# Patient Record
Sex: Male | Born: 1965 | Race: White | Hispanic: No | Smoking: Former smoker
Health system: Southern US, Community
[De-identification: ages and names within clinical notes are randomized; demographics above are authoritative.]

## PROBLEM LIST (undated history)

## (undated) DIAGNOSIS — F419 Anxiety disorder, unspecified: Secondary | ICD-10-CM

## (undated) DIAGNOSIS — K429 Umbilical hernia without obstruction or gangrene: Secondary | ICD-10-CM

## (undated) DIAGNOSIS — F1910 Other psychoactive substance abuse, uncomplicated: Secondary | ICD-10-CM

## (undated) DIAGNOSIS — G4700 Insomnia, unspecified: Secondary | ICD-10-CM

## (undated) DIAGNOSIS — R51 Headache: Secondary | ICD-10-CM

## (undated) DIAGNOSIS — F32A Depression, unspecified: Secondary | ICD-10-CM

## (undated) DIAGNOSIS — E781 Pure hyperglyceridemia: Secondary | ICD-10-CM

## (undated) DIAGNOSIS — K760 Fatty (change of) liver, not elsewhere classified: Secondary | ICD-10-CM

## (undated) DIAGNOSIS — F329 Major depressive disorder, single episode, unspecified: Secondary | ICD-10-CM

## (undated) DIAGNOSIS — R569 Unspecified convulsions: Secondary | ICD-10-CM

## (undated) DIAGNOSIS — K648 Other hemorrhoids: Secondary | ICD-10-CM

## (undated) DIAGNOSIS — F1010 Alcohol abuse, uncomplicated: Secondary | ICD-10-CM

## (undated) DIAGNOSIS — K449 Diaphragmatic hernia without obstruction or gangrene: Secondary | ICD-10-CM

## (undated) DIAGNOSIS — I10 Essential (primary) hypertension: Secondary | ICD-10-CM

## (undated) DIAGNOSIS — K219 Gastro-esophageal reflux disease without esophagitis: Secondary | ICD-10-CM

## (undated) DIAGNOSIS — K589 Irritable bowel syndrome without diarrhea: Secondary | ICD-10-CM

## (undated) DIAGNOSIS — M1990 Unspecified osteoarthritis, unspecified site: Secondary | ICD-10-CM

## (undated) DIAGNOSIS — J45909 Unspecified asthma, uncomplicated: Secondary | ICD-10-CM

## (undated) DIAGNOSIS — R519 Headache, unspecified: Secondary | ICD-10-CM

## (undated) DIAGNOSIS — K852 Alcohol induced acute pancreatitis without necrosis or infection: Secondary | ICD-10-CM

## (undated) DIAGNOSIS — K5790 Diverticulosis of intestine, part unspecified, without perforation or abscess without bleeding: Secondary | ICD-10-CM

## (undated) DIAGNOSIS — R7303 Prediabetes: Principal | ICD-10-CM

## (undated) HISTORY — DX: Umbilical hernia without obstruction or gangrene: K42.9

## (undated) HISTORY — DX: Depression, unspecified: F32.A

## (undated) HISTORY — DX: Anxiety disorder, unspecified: F41.9

## (undated) HISTORY — DX: Other psychoactive substance abuse, uncomplicated: F19.10

## (undated) HISTORY — DX: Alcohol induced acute pancreatitis without necrosis or infection: K85.20

## (undated) HISTORY — DX: Unspecified osteoarthritis, unspecified site: M19.90

## (undated) HISTORY — DX: Unspecified asthma, uncomplicated: J45.909

## (undated) HISTORY — DX: Insomnia, unspecified: G47.00

## (undated) HISTORY — DX: Gastro-esophageal reflux disease without esophagitis: K21.9

## (undated) HISTORY — DX: Irritable bowel syndrome, unspecified: K58.9

## (undated) HISTORY — DX: Headache: R51

## (undated) HISTORY — DX: Headache, unspecified: R51.9

## (undated) HISTORY — DX: Diaphragmatic hernia without obstruction or gangrene: K44.9

## (undated) HISTORY — DX: Fatty (change of) liver, not elsewhere classified: K76.0

## (undated) HISTORY — DX: Other hemorrhoids: K64.8

## (undated) HISTORY — DX: Major depressive disorder, single episode, unspecified: F32.9

## (undated) HISTORY — DX: Prediabetes: R73.03

## (undated) HISTORY — DX: Diverticulosis of intestine, part unspecified, without perforation or abscess without bleeding: K57.90

## (undated) HISTORY — DX: Pure hyperglyceridemia: E78.1

## (undated) HISTORY — DX: Alcohol abuse, uncomplicated: F10.10

---

## 1998-08-09 ENCOUNTER — Inpatient Hospital Stay (HOSPITAL_COMMUNITY): Admission: EM | Admit: 1998-08-09 | Discharge: 1998-08-09 | Payer: Self-pay | Admitting: Emergency Medicine

## 2000-03-11 ENCOUNTER — Emergency Department (HOSPITAL_COMMUNITY): Admission: EM | Admit: 2000-03-11 | Discharge: 2000-03-11 | Payer: Self-pay | Admitting: *Deleted

## 2000-08-27 HISTORY — PX: OTHER SURGICAL HISTORY: SHX169

## 2001-09-05 ENCOUNTER — Encounter (INDEPENDENT_AMBULATORY_CARE_PROVIDER_SITE_OTHER): Payer: Self-pay | Admitting: *Deleted

## 2001-09-05 ENCOUNTER — Encounter: Admission: RE | Admit: 2001-09-05 | Discharge: 2001-09-05 | Payer: Self-pay | Admitting: *Deleted

## 2002-07-20 ENCOUNTER — Encounter: Payer: Self-pay | Admitting: Orthopedic Surgery

## 2002-07-20 ENCOUNTER — Inpatient Hospital Stay (HOSPITAL_COMMUNITY): Admission: EM | Admit: 2002-07-20 | Discharge: 2002-07-23 | Payer: Self-pay | Admitting: *Deleted

## 2002-07-21 ENCOUNTER — Encounter: Payer: Self-pay | Admitting: Specialist

## 2002-08-04 ENCOUNTER — Ambulatory Visit (HOSPITAL_BASED_OUTPATIENT_CLINIC_OR_DEPARTMENT_OTHER): Admission: RE | Admit: 2002-08-04 | Discharge: 2002-08-05 | Payer: Self-pay | Admitting: Orthopedic Surgery

## 2002-08-18 ENCOUNTER — Encounter: Payer: Self-pay | Admitting: Orthopedic Surgery

## 2002-08-18 ENCOUNTER — Ambulatory Visit (HOSPITAL_COMMUNITY): Admission: RE | Admit: 2002-08-18 | Discharge: 2002-08-18 | Payer: Self-pay | Admitting: Orthopedic Surgery

## 2002-08-25 ENCOUNTER — Encounter: Admission: RE | Admit: 2002-08-25 | Discharge: 2002-11-23 | Payer: Self-pay | Admitting: Orthopedic Surgery

## 2003-07-10 ENCOUNTER — Emergency Department (HOSPITAL_COMMUNITY): Admission: EM | Admit: 2003-07-10 | Discharge: 2003-07-11 | Payer: Self-pay | Admitting: Emergency Medicine

## 2003-07-20 ENCOUNTER — Emergency Department (HOSPITAL_COMMUNITY): Admission: AD | Admit: 2003-07-20 | Discharge: 2003-07-20 | Payer: Self-pay | Admitting: Family Medicine

## 2003-07-23 ENCOUNTER — Ambulatory Visit (HOSPITAL_COMMUNITY): Admission: RE | Admit: 2003-07-23 | Discharge: 2003-07-23 | Payer: Self-pay | Admitting: *Deleted

## 2003-07-23 ENCOUNTER — Encounter (INDEPENDENT_AMBULATORY_CARE_PROVIDER_SITE_OTHER): Payer: Self-pay | Admitting: *Deleted

## 2003-09-04 ENCOUNTER — Emergency Department (HOSPITAL_COMMUNITY): Admission: AD | Admit: 2003-09-04 | Discharge: 2003-09-04 | Payer: Self-pay | Admitting: Family Medicine

## 2003-09-27 ENCOUNTER — Encounter: Payer: Self-pay | Admitting: Internal Medicine

## 2003-09-27 ENCOUNTER — Encounter (INDEPENDENT_AMBULATORY_CARE_PROVIDER_SITE_OTHER): Payer: Self-pay | Admitting: *Deleted

## 2003-09-27 ENCOUNTER — Ambulatory Visit (HOSPITAL_COMMUNITY): Admission: RE | Admit: 2003-09-27 | Discharge: 2003-09-27 | Payer: Self-pay | Admitting: Internal Medicine

## 2003-11-01 ENCOUNTER — Emergency Department (HOSPITAL_COMMUNITY): Admission: EM | Admit: 2003-11-01 | Discharge: 2003-11-01 | Payer: Self-pay | Admitting: Family Medicine

## 2003-12-04 ENCOUNTER — Emergency Department (HOSPITAL_COMMUNITY): Admission: AD | Admit: 2003-12-04 | Discharge: 2003-12-04 | Payer: Self-pay | Admitting: Family Medicine

## 2004-01-01 ENCOUNTER — Emergency Department (HOSPITAL_COMMUNITY): Admission: EM | Admit: 2004-01-01 | Discharge: 2004-01-01 | Payer: Self-pay | Admitting: *Deleted

## 2004-01-15 ENCOUNTER — Emergency Department (HOSPITAL_COMMUNITY): Admission: EM | Admit: 2004-01-15 | Discharge: 2004-01-15 | Payer: Self-pay | Admitting: Family Medicine

## 2004-07-19 ENCOUNTER — Emergency Department (HOSPITAL_COMMUNITY): Admission: EM | Admit: 2004-07-19 | Discharge: 2004-07-19 | Payer: Self-pay | Admitting: Family Medicine

## 2004-09-23 ENCOUNTER — Emergency Department (HOSPITAL_COMMUNITY): Admission: EM | Admit: 2004-09-23 | Discharge: 2004-09-23 | Payer: Self-pay | Admitting: Family Medicine

## 2004-09-25 ENCOUNTER — Emergency Department (HOSPITAL_COMMUNITY): Admission: EM | Admit: 2004-09-25 | Discharge: 2004-09-25 | Payer: Self-pay | Admitting: Family Medicine

## 2005-01-20 ENCOUNTER — Emergency Department (HOSPITAL_COMMUNITY): Admission: EM | Admit: 2005-01-20 | Discharge: 2005-01-20 | Payer: Self-pay | Admitting: Emergency Medicine

## 2005-01-20 ENCOUNTER — Encounter (INDEPENDENT_AMBULATORY_CARE_PROVIDER_SITE_OTHER): Payer: Self-pay | Admitting: *Deleted

## 2005-01-20 ENCOUNTER — Ambulatory Visit (HOSPITAL_COMMUNITY): Admission: RE | Admit: 2005-01-20 | Discharge: 2005-01-20 | Payer: Self-pay | Admitting: Emergency Medicine

## 2005-03-11 ENCOUNTER — Emergency Department (HOSPITAL_COMMUNITY): Admission: EM | Admit: 2005-03-11 | Discharge: 2005-03-11 | Payer: Self-pay | Admitting: Family Medicine

## 2005-12-26 ENCOUNTER — Encounter: Payer: Self-pay | Admitting: Orthopedic Surgery

## 2007-04-01 ENCOUNTER — Ambulatory Visit (HOSPITAL_BASED_OUTPATIENT_CLINIC_OR_DEPARTMENT_OTHER): Admission: RE | Admit: 2007-04-01 | Discharge: 2007-04-01 | Payer: Self-pay | Admitting: Orthopedic Surgery

## 2008-03-29 ENCOUNTER — Ambulatory Visit: Payer: Self-pay

## 2008-04-01 ENCOUNTER — Emergency Department (HOSPITAL_COMMUNITY): Admission: EM | Admit: 2008-04-01 | Discharge: 2008-04-01 | Payer: Self-pay | Admitting: *Deleted

## 2008-05-05 ENCOUNTER — Encounter: Payer: Self-pay | Admitting: Internal Medicine

## 2008-05-21 DIAGNOSIS — M1990 Unspecified osteoarthritis, unspecified site: Secondary | ICD-10-CM | POA: Insufficient documentation

## 2008-05-21 DIAGNOSIS — Z8639 Personal history of other endocrine, nutritional and metabolic disease: Secondary | ICD-10-CM | POA: Insufficient documentation

## 2008-05-21 DIAGNOSIS — I129 Hypertensive chronic kidney disease with stage 1 through stage 4 chronic kidney disease, or unspecified chronic kidney disease: Secondary | ICD-10-CM

## 2008-05-21 DIAGNOSIS — Z862 Personal history of diseases of the blood and blood-forming organs and certain disorders involving the immune mechanism: Secondary | ICD-10-CM

## 2008-05-21 DIAGNOSIS — K589 Irritable bowel syndrome without diarrhea: Secondary | ICD-10-CM

## 2008-05-21 DIAGNOSIS — F329 Major depressive disorder, single episode, unspecified: Secondary | ICD-10-CM

## 2008-05-21 DIAGNOSIS — K219 Gastro-esophageal reflux disease without esophagitis: Secondary | ICD-10-CM

## 2008-05-21 DIAGNOSIS — E785 Hyperlipidemia, unspecified: Secondary | ICD-10-CM | POA: Insufficient documentation

## 2008-05-24 ENCOUNTER — Ambulatory Visit: Payer: Self-pay | Admitting: Internal Medicine

## 2008-05-24 DIAGNOSIS — R740 Nonspecific elevation of levels of transaminase and lactic acid dehydrogenase [LDH]: Secondary | ICD-10-CM

## 2008-05-25 ENCOUNTER — Ambulatory Visit: Payer: Self-pay | Admitting: Internal Medicine

## 2008-05-25 DIAGNOSIS — K2900 Acute gastritis without bleeding: Secondary | ICD-10-CM | POA: Insufficient documentation

## 2008-05-26 ENCOUNTER — Encounter: Payer: Self-pay | Admitting: Internal Medicine

## 2008-05-26 LAB — CONVERTED CEMR LAB: UREASE: NEGATIVE

## 2009-01-28 ENCOUNTER — Ambulatory Visit: Payer: Self-pay | Admitting: Internal Medicine

## 2009-01-28 DIAGNOSIS — R1011 Right upper quadrant pain: Secondary | ICD-10-CM

## 2009-01-28 DIAGNOSIS — R112 Nausea with vomiting, unspecified: Secondary | ICD-10-CM | POA: Insufficient documentation

## 2009-01-28 LAB — CONVERTED CEMR LAB
Anti Nuclear Antibody(ANA): NEGATIVE
HCV Ab: NEGATIVE
Hepatitis B Surface Ag: NEGATIVE
INR: 0.9 (ref 0.8–1.0)

## 2009-03-09 ENCOUNTER — Ambulatory Visit: Payer: Self-pay | Admitting: Internal Medicine

## 2010-05-07 ENCOUNTER — Ambulatory Visit: Payer: Self-pay | Admitting: Gastroenterology

## 2010-05-07 ENCOUNTER — Inpatient Hospital Stay (HOSPITAL_COMMUNITY): Admission: EM | Admit: 2010-05-07 | Discharge: 2010-05-09 | Payer: Self-pay | Admitting: Emergency Medicine

## 2010-05-15 ENCOUNTER — Inpatient Hospital Stay (HOSPITAL_COMMUNITY)
Admission: EM | Admit: 2010-05-15 | Discharge: 2010-05-25 | Payer: Self-pay | Source: Home / Self Care | Admitting: Emergency Medicine

## 2010-07-28 ENCOUNTER — Emergency Department (HOSPITAL_COMMUNITY)
Admission: EM | Admit: 2010-07-28 | Discharge: 2010-07-29 | Payer: Self-pay | Source: Home / Self Care | Admitting: Emergency Medicine

## 2010-08-04 ENCOUNTER — Emergency Department (HOSPITAL_COMMUNITY)
Admission: EM | Admit: 2010-08-04 | Discharge: 2010-08-04 | Disposition: A | Discharge status: Other Acute Inpatient Hospital | Payer: Self-pay | Source: Home / Self Care | Admitting: Emergency Medicine

## 2010-08-04 ENCOUNTER — Inpatient Hospital Stay (HOSPITAL_COMMUNITY)
Admission: EM | Admit: 2010-08-04 | Discharge: 2010-08-06 | Payer: Self-pay | Source: Home / Self Care | Attending: Internal Medicine | Admitting: Internal Medicine

## 2010-08-27 DIAGNOSIS — F1010 Alcohol abuse, uncomplicated: Secondary | ICD-10-CM

## 2010-08-27 HISTORY — DX: Alcohol abuse, uncomplicated: F10.10

## 2010-10-19 ENCOUNTER — Emergency Department (HOSPITAL_COMMUNITY)
Admission: EM | Admit: 2010-10-19 | Discharge: 2010-10-20 | Disposition: A | Discharge status: Home / Self Care | Payer: Self-pay | Attending: Emergency Medicine | Admitting: Emergency Medicine

## 2010-10-19 DIAGNOSIS — K029 Dental caries, unspecified: Secondary | ICD-10-CM | POA: Insufficient documentation

## 2010-10-19 DIAGNOSIS — I10 Essential (primary) hypertension: Secondary | ICD-10-CM | POA: Insufficient documentation

## 2010-10-19 DIAGNOSIS — R197 Diarrhea, unspecified: Secondary | ICD-10-CM | POA: Insufficient documentation

## 2010-10-19 DIAGNOSIS — K089 Disorder of teeth and supporting structures, unspecified: Secondary | ICD-10-CM | POA: Insufficient documentation

## 2010-10-19 DIAGNOSIS — R10812 Left upper quadrant abdominal tenderness: Secondary | ICD-10-CM | POA: Insufficient documentation

## 2010-10-19 DIAGNOSIS — R109 Unspecified abdominal pain: Secondary | ICD-10-CM | POA: Insufficient documentation

## 2010-10-19 DIAGNOSIS — R945 Abnormal results of liver function studies: Secondary | ICD-10-CM | POA: Insufficient documentation

## 2010-10-19 DIAGNOSIS — R112 Nausea with vomiting, unspecified: Secondary | ICD-10-CM | POA: Insufficient documentation

## 2010-10-19 DIAGNOSIS — Z79899 Other long term (current) drug therapy: Secondary | ICD-10-CM | POA: Insufficient documentation

## 2010-10-19 DIAGNOSIS — R220 Localized swelling, mass and lump, head: Secondary | ICD-10-CM | POA: Insufficient documentation

## 2010-10-19 DIAGNOSIS — F411 Generalized anxiety disorder: Secondary | ICD-10-CM | POA: Insufficient documentation

## 2010-10-19 DIAGNOSIS — K219 Gastro-esophageal reflux disease without esophagitis: Secondary | ICD-10-CM | POA: Insufficient documentation

## 2010-10-20 LAB — ETHANOL: Alcohol, Ethyl (B): <5 mg/dL (ref 0–10)

## 2010-10-20 LAB — CBC
MCHC: 34.8 g/dL (ref 30.0–36.0)
MCV: 90.4 fL (ref 78.0–100.0)
Platelets: 351 10*3/uL (ref 150–400)
RDW: 12.9 % (ref 11.5–15.5)
WBC: 5.7 10*3/uL (ref 4.0–10.5)

## 2010-10-20 LAB — COMPREHENSIVE METABOLIC PANEL
Albumin: 3.5 g/dL (ref 3.5–5.2)
BUN: 17 mg/dL (ref 6–23)
Calcium: 9 mg/dL (ref 8.4–10.5)
Creatinine, Ser: 1.21 mg/dL (ref 0.4–1.5)
Potassium: 4.1 mEq/L (ref 3.5–5.1)
Total Protein: 6.4 g/dL (ref 6.0–8.3)

## 2010-10-20 LAB — LIPASE, BLOOD: Lipase: 43 U/L (ref 11–59)

## 2010-10-20 LAB — DIFFERENTIAL
Eosinophils Absolute: 0.1 10*3/uL (ref 0.0–0.7)
Eosinophils Relative: 2 % (ref 0–5)
Lymphs Abs: 2.1 10*3/uL (ref 0.7–4.0)
Monocytes Absolute: 0.6 10*3/uL (ref 0.1–1.0)

## 2010-11-06 LAB — DIFFERENTIAL
Basophils Absolute: 0 10*3/uL (ref 0.0–0.1)
Basophils Relative: 1 % (ref 0–1)
Eosinophils Absolute: 0.2 10*3/uL (ref 0.0–0.7)
Eosinophils Relative: 2 % (ref 0–5)
Lymphocytes Relative: 17 % (ref 12–46)
Lymphocytes Relative: 32 % (ref 12–46)
Lymphs Abs: 2.3 10*3/uL (ref 0.7–4.0)
Monocytes Absolute: 0.6 10*3/uL (ref 0.1–1.0)
Monocytes Absolute: 0.8 10*3/uL (ref 0.1–1.0)
Neutro Abs: 3.1 10*3/uL (ref 1.7–7.7)
Neutrophils Relative %: 65 % (ref 43–77)

## 2010-11-06 LAB — COMPREHENSIVE METABOLIC PANEL
ALT: 36 U/L (ref 0–53)
ALT: 704 U/L — ABNORMAL HIGH (ref 0–53)
AST: 1445 U/L — ABNORMAL HIGH (ref 0–37)
AST: 45 U/L — ABNORMAL HIGH (ref 0–37)
Albumin: 3.9 g/dL (ref 3.5–5.2)
Alkaline Phosphatase: 78 U/L (ref 39–117)
BUN: 6 mg/dL (ref 6–23)
BUN: 9 mg/dL (ref 6–23)
CO2: 25 mEq/L (ref 19–32)
CO2: 25 mEq/L (ref 19–32)
CO2: 26 mEq/L (ref 19–32)
CO2: 27 mEq/L (ref 19–32)
Calcium: 8.3 mg/dL — ABNORMAL LOW (ref 8.4–10.5)
Calcium: 9.2 mg/dL (ref 8.4–10.5)
Chloride: 106 mEq/L (ref 96–112)
Chloride: 108 mEq/L (ref 96–112)
Chloride: 108 mEq/L (ref 96–112)
Creatinine, Ser: 1.08 mg/dL (ref 0.4–1.5)
Creatinine, Ser: 1.17 mg/dL (ref 0.4–1.5)
Creatinine, Ser: 1.22 mg/dL (ref 0.4–1.5)
GFR calc Af Amer: >60 mL/min (ref >60)
GFR calc Af Amer: >60 mL/min (ref >60)
GFR calc non Af Amer: >60 mL/min (ref >60)
GFR calc non Af Amer: >60 mL/min (ref >60)
GFR calc non Af Amer: >60 mL/min (ref >60)
Glucose, Bld: 114 mg/dL — ABNORMAL HIGH (ref 70–99)
Potassium: 3.8 mEq/L (ref 3.5–5.1)
Potassium: 4.5 mEq/L (ref 3.5–5.1)
Sodium: 142 mEq/L (ref 135–145)
Sodium: 144 mEq/L (ref 135–145)
Total Bilirubin: 0.6 mg/dL (ref 0.3–1.2)
Total Bilirubin: 1.4 mg/dL — ABNORMAL HIGH (ref 0.3–1.2)
Total Bilirubin: 1.6 mg/dL — ABNORMAL HIGH (ref 0.3–1.2)
Total Protein: 5.6 g/dL — ABNORMAL LOW (ref 6.0–8.3)

## 2010-11-06 LAB — CBC
HCT: 38.6 % — ABNORMAL LOW (ref 39.0–52.0)
HCT: 40.4 % (ref 39.0–52.0)
Hemoglobin: 13.2 g/dL (ref 13.0–17.0)
Hemoglobin: 14.9 g/dL (ref 13.0–17.0)
MCH: 31.4 pg (ref 26.0–34.0)
MCH: 32 pg (ref 26.0–34.0)
MCHC: 34.2 g/dL (ref 30.0–36.0)
MCV: 92 fL (ref 78.0–100.0)
MCV: 93.5 fL (ref 78.0–100.0)
Platelets: 334 10*3/uL (ref 150–400)
Platelets: 339 K/uL (ref 150–400)
RBC: 4.13 MIL/uL — ABNORMAL LOW (ref 4.22–5.81)
RBC: 4.39 MIL/uL (ref 4.22–5.81)
RBC: 4.59 MIL/uL (ref 4.22–5.81)
RDW: 13.7 % (ref 11.5–15.5)
WBC: 4.6 K/uL (ref 4.0–10.5)
WBC: 4.8 10*3/uL (ref 4.0–10.5)

## 2010-11-06 LAB — RAPID URINE DRUG SCREEN, HOSP PERFORMED
Amphetamines: NOT DETECTED
Barbiturates: NOT DETECTED
Tetrahydrocannabinol: NOT DETECTED

## 2010-11-06 LAB — URINALYSIS, ROUTINE W REFLEX MICROSCOPIC
Hgb urine dipstick: NEGATIVE
Nitrite: NEGATIVE
Nitrite: NEGATIVE
Protein, ur: NEGATIVE mg/dL
Specific Gravity, Urine: 1.009 (ref 1.005–1.030)
Urobilinogen, UA: 0.2 mg/dL (ref 0.0–1.0)
Urobilinogen, UA: 1 mg/dL (ref 0.0–1.0)
pH: 7 (ref 5.0–8.0)

## 2010-11-06 LAB — LIPASE, BLOOD
Lipase: 33 U/L (ref 11–59)
Lipase: 43 U/L (ref 11–59)

## 2010-11-06 LAB — MRSA PCR SCREENING: MRSA by PCR: NEGATIVE

## 2010-11-09 LAB — GLUCOSE, CAPILLARY
Glucose-Capillary: 112 mg/dL — ABNORMAL HIGH (ref 70–99)
Glucose-Capillary: 115 mg/dL — ABNORMAL HIGH (ref 70–99)
Glucose-Capillary: 115 mg/dL — ABNORMAL HIGH (ref 70–99)
Glucose-Capillary: 115 mg/dL — ABNORMAL HIGH (ref 70–99)
Glucose-Capillary: 121 mg/dL — ABNORMAL HIGH (ref 70–99)
Glucose-Capillary: 125 mg/dL — ABNORMAL HIGH (ref 70–99)
Glucose-Capillary: 132 mg/dL — ABNORMAL HIGH (ref 70–99)
Glucose-Capillary: 139 mg/dL — ABNORMAL HIGH (ref 70–99)
Glucose-Capillary: 140 mg/dL — ABNORMAL HIGH (ref 70–99)
Glucose-Capillary: 141 mg/dL — ABNORMAL HIGH (ref 70–99)
Glucose-Capillary: 148 mg/dL — ABNORMAL HIGH (ref 70–99)
Glucose-Capillary: 148 mg/dL — ABNORMAL HIGH (ref 70–99)
Glucose-Capillary: 149 mg/dL — ABNORMAL HIGH (ref 70–99)
Glucose-Capillary: 156 mg/dL — ABNORMAL HIGH (ref 70–99)
Glucose-Capillary: 99 mg/dL (ref 70–99)

## 2010-11-09 LAB — CBC
HCT: 29.9 % — ABNORMAL LOW (ref 39.0–52.0)
HCT: 33.7 % — ABNORMAL LOW (ref 39.0–52.0)
HCT: 36.3 % — ABNORMAL LOW (ref 39.0–52.0)
HCT: 36.3 % — ABNORMAL LOW (ref 39.0–52.0)
HCT: 45 % (ref 39.0–52.0)
Hemoglobin: 10 g/dL — ABNORMAL LOW (ref 13.0–17.0)
Hemoglobin: 11.3 g/dL — ABNORMAL LOW (ref 13.0–17.0)
Hemoglobin: 12.3 g/dL — ABNORMAL LOW (ref 13.0–17.0)
Hemoglobin: 14.7 g/dL (ref 13.0–17.0)
Hemoglobin: 12.6 g/dL — ABNORMAL LOW (ref 13.0–17.0)
Hemoglobin: 15.9 g/dL (ref 13.0–17.0)
MCH: 31.7 pg (ref 26.0–34.0)
MCH: 32.1 pg (ref 26.0–34.0)
MCH: 32.3 pg (ref 26.0–34.0)
MCH: 32.6 pg (ref 26.0–34.0)
MCH: 32.7 pg (ref 26.0–34.0)
MCH: 33.1 pg (ref 26.0–34.0)
MCH: 33.2 pg (ref 26.0–34.0)
MCH: 33.2 pg (ref 26.0–34.0)
MCH: 33.5 pg (ref 26.0–34.0)
MCHC: 33 g/dL (ref 30.0–36.0)
MCHC: 33.4 g/dL (ref 30.0–36.0)
MCHC: 33.4 g/dL (ref 30.0–36.0)
MCHC: 33.6 g/dL (ref 30.0–36.0)
MCHC: 33.7 g/dL (ref 30.0–36.0)
MCHC: 33.9 g/dL (ref 30.0–36.0)
MCHC: 35.3 g/dL (ref 30.0–36.0)
MCV: 96.3 fL (ref 78.0–100.0)
MCV: 96.5 fL (ref 78.0–100.0)
MCV: 97.7 fL (ref 78.0–100.0)
MCV: 98.8 fL (ref 78.0–100.0)
MCV: 95 fL (ref 78.0–100.0)
Platelets: 170 10*3/uL (ref 150–400)
Platelets: 201 10*3/uL (ref 150–400)
Platelets: 231 10*3/uL (ref 150–400)
Platelets: 253 10*3/uL (ref 150–400)
Platelets: 255 10*3/uL (ref 150–400)
Platelets: 305 10*3/uL (ref 150–400)
Platelets: 582 10*3/uL — ABNORMAL HIGH (ref 150–400)
Platelets: 714 10*3/uL — ABNORMAL HIGH (ref 150–400)
Platelets: 228 10*3/uL (ref 150–400)
RBC: 3 MIL/uL — ABNORMAL LOW (ref 4.22–5.81)
RBC: 3.22 MIL/uL — ABNORMAL LOW (ref 4.22–5.81)
RBC: 3.41 MIL/uL — ABNORMAL LOW (ref 4.22–5.81)
RBC: 3.43 MIL/uL — ABNORMAL LOW (ref 4.22–5.81)
RBC: 3.6 MIL/uL — ABNORMAL LOW (ref 4.22–5.81)
RBC: 3.76 MIL/uL — ABNORMAL LOW (ref 4.22–5.81)
RBC: 4.42 MIL/uL (ref 4.22–5.81)
RBC: 3.92 MIL/uL — ABNORMAL LOW (ref 4.22–5.81)
RDW: 12.2 % (ref 11.5–15.5)
RDW: 12.3 % (ref 11.5–15.5)
RDW: 12.4 % (ref 11.5–15.5)
RDW: 12.5 % (ref 11.5–15.5)
RDW: 12.5 % (ref 11.5–15.5)
RDW: 12.7 % (ref 11.5–15.5)
RDW: 11.9 % (ref 11.5–15.5)
RDW: 12 % (ref 11.5–15.5)
RDW: 12.1 % (ref 11.5–15.5)
WBC: 12.6 10*3/uL — ABNORMAL HIGH (ref 4.0–10.5)
WBC: 13.4 10*3/uL — ABNORMAL HIGH (ref 4.0–10.5)
WBC: 5.6 10*3/uL (ref 4.0–10.5)
WBC: 7.6 10*3/uL (ref 4.0–10.5)
WBC: 8.7 10*3/uL (ref 4.0–10.5)
WBC: 3.9 10*3/uL — ABNORMAL LOW (ref 4.0–10.5)

## 2010-11-09 LAB — COMPREHENSIVE METABOLIC PANEL
ALT: 101 U/L — ABNORMAL HIGH (ref 0–53)
ALT: 201 U/L — ABNORMAL HIGH (ref 0–53)
AST: 129 U/L — ABNORMAL HIGH (ref 0–37)
AST: 132 U/L — ABNORMAL HIGH (ref 0–37)
AST: 210 U/L — ABNORMAL HIGH (ref 0–37)
AST: 22 U/L (ref 0–37)
AST: 50 U/L — ABNORMAL HIGH (ref 0–37)
AST: 65 U/L — ABNORMAL HIGH (ref 0–37)
AST: 70 U/L — ABNORMAL HIGH (ref 0–37)
AST: 400 U/L — ABNORMAL HIGH (ref 0–37)
Albumin: 2.2 g/dL — ABNORMAL LOW (ref 3.5–5.2)
Albumin: 2.3 g/dL — ABNORMAL LOW (ref 3.5–5.2)
Albumin: 2.4 g/dL — ABNORMAL LOW (ref 3.5–5.2)
Albumin: 2.5 g/dL — ABNORMAL LOW (ref 3.5–5.2)
Albumin: 2.5 g/dL — ABNORMAL LOW (ref 3.5–5.2)
Albumin: 2.8 g/dL — ABNORMAL LOW (ref 3.5–5.2)
Albumin: 3.5 g/dL (ref 3.5–5.2)
Albumin: 3.1 g/dL — ABNORMAL LOW (ref 3.5–5.2)
Alkaline Phosphatase: 140 U/L — ABNORMAL HIGH (ref 39–117)
Alkaline Phosphatase: 195 U/L — ABNORMAL HIGH (ref 39–117)
Alkaline Phosphatase: 76 U/L (ref 39–117)
Alkaline Phosphatase: 90 U/L (ref 39–117)
Alkaline Phosphatase: 94 U/L (ref 39–117)
Alkaline Phosphatase: 94 U/L (ref 39–117)
BUN: 10 mg/dL (ref 6–23)
BUN: 11 mg/dL (ref 6–23)
BUN: 15 mg/dL (ref 6–23)
BUN: 2 mg/dL — ABNORMAL LOW (ref 6–23)
BUN: 3 mg/dL — ABNORMAL LOW (ref 6–23)
BUN: 4 mg/dL — ABNORMAL LOW (ref 6–23)
BUN: 4 mg/dL — ABNORMAL LOW (ref 6–23)
BUN: 9 mg/dL (ref 6–23)
BUN: 10 mg/dL (ref 6–23)
CO2: 26 mEq/L (ref 19–32)
CO2: 28 mEq/L (ref 19–32)
CO2: 31 mEq/L (ref 19–32)
CO2: 32 mEq/L (ref 19–32)
CO2: 32 mEq/L (ref 19–32)
CO2: 28 mEq/L (ref 19–32)
Calcium: 8.3 mg/dL — ABNORMAL LOW (ref 8.4–10.5)
Calcium: 8.5 mg/dL (ref 8.4–10.5)
Calcium: 8.9 mg/dL (ref 8.4–10.5)
Calcium: 8.9 mg/dL (ref 8.4–10.5)
Calcium: 8.9 mg/dL (ref 8.4–10.5)
Calcium: 8.5 mg/dL (ref 8.4–10.5)
Chloride: 101 mEq/L (ref 96–112)
Chloride: 101 mEq/L (ref 96–112)
Chloride: 106 mEq/L (ref 96–112)
Chloride: 107 mEq/L (ref 96–112)
Chloride: 107 mEq/L (ref 96–112)
Chloride: 99 mEq/L (ref 96–112)
Chloride: 99 mEq/L (ref 96–112)
Creatinine, Ser: 0.59 mg/dL (ref 0.4–1.5)
Creatinine, Ser: 0.61 mg/dL (ref 0.4–1.5)
Creatinine, Ser: 0.62 mg/dL (ref 0.4–1.5)
Creatinine, Ser: 0.68 mg/dL (ref 0.4–1.5)
Creatinine, Ser: 0.72 mg/dL (ref 0.4–1.5)
Creatinine, Ser: 0.73 mg/dL (ref 0.4–1.5)
Creatinine, Ser: 0.79 mg/dL (ref 0.4–1.5)
Creatinine, Ser: 0.81 mg/dL (ref 0.4–1.5)
Creatinine, Ser: 1.1 mg/dL (ref 0.4–1.5)
GFR calc Af Amer: >60 mL/min (ref >60)
GFR calc Af Amer: >60 mL/min (ref >60)
GFR calc Af Amer: >60 mL/min (ref >60)
GFR calc Af Amer: >60 mL/min (ref >60)
GFR calc Af Amer: >60 mL/min (ref >60)
GFR calc Af Amer: >60 mL/min (ref >60)
GFR calc Af Amer: >60 mL/min (ref >60)
GFR calc non Af Amer: >60 mL/min (ref >60)
GFR calc non Af Amer: >60 mL/min (ref >60)
GFR calc non Af Amer: >60 mL/min (ref >60)
GFR calc non Af Amer: >60 mL/min (ref >60)
GFR calc non Af Amer: >60 mL/min (ref >60)
GFR calc non Af Amer: >60 mL/min (ref >60)
Glucose, Bld: 107 mg/dL — ABNORMAL HIGH (ref 70–99)
Glucose, Bld: 122 mg/dL — ABNORMAL HIGH (ref 70–99)
Glucose, Bld: 129 mg/dL — ABNORMAL HIGH (ref 70–99)
Glucose, Bld: 144 mg/dL — ABNORMAL HIGH (ref 70–99)
Potassium: 2.9 mEq/L — ABNORMAL LOW (ref 3.5–5.1)
Potassium: 3.6 mEq/L (ref 3.5–5.1)
Potassium: 3.8 mEq/L (ref 3.5–5.1)
Potassium: 3.8 mEq/L (ref 3.5–5.1)
Potassium: 3.9 mEq/L (ref 3.5–5.1)
Potassium: 3.9 mEq/L (ref 3.5–5.1)
Potassium: 3.6 mEq/L (ref 3.5–5.1)
Sodium: 141 mEq/L (ref 135–145)
Sodium: 136 mEq/L (ref 135–145)
Total Bilirubin: 0.3 mg/dL (ref 0.3–1.2)
Total Bilirubin: 0.6 mg/dL (ref 0.3–1.2)
Total Bilirubin: 0.8 mg/dL (ref 0.3–1.2)
Total Bilirubin: 0.9 mg/dL (ref 0.3–1.2)
Total Bilirubin: 1.2 mg/dL (ref 0.3–1.2)
Total Bilirubin: 1.5 mg/dL — ABNORMAL HIGH (ref 0.3–1.2)
Total Bilirubin: 2 mg/dL — ABNORMAL HIGH (ref 0.3–1.2)
Total Protein: 5.3 g/dL — ABNORMAL LOW (ref 6.0–8.3)
Total Protein: 5.3 g/dL — ABNORMAL LOW (ref 6.0–8.3)
Total Protein: 5.5 g/dL — ABNORMAL LOW (ref 6.0–8.3)
Total Protein: 5.6 g/dL — ABNORMAL LOW (ref 6.0–8.3)
Total Protein: 5.7 g/dL — ABNORMAL LOW (ref 6.0–8.3)
Total Protein: 5.8 g/dL — ABNORMAL LOW (ref 6.0–8.3)
Total Protein: 5.6 g/dL — ABNORMAL LOW (ref 6.0–8.3)
Total Protein: 5.8 g/dL — ABNORMAL LOW (ref 6.0–8.3)

## 2010-11-09 LAB — DIFFERENTIAL
Basophils Absolute: 0 10*3/uL (ref 0.0–0.1)
Basophils Relative: 0 % (ref 0–1)
Basophils Relative: 1 % (ref 0–1)
Lymphocytes Relative: 10 % — ABNORMAL LOW (ref 12–46)
Lymphocytes Relative: 17 % (ref 12–46)
Lymphs Abs: 1.5 10*3/uL (ref 0.7–4.0)
Lymphs Abs: 1.7 10*3/uL (ref 0.7–4.0)
Monocytes Relative: 14 % — ABNORMAL HIGH (ref 3–12)
Neutro Abs: 3 10*3/uL (ref 1.7–7.7)
Neutro Abs: 6.8 10*3/uL (ref 1.7–7.7)
Neutrophils Relative %: 54 % (ref 43–77)
Neutrophils Relative %: 63 % (ref 43–77)
Neutrophils Relative %: 74 % (ref 43–77)

## 2010-11-09 LAB — CULTURE, BLOOD (ROUTINE X 2)
Culture  Setup Time: 201109201428
Culture: NO GROWTH
Culture: NO GROWTH

## 2010-11-09 LAB — LIPASE, BLOOD
Lipase: 43 U/L (ref 11–59)
Lipase: 56 U/L (ref 11–59)
Lipase: 53 U/L (ref 11–59)

## 2010-11-09 LAB — POCT I-STAT, CHEM 8
Chloride: 105 mEq/L (ref 96–112)
Creatinine, Ser: 1.5 mg/dL (ref 0.4–1.5)
Glucose, Bld: 137 mg/dL — ABNORMAL HIGH (ref 70–99)
HCT: 45 % (ref 39.0–52.0)
Potassium: 3.9 mEq/L (ref 3.5–5.1)

## 2010-11-09 LAB — URINALYSIS, MICROSCOPIC ONLY
Ketones, ur: 40 mg/dL — AB
Leukocytes, UA: NEGATIVE
Nitrite: NEGATIVE
Protein, ur: NEGATIVE mg/dL
Urobilinogen, UA: 1 mg/dL (ref 0.0–1.0)
pH: 5 (ref 5.0–8.0)

## 2010-11-09 LAB — COXSACKIE B VIRUS ANTIBODIES
Coxsackie B3 Ab: <1:8 {titer}
Coxsackie B4 Ab: <1:8 {titer}
Coxsackie B5 Ab: <1:8 {titer}

## 2010-11-09 LAB — URINE CULTURE
Colony Count: NO GROWTH
Culture  Setup Time: 201109200910
Culture: NO GROWTH

## 2010-11-09 LAB — RAPID URINE DRUG SCREEN, HOSP PERFORMED
Barbiturates: NOT DETECTED
Benzodiazepines: POSITIVE — AB
Cocaine: NOT DETECTED

## 2010-11-09 LAB — CMV ANTIBODY, IGG (EIA): CMV Ab - IgG: >6 — ABNORMAL HIGH (ref <0.90)

## 2010-11-09 LAB — LIPID PANEL
LDL Cholesterol: 34 mg/dL (ref 0–99)
Total CHOL/HDL Ratio: 2.2 RATIO
Triglycerides: 191 mg/dL — ABNORMAL HIGH (ref <150)
VLDL: 38 mg/dL (ref 0–40)

## 2010-11-09 LAB — HEPATITIS PANEL, ACUTE
HCV Ab: NEGATIVE
Hep A IgM: NEGATIVE
Hep B C IgM: NEGATIVE
Hepatitis B Surface Ag: NEGATIVE

## 2010-11-09 LAB — PHOSPHORUS
Phosphorus: 2.9 mg/dL (ref 2.3–4.6)
Phosphorus: 3.8 mg/dL (ref 2.3–4.6)
Phosphorus: 4.3 mg/dL (ref 2.3–4.6)

## 2010-11-09 LAB — TISSUE TRANSGLUTAMINASE, IGG: Tissue Transglut Ab: 10.3 U/mL (ref <20)

## 2010-11-09 LAB — DRUG SCREEN PANEL (SERUM)

## 2010-11-09 LAB — EBV AB TO VIRAL CAPSID AG PNL, IGG+IGM
EBV VCA IgG: 3.32 {ISR} — ABNORMAL HIGH
EBV VCA IgM: 0.58 {ISR}

## 2010-11-09 LAB — PREALBUMIN: Prealbumin: 8.6 mg/dL — ABNORMAL LOW (ref 18.0–45.0)

## 2010-11-09 LAB — URINALYSIS, ROUTINE W REFLEX MICROSCOPIC
Hgb urine dipstick: NEGATIVE
Nitrite: NEGATIVE
Protein, ur: NEGATIVE mg/dL
Specific Gravity, Urine: 1.021 (ref 1.005–1.030)
Urobilinogen, UA: 1 mg/dL (ref 0.0–1.0)

## 2010-11-09 LAB — HEPATIC FUNCTION PANEL
Albumin: 3.9 g/dL (ref 3.5–5.2)
Alkaline Phosphatase: 87 U/L (ref 39–117)
Bilirubin, Direct: 0.3 mg/dL (ref 0.0–0.3)
Indirect Bilirubin: 0.8 mg/dL (ref 0.3–0.9)
Total Bilirubin: 1.1 mg/dL (ref 0.3–1.2)

## 2010-11-09 LAB — BASIC METABOLIC PANEL
BUN: 23 mg/dL (ref 6–23)
CO2: 26 mEq/L (ref 19–32)
GFR calc non Af Amer: >60 mL/min (ref >60)
Glucose, Bld: 112 mg/dL — ABNORMAL HIGH (ref 70–99)
Potassium: 4.1 mEq/L (ref 3.5–5.1)
Sodium: 138 mEq/L (ref 135–145)

## 2010-11-09 LAB — MAGNESIUM
Magnesium: 2.1 mg/dL (ref 1.5–2.5)
Magnesium: 2.5 mg/dL (ref 1.5–2.5)

## 2010-11-09 LAB — CHOLESTEROL, TOTAL
Cholesterol: 133 mg/dL (ref 0–200)
Cholesterol: 141 mg/dL (ref 0–200)

## 2010-11-09 LAB — PROTIME-INR: Prothrombin Time: 13.9 seconds (ref 11.6–15.2)

## 2010-11-09 LAB — ACETAMINOPHEN LEVEL: Acetaminophen (Tylenol), Serum: <10 ug/mL — ABNORMAL LOW (ref 10–30)

## 2010-11-09 LAB — CMV IGM: CMV IgM: 0.67 (ref <0.90)

## 2010-11-09 LAB — TRIGLYCERIDES: Triglycerides: 171 mg/dL — ABNORMAL HIGH (ref <150)

## 2010-11-09 LAB — ETHANOL: Alcohol, Ethyl (B): 139 mg/dL — ABNORMAL HIGH (ref 0–10)

## 2010-11-09 LAB — COXSACKIE A VIRUS ANTIBODIES

## 2010-11-09 LAB — MRSA PCR SCREENING
MRSA by PCR: NEGATIVE
MRSA by PCR: NEGATIVE

## 2010-11-09 LAB — ENDOMYSIAL IGA ANTIBODY: Endomysial IgA Autoabs: NEGATIVE

## 2010-12-25 ENCOUNTER — Emergency Department (HOSPITAL_COMMUNITY)
Admission: EM | Admit: 2010-12-25 | Discharge: 2010-12-25 | Disposition: A | Discharge status: Home / Self Care | Payer: Self-pay | Attending: Emergency Medicine | Admitting: Emergency Medicine

## 2010-12-25 ENCOUNTER — Emergency Department (HOSPITAL_COMMUNITY): Payer: Self-pay

## 2010-12-25 ENCOUNTER — Encounter (HOSPITAL_COMMUNITY): Payer: Self-pay | Admitting: Radiology

## 2010-12-25 DIAGNOSIS — K219 Gastro-esophageal reflux disease without esophagitis: Secondary | ICD-10-CM | POA: Insufficient documentation

## 2010-12-25 DIAGNOSIS — R197 Diarrhea, unspecified: Secondary | ICD-10-CM | POA: Insufficient documentation

## 2010-12-25 DIAGNOSIS — R509 Fever, unspecified: Secondary | ICD-10-CM | POA: Insufficient documentation

## 2010-12-25 DIAGNOSIS — Z79899 Other long term (current) drug therapy: Secondary | ICD-10-CM | POA: Insufficient documentation

## 2010-12-25 DIAGNOSIS — Z8711 Personal history of peptic ulcer disease: Secondary | ICD-10-CM | POA: Insufficient documentation

## 2010-12-25 DIAGNOSIS — R1032 Left lower quadrant pain: Secondary | ICD-10-CM | POA: Insufficient documentation

## 2010-12-25 DIAGNOSIS — I10 Essential (primary) hypertension: Secondary | ICD-10-CM | POA: Insufficient documentation

## 2010-12-25 DIAGNOSIS — R110 Nausea: Secondary | ICD-10-CM | POA: Insufficient documentation

## 2010-12-25 DIAGNOSIS — R000 Tachycardia, unspecified: Secondary | ICD-10-CM | POA: Insufficient documentation

## 2010-12-25 HISTORY — DX: Essential (primary) hypertension: I10

## 2010-12-25 LAB — COMPREHENSIVE METABOLIC PANEL
Albumin: 4.1 g/dL (ref 3.5–5.2)
Alkaline Phosphatase: 67 U/L (ref 39–117)
BUN: 10 mg/dL (ref 6–23)
Calcium: 9.8 mg/dL (ref 8.4–10.5)
Potassium: 4.4 mEq/L (ref 3.5–5.1)
Total Protein: 7.4 g/dL (ref 6.0–8.3)

## 2010-12-25 LAB — URINALYSIS, ROUTINE W REFLEX MICROSCOPIC
Glucose, UA: NEGATIVE mg/dL
Hgb urine dipstick: NEGATIVE
Specific Gravity, Urine: 1.011 (ref 1.005–1.030)
pH: 6 (ref 5.0–8.0)

## 2010-12-25 LAB — DIFFERENTIAL
Basophils Absolute: 0 10*3/uL (ref 0.0–0.1)
Eosinophils Absolute: 0.1 10*3/uL (ref 0.0–0.7)
Eosinophils Relative: 1 % (ref 0–5)
Lymphs Abs: 1.4 10*3/uL (ref 0.7–4.0)

## 2010-12-25 LAB — CBC
MCHC: 34.2 g/dL (ref 30.0–36.0)
MCV: 93.3 fL (ref 78.0–100.0)
Platelets: 333 10*3/uL (ref 150–400)
RDW: 13.6 % (ref 11.5–15.5)
WBC: 6.1 10*3/uL (ref 4.0–10.5)

## 2010-12-25 MED ORDER — IOHEXOL 300 MG/ML  SOLN
100.0000 mL | Freq: Once | INTRAMUSCULAR | Status: AC | PRN
  Administered 2010-12-25: 100 mL via INTRAVENOUS

## 2010-12-26 LAB — LIPASE, BLOOD: Lipase: 51 U/L (ref 11–59)

## 2011-01-09 NOTE — Op Note (Signed)
Martin Singh, Martin Singh                 ACCOUNT NO.:  [id number]   MEDICAL RECORD NO.:  [id number]          PATIENT TYPE:  AMB   LOCATION:  DSC                          FACILITY:  [facility]   PHYSICIAN:  Leonides Grills, M.D.     DATE OF BIRTH:  1966-06-13   DATE OF PROCEDURE:  04/01/2007  DATE OF DISCHARGE:                               OPERATIVE REPORT   PREOPERATIVE DIAGNOSIS:  Complication of hardware right leg,   POSTOPERATIVE DIAGNOSIS:  Complication of hardware right leg,   OPERATION:  1. Hardware removal deep right leg.  2. Stress x-rays right leg.   ANESTHESIA:  General.   SURGEON:  Leonides Grills,  MD.   ASSISTANT:  Evlyn Kanner, PA-C.   ESTIMATED BLOOD LOSS:  Minimal.   TOURNIQUET:  None.   COMPLICATIONS:  None.   DISPOSITION:  Stable to PR.   INDICATIONS:  This is a 45 year old male who sustained a severely  comminuted right tibia fracture with Pilon extension. This was fixed  with open reduction and internal fixation several years ago.  He is now  complaining of painful hardware and presents for hardware removal.  He  was consented for the above procedure.  All risks including infection,  neurovessel injury, tibial nonunion, persistent pain, worsening pain,  prolonged recovery, stiffness and arthritis, wound complications were  all explained. Questions were encouraged and answered.   DESCRIPTION OF PROCEDURE:  The patient was brought to the operating  room, placed in supine position and after adequate general endotracheal  anesthesia was administered as well as Ancef 1 gram IV piggyback, the  right lower extremity was prepped and draped in a sterile manner over a  proximally placed thigh tourniquet which was not used. Using C-arm  guidance, the screws were identified and marked off on the skin.  A  longitudinal incision was then made both proximally and distally about  the plate. Screws were removed proximately and distally with two locking  screws distally having a  hardware failure at the screw head. Both screw  shanks were left within the bone were they were not proud. There was a  cannulated screw distally, it was removed as well.  The plate was then  removed carefully between the two incisions elevating it off with the  bone and releasing the soft tissue. Once this was removed, the area was  copiously irrigated with normal saline. There was no palpable spurs  through the skin. The scar tissue was removed.  Once the area was  copiously irrigated with normal saline, there was no pulsatile bleeding,  hemostasis was obtained. Stress x-rays were obtained and showed no gross  motion across the fracture site,  two screws in the distal aspect of the tibia were left, no other  abnormalities. The subcu was closed with 3-0 Vicryl, skin was closed  with 4-0 nylon.  A sterile dressing was applied. A Cam walker boot was  applied.  The patient stable to the PR.      Leonides Grills, M.D.  Electronically Signed     PB/MEDQ  D:  04/01/2007  T:  04/01/2007  Job:  [id number]

## 2011-01-12 NOTE — Op Note (Signed)
NAME:  MARVELOUS, BOUWENS                           ACCOUNT NO.:  [id number]   MEDICAL RECORD NO.:  [id number]                   PATIENT TYPE:  AMB   LOCATION:  DSC                                  FACILITY:  [facility]   PHYSICIAN:  Leonides Grills, M.D.                  DATE OF BIRTH:  Jan 02, 1966   DATE OF PROCEDURE:  08/04/2002  DATE OF DISCHARGE:                                 OPERATIVE REPORT   PREOPERATIVE DIAGNOSIS:  Right open pilon fracture status post external  fixator application and open reduction and internal fixation of right  fibular fracture.   POSTOPERATIVE DIAGNOSIS:  Right open pilon fracture status post external  fixator application and open reduction and internal fixation of right  fibular fracture.   OPERATION:  1. Open reduction and internal fixation of right pilon fracture.  2. External fixator removal deep, right ankle.  3. Right iliac crest bone graft.   ANESTHESIA:  General endotracheal anesthesia.   SURGEON:  Leonides Grills, M.D.   ASSISTANT:  Lianne Cure, P.A.-C.   ESTIMATED BLOOD LOSS:  Minimal.   TOURNIQUET TIME:  None.   COMPLICATIONS:  None.   DISPOSITION:  Stable to the PACU.   INDICATIONS FOR PROCEDURE:  This is a 45 year old male who sustained a right  open pilon fracture.  He was initially fixed with an external fixator as  well as ORIF of his fibula by Dr. Rennis Chris.  He had a clean wound.  He had a  small piece of bone that was completely devitalized that was removed at the  index procedure.  He has been keeping this elevated and has had no  complications postoperatively.  After elevation was done for two weeks and  skin looked adequate for fixation, he was taken to the OR today.  He was  consented for the above procedure.  All risks which include infection,  neurovascular injury, malunion, nonunion, hardware failure, arthritis,  stiffness, possible future surgery which include ankle fusion, debridements,  wound problems with possible wound  coverage, possible future bone graft,  were all explained, questions were encouraged and answered.   OPERATION:  The patient was brought to the operating room and placed in  supine position.  After adequate general endotracheal anesthesia was  administered as well as Ancef 1 gram IV piggyback, the right lower extremity  as well as the iliac crest bone graft site were prepped and draped in a  sterile manner over a proximally placed thigh tourniquet.  The procedure  commenced with harvesting the bone graft.  A longitudinal incision over the  right iliac crest was made.  Dissection was carried down to the crest.  Soft  tissues were elevated off the superior aspect of the crest.  A cortical  window was then created with the hatch.  The bone was then removed with the  aid of a  curet and placed on the  back table.  We mixed cancellous allograft  chips with this, as well.  Once this was done, the wound was copiously  irrigated with normal saline.  The patch was then repaired with 0 Vicryl  suture through drill holes.  Deep soft tissues were closed with 0 Vicryl,  the subcu was closed with 2-0 Vicryl, the skin was closed with 4-0 Monocryl  subcuticular stitch.  Steri-Strips was applied.  Marcaine was instilled into  the wound without epinephrine.   We then, under C-arm guidance, observed the fracture site.  A small 3 cm  wound was then made over the medial aspect of the tibia and in the vicinity  of the medial malleolus.  A K-wire was then placed and a cannulated drill  was placed and used as a joy stick to reduce the fracture that was found to  be displaced on CT scan.  Once this was redirected with the aid of an AO  elevator proximally through the fracture site and reduced under C-arm  guidance, a 4.0 mm cannulated screw was then placed once reduction clamps  were applied both medially and laterally.  Laterally, it was done through a  stab wound.  After this was reduced anatomically under C-arm  guidance in the  AP and lateral  planes as well as mortise planes, we then created the  channel for a 12 hole precontoured medial tibial Synthes locking plate.  We  then slid the plate under C-arm guidance in the AP and lateral planes.  Once  this was in excellent position, we then placed two locking screws distally  and then one proximally.  This held the plate in an excellent position as  well as a fracture site in excellent position in the AP and lateral planes.  We then removed the external fixator and then placed two percutaneous screws  through stab wounds proximally within the plate.  Once this was done, we  also made an incision over the previous open fracture site where the bone  was most efficient where the graft was going to be placed.  We then, without  disrupting any of the bony pieces or blood supply to these pieces  respectively, applied the bone graft to this area liberally as well as  underneath the plate that spanned and bridged this area.  We then placed the  remaining screw holes distally as locking screws, as well.  Four total.  All  screws were placed in a standard AO locking plate technique.  Final x-rays  in the AP and lateral views showed excellent alignment and placement of the  fixation.  The wounds were copiously irrigated with normal saline.  The  subcu was closed with 3-0 Vicryl, the skin was closed with 4-0 nylon over  all wounds.  Sterile dressing was applied, modified Jones dressing was  applied with the ankle in neutral dorsiflexion.                                               Leonides Grills, M.D.    PB/MEDQ  D:  08/04/2002  T:  08/04/2002  Job:  [id number]

## 2011-01-12 NOTE — Op Note (Signed)
NAMECONARD, ALVIRA                             ACCOUNT NO.:  [id number]   MEDICAL RECORD NO.:  [id number]                   PATIENT TYPE:  INP   LOCATION:  1843                                 FACILITY:  [facility]   PHYSICIAN:  Vania Rea. Supple, M.D.               DATE OF BIRTH:  [birth year]   DATE OF PROCEDURE:  07/20/2002  DATE OF DISCHARGE:                                 OPERATIVE REPORT   PREOPERATIVE DIAGNOSIS:  Comminuted intra-articular left distal tibia  fracture, with associated fibular fracture, Grade II open.   POSTOPERATIVE DIAGNOSIS:  Comminuted intra-articular left distal tibia  fracture, with associated fibular fracture, Grade II open.   PROCEDURE:  1. Exploration with irrigation and debridement of Grade II open right distal     tibia fracture, with intra-articular extension.  2. Open reduction, internal fixation of displaced fibular shaft fracture.  3. External fixation of comminuted intra-articular segmental fracture of the     distal tibia.   SURGEON:  Vania Rea. Supple, M.D.   Threasa HeadsFrench Ana A. Shuford, P.A.-C.   ANESTHESIA:  General endotracheal.   TOURNIQUET TIME:  Approximately 30 minutes.   ESTIMATED BLOOD LOSS:  200 cc.   DRAINS:  None.   HISTORY:  Mr. 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  He had  immediate complaints of pain, deformity and inability to bear weight on the  right lower extremity.  He also sustained an open wound over the medial  aspect of the leg.  On presentation to the Emergency Room he was alert and  oriented, with no complaints of head or neck pain, or any complaints of back  pain.  Neurologic examination was nonfocal.  Orthopedic examination of the  right lower extremity showed an obvious deformity of the leg, foot and  ankle, with an open wound approximately 2 cm. in length and an area of very  tenuous skin over the anterior medial aspect of  the distal leg.  He did have  brisk capillary refill and was grossly intact to light touch and sensation,  but palpable pulses were not appreciated.  His radiographs showed a highly  comminuted and segmental fracture of the distal tibia with intra-articular  extension and mild displacement of intra-articular fragments.  In addition,  there was a transverse fracture of the mid to distal third junction of the  fibula.  Due to the severity of the fracture and the open wounds he was  brought to the Operating Room at this time on an emergent basis for the  below outlined procedure.   Preoperatively, he was counseled on treatment options, as well as questions  explained thorough.  The possible long term complications of bleeding,  infection, neurovascular injury, persistent pain and loss of motion,  malunion, nonunion, loss, fixation, posttraumatic arthritis and possible  need for additional surgery were all reviewed.  He understands and accepts  and agrees with the planned procedure.   PROCEDURE IN DETAIL:  After undergoing routine preoperative evaluation, the  patient did received antibiotics, both Ancef and gentamicin, and tetanus up-  to-date.  He was brought to the Operating Room and placed upon the operating  table and then went through the motion of general endotracheal anesthesia.  Tourniquet was applied to the right thigh.  A Foley catheter was placed.  The right lower extremity was then thoroughly prepped and draped in a  sterile fashion.  The procedure was started with an extension of the  traumatic wound proximally and distally, approximately 2-3 cm. in both  directions.  This allowed visualization of the medially subjacent to tibial  bone and this was noted to be highly comminuted.  I noticed a segment of  bone approximately 1 x 3 cm. in length that was completed devascularized.  This fragment of bone, having been in the contaminated wound, was excised.  The wound itself was  explored and copiously irrigated with saline.  Digital  palpation of the fracture site through the open wound confirmed that this  was a highly comminuted fracture of the distal tibia.  Once the I&D was  completed, we turned our direction to the lateral aspect of the leg.  The  leg was elevated and exsanguinated with the tourniquet inflated to 350 mmHg.  A longitudinal 10 cm. incision was then made over the distal fibular shaft.  Sharp dissection was carried down to the deep fascia.  The peroneal  musculature was then reflected posteriorly.  This allowed exposure of the  distal fibula at the fracture site and there was noted to have a marked  foreshortening due to the fracture.  In addition, palpation of the proximal  segment of the fibula showed there to be significant mobility of the fibula  shaft proximally and on further inspection we did find that there was  evidence for some disruption of the proximal tibular/fibula joint.  Fluoroscopic images were brought in and this was used to confirm that there  was a comminuted and slightly impacted fracture of the tibular neck.  We did  confirm, however, that prior to the case his EHL function was intact.  Once  the proximal tibial fracture was identified, we then returned our attention  to the fibula shaft, where under direct visualization we cleaned the  fracture site and performed a direct reduction, pinning anatomic alignment.  We then contoured a seven-hole 3.5 Dynamic compression plate to fit over the  posterior lateral cortex of the fibula.  This was then fastened with 3.5  cortical screws proximally and distally.  Excellent bony purchase was  achieved.  Fluoroscopic images were then used to confirm appropriate  alignment at the fracture site and good positions of hardware.  At this  point, the tourniquet was then let down.  The lateral wound was packed. Attention was then directed to the right foot, where under fluoroscopic  guidance, we  placed two threaded Hare pins that were threaded over their  midsection, through both the tailor neck and the calcaneous.  This was  performed under fluoroscopic guidance.  These pins were then placed and  fixed to a transfixing bar.  We then placed two-footed Hare pins along the  anterior medial cortex of the tibia proximally and then linked the threaded  Hare pins proximally to the threaded Hare pins passed through the talus and  calcaneous  distally.  We made a triangular construct.  Then, using  fluoroscopic guidance we appropriately reduced the fracture site and  tightened all the fixtures.  We had to make several additional adjustments  based on fluoroscopic images and finally obtained an appropriate overall  alignment.  There was, however, marked comminution of the tibial fracture  and my suspicious is that he will likely need some type of bone grafting  procedure in the future.  However, close inspection of the tibial surface  showed overall good alignment.  At this point, the external fixator was  terminally tightened.  All the pins were then cleaned.  We used a 15 blade  to create a relaxing incision so that there was no excessive tension over  the pin sites.  Adaptive was placed over the pin sites.  The surgical  incision medially was closed with 3-0 nylon and the surgical incision  laterally was closed with 0 Vicryl and staples.  A bulky dry dressing was  wrapped about the foot, ankle and leg.  The patient was then extubated and  taken to the Recovery Room in stable condition.                                               Vania Rea. Supple, M.D.    KMS/MEDQ  D:  07/20/2002  T:  07/20/2002  Job:  [id number]

## 2011-01-12 NOTE — Discharge Summary (Signed)
Martin Singh, Martin Singh                           ACCOUNT NO.:  [id number]   MEDICAL RECORD NO.:  [id number]                   PATIENT TYPE:  INP   LOCATION:  5017                                 FACILITY:  [facility]   PHYSICIAN:  Vania Rea. Supple, M.D.               DATE OF BIRTH:  Sep 23, 1965   DATE OF ADMISSION:  07/20/2002  DATE OF DISCHARGE:  07/23/2002                                 DISCHARGE SUMMARY   ADMISSION DIAGNOSES:  1. Right open grade 2 tibia-fibula fracture.  2. Smoking history.   DISCHARGE DIAGNOSES:  1. Right open grade 2 tibia-fibula fracture.  2. Smoking history.  3. Incision and drainage and open reduction and internal fixation and     application of external fixator to right tibia-fibula.   OPERATIONS:  Open reduction and internal fixation and application of  external fixator of right tibia-fibula with incision and drainage of right  tibia-fibula; surgeon Dr. Rennis Chris and assistant Ralene Bathe, P.A.-C. under  general anesthesia.   BRIEF HISTORY:  The patient is a 45 year old male who fell approximately 20  feet from a ladder sustaining a single injury to the right lower extremity.  He had obvious open fracture and deformity.  He had no loss of consciousness  or other complaints.  He received his tetanus shot, as well as preoperative  IV Ancef.  X-rays show a displaced comminuted grade 2 open tibia-fibula  fracture with a proximal fibula fracture as well.  Operative intervention  was indicated and risks and benefits discussed and he wished to proceed.   HOSPITAL COURSE:  The patient was admitted and underwent the above-named  procedure and tolerated this well.  All appropriate IV antibiotics and  analgesics were utilized.  Postoperatively, he was placed on Ancef and  gentamycin.  The patient did have significant bone destruction, therefore we  asked for consult with Dr. Lestine Box, foot and ankle specialist.  All-in-all,  the patient's skin was viable.  His open  portion did not show any signs of  infection.  His pain was controlled and he was tolerating being up  ambulating with his walker and crutches nonweightbearing.  Due to the  significant injury, it was felt that he was safe to be discharged, follow up  on an outpatient basis, and then would require a readmission for repeat  ORIF, percutaneous reduction and fixation for the fracture by Dr. Lestine Box.  On July 23, 2002, he was afebrile.  His incision was clean and dry, he  was neurovascularly intact, and he was tolerating p.o. analgesics.  He  completed his course of IV antibiotics.  He was stable for discharge to  home.   LABORATORY DATA:  Admission labs with a blood gas showing pH 7.25, PCO2 63,  PO2 normal at 94.  Routine chemistry of sodium and potassium 138 and 4.3,  respectively.  Hemoglobin 14.   X-rays showed postreduction and fixation alignment  of right distal fibular  fracture and distal tibia.  CT scan done in the chart as well with bone  windows.  See CT report for details.   CONDITION ON DISCHARGE:  Stable and improved.   DISCHARGE MEDICATIONS AND PLANS:  1. The patient is being discharged to home.  He is on:     a. An aspirin a day.     b. Robaxin 500 mg 1 every eight p.r.n. spasm.     c. Percocet 1-2 every 4-6 p.r.n. pain.     d. Keflex 500 mg 1 q.i.d.  2. He is nonweightbearing to right lower extremity.  3. Daily dressing have been taught to him.  4. Follow up Monday with Dr. Lestine Box in the office.  5. Resume a home diet and home medications.     Tracy A. Shuford, P.A.-C.                 Vania Rea. Supple, M.D.    TAS/MEDQ  D:  09/04/2002  T:  09/05/2002  Job:  [id number]

## 2011-02-27 ENCOUNTER — Ambulatory Visit (INDEPENDENT_AMBULATORY_CARE_PROVIDER_SITE_OTHER): Payer: Self-pay | Admitting: Internal Medicine

## 2011-02-27 ENCOUNTER — Encounter: Payer: Self-pay | Admitting: Internal Medicine

## 2011-02-27 DIAGNOSIS — K589 Irritable bowel syndrome without diarrhea: Secondary | ICD-10-CM

## 2011-02-27 DIAGNOSIS — F411 Generalized anxiety disorder: Secondary | ICD-10-CM

## 2011-02-27 DIAGNOSIS — R141 Gas pain: Secondary | ICD-10-CM

## 2011-02-27 DIAGNOSIS — R142 Eructation: Secondary | ICD-10-CM

## 2011-02-27 DIAGNOSIS — K625 Hemorrhage of anus and rectum: Secondary | ICD-10-CM

## 2011-02-27 DIAGNOSIS — K219 Gastro-esophageal reflux disease without esophagitis: Secondary | ICD-10-CM

## 2011-02-27 DIAGNOSIS — R112 Nausea with vomiting, unspecified: Secondary | ICD-10-CM

## 2011-02-27 MED ORDER — PEG-KCL-NACL-NASULF-NA ASC-C 100 G PO SOLR: 1.0000 | Freq: Once | ORAL | Status: DC

## 2011-02-27 NOTE — Patient Instructions (Signed)
You have been scheduled for a Abdominal Ultrasound on 03/02/11 at 8:30am at [facility]. Nothing to eat or drink 4 hours prior to test. You have also been scheduled for a Upper Endoscopy/ Colonoscopy with propofol. Separate instructions given. Pick up your prep kit from your pharmacy.

## 2011-02-27 NOTE — Progress Notes (Signed)
HISTORY OF PRESENT ILLNESS:  Martin Singh is a 45 y.o. 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 He has been seen in this office for GERD complicated by erosive esophagitis, chronic lower abdominal complaints consistent with irritable bowel syndrome, and evaluation of abnormal LFTs. He presents today with a myriad of abdominal complaints. He is accompanied by his fianc. His chief complaint is that of bloating abdominal discomfort. Problem has been present intermittently for years, but worse in recent months. He also complains of reflux symptoms despite ranitidine 300 mg twice a day. Cannot afford PPI. He reports some nausea with vomiting. Alternating bowel habits. Some weight gain. He also reports intermittent rectal bleeding, mostly on the tissue but occasionally on the stool and in the toilet bowl. For these exact symptoms, he was evaluated in the emergency room February 23 and 10/20/2010. Laboratories including CBC, lipase, alcohol level, and basic chemistries were normal. However, elevated liver tests with SGOT of 363 and SGPT 213. He was again evaluated 12/25/2010 for abdominal pain. Initially felt possibly diverticulitis for which she was placed on Cipro and Flagyl. This did not help. Laboratories at that time revealed normal CBC and normal comprehensive metabolic panel including liver function tests (SGOT 44). Normal urinalysis CT scan of the abdomen and pelvis with contrast was unremarkable. He continues to use alcohol intermittently but states he has had no significant alcohol in several months. He also reports that he has a knot in the right upper quadrant. Some fullness. His fiance thinks that it might be his gallbladder. Negative ultrasound in September of 2011. Last colonoscopy and upper endoscopy in January 2005. Colonoscopy with  hyperplastic and hamartomatous polyps. Upper endoscopy revealing erosive esophagitis  REVIEW OF SYSTEMS:  All non-GI ROS negative except for sinus and allergy trouble, anxiety, arthritis, visual change, depression, fatigue, hearing problems, night sweats,  Past Medical History  Diagnosis Date  . Hypertension   . Depressive disorder, not elsewhere classified   . Osteoarthrosis, unspecified whether generalized or localized, unspecified site   . Unspecified essential hypertension   . Personal history of colonic polyps   . Irritable bowel syndrome   . Esophageal reflux   . Colon polyps     hyperplastic/  . Allergic rhinitis   . Pancreatitis   . History of pneumonia   . Insomnia   . Anxiety   . Diverticulitis     Past Surgical History  Procedure Date  . Tibula surgery     right  . Fibula fracture surgery     right    Social History WEYLIN PLAGGE  reports that he has quit smoking. His smoking use included Cigarettes. He uses smokeless tobacco. He reports that he does not drink alcohol or use illicit drugs.  family history includes Gallbladder disease in his mother; Heart disease in his mother; Hypertension in his father; and Nephrolithiasis in his mother.  There is no history of Colon cancer.  Allergies  Allergen Reactions  . Ativan   . Codeine   . Vicodin (Hydrocodone-Acetaminophen)        PHYSICAL EXAMINATION: Vital signs: BP 112/78  Pulse 84  Ht 5\' 2"  (1.575 m)  Wt 154 lb (69.854 kg)  BMI 28.17 kg/m2  Constitutional: generally well-appearing, no acute distress Psychiatric: alert and oriented x3, cooperative Eyes: extraocular movements intact, anicteric, conjunctiva pink Mouth: oral pharynx moist, no lesions Neck: supple no  lymphadenopathy Cardiovascular: heart regular rate and rhythm, no murmur Lungs: clear to auscultation bilaterally Abdomen: soft, nontender, nondistended, no obvious ascites, no peritoneal signs, normal bowel sounds, no organomegaly Rectal:  Deferred until colonoscopy Extremities: no lower extremity edema bilaterally Skin: no lesions on visible extremities Neuro: No focal deficits. No asterixis.    ASSESSMENT:  #1. Chronic abdominal pain. Most recent workup in the emergency room negative including imaging and laboratories. #2. History of alcohol-induced pancreatitis #3. Chronic intermittent elevation of liver tests secondary to fatty liver and/or alcohol #4. GERD with erosive esophagitis. Active symptoms on ranitidine #5. Irritable bowel. Ongoing #6. Minor intermittent rectal bleeding. #7. History of non-adenomatous polyps in 2005   PLAN:  #1. Abdominal ultrasound. Reevaluate gallbladder and abdominal bloating complaint. #2. Colonoscopy and upper endoscopy to evaluate worsening problems with pain, as well as refractory GERD and intermittent rectal bleeding.The nature of the procedure, as well as the risks, benefits, and alternatives were carefully and thoroughly reviewed with the patient. Ample time for discussion and questions allowed. The patient understood, was satisfied, and agreed to proceed. Movi prep prescribed. The patient instructed on its use. Given patient's issues with anxiety and high doses of sedation required previously, procedural sedation best with CRNA supervision and propofol #3. Discontinue all alcohol use indefinitely

## 2011-03-02 ENCOUNTER — Ambulatory Visit (HOSPITAL_COMMUNITY)
Admission: RE | Admit: 2011-03-02 | Discharge: 2011-03-02 | Disposition: A | Discharge status: Home / Self Care | Payer: Self-pay | Source: Ambulatory Visit | Attending: Internal Medicine | Admitting: Internal Medicine

## 2011-03-02 DIAGNOSIS — R112 Nausea with vomiting, unspecified: Secondary | ICD-10-CM

## 2011-03-02 DIAGNOSIS — K589 Irritable bowel syndrome without diarrhea: Secondary | ICD-10-CM

## 2011-03-02 DIAGNOSIS — R109 Unspecified abdominal pain: Secondary | ICD-10-CM | POA: Insufficient documentation

## 2011-03-02 DIAGNOSIS — R143 Flatulence: Secondary | ICD-10-CM

## 2011-03-02 DIAGNOSIS — F411 Generalized anxiety disorder: Secondary | ICD-10-CM

## 2011-03-02 DIAGNOSIS — K625 Hemorrhage of anus and rectum: Secondary | ICD-10-CM

## 2011-03-02 DIAGNOSIS — K219 Gastro-esophageal reflux disease without esophagitis: Secondary | ICD-10-CM

## 2011-03-13 ENCOUNTER — Encounter: Payer: Self-pay | Admitting: Internal Medicine

## 2011-03-13 ENCOUNTER — Ambulatory Visit (AMBULATORY_SURGERY_CENTER): Payer: Self-pay | Admitting: Internal Medicine

## 2011-03-13 DIAGNOSIS — K625 Hemorrhage of anus and rectum: Secondary | ICD-10-CM

## 2011-03-13 DIAGNOSIS — R112 Nausea with vomiting, unspecified: Secondary | ICD-10-CM

## 2011-03-13 DIAGNOSIS — R141 Gas pain: Secondary | ICD-10-CM

## 2011-03-13 DIAGNOSIS — K5730 Diverticulosis of large intestine without perforation or abscess without bleeding: Secondary | ICD-10-CM

## 2011-03-13 DIAGNOSIS — K2900 Acute gastritis without bleeding: Secondary | ICD-10-CM

## 2011-03-13 DIAGNOSIS — K449 Diaphragmatic hernia without obstruction or gangrene: Secondary | ICD-10-CM

## 2011-03-13 DIAGNOSIS — R1011 Right upper quadrant pain: Secondary | ICD-10-CM

## 2011-03-13 DIAGNOSIS — R109 Unspecified abdominal pain: Secondary | ICD-10-CM

## 2011-03-13 DIAGNOSIS — K2960 Other gastritis without bleeding: Secondary | ICD-10-CM

## 2011-03-13 DIAGNOSIS — K589 Irritable bowel syndrome without diarrhea: Secondary | ICD-10-CM

## 2011-03-13 DIAGNOSIS — K210 Gastro-esophageal reflux disease with esophagitis: Secondary | ICD-10-CM

## 2011-03-13 DIAGNOSIS — K648 Other hemorrhoids: Secondary | ICD-10-CM

## 2011-03-13 HISTORY — DX: Diaphragmatic hernia without obstruction or gangrene: K44.9

## 2011-03-13 HISTORY — DX: Other hemorrhoids: K64.8

## 2011-03-13 MED ORDER — SODIUM CHLORIDE 0.9 % IV SOLN: 500.0000 mL | INTRAVENOUS | Status: DC

## 2011-03-13 NOTE — Patient Instructions (Signed)
Discharge instructions given with verbal understanding. Handouts on diverticulosis, hemorrhoids, gastritis, and esophagitis given.  Resume previous medications.

## 2011-03-14 ENCOUNTER — Telehealth: Payer: Self-pay | Admitting: *Deleted

## 2011-03-14 NOTE — Telephone Encounter (Signed)

## 2011-06-11 LAB — BASIC METABOLIC PANEL
BUN: 16
CO2: 27
Calcium: 9.9
GFR calc non Af Amer: >60
Glucose, Bld: 111 — ABNORMAL HIGH
Sodium: 140

## 2011-07-01 ENCOUNTER — Encounter (HOSPITAL_COMMUNITY): Payer: Self-pay | Admitting: *Deleted

## 2011-07-01 ENCOUNTER — Emergency Department (HOSPITAL_COMMUNITY)
Admission: EM | Admit: 2011-07-01 | Discharge: 2011-07-01 | Disposition: A | Discharge status: Home / Self Care | Payer: Self-pay | Attending: Emergency Medicine | Admitting: Emergency Medicine

## 2011-07-01 ENCOUNTER — Emergency Department (HOSPITAL_COMMUNITY): Payer: Self-pay

## 2011-07-01 DIAGNOSIS — G8929 Other chronic pain: Secondary | ICD-10-CM | POA: Insufficient documentation

## 2011-07-01 DIAGNOSIS — I10 Essential (primary) hypertension: Secondary | ICD-10-CM | POA: Insufficient documentation

## 2011-07-01 DIAGNOSIS — Z9889 Other specified postprocedural states: Secondary | ICD-10-CM | POA: Insufficient documentation

## 2011-07-01 DIAGNOSIS — S300XXA Contusion of lower back and pelvis, initial encounter: Secondary | ICD-10-CM | POA: Insufficient documentation

## 2011-07-01 DIAGNOSIS — K6289 Other specified diseases of anus and rectum: Secondary | ICD-10-CM | POA: Insufficient documentation

## 2011-07-01 DIAGNOSIS — R52 Pain, unspecified: Secondary | ICD-10-CM | POA: Insufficient documentation

## 2011-07-01 DIAGNOSIS — K219 Gastro-esophageal reflux disease without esophagitis: Secondary | ICD-10-CM | POA: Insufficient documentation

## 2011-07-01 DIAGNOSIS — M549 Dorsalgia, unspecified: Secondary | ICD-10-CM | POA: Insufficient documentation

## 2011-07-01 DIAGNOSIS — W19XXXA Unspecified fall, initial encounter: Secondary | ICD-10-CM | POA: Insufficient documentation

## 2011-07-01 DIAGNOSIS — M533 Sacrococcygeal disorders, not elsewhere classified: Secondary | ICD-10-CM | POA: Insufficient documentation

## 2011-07-01 DIAGNOSIS — F341 Dysthymic disorder: Secondary | ICD-10-CM | POA: Insufficient documentation

## 2011-07-01 DIAGNOSIS — M1990 Unspecified osteoarthritis, unspecified site: Secondary | ICD-10-CM | POA: Insufficient documentation

## 2011-07-01 LAB — URINALYSIS, ROUTINE W REFLEX MICROSCOPIC
Bilirubin Urine: NEGATIVE
Glucose, UA: NEGATIVE mg/dL
Hgb urine dipstick: NEGATIVE
Ketones, ur: NEGATIVE mg/dL
Protein, ur: NEGATIVE mg/dL
pH: 5.5 (ref 5.0–8.0)

## 2011-07-01 MED ORDER — HYDROMORPHONE HCL PF 2 MG/ML IJ SOLN
2.0000 mg | Freq: Once | INTRAMUSCULAR | Status: AC
  Administered 2011-07-01: 2 mg via INTRAMUSCULAR
  Filled 2011-07-01: qty 1

## 2011-07-01 MED ORDER — OXYCODONE-ACETAMINOPHEN 5-325 MG PO TABS: 2.0000 | ORAL_TABLET | ORAL | Status: DC | PRN

## 2011-07-01 NOTE — ED Notes (Signed)
Patient hurt his coccyx about 5 months ago and pain has been getting worse and affecting his life.

## 2011-07-02 NOTE — ED Provider Notes (Signed)
History     CSN: [id number] Arrival date & time: 07/01/2011  8:55 PM   First MD Initiated Contact with Patient 07/01/11 2100      Chief Complaint  Patient presents with  . Tailbone Pain    (Consider location/radiation/quality/duration/timing/severity/associated sxs/prior treatment) HPI Comments: Patient presents with acute on chronic coccygeal pain. States he fell and hurt his coccyx about 5 months ago but did not seek evaluation. States he's had worsening pain since. He is having difficulty with ambulation and this is affecting his everyday life. He has been taking over-the-counter pain medication without resolution of his pain. Has been able to have normal bowel movements although with some pain on passing stool. Has no urinary symptoms. He has no weakness, paresthesias, urinary retention, loss of bowel or bladder function. There is no additional injury. Has also been sitting on a special pillow without significant relief. There has been no blood in his stool. He denies chest pain, shortness of breath, abdominal pain  The history is provided by the patient. No language interpreter was used.    Past Medical History  Diagnosis Date  . Hypertension   . Depressive disorder, not elsewhere classified   . Osteoarthrosis, unspecified whether generalized or localized, unspecified site   . Unspecified essential hypertension   . Personal history of colonic polyps   . Irritable bowel syndrome   . Esophageal reflux   . Colon polyps     hyperplastic/  . Allergic rhinitis   . Pancreatitis   . History of pneumonia   . Insomnia   . Anxiety   . Diverticulitis     Past Surgical History  Procedure Date  . Tibula surgery     right  . Fibula fracture surgery     right    Family History  Problem Relation Age of Onset  . Heart disease Mother   . Gallbladder disease Mother   . Nephrolithiasis Mother   . Colon cancer Neg Hx   . Hypertension Father     moth    History  Substance Use  Topics  . Smoking status: Former Smoker    Types: Cigarettes  . Smokeless tobacco: Current User    Types: Snuff  . Alcohol Use: No      Review of Systems  Constitutional: Negative for fever, activity change, appetite change and fatigue.  HENT: Negative for congestion, sore throat, rhinorrhea, neck pain and neck stiffness.   Respiratory: Negative for cough, chest tightness and shortness of breath.   Cardiovascular: Negative for chest pain and palpitations.  Gastrointestinal: Positive for rectal pain. Negative for nausea, vomiting, abdominal pain, diarrhea, constipation and blood in stool.  Genitourinary: Negative for dysuria, urgency, frequency and flank pain.  Musculoskeletal: Positive for back pain. Negative for myalgias, arthralgias and gait problem.  Skin: Negative for rash and wound.  Neurological: Negative for dizziness, weakness, light-headedness, numbness and headaches.  All other systems reviewed and are negative.    Allergies  Ativan; Codeine; and Vicodin  Home Medications   Current Outpatient Rx  Name Route Sig Dispense Refill  . BENAZEPRIL HCL 20 MG PO TABS Oral Take 20 mg by mouth daily.     Marland Kitchen CITALOPRAM HYDROBROMIDE 20 MG PO TABS Oral Take 20 mg by mouth at bedtime as needed.     Marland Kitchen DIAZEPAM 5 MG PO TABS Oral Take 5 mg by mouth 2 (two) times daily as needed.     Marland Kitchen HYDROCHLOROTHIAZIDE 12.5 MG PO CAPS Oral Take 12.5 mg by mouth daily.     Marland Kitchen  IBUPROFEN 200 MG PO TABS Oral Take 200 mg by mouth every 6 (six) hours as needed.     . OXYCODONE-ACETAMINOPHEN 5-325 MG PO TABS Oral Take 2 tablets by mouth every 4 (four) hours as needed for pain. 15 tablet 0  . PROMETHAZINE HCL 25 MG PO TABS Oral Take 25 mg by mouth every 8 (eight) hours as needed.       BP 127/69  Pulse 100  Temp(Src) 98.7 F (37.1 C) (Oral)  Resp 18  SpO2 97%  Physical Exam  Nursing note and vitals reviewed. Constitutional: He is oriented to person, place, and time. He appears well-developed and  well-nourished. He appears distressed (pain).  HENT:  Head: Normocephalic and atraumatic.  Mouth/Throat: Oropharynx is clear and moist. No oropharyngeal exudate.  Eyes: Conjunctivae and EOM are normal. Pupils are equal, round, and reactive to light.  Neck: Normal range of motion. Neck supple.  Cardiovascular: Normal rate, regular rhythm, normal heart sounds and intact distal pulses.  Exam reveals no gallop and no friction rub.   No murmur heard. Pulmonary/Chest: Effort normal and breath sounds normal. No respiratory distress. He exhibits no tenderness.  Abdominal: Soft. Bowel sounds are normal. There is no tenderness.  Genitourinary: Rectal exam shows no mass and no tenderness.       There is no palpable open coccygeal fracture on rectal examination  Musculoskeletal: He exhibits tenderness (on palpation of the sacral and coccygeal areas both midline and laterally.).  Neurological: He is alert and oriented to person, place, and time.  Skin: Skin is warm and dry. No rash noted.    ED Course  Procedures (including critical care time)   Labs Reviewed  URINALYSIS, ROUTINE W REFLEX MICROSCOPIC   Dg Sacrum/coccyx  07/01/2011  *RADIOLOGY REPORT*  Clinical Data: Fall  SACRUM AND COCCYX - 2+ VIEW  Comparison: None.  Findings: No evidence of acute fracture of the sacrum.  Anatomic alignment.  The articular surface of the right femoral head is irregular. Focal sclerosis in the medial femoral head is present.  These findings can be associated with avascular necrosis.  IMPRESSION: No evidence of acute injury to the sacrum.  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  MRI can be performed to further delineate.  Original Report Authenticated By: Donavan Burnet, M.D.     1. Coccygeal contusion       MDM  Imaging of the sacrum and coccyx negative for fracture. Rectal examination was negative. Urinalysis was negative. I will treat his pain  aggressively. He'll be discharged home with Percocet. He is instructed to continue with the doughnut pillow as well as apply ice and heat as needed. He is instructed to followup with his primary care physician. There is no concern for cauda equina as he lacks any of these symptoms. I'm not concerned about an additional neurologic cause of his pain        Dayton Bailiff, MD 07/02/11 [phone]

## 2011-07-30 ENCOUNTER — Encounter: Payer: Self-pay | Admitting: Internal Medicine

## 2011-07-30 ENCOUNTER — Ambulatory Visit (INDEPENDENT_AMBULATORY_CARE_PROVIDER_SITE_OTHER)
Admission: RE | Admit: 2011-07-30 | Discharge: 2011-07-30 | Disposition: A | Discharge status: Home / Self Care | Payer: Self-pay | Source: Ambulatory Visit | Attending: Internal Medicine | Admitting: Internal Medicine

## 2011-07-30 ENCOUNTER — Ambulatory Visit (INDEPENDENT_AMBULATORY_CARE_PROVIDER_SITE_OTHER): Payer: Self-pay | Admitting: Internal Medicine

## 2011-07-30 VITALS — BP 138/88 | HR 84 | Temp 98.7°F | Resp 16 | Wt 169.0 lb

## 2011-07-30 DIAGNOSIS — M545 Low back pain, unspecified: Secondary | ICD-10-CM

## 2011-07-30 DIAGNOSIS — G8929 Other chronic pain: Secondary | ICD-10-CM | POA: Insufficient documentation

## 2011-07-30 DIAGNOSIS — I10 Essential (primary) hypertension: Secondary | ICD-10-CM

## 2011-07-30 MED ORDER — BUPRENORPHINE 5 MCG/HR TD PTWK: 1.0000 | MEDICATED_PATCH | TRANSDERMAL | Status: DC
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
## 2011-07-30 NOTE — Assessment & Plan Note (Signed)
It is difficult to get an accurate exam of his legs because he has had prior ortho injuries and surgeries but I am concerned about the radicular nature of his pain so I have asked him to get an MRI done to look for nerve impingement, herniated disc, spinal stenosis. Today I will repeat the xray of his L-spine to see if there is a fracture. He will try butrans patch for relief of the pain.

## 2011-07-30 NOTE — Assessment & Plan Note (Signed)
His BP is well controlled 

## 2011-07-30 NOTE — Patient Instructions (Signed)
Back Pain, Adult Low back pain is very common. About 1 in 5 people have back pain.The cause of low back pain is rarely dangerous. The pain often gets better over time.About half of people with a sudden onset of back pain feel better in just 2 weeks. About 8 in 10 people feel better by 6 weeks.  CAUSES Some common causes of back pain include:  Strain of the muscles or ligaments supporting the spine.   Wear and tear (degeneration) of the spinal discs.   Arthritis.   Direct injury to the back.  DIAGNOSIS Most of the time, the direct cause of low back pain is not known.However, back pain can be treated effectively even when the exact cause of the pain is unknown.Answering your caregiver's questions about your overall health and symptoms is one of the most accurate ways to make sure the cause of your pain is not dangerous. If your caregiver needs more information, he or she may order lab work or imaging tests (X-rays or MRIs).However, even if imaging tests show changes in your back, this usually does not require surgery. HOME CARE INSTRUCTIONS For many people, back pain returns.Since low back pain is rarely dangerous, it is often a condition that people can learn to manageon their own.   Remain active. It is stressful on the back to sit or stand in one place. Do not sit, drive, or stand in one place for more than 30 minutes at a time. Take short walks on level surfaces as soon as pain allows.Try to increase the length of time you walk each day.   Do not stay in bed.Resting more than 1 or 2 days can delay your recovery.   Do not avoid exercise or work.Your body is made to move.It is not dangerous to be active, even though your back may hurt.Your back will likely heal faster if you return to being active before your pain is gone.   Pay attention to your body when you bend and lift. Many people have less discomfortwhen lifting if they bend their knees, keep the load close to their  bodies,and avoid twisting. Often, the most comfortable positions are those that put less stress on your recovering back.   Find a comfortable position to sleep. Use a firm mattress and lie on your side with your knees slightly bent. If you lie on your back, put a pillow under your knees.   Only take over-the-counter or prescription medicines as directed by your caregiver. Over-the-counter medicines to reduce pain and inflammation are often the most helpful.Your caregiver may prescribe muscle relaxant drugs.These medicines help dull your pain so you can more quickly return to your normal activities and healthy exercise.   Put ice on the injured area.   Put ice in a plastic bag.   Place a towel between your skin and the bag.   Leave the ice on for 15 to 20 minutes, 3 to 4 times a day for the first 2 to 3 days. After that, ice and heat may be alternated to reduce pain and spasms.   Ask your caregiver about trying back exercises and gentle massage. This may be of some benefit.   Avoid feeling anxious or stressed.Stress increases muscle tension and can worsen back pain.It is important to recognize when you are anxious or stressed and learn ways to manage it.Exercise is a great option.  SEEK MEDICAL CARE IF:  You have pain that is not relieved with rest or medicine.   You have   pain that does not improve in 1 week.   You have new symptoms.   You are generally not feeling well.  SEEK IMMEDIATE MEDICAL CARE IF:   You have pain that radiates from your back into your legs.   You develop new bowel or bladder control problems.   You have unusual weakness or numbness in your arms or legs.   You develop nausea or vomiting.   You develop abdominal pain.   You feel faint.  Document Released: 08/13/2005 Document Revised: 04/25/2011 Document Reviewed: 01/01/2011 [employer] Patient Information 2012 [employer]. 

## 2011-07-31 ENCOUNTER — Telehealth: Payer: Self-pay | Admitting: *Deleted

## 2011-07-31 DIAGNOSIS — M545 Low back pain: Secondary | ICD-10-CM

## 2011-07-31 MED ORDER — TRAMADOL HCL 50 MG PO TABS: 50.0000 mg | ORAL_TABLET | Freq: Four times a day (QID) | ORAL | Status: DC | PRN

## 2011-07-31 NOTE — Telephone Encounter (Signed)
Pt states that the patch for Buprenorphine that was ordered 12.03.12 cannot be found at pharmacy--also states that it is not costwise, even w/the discount coupon given. Request alternative Rx and/or injection in back.

## 2011-07-31 NOTE — Telephone Encounter (Signed)
New Rx was sent to his pharmacy

## 2011-07-31 NOTE — Telephone Encounter (Signed)
Pt said thanks for the new Rx and would like the results of his Xray please.

## 2011-08-01 NOTE — Telephone Encounter (Signed)
Informed patient

## 2011-08-01 NOTE — Telephone Encounter (Signed)
Plain xray was normal

## 2011-08-03 ENCOUNTER — Ambulatory Visit (HOSPITAL_COMMUNITY)
Admission: RE | Admit: 2011-08-03 | Discharge: 2011-08-03 | Disposition: A | Discharge status: Home / Self Care | Payer: Self-pay | Source: Ambulatory Visit | Attending: Internal Medicine | Admitting: Internal Medicine

## 2011-08-03 DIAGNOSIS — M545 Low back pain: Secondary | ICD-10-CM

## 2011-08-06 ENCOUNTER — Ambulatory Visit (HOSPITAL_COMMUNITY)
Admission: RE | Admit: 2011-08-06 | Discharge: 2011-08-06 | Disposition: A | Discharge status: Home / Self Care | Payer: Self-pay | Source: Ambulatory Visit | Attending: Internal Medicine | Admitting: Internal Medicine

## 2011-08-06 ENCOUNTER — Other Ambulatory Visit: Payer: Self-pay | Admitting: Internal Medicine

## 2011-08-06 DIAGNOSIS — M549 Dorsalgia, unspecified: Secondary | ICD-10-CM

## 2011-08-06 DIAGNOSIS — M545 Low back pain, unspecified: Secondary | ICD-10-CM | POA: Insufficient documentation

## 2011-08-06 DIAGNOSIS — M533 Sacrococcygeal disorders, not elsewhere classified: Secondary | ICD-10-CM | POA: Insufficient documentation

## 2011-08-06 DIAGNOSIS — Z1389 Encounter for screening for other disorder: Secondary | ICD-10-CM | POA: Insufficient documentation

## 2011-08-08 ENCOUNTER — Encounter: Payer: Self-pay | Admitting: Internal Medicine

## 2011-08-08 ENCOUNTER — Ambulatory Visit (INDEPENDENT_AMBULATORY_CARE_PROVIDER_SITE_OTHER): Payer: Self-pay | Admitting: Internal Medicine

## 2011-08-08 VITALS — BP 118/86 | HR 86 | Temp 99.4°F | Resp 16

## 2011-08-08 DIAGNOSIS — I10 Essential (primary) hypertension: Secondary | ICD-10-CM

## 2011-08-08 DIAGNOSIS — J209 Acute bronchitis, unspecified: Secondary | ICD-10-CM | POA: Insufficient documentation

## 2011-08-08 DIAGNOSIS — M545 Low back pain: Secondary | ICD-10-CM

## 2011-08-08 DIAGNOSIS — R05 Cough: Secondary | ICD-10-CM

## 2011-08-08 MED ORDER — MOXIFLOXACIN HCL 400 MG PO TABS: 400.0000 mg | ORAL_TABLET | Freq: Every day | ORAL | Status: AC

## 2011-08-08 MED ORDER — PROMETHAZINE-DM 6.25-15 MG/5ML PO SYRP: 5.0000 mL | ORAL_SOLUTION | Freq: Four times a day (QID) | ORAL | Status: AC | PRN

## 2011-08-08 MED ORDER — OLMESARTAN MEDOXOMIL 40 MG PO TABS: 40.0000 mg | ORAL_TABLET | Freq: Every day | ORAL | Status: DC

## 2011-08-08 NOTE — Patient Instructions (Signed)
Acute Bronchitis You have acute bronchitis. This means you have a chest cold. The airways in your lungs are red and sore (inflamed). Acute means it is sudden onset.  CAUSES Bronchitis is most often caused by the same virus that causes a cold. SYMPTOMS   Body aches.   Chest congestion.   Chills.   Cough.   Fever.   Shortness of breath.   Sore throat.  TREATMENT  Acute bronchitis is usually treated with rest, fluids, and medicines for relief of fever or cough. Most symptoms should go away after a few days or a week. Increased fluids may help thin your secretions and will prevent dehydration. Your caregiver may give you an inhaler to improve your symptoms. The inhaler reduces shortness of breath and helps control cough. You can take over-the-counter pain relievers or cough medicine to decrease coughing, pain, or fever. A cool-air vaporizer may help thin bronchial secretions and make it easier to clear your chest. Antibiotics are usually not needed but can be prescribed if you smoke, are seriously ill, have chronic lung problems, are elderly, or you are at higher risk for developing complications.Allergies and asthma can make bronchitis worse. Repeated episodes of bronchitis may cause longstanding lung problems. Avoid smoking and secondhand smoke.Exposure to cigarette smoke or irritating chemicals will make bronchitis worse. If you are a cigarette smoker, consider using nicotine gum or skin patches to help control withdrawal symptoms. Quitting smoking will help your lungs heal faster. Recovery from bronchitis is often slow, but you should start feeling better after 2 to 3 days. Cough from bronchitis frequently lasts for 3 to 4 weeks. To prevent another bout of acute bronchitis:  Quit smoking.   Wash your hands frequently to get rid of viruses or use a hand sanitizer.   Avoid other people with cold or virus symptoms.   Try not to touch your hands to your mouth, nose, or eyes.  SEEK  IMMEDIATE MEDICAL CARE IF:  You develop increased fever, chills, or chest pain.   You have severe shortness of breath or bloody sputum.   You develop dehydration, fainting, repeated vomiting, or a severe headache.   You have no improvement after 1 week of treatment or you get worse.  MAKE SURE YOU:   Understand these instructions.   Will watch your condition.   Will get help right away if you are not doing well or get worse.  Document Released: 09/20/2004 Document Revised: 04/25/2011 Document Reviewed: 12/06/2010 [employer] Patient Information 2012 [employer].Hypertension As your heart beats, it forces blood through your arteries. This force is your blood pressure. If the pressure is too high, it is called hypertension (HTN) or high blood pressure. HTN is dangerous because you may have it and not know it. High blood pressure may mean that your heart has to work harder to pump blood. Your arteries may be narrow or stiff. The extra work puts you at risk for heart disease, stroke, and other problems.  Blood pressure consists of two numbers, a higher number over a lower, 110/72, for example. It is stated as "110 over 72." The ideal is below 120 for the top number (systolic) and under 80 for the bottom (diastolic). Write down your blood pressure today. You should pay close attention to your blood pressure if you have certain conditions such as:  Heart failure.   Prior heart attack.   Diabetes   Chronic kidney disease.   Prior stroke.   Multiple risk factors for heart disease.  To   see if you have HTN, your blood pressure should be measured while you are seated with your arm held at the level of the heart. It should be measured at least twice. A one-time elevated blood pressure reading (especially in the Emergency Department) does not mean that you need treatment. There may be conditions in which the blood pressure is different between your right and left arms. It is important to see  your caregiver soon for a recheck. Most people have essential hypertension which means that there is not a specific cause. This type of high blood pressure may be lowered by changing lifestyle factors such as:  Stress.   Smoking.   Lack of exercise.   Excessive weight.   Drug/tobacco/alcohol use.   Eating less salt.  Most people do not have symptoms from high blood pressure until it has caused damage to the body. Effective treatment can often prevent, delay or reduce that damage. TREATMENT  When a cause has been identified, treatment for high blood pressure is directed at the cause. There are a large number of medications to treat HTN. These fall into several categories, and your caregiver will help you select the medicines that are best for you. Medications may have side effects. You should review side effects with your caregiver. If your blood pressure stays high after you have made lifestyle changes or started on medicines,   Your medication(s) may need to be changed.   Other problems may need to be addressed.   Be certain you understand your prescriptions, and know how and when to take your medicine.   Be sure to follow up with your caregiver within the time frame advised (usually within two weeks) to have your blood pressure rechecked and to review your medications.   If you are taking more than one medicine to lower your blood pressure, make sure you know how and at what times they should be taken. Taking two medicines at the same time can result in blood pressure that is too low.  SEEK IMMEDIATE MEDICAL CARE IF:  You develop a severe headache, blurred or changing vision, or confusion.   You have unusual weakness or numbness, or a faint feeling.   You have severe chest or abdominal pain, vomiting, or breathing problems.  MAKE SURE YOU:   Understand these instructions.   Will watch your condition.   Will get help right away if you are not doing well or get worse.    Document Released: 08/13/2005 Document Revised: 04/25/2011 Document Reviewed: 04/02/2008 [employer] Patient Information 2012 [employer]. 

## 2011-08-08 NOTE — Progress Notes (Signed)
Subjective:    Patient ID: Martin Singh, male    DOB: 05-07-1966, 45 y.o.   MRN: [id number]  Cough This is a recurrent problem. The current episode started more than 1 month ago. The problem has been gradually worsening. The problem occurs every few minutes. The cough is productive of purulent sputum. Associated symptoms include chills. Pertinent negatives include no chest pain, ear congestion, ear pain, fever, headaches, heartburn, hemoptysis, myalgias, nasal congestion, postnasal drip, rash, rhinorrhea, sore throat, shortness of breath, sweats, weight loss or wheezing. The symptoms are aggravated by nothing. He has tried prescription cough suppressant for the symptoms. The treatment provided mild relief. His past medical history is significant for bronchitis.  Hypertension This is a chronic problem. The current episode started more than 1 year ago. The problem has been gradually improving since onset. The problem is controlled. Pertinent negatives include no anxiety, blurred vision, chest pain, headaches, malaise/fatigue, neck pain, orthopnea, palpitations, peripheral edema, PND, shortness of breath or sweats. Past treatments include ACE inhibitors and diuretics. The current treatment provides significant improvement. Compliance problems include medication side effects, exercise and diet (chronic cough).       Review of Systems  Constitutional: Positive for chills. Negative for fever, weight loss, malaise/fatigue, diaphoresis, activity change, appetite change, fatigue and unexpected weight change.  HENT: Negative for ear pain, nosebleeds, congestion, sore throat, rhinorrhea, sneezing, trouble swallowing, neck pain, voice change, postnasal drip, sinus pressure and ear discharge.   Eyes: Negative.  Negative for blurred vision.  Respiratory: Positive for cough. Negative for hemoptysis, chest tightness, shortness of breath, wheezing and stridor.   Cardiovascular: Negative for chest pain,  palpitations, orthopnea, leg swelling and PND.  Gastrointestinal: Negative for heartburn, nausea, vomiting, abdominal pain, diarrhea and constipation.  Genitourinary: Negative.   Musculoskeletal: Positive for back pain (chronic, unchanged). Negative for myalgias, joint swelling, arthralgias and gait problem.  Skin: Negative for color change, pallor, rash and wound.  Neurological: Negative.  Negative for dizziness, tremors, seizures, syncope, facial asymmetry, speech difficulty, weakness, light-headedness, numbness and headaches.  Hematological: Negative for adenopathy. Does not bruise/bleed easily.  Psychiatric/Behavioral: Negative.        Objective:   Physical Exam  Vitals reviewed. Constitutional: He is oriented to person, place, and time. He appears well-developed and well-nourished. No distress.  HENT:  Right Ear: Hearing, tympanic membrane, external ear and ear canal normal.  Left Ear: Hearing, tympanic membrane, external ear and ear canal normal.  Nose: Nose normal. No mucosal edema, rhinorrhea, nose lacerations, sinus tenderness, nasal deformity, septal deviation or nasal septal hematoma. No epistaxis.  No foreign bodies.  Mouth/Throat: Oropharynx is clear and moist and mucous membranes are normal. Mucous membranes are not pale, not dry and not cyanotic. No oropharyngeal exudate, posterior oropharyngeal edema, posterior oropharyngeal erythema or tonsillar abscesses.  Eyes: Conjunctivae are normal. Right eye exhibits no discharge. Left eye exhibits no discharge. No scleral icterus.  Neck: Normal range of motion. Neck supple. No JVD present. No tracheal deviation present. No thyromegaly present.  Cardiovascular: Normal rate, regular rhythm, normal heart sounds and intact distal pulses.  Exam reveals no gallop and no friction rub.   No murmur heard. Pulmonary/Chest: Effort normal and breath sounds normal. No stridor. No respiratory distress. He has no wheezes. He has no rales. He exhibits  no tenderness.  Abdominal: Soft. Bowel sounds are normal. He exhibits no distension and no mass. There is no tenderness. There is no rebound and no guarding.  Musculoskeletal: Normal range of motion. He exhibits  no edema and no tenderness.       Lumbar back: Normal. He exhibits normal range of motion, no tenderness, no bony tenderness, no swelling, no edema, no deformity, no laceration, no pain and no spasm.  Lymphadenopathy:    He has no cervical adenopathy.  Neurological: He is oriented to person, place, and time.  Skin: Skin is warm and dry. No rash noted. He is not diaphoretic. No erythema. No pallor.  Psychiatric: He has a normal mood and affect. His behavior is normal. Judgment and thought content normal.          Assessment & Plan:

## 2011-08-08 NOTE — Assessment & Plan Note (Signed)
He will have to stop the ACEI due to the chronic cough, I gave him samples of benicar instead

## 2011-08-08 NOTE — Assessment & Plan Note (Signed)
MRI shows a bulging disc, he does not want any further treatment at this time

## 2011-08-08 NOTE — Assessment & Plan Note (Signed)
Start avelox and a cough suppressant 

## 2011-08-08 NOTE — Assessment & Plan Note (Signed)
I will check a CXR today to see if he has PNA, mass, edema, etc.

## 2011-08-16 ENCOUNTER — Ambulatory Visit (INDEPENDENT_AMBULATORY_CARE_PROVIDER_SITE_OTHER): Payer: Self-pay | Admitting: Internal Medicine

## 2011-08-16 ENCOUNTER — Ambulatory Visit (INDEPENDENT_AMBULATORY_CARE_PROVIDER_SITE_OTHER)
Admission: RE | Admit: 2011-08-16 | Discharge: 2011-08-16 | Disposition: A | Discharge status: Home / Self Care | Payer: Self-pay | Source: Ambulatory Visit | Attending: Internal Medicine | Admitting: Internal Medicine

## 2011-08-16 ENCOUNTER — Encounter: Payer: Self-pay | Admitting: Internal Medicine

## 2011-08-16 VITALS — BP 146/82 | HR 90 | Temp 98.0°F | Resp 16 | Wt 165.0 lb

## 2011-08-16 DIAGNOSIS — R05 Cough: Secondary | ICD-10-CM

## 2011-08-16 DIAGNOSIS — Z23 Encounter for immunization: Secondary | ICD-10-CM

## 2011-08-16 DIAGNOSIS — J209 Acute bronchitis, unspecified: Secondary | ICD-10-CM

## 2011-08-16 MED ORDER — ACLIDINIUM BROMIDE 400 MCG/ACT IN AEPB: 1.0000 | INHALATION_SPRAY | Freq: Two times a day (BID) | RESPIRATORY_TRACT | Status: DC

## 2011-08-16 NOTE — Progress Notes (Signed)
  Subjective:    Patient ID: Martin Singh, male    DOB: Jan 09, 1966, 45 y.o.   MRN: [id number]  Cough This is a recurrent problem. The current episode started 1 to 4 weeks ago. The problem has been unchanged. The problem occurs every few hours. The cough is productive of purulent sputum. Associated symptoms include chills, a fever, a sore throat, shortness of breath, sweats and wheezing. Pertinent negatives include no chest pain, ear congestion, ear pain, headaches, heartburn, hemoptysis, myalgias, nasal congestion, postnasal drip, rash, rhinorrhea or weight loss. He has tried OTC cough suppressant (avelox) for the symptoms. The treatment provided mild relief.      Review of Systems  Constitutional: Positive for fever and chills. Negative for weight loss, diaphoresis, activity change, appetite change, fatigue and unexpected weight change.  HENT: Positive for sore throat and voice change. Negative for hearing loss, ear pain, nosebleeds, congestion, facial swelling, rhinorrhea, sneezing, drooling, mouth sores, trouble swallowing, neck pain, neck stiffness, dental problem, postnasal drip, sinus pressure, tinnitus and ear discharge.   Eyes: Negative.   Respiratory: Positive for cough, shortness of breath and wheezing. Negative for apnea, hemoptysis, choking, chest tightness and stridor.   Cardiovascular: Negative for chest pain, palpitations and leg swelling.  Gastrointestinal: Negative for heartburn, nausea, vomiting, abdominal pain, diarrhea and constipation.  Genitourinary: Negative for urgency, frequency, hematuria, flank pain, decreased urine volume, enuresis, difficulty urinating and penile pain.  Musculoskeletal: Positive for back pain (unchanged). Negative for myalgias, joint swelling, arthralgias and gait problem.  Skin: Negative for color change, pallor, rash and wound.  Neurological: Negative.  Negative for headaches.  Hematological: Negative for adenopathy. Does not bruise/bleed easily.    Psychiatric/Behavioral: Negative.        Objective:   Physical Exam  Vitals reviewed. Constitutional: He is oriented to person, place, and time. He appears well-developed and well-nourished. No distress.  HENT:  Head: Normocephalic and atraumatic.  Mouth/Throat: Oropharynx is clear and moist. No oropharyngeal exudate.  Eyes: Conjunctivae are normal. Right eye exhibits no discharge. Left eye exhibits no discharge. No scleral icterus.  Neck: Normal range of motion. Neck supple. No JVD present. No tracheal deviation present. No thyromegaly present.  Cardiovascular: Normal rate, regular rhythm, normal heart sounds and intact distal pulses.  Exam reveals no gallop and no friction rub.   No murmur heard. Pulmonary/Chest: Effort normal and breath sounds normal. No stridor. No respiratory distress. He has no wheezes. He has no rales. He exhibits no tenderness.  Abdominal: Soft. Bowel sounds are normal. He exhibits no distension and no mass. There is no tenderness. There is no rebound and no guarding.  Musculoskeletal: Normal range of motion. He exhibits no edema and no tenderness.  Lymphadenopathy:    He has no cervical adenopathy.  Neurological: He is oriented to person, place, and time.  Skin: Skin is warm and dry. No rash noted. He is not diaphoretic. No erythema. No pallor.  Psychiatric: He has a normal mood and affect. His behavior is normal. Judgment and thought content normal.          Assessment & Plan:

## 2011-08-16 NOTE — Assessment & Plan Note (Addendum)
He has a remote history of tobacco abuse so he will start using tudorza to see if that helps with his symptoms, also he will continue avelox

## 2011-08-16 NOTE — Assessment & Plan Note (Signed)
I will check his CXR to look for mass, pna, edema, etc.

## 2011-08-16 NOTE — Patient Instructions (Signed)

## 2011-08-22 ENCOUNTER — Telehealth: Payer: Self-pay

## 2011-08-22 NOTE — Telephone Encounter (Signed)
Pt c/o continued productive cough, body aches and fatigue. Pt is requesting advisement from MD, should he still be feeling ill? Please advise.

## 2011-08-22 NOTE — Telephone Encounter (Signed)
It can take a long time to recover from these infections, ask him to let me know if he has any new or worsening symptoms

## 2011-08-23 NOTE — Telephone Encounter (Signed)
Patient notified per MD/LMOVM

## 2011-08-26 ENCOUNTER — Encounter (HOSPITAL_COMMUNITY): Payer: Self-pay | Admitting: *Deleted

## 2011-08-26 ENCOUNTER — Emergency Department (HOSPITAL_COMMUNITY): Payer: Self-pay

## 2011-08-26 ENCOUNTER — Emergency Department (HOSPITAL_COMMUNITY)
Admission: EM | Admit: 2011-08-26 | Discharge: 2011-08-27 | Disposition: A | Discharge status: Home / Self Care | Payer: Self-pay | Attending: Emergency Medicine | Admitting: Emergency Medicine

## 2011-08-26 DIAGNOSIS — Z79899 Other long term (current) drug therapy: Secondary | ICD-10-CM | POA: Insufficient documentation

## 2011-08-26 DIAGNOSIS — R05 Cough: Secondary | ICD-10-CM

## 2011-08-26 DIAGNOSIS — R059 Cough, unspecified: Secondary | ICD-10-CM | POA: Insufficient documentation

## 2011-08-26 DIAGNOSIS — R079 Chest pain, unspecified: Secondary | ICD-10-CM | POA: Insufficient documentation

## 2011-08-26 DIAGNOSIS — K589 Irritable bowel syndrome without diarrhea: Secondary | ICD-10-CM | POA: Insufficient documentation

## 2011-08-26 DIAGNOSIS — N289 Disorder of kidney and ureter, unspecified: Secondary | ICD-10-CM | POA: Insufficient documentation

## 2011-08-26 DIAGNOSIS — I10 Essential (primary) hypertension: Secondary | ICD-10-CM | POA: Insufficient documentation

## 2011-08-26 DIAGNOSIS — R0602 Shortness of breath: Secondary | ICD-10-CM | POA: Insufficient documentation

## 2011-08-26 DIAGNOSIS — K219 Gastro-esophageal reflux disease without esophagitis: Secondary | ICD-10-CM | POA: Insufficient documentation

## 2011-08-26 LAB — POCT I-STAT, CHEM 8
BUN: 29 mg/dL — ABNORMAL HIGH (ref 6–23)
Calcium, Ion: 1.14 mmol/L (ref 1.12–1.32)
Chloride: 107 mEq/L (ref 96–112)
Creatinine, Ser: 2.2 mg/dL — ABNORMAL HIGH (ref 0.50–1.35)
Glucose, Bld: 104 mg/dL — ABNORMAL HIGH (ref 70–99)
HCT: 44 % (ref 39.0–52.0)
Hemoglobin: 15 g/dL (ref 13.0–17.0)
Potassium: 4.5 mEq/L (ref 3.5–5.1)
Sodium: 137 meq/L (ref 135–145)
TCO2: 24 mmol/L (ref 0–100)

## 2011-08-26 LAB — CBC
HCT: 38.8 % — ABNORMAL LOW (ref 39.0–52.0)
Hemoglobin: 13.7 g/dL (ref 13.0–17.0)
MCHC: 35.3 g/dL (ref 30.0–36.0)
MCV: 91.5 fL (ref 78.0–100.0)
RDW: 13.4 % (ref 11.5–15.5)

## 2011-08-26 LAB — URINALYSIS, ROUTINE W REFLEX MICROSCOPIC
Bilirubin Urine: NEGATIVE
Glucose, UA: NEGATIVE mg/dL
Hgb urine dipstick: NEGATIVE
Specific Gravity, Urine: 1.021 (ref 1.005–1.030)
Urobilinogen, UA: 0.2 mg/dL (ref 0.0–1.0)

## 2011-08-26 LAB — BASIC METABOLIC PANEL
BUN: 30 mg/dL — ABNORMAL HIGH (ref 6–23)
CO2: 23 mEq/L (ref 19–32)
Glucose, Bld: 99 mg/dL (ref 70–99)
Potassium: 4.4 mEq/L (ref 3.5–5.1)
Sodium: 134 mEq/L — ABNORMAL LOW (ref 135–145)

## 2011-08-26 LAB — BASIC METABOLIC PANEL WITH GFR
Calcium: 9 mg/dL (ref 8.4–10.5)
Chloride: 97 meq/L (ref 96–112)
Creatinine, Ser: 2.13 mg/dL — ABNORMAL HIGH (ref 0.50–1.35)
GFR calc Af Amer: 41 mL/min — ABNORMAL LOW (ref >90)
GFR calc non Af Amer: 35 mL/min — ABNORMAL LOW (ref >90)

## 2011-08-26 MED ORDER — TECHNETIUM TO 99M ALBUMIN AGGREGATED
6.0000 | Freq: Once | INTRAVENOUS | Status: AC | PRN
  Administered 2011-08-26: 6 via INTRAVENOUS

## 2011-08-26 MED ORDER — XENON XE 133 GAS
10.0000 | GAS_FOR_INHALATION | Freq: Once | RESPIRATORY_TRACT | Status: AC | PRN
  Administered 2011-08-26: 10 via RESPIRATORY_TRACT

## 2011-08-26 MED ORDER — OXYCODONE-ACETAMINOPHEN 5-325 MG PO TABS: 1.0000 | ORAL_TABLET | Freq: Four times a day (QID) | ORAL | Status: DC | PRN

## 2011-08-26 MED ORDER — SODIUM CHLORIDE 0.9 % IV BOLUS (SEPSIS)
1000.0000 mL | Freq: Once | INTRAVENOUS | Status: AC
  Administered 2011-08-26: 1000 mL via INTRAVENOUS

## 2011-08-26 MED ORDER — OXYCODONE-ACETAMINOPHEN 5-325 MG PO TABS
1.0000 | ORAL_TABLET | Freq: Once | ORAL | Status: AC
  Administered 2011-08-26: 1 via ORAL
  Filled 2011-08-26: qty 1

## 2011-08-26 MED ORDER — DIPHENHYDRAMINE HCL 50 MG/ML IJ SOLN
12.5000 mg | Freq: Once | INTRAMUSCULAR | Status: AC
  Administered 2011-08-26: 12.5 mg via INTRAVENOUS
  Filled 2011-08-26: qty 1

## 2011-08-26 MED ORDER — HYDROMORPHONE HCL PF 1 MG/ML IJ SOLN
1.0000 mg | Freq: Once | INTRAMUSCULAR | Status: AC
  Administered 2011-08-26: 1 mg via INTRAVENOUS
  Filled 2011-08-26: qty 1

## 2011-08-26 NOTE — ED Notes (Signed)
Pt reports cough and chest pain since November 20 associated with coughing up blood and thick yellow sputum. Pain is worse with deep inspiration.

## 2011-08-26 NOTE — ED Notes (Signed)
Patient transported to [facility].  Family in room.  Will await pt return.

## 2011-08-26 NOTE — ED Provider Notes (Signed)
Medical screening examination/treatment/procedure(s) were performed by non-physician practitioner and as supervising physician I was immediately available for consultation/collaboration. Felice Deem Y.   Chitara Clonch Y. Sharleen Szczesny, MD 08/26/11 [id number] 

## 2011-08-26 NOTE — ED Provider Notes (Signed)
History     CSN: [id number]  Arrival date & time 08/26/11  1500   First MD Initiated Contact with Patient 08/26/11 1626      Chief Complaint  Patient presents with  . Cough    (Consider location/radiation/quality/duration/timing/severity/associated sxs/prior treatment) Patient is a 45 y.o. male presenting with cough. The history is provided by the patient and the spouse.  Cough This is a new problem. Episode onset: 1 month ago. The problem occurs every few minutes. The problem has not changed since onset.The cough is productive of blood-tinged sputum. Maximum temperature: intermittent chills but no known fever. Associated symptoms include chills and shortness of breath. Pertinent negatives include no sweats, no weight loss, no ear congestion, no ear pain, no headaches, no sore throat and no wheezing. Associated symptoms comments: Pleuritic chest pain. Treatments tried: abx, inhaler. The treatment provided no relief. He is not a smoker. His past medical history is significant for pneumonia.   patient has seen his primary doctor for this problem and was diagnosed with bronchitis. He has been treated both with a Z-Pak and with an Avelox prescription. There has been no relief after either of these. A chest x-ray was performed on 08/16/2011 the patient reports he does not know the results of this chest x-ray. He reports a recent trip of 3 hours in the car each way. Denies any history of blood clot or any known family history of blood clots or clotting disorders.  Past Medical History  Diagnosis Date  . Hypertension   . Depressive disorder, not elsewhere classified   . Osteoarthrosis, unspecified whether generalized or localized, unspecified site   . Unspecified essential hypertension   . Personal history of colonic polyps   . Irritable bowel syndrome   . Esophageal reflux   . Colon polyps     hyperplastic/  . Allergic rhinitis   . Pancreatitis   . History of pneumonia   . Insomnia   .  Anxiety   . Diverticulitis     Past Surgical History  Procedure Date  . Tibula surgery     right  . Fibula fracture surgery     right    Family History  Problem Relation Age of Onset  . Heart disease Mother   . Gallbladder disease Mother   . Nephrolithiasis Mother   . Colon cancer Neg Hx   . Hypertension Father     moth  . Arthritis Other   . Hyperlipidemia Other   . Stroke Other     History  Substance Use Topics  . Smoking status: Former Smoker    Types: Cigarettes  . Smokeless tobacco: Current User    Types: Snuff  . Alcohol Use: No      Review of Systems  Constitutional: Positive for chills. Negative for fever, weight loss and unexpected weight change.  HENT: Negative for hearing loss, ear pain, nosebleeds, congestion, sore throat, neck pain and neck stiffness.   Eyes: Negative for pain and visual disturbance.  Respiratory: Positive for cough and shortness of breath. Negative for chest tightness and wheezing.   Cardiovascular: Negative for palpitations and leg swelling.       Pleuritic chest pain  Gastrointestinal: Negative for nausea, vomiting and abdominal pain.  Genitourinary: Negative for dysuria, hematuria and flank pain.  Musculoskeletal: Negative for back pain, joint swelling and gait problem.  Skin: Negative for rash and wound.  Neurological: Negative for seizures, syncope, weakness and headaches.  Psychiatric/Behavioral: Negative for confusion.    Allergies  Codeine; Vicodin; Benazepril; and Ativan  Home Medications   Current Outpatient Rx  Name Route Sig Dispense Refill  . ACETAMINOPHEN 500 MG PO TABS Oral Take 1,000 mg by mouth every 6 (six) hours as needed. PAIN     . ACLIDINIUM BROMIDE 400 MCG/ACT IN AEPB Inhalation Inhale 1 Act into the lungs 2 (two) times daily. 1 each 11  . CITALOPRAM HYDROBROMIDE 20 MG PO TABS Oral Take 20 mg by mouth at bedtime as needed.     Marland Kitchen DIAZEPAM 5 MG PO TABS Oral Take 5 mg by mouth 2 (two) times daily as  needed.     Marland Kitchen HYDROCHLOROTHIAZIDE 12.5 MG PO CAPS Oral Take 12.5 mg by mouth daily.     Marland Kitchen OLMESARTAN MEDOXOMIL 40 MG PO TABS Oral Take 1 tablet (40 mg total) by mouth daily. 56 tablet 0  . OMEPRAZOLE 20 MG PO CPDR Oral Take 20 mg by mouth 2 (two) times daily.      Marland Kitchen PROBIOTIC PO Oral Take 1 capsule by mouth daily.     . TRAMADOL HCL 50 MG PO TABS Oral Take 1 tablet (50 mg total) by mouth every 6 (six) hours as needed for pain. Maximum dose= 8 tablets per day 65 tablet 6    BP 105/70  Pulse 109  Temp(Src) 98.1 F (36.7 C) (Oral)  Resp 18  SpO2 95%  Physical Exam  Constitutional: He is oriented to person, place, and time. He appears well-developed and well-nourished. No distress.  HENT:  Head: Normocephalic and atraumatic.  Right Ear: External ear normal.  Left Ear: External ear normal.  Nose: Nose normal.  Mouth/Throat: Oropharynx is clear and moist. No oropharyngeal exudate.  Eyes: Conjunctivae are normal. Pupils are equal, round, and reactive to light.  Neck: Normal range of motion. Neck supple.  Cardiovascular: Regular rhythm and intact distal pulses.   No murmur heard.      tachycardia  Pulmonary/Chest: Effort normal and breath sounds normal. No respiratory distress. He has no wheezes. He exhibits no tenderness.  Abdominal: Soft. Bowel sounds are normal. There is no tenderness.  Musculoskeletal: Normal range of motion. He exhibits no edema and no tenderness.  Lymphadenopathy:    He has no cervical adenopathy.  Neurological: He is alert and oriented to person, place, and time. No cranial nerve deficit. Coordination normal.  Skin: Skin is warm and dry. No rash noted. He is not diaphoretic.    ED Course  Procedures (including critical care time)  Labs Reviewed  POCT I-STAT, CHEM 8 - Abnormal; Notable for the following:    BUN 29 (*)    Creatinine, Ser 2.20 (*)    Glucose, Bld 104 (*)    All other components within normal limits  CBC - Abnormal; Notable for the following:     WBC 3.3 (*)    HCT 38.8 (*)    All other components within normal limits  URINALYSIS, ROUTINE W REFLEX MICROSCOPIC - Abnormal; Notable for the following:    APPearance CLOUDY (*)    All other components within normal limits  BASIC METABOLIC PANEL - Abnormal; Notable for the following:    Sodium 134 (*)    BUN 30 (*)    Creatinine, Ser 2.13 (*)    GFR calc non Af Amer 35 (*)    GFR calc Af Amer 41 (*)    All other components within normal limits  I-STAT, CHEM 8   Dg Chest 2 View  08/26/2011  *RADIOLOGY REPORT*  Clinical Data:  Cough.  Shortness of breath.  Intermittent hemoptysis.  CHEST - 2 VIEW 08/26/2011:  Comparison: Two-view chest x-ray 08/16/2011 [facility], 08/04/2010, 05/16/2010 [facility].  Nuclear medicine VQ scan earlier same date.  Findings: Cardiomediastinal silhouette unremarkable and unchanged. Lungs clear.  No pleural effusions.  Mild degenerative changes involving the lower thoracic spine.  No significant interval change.  IMPRESSION: No acute cardiopulmonary disease.  Stable examination.  Original Report Authenticated By: Arnell Sieving, M.D.   Nm Pulmonary Per & Vent  08/26/2011  *RADIOLOGY REPORT*  Clinical Data:  Chest pain.  Cough.  Wheezing.  Shortness of breath.  Hypoxemia.  Hemoptysis.  Tachycardia.  NUCLEAR MEDICINE VENTILATION - PERFUSION LUNG SCAN 08/26/2011:  Technique:  Wash-in, equilibrium, and wash-out phase ventilation images were obtained using Xe-133 gas.  Perfusion images were obtained in multiple projections after intravenous injection of Tc- 56m MAA.  Radiopharmaceuticals:  11 mCi Xe-133 gas and 6 mCi Tc-47m MAA.  Comparison:  No prior nuclear imaging.  Two-view chest x-ray obtained subsequently same date.  Findings: Ventilation images demonstrate normal wash-in and wash- out without evidence of significant air trapping.  Perfusion images demonstrate homogeneous perfusion throughout both lungs.  No segmental or subsegmental perfusion  defects are identified to suggest pulmonary embolism.  IMPRESSION: Normal examination.  Original Report Authenticated By: Arnell Sieving, M.D.     1. Renal insufficiency   2. Cough       MDM  I reviewed the patient's recent chest x-ray which showed no evidence of pneumonia. Given his cough with hemoptysis, tachycardia, and hypoxia, pulmonary embolism is a possible diagnosis and a CT angiogram of the chest has been ordered for further evaluation.    6:20 PM The patient's BUN and creatinine are elevated from his baseline. A CT angiogram cannot be performed. A V/Q scan will be performed instead to evaluate for pulmonary embolus. A basic metabolic panel will be ordered to confirm the BUN and creatinine on the i-STAT. A CBC has also been ordered as well as a urinalysis for further evaluation. Awaiting results of VQ scan to determine further treatment plan.    10:43 PM Attempted to speak with PMD or on-call physician for PMD regarding pt status for continuity of care and to determine best plan given mild renal insufficiency compared to baseline. Secretary has paged and spoken to answering service for primary doctor with no call back.     11:51 PM Pt to be discharged home with instructions to follow-up closely with PMD for recheck of BUN/SCr.  Elwyn Reach [place], [place] 08/26/11 2351

## 2011-08-26 NOTE — ED Notes (Signed)
Pt c/o itching with meds. Pt medicated.

## 2011-08-26 NOTE — ED Notes (Signed)
Pt reports bronchitis for over a month. Was given Z-pak and avelox, no change. Had chest xray performed 12/20, has not received results yet. Also using Turdorza inhaler without relief.

## 2011-08-26 NOTE — ED Notes (Signed)
Drink given per request.  PA notified and fluids permitted.

## 2011-08-27 ENCOUNTER — Other Ambulatory Visit (INDEPENDENT_AMBULATORY_CARE_PROVIDER_SITE_OTHER): Payer: Self-pay

## 2011-08-27 ENCOUNTER — Other Ambulatory Visit: Payer: Self-pay | Admitting: *Deleted

## 2011-08-27 DIAGNOSIS — N289 Disorder of kidney and ureter, unspecified: Secondary | ICD-10-CM

## 2011-08-27 LAB — BASIC METABOLIC PANEL
CO2: 26 mEq/L (ref 19–32)
Chloride: 102 mEq/L (ref 96–112)
Glucose, Bld: 106 mg/dL — ABNORMAL HIGH (ref 70–99)
Sodium: 137 mEq/L (ref 135–145)

## 2011-08-27 NOTE — Progress Notes (Signed)
Pt seen at [facility] long ER 12.30.12

## 2011-08-29 ENCOUNTER — Encounter: Payer: Self-pay | Admitting: Internal Medicine

## 2011-08-29 ENCOUNTER — Ambulatory Visit (INDEPENDENT_AMBULATORY_CARE_PROVIDER_SITE_OTHER): Payer: Self-pay | Admitting: Internal Medicine

## 2011-08-29 VITALS — BP 148/100 | HR 100 | Temp 98.1°F

## 2011-08-29 DIAGNOSIS — J45909 Unspecified asthma, uncomplicated: Secondary | ICD-10-CM

## 2011-08-29 DIAGNOSIS — R05 Cough: Secondary | ICD-10-CM

## 2011-08-29 MED ORDER — IPRATROPIUM-ALBUTEROL 18-103 MCG/ACT IN AERO: 2.0000 | INHALATION_SPRAY | Freq: Four times a day (QID) | RESPIRATORY_TRACT | Status: DC | PRN

## 2011-08-29 MED ORDER — BENZONATATE 200 MG PO CAPS: 200.0000 mg | ORAL_CAPSULE | Freq: Two times a day (BID) | ORAL | Status: DC | PRN

## 2011-08-29 MED ORDER — PREDNISONE (PAK) 10 MG PO TABS: 10.0000 mg | ORAL_TABLET | ORAL | Status: DC

## 2011-08-29 NOTE — Patient Instructions (Signed)
It was good to see you today. We have reviewed your prior records including recent labs and tests today Use Prednisone taper x 6 days, tessalon for cough suppression and Combivent inhaler 4x/day - Your prescription(s) have been submitted to your pharmacy. Please take as directed and contact our office if you believe you are having problem(s) with the medication(s). Please schedule followup in 2 weeks with Dr. Yetta Barre, call sooner if problems. Will let Dr Yetta Barre know about your requested refills

## 2011-08-29 NOTE — Progress Notes (Signed)
Subjective:    Patient ID: Martin Singh, male    DOB: [birth year], 46 y.o.   MRN: [id number]  HPI  complains of continued cough Seen by PCP for same x 2 in last 3 weeks -  unimproved with OTC meds, empiric abx and albuterol MDI  Past Medical History  Diagnosis Date  . Hypertension   . Depressive disorder, not elsewhere classified   . Osteoarthrosis, unspecified whether generalized or localized, unspecified site   . Irritable bowel syndrome   . Esophageal reflux   . Colon polyps     hyperplastic/  . Allergic rhinitis   . Pancreatitis   . History of pneumonia   . Insomnia   . Anxiety   . Diverticulitis     Review of Systems  Constitutional: Negative for fever and fatigue.  Respiratory: Positive for cough, shortness of breath and wheezing.   Musculoskeletal: Positive for back pain (chronic).       Objective:   Physical Exam BP 148/100  Pulse 100  Temp(Src) 98.1 F (36.7 C) (Oral)  SpO2 98% Gen: NAD HENT: OP red but no exudate or PND Lungs: dry wheeze exp>inspiration - no crackles CV: RRR, no edema  Lab Results  Component Value Date   WBC 3.3* 08/26/2011   HGB 13.7 08/26/2011   HCT 38.8* 08/26/2011   PLT 313 08/26/2011   GLUCOSE 106* 08/27/2011   CHOL  Value: 141        ATP III CLASSIFICATION:  <200     mg/dL   Desirable  409-811  mg/dL   Borderline High  >=914    mg/dL   High        7/82/9562   TRIG 158* 05/22/2010   HDL 59 05/09/2010   LDLCALC  Value: 34        Total Cholesterol/HDL:CHD Risk Coronary Heart Disease Risk Table                     Men   Women  1/2 Average Risk   3.4   3.3  Average Risk       5.0   4.4  2 X Average Risk   9.6   7.1  3 X Average Risk  23.4   11.0        Use the calculated Patient Ratio above and the CHD Risk Table to determine the patient's CHD Risk.        ATP III CLASSIFICATION (LDL):  <100     mg/dL   Optimal  130-865  mg/dL   Near or Above                    Optimal  130-159  mg/dL   Borderline  784-696  mg/dL   High  >295     mg/dL    Very High 2/84/1324   ALT 41 12/25/2010   AST 44* 12/25/2010   NA 137 08/27/2011   K 4.2 08/27/2011   CL 102 08/27/2011   CREATININE 1.8* 08/27/2011   BUN 25* 08/27/2011   CO2 26 08/27/2011   INR 1.05 05/07/2010   Dg Chest 2 View  08/26/2011  *RADIOLOGY REPORT*  Clinical Data: Cough.  Shortness of breath.  Intermittent hemoptysis.  CHEST - 2 VIEW 08/26/2011:  Comparison: Two-view chest x-ray 08/16/2011 [facility], 08/04/2010, 05/16/2010 [facility].  Nuclear medicine VQ scan earlier same date.  Findings: Cardiomediastinal silhouette unremarkable and unchanged. Lungs clear.  No pleural effusions.  Mild degenerative changes  involving the lower thoracic spine.  No significant interval change.  IMPRESSION: No acute cardiopulmonary disease.  Stable examination.  Original Report Authenticated By: Arnell Sieving, M.D.   Nm Pulmonary Per & Vent  08/26/2011  *RADIOLOGY REPORT*  Clinical Data:  Chest pain.  Cough.  Wheezing.  Shortness of breath.  Hypoxemia.  Hemoptysis.  Tachycardia.  NUCLEAR MEDICINE VENTILATION - PERFUSION LUNG SCAN 08/26/2011:  Technique:  Wash-in, equilibrium, and wash-out phase ventilation images were obtained using Xe-133 gas.  Perfusion images were obtained in multiple projections after intravenous injection of Tc- 83m MAA.  Radiopharmaceuticals:  11 mCi Xe-133 gas and 6 mCi Tc-66m MAA.  Comparison:  No prior nuclear imaging.  Two-view chest x-ray obtained subsequently same date.  Findings: Ventilation images demonstrate normal wash-in and wash- out without evidence of significant air trapping.  Perfusion images demonstrate homogeneous perfusion throughout both lungs.  No segmental or subsegmental perfusion defects are identified to suggest pulmonary embolism.  IMPRESSION: Normal examination.  Original Report Authenticated By: Arnell Sieving, M.D.      Assessment & Plan:  Cough - chronic - recent imaging reviewed - no ASD, effusion or PE tx bronchospasm  component with pred taper, tessalon and add combivent MDI Also continue PPI F/u PCP in next  2 weeks

## 2011-08-31 ENCOUNTER — Telehealth: Payer: Self-pay

## 2011-08-31 NOTE — Telephone Encounter (Signed)
Pt's spouse called because she is concerned that pt's BP and heart rate has been going up (see last OV with VAL). This morning pt's BP 144/104 HR 119. Please advise, spouse is concerned that prednisone may be the cause?

## 2011-08-31 NOTE — Telephone Encounter (Signed)
Returned back to wife and advised  Per MD. She then stated that patient is also running a fever and having urinary incont. Patient wife transferred to set up appt

## 2011-08-31 NOTE — Telephone Encounter (Signed)
Yes, prednisone can do that, he needs to have those numbers rechecked here in the office

## 2011-09-03 ENCOUNTER — Ambulatory Visit (INDEPENDENT_AMBULATORY_CARE_PROVIDER_SITE_OTHER): Payer: Self-pay | Admitting: Internal Medicine

## 2011-09-03 ENCOUNTER — Encounter: Payer: Self-pay | Admitting: Internal Medicine

## 2011-09-03 ENCOUNTER — Other Ambulatory Visit (INDEPENDENT_AMBULATORY_CARE_PROVIDER_SITE_OTHER): Payer: Self-pay

## 2011-09-03 ENCOUNTER — Telehealth: Payer: Self-pay | Admitting: Internal Medicine

## 2011-09-03 DIAGNOSIS — Z862 Personal history of diseases of the blood and blood-forming organs and certain disorders involving the immune mechanism: Secondary | ICD-10-CM

## 2011-09-03 DIAGNOSIS — I10 Essential (primary) hypertension: Secondary | ICD-10-CM

## 2011-09-03 DIAGNOSIS — R000 Tachycardia, unspecified: Secondary | ICD-10-CM

## 2011-09-03 DIAGNOSIS — Z8639 Personal history of other endocrine, nutritional and metabolic disease: Secondary | ICD-10-CM

## 2011-09-03 DIAGNOSIS — R079 Chest pain, unspecified: Secondary | ICD-10-CM | POA: Insufficient documentation

## 2011-09-03 DIAGNOSIS — F329 Major depressive disorder, single episode, unspecified: Secondary | ICD-10-CM

## 2011-09-03 DIAGNOSIS — M545 Low back pain: Secondary | ICD-10-CM

## 2011-09-03 DIAGNOSIS — R05 Cough: Secondary | ICD-10-CM

## 2011-09-03 LAB — CBC WITH DIFFERENTIAL/PLATELET
Basophils Absolute: 0 10*3/uL (ref 0.0–0.1)
Basophils Relative: 0.3 % (ref 0.0–3.0)
Eosinophils Absolute: 0 10*3/uL (ref 0.0–0.7)
Hemoglobin: 13.6 g/dL (ref 13.0–17.0)
Lymphs Abs: 1.3 10*3/uL (ref 0.7–4.0)
MCHC: 35.3 g/dL (ref 30.0–36.0)
MCV: 94.1 fl (ref 78.0–100.0)
Monocytes Absolute: 1.1 10*3/uL — ABNORMAL HIGH (ref 0.1–1.0)
Neutro Abs: 6.7 10*3/uL (ref 1.4–7.7)
RBC: 4.08 Mil/uL — ABNORMAL LOW (ref 4.22–5.81)
RDW: 13.7 % (ref 11.5–14.6)

## 2011-09-03 LAB — URINALYSIS, ROUTINE W REFLEX MICROSCOPIC
Ketones, ur: NEGATIVE
Specific Gravity, Urine: <=1.005 (ref 1.000–1.030)
Total Protein, Urine: NEGATIVE
Urine Glucose: NEGATIVE
Urobilinogen, UA: 0.2 (ref 0.0–1.0)

## 2011-09-03 LAB — BRAIN NATRIURETIC PEPTIDE: Pro B Natriuretic peptide (BNP): 41 pg/mL (ref 0.0–100.0)

## 2011-09-03 LAB — TSH: TSH: 1.28 u[IU]/mL (ref 0.35–5.50)

## 2011-09-03 MED ORDER — DIAZEPAM 5 MG PO TABS: 5.0000 mg | ORAL_TABLET | Freq: Three times a day (TID) | ORAL | Status: DC | PRN

## 2011-09-03 MED ORDER — BUDESONIDE 180 MCG/ACT IN AEPB: 2.0000 | INHALATION_SPRAY | Freq: Two times a day (BID) | RESPIRATORY_TRACT | Status: DC

## 2011-09-03 MED ORDER — NEBIVOLOL HCL 5 MG PO TABS: 5.0000 mg | ORAL_TABLET | Freq: Every day | ORAL | Status: DC

## 2011-09-03 NOTE — Telephone Encounter (Signed)
I want him to stop taking percocet

## 2011-09-03 NOTE — Telephone Encounter (Signed)
Per fiancee/[name] need prescription for percocet--Did not get prescription today at appt per fiancee.  Pt ph#--[phone]

## 2011-09-03 NOTE — Patient Instructions (Signed)
Chest Pain (Nonspecific) It is often hard to give a specific diagnosis for the cause of chest pain. There is always a chance that your pain could be related to something serious, such as a heart attack or a blood clot in the lungs. You need to follow up with your caregiver for further evaluation. CAUSES   Heartburn.   Pneumonia or bronchitis.   Anxiety and stress.   Inflammation around your heart (pericarditis) or lung (pleuritis or pleurisy).   A blood clot in the lung.   A collapsed lung (pneumothorax). It can develop suddenly on its own (spontaneous pneumothorax) or from injury (trauma) to the chest.  The chest wall is composed of bones, muscles, and cartilage. Any of these can be the source of the pain.  The bones can be bruised by injury.   The muscles or cartilage can be strained by coughing or overwork.   The cartilage can be affected by inflammation and become sore (costochondritis).  DIAGNOSIS  Lab tests or other studies, such as X-rays, an EKG, stress testing, or cardiac imaging, may be needed to find the cause of your pain.  TREATMENT   Treatment depends on what may be causing your chest pain. Treatment may include:   Acid blockers for heartburn.   Anti-inflammatory medicine.   Pain medicine for inflammatory conditions.   Antibiotics if an infection is present.   You may be advised to change lifestyle habits. This includes stopping smoking and avoiding caffeine and chocolate.   You may be advised to keep your head raised (elevated) when sleeping. This reduces the chance of acid going backward from your stomach into your esophagus.   Most of the time, nonspecific chest pain will improve within 2 to 3 days with rest and mild pain medicine.  HOME CARE INSTRUCTIONS   If antibiotics were prescribed, take the full amount even if you start to feel better.   For the next few days, avoid physical activities that bring on chest pain. Continue physical activities as  directed.   Do not smoke cigarettes or drink alcohol until your symptoms are gone.   Only take over-the-counter or prescription medicine for pain, discomfort, or fever as directed by your caregiver.   Follow your caregiver's suggestions for further testing if your chest pain does not go away.   Keep any follow-up appointments you made. If you do not go to an appointment, you could develop lasting (chronic) problems with pain. If there is any problem keeping an appointment, you must call to reschedule.  SEEK MEDICAL CARE IF:   You think you are having problems from the medicine you are taking. Read your medicine instructions carefully.   Your chest pain does not go away, even after treatment.   You develop a rash with blisters on your chest.  SEEK IMMEDIATE MEDICAL CARE IF:   You have increased chest pain or pain that spreads to your arm, neck, jaw, back, or belly (abdomen).   You develop shortness of breath, an increasing cough, or you are coughing up blood.   You have severe back or abdominal pain, feel sick to your stomach (nauseous) or throw up (vomit).   You develop severe weakness, fainting, or chills.   You have an oral temperature above 102 F (38.9 C), not controlled by medicine.  THIS IS AN EMERGENCY. Do not wait to see if the pain will go away. Get medical help at once. Call your local emergency services (911 in [place] Do not drive yourself to   the hospital. MAKE SURE YOU:   Understand these instructions.   Will watch your condition.   Will get help right away if you are not doing well or get worse.  Document Released: 05/23/2005 Document Revised: 04/25/2011 Document Reviewed: 03/18/2008 Exit[employer] Patient Information 2012 [employer]. 

## 2011-09-03 NOTE — Progress Notes (Signed)
Subjective:    Patient ID: Martin Singh, male    DOB: December 16, 1965, 46 y.o.   MRN: [id number]  HPI He returns for f/up and complains of persistent cough despite multiple treatments and normal CXR's. He is still using the [place]  (he did not start the combivent as directed by another provider) with only minimal relief. Since taking prednisone he has noticed an increased heart rate up to 110-120 and his BP has been high and he tells me that the systemic steroids have not helped much. He has also had some atypical chest pain at rest with dizziness and SOB but no DOE or edema. On a recent visit to the ER it was found that his BUN and Creat had increased. His low back pain has improved and it does not radiate down his legs anymore.   Review of Systems  Constitutional: Negative for fever, chills, diaphoresis, activity change, appetite change, fatigue and unexpected weight change.  HENT: Negative.   Eyes: Negative.   Respiratory: Positive for cough. Negative for apnea, choking, chest tightness, wheezing and stridor.   Cardiovascular: Negative for chest pain, palpitations and leg swelling.  Gastrointestinal: Negative for nausea, vomiting, abdominal pain, diarrhea, constipation, blood in stool and anal bleeding.  Genitourinary: Negative for dysuria, urgency, frequency, hematuria, flank pain, decreased urine volume, enuresis and difficulty urinating.  Musculoskeletal: Positive for back pain (this has improved). Negative for myalgias, joint swelling, arthralgias and gait problem.  Skin: Negative for color change, pallor, rash and wound.  Neurological: Negative for dizziness, tremors, seizures, syncope, facial asymmetry, speech difficulty, weakness, light-headedness, numbness and headaches.  Hematological: Negative for adenopathy. Does not bruise/bleed easily.  Psychiatric/Behavioral: Negative.        Objective:   Physical Exam  Vitals reviewed. Constitutional: He is oriented to person, place, and  time. He appears well-developed and well-nourished. No distress.  HENT:  Head: Normocephalic and atraumatic.  Mouth/Throat: Oropharynx is clear and moist. No oropharyngeal exudate.  Eyes: Conjunctivae are normal. Right eye exhibits no discharge. Left eye exhibits no discharge. No scleral icterus.  Neck: Normal range of motion. Neck supple. No JVD present. No tracheal deviation present. No thyromegaly present.  Cardiovascular: Normal rate, regular rhythm, normal heart sounds and intact distal pulses.  Exam reveals no gallop and no friction rub.   No murmur heard. Pulmonary/Chest: Effort normal and breath sounds normal. No stridor. No respiratory distress. He has no wheezes. He has no rales. He exhibits no tenderness.  Abdominal: Soft. Bowel sounds are normal. He exhibits no distension and no mass. There is no tenderness. There is no rebound and no guarding.  Musculoskeletal: Normal range of motion. He exhibits no edema and no tenderness.  Lymphadenopathy:    He has no cervical adenopathy.  Neurological: He is oriented to person, place, and time.  Skin: Skin is warm and dry. No rash noted. He is not diaphoretic. No erythema. No pallor.  Psychiatric: He has a normal mood and affect. His behavior is normal. Judgment and thought content normal.      Lab Results  Component Value Date   WBC 3.3* 08/26/2011   HGB 13.7 08/26/2011   HCT 38.8* 08/26/2011   PLT 313 08/26/2011   GLUCOSE 106* 08/27/2011   CHOL  Value: 141        ATP III CLASSIFICATION:  <200     mg/dL   Desirable  409-811  mg/dL   Borderline High  >=914    mg/dL   High  05/22/2010   TRIG 158* 05/22/2010   HDL 59 05/09/2010   LDLCALC  Value: 34        Total Cholesterol/HDL:CHD Risk Coronary Heart Disease Risk Table                     Men   Women  1/2 Average Risk   3.4   3.3  Average Risk       5.0   4.4  2 X Average Risk   9.6   7.1  3 X Average Risk  23.4   11.0        Use the calculated Patient Ratio above and the CHD Risk  Table to determine the patient's CHD Risk.        ATP III CLASSIFICATION (LDL):  <100     mg/dL   Optimal  045-409  mg/dL   Near or Above                    Optimal  130-159  mg/dL   Borderline  811-914  mg/dL   High  >782     mg/dL   Very High 9/56/2130   ALT 41 12/25/2010   AST 44* 12/25/2010   NA 137 08/27/2011   K 4.2 08/27/2011   CL 102 08/27/2011   CREATININE 1.8* 08/27/2011   BUN 25* 08/27/2011   CO2 26 08/27/2011   INR 1.05 05/07/2010      Assessment & Plan:

## 2011-09-04 ENCOUNTER — Encounter: Payer: Self-pay | Admitting: Internal Medicine

## 2011-09-04 ENCOUNTER — Telehealth: Payer: Self-pay | Admitting: *Deleted

## 2011-09-04 LAB — COMPREHENSIVE METABOLIC PANEL
ALT: 353 U/L — ABNORMAL HIGH (ref 0–53)
Albumin: 4 g/dL (ref 3.5–5.2)
BUN: 30 mg/dL — ABNORMAL HIGH (ref 6–23)
Creatinine, Ser: 1.3 mg/dL (ref 0.4–1.5)
GFR: 61.96 mL/min (ref >60.00)
Total Protein: 7.3 g/dL (ref 6.0–8.3)

## 2011-09-04 LAB — DRUGS OF ABUSE SCREEN W/O ALC, ROUTINE URINE
Amphetamine Screen, Ur: NEGATIVE
Barbiturate Quant, Ur: NEGATIVE
Creatinine,U: 37 mg/dL
Methadone: NEGATIVE
Opiate Screen, Urine: NEGATIVE

## 2011-09-04 NOTE — Assessment & Plan Note (Signed)
I will repeat his LFT's today, his previous viral profile was negative, I suspect that this is fatty liver disease

## 2011-09-04 NOTE — Assessment & Plan Note (Signed)
EKG is normal today, I think this is related to the prednisone

## 2011-09-04 NOTE — Assessment & Plan Note (Signed)
His EKG is normal today, I will check labs today to look for PE,CHF

## 2011-09-04 NOTE — Assessment & Plan Note (Signed)
Recheck LFTs today 

## 2011-09-04 NOTE — Telephone Encounter (Signed)
FYI: Karen from [facility] called to report that there were incorrect results issued and that they are doing a ReAgent issue on ALT. Patients correct ALT value: 353

## 2011-09-04 NOTE — Assessment & Plan Note (Signed)
This has improved so I have told him to stop taking percocet

## 2011-09-04 NOTE — Assessment & Plan Note (Signed)
This has become a recurrent, almost chronic problem with no resolution despite multiple visits and treatments, I will try him on an inhaled steroid and stop the systemic steroids and have asked him to see pulmonary for further evaluation and treatment

## 2011-09-04 NOTE — Assessment & Plan Note (Signed)
His BP has not been well controlled so I have added bystolic to his regimen for better BP reduction and have asked him to stop the prednisone, also I will recheck his renal function and lytes today

## 2011-09-04 NOTE — Telephone Encounter (Signed)
Patient notified per MD that he will not longer write percocet rx.  Patient then request rx for promethazine codeine 6.25-10mg  to help with cough. He states that he has had this in the past and it helped. Thanks

## 2011-09-05 ENCOUNTER — Emergency Department (HOSPITAL_COMMUNITY)
Admission: EM | Admit: 2011-09-05 | Discharge: 2011-09-06 | Disposition: A | Discharge status: Home / Self Care | Payer: Self-pay | Attending: Emergency Medicine | Admitting: Emergency Medicine

## 2011-09-05 ENCOUNTER — Encounter (HOSPITAL_COMMUNITY): Payer: Self-pay | Admitting: *Deleted

## 2011-09-05 ENCOUNTER — Telehealth: Payer: Self-pay

## 2011-09-05 ENCOUNTER — Other Ambulatory Visit: Payer: Self-pay

## 2011-09-05 DIAGNOSIS — R05 Cough: Secondary | ICD-10-CM | POA: Insufficient documentation

## 2011-09-05 DIAGNOSIS — F1010 Alcohol abuse, uncomplicated: Secondary | ICD-10-CM | POA: Insufficient documentation

## 2011-09-05 DIAGNOSIS — IMO0001 Reserved for inherently not codable concepts without codable children: Secondary | ICD-10-CM | POA: Insufficient documentation

## 2011-09-05 DIAGNOSIS — F10929 Alcohol use, unspecified with intoxication, unspecified: Secondary | ICD-10-CM

## 2011-09-05 DIAGNOSIS — M1990 Unspecified osteoarthritis, unspecified site: Secondary | ICD-10-CM | POA: Insufficient documentation

## 2011-09-05 DIAGNOSIS — R5381 Other malaise: Secondary | ICD-10-CM | POA: Insufficient documentation

## 2011-09-05 DIAGNOSIS — R55 Syncope and collapse: Secondary | ICD-10-CM | POA: Insufficient documentation

## 2011-09-05 DIAGNOSIS — J40 Bronchitis, not specified as acute or chronic: Secondary | ICD-10-CM | POA: Insufficient documentation

## 2011-09-05 DIAGNOSIS — Z79899 Other long term (current) drug therapy: Secondary | ICD-10-CM | POA: Insufficient documentation

## 2011-09-05 DIAGNOSIS — F341 Dysthymic disorder: Secondary | ICD-10-CM | POA: Insufficient documentation

## 2011-09-05 DIAGNOSIS — E860 Dehydration: Secondary | ICD-10-CM | POA: Insufficient documentation

## 2011-09-05 DIAGNOSIS — R32 Unspecified urinary incontinence: Secondary | ICD-10-CM | POA: Insufficient documentation

## 2011-09-05 DIAGNOSIS — K219 Gastro-esophageal reflux disease without esophagitis: Secondary | ICD-10-CM | POA: Insufficient documentation

## 2011-09-05 DIAGNOSIS — R059 Cough, unspecified: Secondary | ICD-10-CM | POA: Insufficient documentation

## 2011-09-05 DIAGNOSIS — I10 Essential (primary) hypertension: Secondary | ICD-10-CM | POA: Insufficient documentation

## 2011-09-05 DIAGNOSIS — K589 Irritable bowel syndrome without diarrhea: Secondary | ICD-10-CM | POA: Insufficient documentation

## 2011-09-05 DIAGNOSIS — R071 Chest pain on breathing: Secondary | ICD-10-CM | POA: Insufficient documentation

## 2011-09-05 LAB — URINE CULTURE: Culture: NO GROWTH

## 2011-09-05 LAB — URINALYSIS, ROUTINE W REFLEX MICROSCOPIC
Hgb urine dipstick: NEGATIVE
Leukocytes, UA: NEGATIVE
Nitrite: NEGATIVE
Protein, ur: NEGATIVE mg/dL
Specific Gravity, Urine: 1.019 (ref 1.005–1.030)
Urobilinogen, UA: 0.2 mg/dL (ref 0.0–1.0)

## 2011-09-05 LAB — BASIC METABOLIC PANEL WITH GFR
BUN: 41 mg/dL — ABNORMAL HIGH (ref 6–23)
CO2: 27 meq/L (ref 19–32)
Calcium: 8.5 mg/dL (ref 8.4–10.5)
Chloride: 108 meq/L (ref 96–112)
Creatinine, Ser: 1.38 mg/dL — ABNORMAL HIGH (ref 0.50–1.35)
GFR calc Af Amer: 69 mL/min — ABNORMAL LOW
GFR calc non Af Amer: 60 mL/min — ABNORMAL LOW
Glucose, Bld: 100 mg/dL — ABNORMAL HIGH (ref 70–99)
Potassium: 4.1 meq/L (ref 3.5–5.1)
Sodium: 144 meq/L (ref 135–145)

## 2011-09-05 LAB — DIFFERENTIAL
Basophils Absolute: 0 10*3/uL (ref 0.0–0.1)
Basophils Relative: 0 % (ref 0–1)
Eosinophils Absolute: 0.2 10*3/uL (ref 0.0–0.7)
Eosinophils Relative: 2 % (ref 0–5)
Lymphs Abs: 3.9 10*3/uL (ref 0.7–4.0)
Neutrophils Relative %: 36 % — ABNORMAL LOW (ref 43–77)

## 2011-09-05 LAB — D-DIMER, QUANTITATIVE: D-Dimer, Quant: 0.35 ug{FEU}/mL (ref 0.00–0.48)

## 2011-09-05 LAB — CBC
MCH: 31.7 pg (ref 26.0–34.0)
MCV: 94.1 fL (ref 78.0–100.0)
Platelets: 261 10*3/uL (ref 150–400)
RBC: 3.75 MIL/uL — ABNORMAL LOW (ref 4.22–5.81)
RDW: 14.2 % (ref 11.5–15.5)

## 2011-09-05 MED ORDER — MOXIFLOXACIN HCL IN NACL 400 MG/250ML IV SOLN
400.0000 mg | Freq: Once | INTRAVENOUS | Status: AC
  Administered 2011-09-05: 400 mg via INTRAVENOUS
  Filled 2011-09-05: qty 250

## 2011-09-05 MED ORDER — ONDANSETRON HCL 4 MG/2ML IJ SOLN
4.0000 mg | Freq: Once | INTRAMUSCULAR | Status: AC
  Administered 2011-09-05: 4 mg via INTRAVENOUS
  Filled 2011-09-05: qty 2

## 2011-09-05 MED ORDER — SODIUM CHLORIDE 0.9 % IV BOLUS (SEPSIS)
500.0000 mL | Freq: Once | INTRAVENOUS | Status: AC
  Administered 2011-09-05: 500 mL via INTRAVENOUS

## 2011-09-05 MED ORDER — SODIUM CHLORIDE 0.9 % IV BOLUS (SEPSIS)
1000.0000 mL | Freq: Once | INTRAVENOUS | Status: AC
  Administered 2011-09-06: 1000 mL via INTRAVENOUS

## 2011-09-05 MED ORDER — MORPHINE SULFATE 4 MG/ML IJ SOLN
4.0000 mg | Freq: Once | INTRAMUSCULAR | Status: AC
  Administered 2011-09-05: 4 mg via INTRAVENOUS
  Filled 2011-09-05: qty 1

## 2011-09-05 MED ORDER — SODIUM CHLORIDE 0.9 % IV SOLN
INTRAVENOUS | Status: DC
  Administered 2011-09-05 (×2): 125 mL/h via INTRAVENOUS

## 2011-09-05 NOTE — ED Notes (Signed)
Attempted to insert in/out cath, met resistance x2.

## 2011-09-05 NOTE — ED Notes (Signed)
Pt voided on his own, urine sample collected

## 2011-09-05 NOTE — ED Notes (Signed)
Pt. Received from home via EMS with c/o cough and fatigue x 1 month, pt. Seen at [facility] and tx with avelox, Z-pack, and prednisone, Pt. States "I black out 2 times yesterday. I feel in the shower" pt. Denies injury

## 2011-09-05 NOTE — Telephone Encounter (Signed)
Please tell him that his blood sugar is mildly elevated and his liver enzymes are high, he needs to get an ultrasound done of his liver ( I have ordered that )

## 2011-09-05 NOTE — Telephone Encounter (Signed)
Pt called stating he has two fainting spells yesterday. Both times pt says he experienced severe dizziness prior to fainting but was only out for a few seconds and was immediately able to get back up. Per pt and spouse, pt did not start new medications from 01/07 OV until today.  Pt is requesting advisement from MD.

## 2011-09-05 NOTE — Telephone Encounter (Signed)
It does not sound like a serious issue to me, he should report any new episodes to me

## 2011-09-05 NOTE — Telephone Encounter (Signed)
Pt's spouse advised of MD's recommendation but had various other concerns. After a lengthy conversation attempting to answer pt's fiance question, she decided to make follow up with [facility]J.

## 2011-09-05 NOTE — ED Provider Notes (Addendum)
History     CSN: [id number]  Arrival date & time 09/05/11  2041   First MD Initiated Contact with Patient 09/05/11 2122      Chief Complaint  Patient presents with  . Cough  . Fatigue  . Loss of Consciousness  . Dizziness  . Nausea    (Consider location/radiation/quality/duration/timing/severity/associated sxs/prior treatment) HPI Martin Singh is a 46 y.o. male presents with c/o cough, myalgia, weakness, syncope leading to desire to be assessed in the ED. The sx(s) have been present for 1 month. Additional concerns are urinary incontinence. Causative factors are none. No known. Palliative factors are none. The distress associated is mild. The disorder has been present for 1 month. He was treated by a physician at [facility] emergency department 2 weeks ago with Zithromax for cough. It did not help. Patient is producing purulent sputum in the emergency department with coughing.   Past Medical History  Diagnosis Date  . Hypertension   . Depressive disorder, not elsewhere classified   . Osteoarthrosis, unspecified whether generalized or localized, unspecified site   . Irritable bowel syndrome   . Esophageal reflux   . Colon polyps     hyperplastic/  . Allergic rhinitis   . Pancreatitis   . History of pneumonia   . Insomnia   . Anxiety   . Diverticulitis     Past Surgical History  Procedure Date  . Tibula surgery     right  . Fibula fracture surgery     right    Family History  Problem Relation Age of Onset  . Heart disease Mother   . Gallbladder disease Mother   . Nephrolithiasis Mother   . Colon cancer Neg Hx   . Hypertension Father     moth  . Arthritis Other   . Hyperlipidemia Other   . Stroke Other     History  Substance Use Topics  . Smoking status: Former Smoker    Types: Cigarettes  . Smokeless tobacco: Current User    Types: Snuff  . Alcohol Use: Yes      Review of Systems  All other systems reviewed and are negative.    Allergies    Codeine; Vicodin; Benazepril; and Ativan  Home Medications   Current Outpatient Rx  Name Route Sig Dispense Refill  . ACETAMINOPHEN 500 MG PO TABS Oral Take 1,000 mg by mouth every 6 (six) hours as needed. For pain    . ACLIDINIUM BROMIDE 400 MCG/ACT IN AEPB Inhalation Inhale 1 puff into the lungs 2 (two) times daily.     Marland Kitchen BENZONATATE 100 MG PO CAPS Oral Take 100 mg by mouth 3 (three) times daily as needed. For cough    . BUDESONIDE 180 MCG/ACT IN AEPB Inhalation Inhale 2 puffs into the lungs 2 (two) times daily. 1 Inhaler 0  . DIAZEPAM 5 MG PO TABS Oral Take 5 mg by mouth every 8 (eight) hours as needed. For anxiety    . HYDROCHLOROTHIAZIDE 12.5 MG PO CAPS Oral Take 12.5 mg by mouth daily.     . NEBIVOLOL HCL 5 MG PO TABS Oral Take 5 mg by mouth daily.    Marland Kitchen OLMESARTAN MEDOXOMIL 40 MG PO TABS Oral Take 1 tablet (40 mg total) by mouth daily. 56 tablet 0  . OMEPRAZOLE 20 MG PO CPDR Oral Take 20 mg by mouth 2 (two) times daily.      Marland Kitchen PROBIOTIC PO Oral Take 1 capsule by mouth daily.     Marland Kitchen  PROMETHAZINE HCL 25 MG PO TABS Oral Take 25 mg by mouth every 8 (eight) hours as needed. For nausea    . PROMETHAZINE-CODEINE 6.25-10 MG/5ML PO SYRP Oral Take 5 mLs by mouth every 4 (four) hours as needed. For nausea    . TRAMADOL HCL 50 MG PO TABS Oral Take 50 mg by mouth every 6 (six) hours as needed. For pain Maximum dose= 8 tablets per day      BP 85/48  Pulse 82  Temp(Src) 98 F (36.7 C) (Oral)  Resp 12  SpO2 96%  Physical Exam  Nursing note and vitals reviewed. Constitutional: He is oriented to person, place, and time. He appears well-developed and well-nourished.  HENT:  Head: Normocephalic and atraumatic.  Right Ear: External ear normal.  Left Ear: External ear normal.  Eyes: Conjunctivae and EOM are normal. Pupils are equal, round, and reactive to light.  Neck: Normal range of motion and phonation normal. Neck supple.  Cardiovascular: Normal rate, regular rhythm, normal heart sounds and  intact distal pulses.   Pulmonary/Chest: Effort normal and breath sounds normal. No respiratory distress. He has no rales. He exhibits no tenderness and no bony tenderness.       Mild left lateral chest wall tenderness. No crepitation or deformity.  Abdominal: Soft. Normal appearance and bowel sounds are normal. There is no tenderness.  Musculoskeletal: Normal range of motion.  Neurological: He is alert and oriented to person, place, and time. He has normal strength. No cranial nerve deficit or sensory deficit. He exhibits normal muscle tone. Coordination normal.  Skin: Skin is warm, dry and intact. No rash noted. No erythema. No pallor.  Psychiatric: He has a normal mood and affect. His behavior is normal. Judgment and thought content normal.    ED Course  Procedures (including critical care time)  Date: 09/05/2011  Rate: 91  Rhythm: normal sinus rhythm  QRS Axis: normal  Intervals: normal  ST/T Wave abnormalities: normal  Conduction Disutrbances:none  Narrative Interpretation:   Old EKG Reviewed: unchanged  Labs Reviewed  CBC - Abnormal; Notable for the following:    RBC 3.75 (*)    Hemoglobin 11.9 (*)    HCT 35.3 (*)    All other components within normal limits  DIFFERENTIAL - Abnormal; Notable for the following:    Neutrophils Relative 36 (*)    Lymphocytes Relative 52 (*)    All other components within normal limits  BASIC METABOLIC PANEL - Abnormal; Notable for the following:    Glucose, Bld 100 (*)    BUN 41 (*)    Creatinine, Ser 1.38 (*)    GFR calc non Af Amer 60 (*)    GFR calc Af Amer 69 (*)    All other components within normal limits  ETHANOL - Abnormal; Notable for the following:    Alcohol, Ethyl (B) 242 (*)    All other components within normal limits  URINE RAPID DRUG SCREEN (HOSP PERFORMED) - Abnormal; Notable for the following:    Opiates POSITIVE (*)    Benzodiazepines POSITIVE (*)    All other components within normal limits  URINALYSIS, ROUTINE W  REFLEX MICROSCOPIC  LACTIC ACID, PLASMA  D-DIMER, QUANTITATIVE  URINE CULTURE   No results found. Emergency department treatment: IV fluids, bolus and drip. IV, Avelox for bronchitis. 00:09- patient is alert, calm, and comfortable. Orthostatic vital signs taken at 2212- show mild hypotension, but no orthostasis- second fluid bolus, has been ordered. The patient states that his girlfriend can come  and get him after he gets feeling better. Care transferred to Dr. Juleen China at this time   1. Alcohol intoxication   2. Dehydration   3. Bronchitis       MDM  Nonspecific weakness, myalgias, and syncope associated with alcohol ingestion. Review of records indicate frequent presentations to the health care system, with alcohol as above 80 mg/dl. . There is mild. Associated dehydration with elevation of BUN and creatinine. These have been present previously.        Flint Melter, MD 09/06/11 0011  Flint Melter, MD 09/06/11 [phone]

## 2011-09-06 ENCOUNTER — Ambulatory Visit: Payer: Self-pay | Admitting: Internal Medicine

## 2011-09-06 LAB — RAPID URINE DRUG SCREEN, HOSP PERFORMED
Barbiturates: NOT DETECTED
Benzodiazepines: POSITIVE — AB
Cocaine: NOT DETECTED
Opiates: POSITIVE — AB
Tetrahydrocannabinol: NOT DETECTED

## 2011-09-06 LAB — BENZODIAZEPINE, QUANTITATIVE, URINE
Alprazolam (GC/LC/MS), ur confirm: NEGATIVE NG/ML
Oxazepam GC/MS Conf: NEGATIVE NG/ML
Temazapam: 512 NG/ML — ABNORMAL HIGH

## 2011-09-06 MED ORDER — BENZONATATE 100 MG PO CAPS: 100.0000 mg | ORAL_CAPSULE | Freq: Three times a day (TID) | ORAL | Status: AC

## 2011-09-06 NOTE — ED Notes (Signed)
Orthostatic BP done

## 2011-09-06 NOTE — ED Provider Notes (Signed)
Pt BP has improved with IVF. No new complaints. Has family coming to pick up and assume care. Pt is HD stable and appropriate for DC at this time.   Raeford Razor, MD 09/06/11 [phone]

## 2011-09-06 NOTE — ED Notes (Signed)
Pt. Discharged to home with family, NAD noted. Pt. ambulated

## 2011-09-06 NOTE — Telephone Encounter (Signed)
Patient was notified 09/04/10

## 2011-09-11 ENCOUNTER — Encounter: Payer: Self-pay | Admitting: Pulmonary Disease

## 2011-09-11 ENCOUNTER — Ambulatory Visit (INDEPENDENT_AMBULATORY_CARE_PROVIDER_SITE_OTHER): Payer: Self-pay | Admitting: Pulmonary Disease

## 2011-09-11 ENCOUNTER — Ambulatory Visit
Admission: RE | Admit: 2011-09-11 | Discharge: 2011-09-11 | Disposition: A | Discharge status: Home / Self Care | Payer: Self-pay | Source: Ambulatory Visit | Attending: Internal Medicine | Admitting: Internal Medicine

## 2011-09-11 VITALS — BP 110/82 | HR 74 | Temp 98.4°F | Ht 62.0 in | Wt 170.2 lb

## 2011-09-11 DIAGNOSIS — R05 Cough: Secondary | ICD-10-CM

## 2011-09-11 NOTE — Assessment & Plan Note (Signed)
The patient has a severe cough with paroxysms that I suspect is upper airway in origin rather than lower.  Most likely he has a cyclical cough after a viral illness.  His lung exam is totally clear to auscultation, and his chest x-ray this month shows no acute process.  I cannot exclude with 100% certainty cough variant asthma, however he is not improved with steroids or inhalers.  I would like to treat him with the cyclical cough protocol, which involves aggressive cough suppression and also behavioral therapies which prevent cough propagation.  Given the severe nature of his cough with regurgitation, it is very likely that acid reflux is playing a role as well.  I will intensify his treatment regimen for this.

## 2011-09-11 NOTE — Progress Notes (Signed)
  Subjective:    Patient ID: Martin Singh, male    DOB: 04/08/1966, 46 y.o.   MRN: [id number]  HPI The patient is a 46 year old male who I have been asked to see for severe chronic cough.  The patient was in his usual state of health until November 20, when he developed what sounds like an upper respiratory infection.  He was treated with a Z-Pak and Avelox, but his cough persisted.  He has also been treated with prednisone and an inhaler, with no improvement in his cough.  The cough can be anywhere from dry to productive of mucus, and the patient states it is more productive than dry.  He can produce mucus that is from clear to yellow, and sometimes it is red-brown in color.  The cough is clearly worsened with conversation, and is often associated with hoarseness.  He describes a tickle in his throat that drives his cough.  He also clears his throat quite a bit.  He denies nasal congestion or pressure, and does not believe that he has sinusitis.  He has severe cough paroxysms that lead to regurgitation/vomiting.  The patient has had a chest x-ray this month which is totally clear.  He has also been tried on anticholinergic therapy with no response.  He has not had her on a tree or pulmonary function studies.   Review of Systems  Constitutional: Positive for appetite change and unexpected weight change. Negative for fever.  HENT: Positive for congestion, sore throat, sneezing, trouble swallowing and dental problem. Negative for ear pain, nosebleeds, rhinorrhea, postnasal drip and sinus pressure.   Eyes: Negative for redness and itching.  Respiratory: Positive for cough and shortness of breath. Negative for chest tightness and wheezing.   Cardiovascular: Positive for chest pain. Negative for palpitations and leg swelling.  Gastrointestinal: Positive for abdominal pain. Negative for nausea and vomiting.  Genitourinary: Negative for dysuria.  Musculoskeletal: Positive for joint swelling.  Skin: Negative  for rash.  Neurological: Positive for headaches.  Hematological: Does not bruise/bleed easily.  Psychiatric/Behavioral: Negative for dysphoric mood. The patient is nervous/anxious.        Objective:   Physical Exam Constitutional:  Well developed, no acute distress  HENT:  Nares patent without discharge, no purulence  Oropharynx without exudate, palate and uvula are normal  Eyes:  Perrla, eomi, no scleral icterus  Neck:  No JVD, no TMG  Cardiovascular:  Normal rate, regular rhythm, no rubs or gallops.  No murmurs        Intact distal pulses  Pulmonary :  Normal breath sounds, no stridor or respiratory distress   No rales, rhonchi, or wheezing  Abdominal:  Soft, nondistended, bowel sounds present.  No tenderness noted.   Musculoskeletal:  No lower extremity edema noted.  Lymph Nodes:  No cervical lymphadenopathy noted  Skin:  No cyanosis noted  Neurologic:  Alert, appropriate, moves all 4 extremities without obvious deficit.         Assessment & Plan:

## 2011-09-11 NOTE — Patient Instructions (Addendum)
Increase omeprazole 20mg  to 2 in am and pm until we can get you thru this See cyclical cough protocol.  Once this is completed, continue voice rest and hard candy as much as possible.  No throat clearing Stop all inhalers. followup with me in 2 weeks.

## 2011-09-12 ENCOUNTER — Telehealth: Payer: Self-pay

## 2011-09-12 ENCOUNTER — Telehealth: Payer: Self-pay | Admitting: Pulmonary Disease

## 2011-09-12 NOTE — Telephone Encounter (Signed)
Martin Singh stated to disregard earlier message.  Rescheduled appt.  Stated nothing further was needed.  [name]

## 2011-09-12 NOTE — Telephone Encounter (Signed)
No documentation in KC's note stating pt is to avoid going outside.  LMOM TCB x1.

## 2011-09-12 NOTE — Telephone Encounter (Signed)
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

## 2011-09-12 NOTE — Telephone Encounter (Signed)
Patient wife called [facility] requesting results of U/S done yesterday

## 2011-09-13 ENCOUNTER — Ambulatory Visit: Payer: Self-pay | Admitting: Internal Medicine

## 2011-09-13 NOTE — Telephone Encounter (Signed)
Pts fiance informed

## 2011-09-18 ENCOUNTER — Institutional Professional Consult (permissible substitution): Payer: Self-pay | Admitting: Pulmonary Disease

## 2011-09-20 ENCOUNTER — Ambulatory Visit (INDEPENDENT_AMBULATORY_CARE_PROVIDER_SITE_OTHER): Payer: Self-pay | Admitting: Internal Medicine

## 2011-09-20 ENCOUNTER — Ambulatory Visit: Payer: Self-pay | Admitting: Internal Medicine

## 2011-09-20 ENCOUNTER — Encounter: Payer: Self-pay | Admitting: Internal Medicine

## 2011-09-20 ENCOUNTER — Other Ambulatory Visit (INDEPENDENT_AMBULATORY_CARE_PROVIDER_SITE_OTHER): Payer: Self-pay

## 2011-09-20 VITALS — BP 122/70 | HR 77 | Temp 98.8°F | Resp 16 | Wt 166.2 lb

## 2011-09-20 DIAGNOSIS — E781 Pure hyperglyceridemia: Secondary | ICD-10-CM

## 2011-09-20 DIAGNOSIS — M609 Myositis, unspecified: Secondary | ICD-10-CM

## 2011-09-20 DIAGNOSIS — IMO0001 Reserved for inherently not codable concepts without codable children: Secondary | ICD-10-CM

## 2011-09-20 DIAGNOSIS — E785 Hyperlipidemia, unspecified: Secondary | ICD-10-CM

## 2011-09-20 DIAGNOSIS — E876 Hypokalemia: Secondary | ICD-10-CM

## 2011-09-20 DIAGNOSIS — I10 Essential (primary) hypertension: Secondary | ICD-10-CM

## 2011-09-20 LAB — LIPID PANEL
HDL: 20 mg/dL — ABNORMAL LOW (ref >39.00)
Total CHOL/HDL Ratio: 30
VLDL: 133.8 mg/dL — ABNORMAL HIGH (ref 0.0–40.0)

## 2011-09-20 LAB — COMPREHENSIVE METABOLIC PANEL
ALT: 250 U/L — ABNORMAL HIGH (ref 0–53)
AST: 215 U/L — ABNORMAL HIGH (ref 0–37)
Alkaline Phosphatase: 150 U/L — ABNORMAL HIGH (ref 39–117)
BUN: 10 mg/dL (ref 6–23)
Creatinine, Ser: 1.2 mg/dL (ref 0.4–1.5)
Potassium: 3.4 mEq/L — ABNORMAL LOW (ref 3.5–5.1)

## 2011-09-20 LAB — CK: Total CK: 109 U/L (ref 7–232)

## 2011-09-20 NOTE — Progress Notes (Signed)
Subjective:    Patient ID: Martin Singh, male    DOB: [birth year], 46 y.o.   MRN: [id number]  Hypertension This is a chronic problem. The current episode started more than 1 year ago. The problem has been gradually improving since onset. The problem is controlled. Pertinent negatives include no anxiety, blurred vision, chest pain, headaches, malaise/fatigue, neck pain, orthopnea, palpitations, peripheral edema, PND, shortness of breath or sweats. There are no associated agents to hypertension. Past treatments include beta blockers and diuretics. The current treatment provides significant improvement. Compliance problems include exercise and diet.       Review of Systems  Constitutional: Negative.  Negative for malaise/fatigue.  HENT: Negative.  Negative for neck pain.   Eyes: Negative.  Negative for blurred vision.  Respiratory: Negative for cough, chest tightness, shortness of breath, wheezing and stridor.   Cardiovascular: Negative for chest pain, palpitations, orthopnea, leg swelling and PND.  Gastrointestinal: Negative for nausea, vomiting, abdominal pain, diarrhea, constipation and abdominal distention.  Musculoskeletal: Positive for myalgias and arthralgias. Negative for back pain, joint swelling and gait problem.  Skin: Negative for color change, pallor, rash and wound.  Neurological: Negative for dizziness, tremors, seizures, syncope, facial asymmetry, speech difficulty, weakness, light-headedness, numbness and headaches.  Hematological: Negative for adenopathy. Does not bruise/bleed easily.  Psychiatric/Behavioral: Negative.        Objective:   Physical Exam  Vitals reviewed. Constitutional: He is oriented to person, place, and time. He appears well-developed and well-nourished. No distress.  HENT:  Head: Normocephalic and atraumatic.  Mouth/Throat: Oropharynx is clear and moist. No oropharyngeal exudate.  Eyes: Conjunctivae are normal. Right eye exhibits no discharge. Left  eye exhibits no discharge. No scleral icterus.  Neck: Normal range of motion. Neck supple. No JVD present. No tracheal deviation present. No thyromegaly present.  Cardiovascular: Normal rate, regular rhythm, normal heart sounds and intact distal pulses.  Exam reveals no gallop and no friction rub.   No murmur heard. Pulmonary/Chest: Effort normal and breath sounds normal. No stridor. No respiratory distress. He has no wheezes. He has no rales. He exhibits no tenderness.  Abdominal: Soft. Bowel sounds are normal. He exhibits no distension and no mass. There is no tenderness. There is no rebound and no guarding.  Musculoskeletal: Normal range of motion. He exhibits no edema and no tenderness.  Lymphadenopathy:    He has no cervical adenopathy.  Neurological: He is oriented to person, place, and time.  Skin: Skin is warm and dry. No rash noted. He is not diaphoretic. No erythema. No pallor.  Psychiatric: He has a normal mood and affect. His behavior is normal. Judgment and thought content normal.      Lab Results  Component Value Date   WBC 7.4 09/05/2011   HGB 11.9* 09/05/2011   HCT 35.3* 09/05/2011   PLT 261 09/05/2011   GLUCOSE 100* 09/05/2011   CHOL  Value: 141        ATP III CLASSIFICATION:  <200     mg/dL   Desirable  829-562  mg/dL   Borderline High  >=130    mg/dL   High        8/65/7846   TRIG 158* 05/22/2010   HDL 59 05/09/2010   LDLCALC  Value: 34        Total Cholesterol/HDL:CHD Risk Coronary Heart Disease Risk Table                     Men   Women  1/2 Average  Risk   3.4   3.3  Average Risk       5.0   4.4  2 X Average Risk   9.6   7.1  3 X Average Risk  23.4   11.0        Use the calculated Patient Ratio above and the CHD Risk Table to determine the patient's CHD Risk.        ATP III CLASSIFICATION (LDL):  <100     mg/dL   Optimal  540-981  mg/dL   Near or Above                    Optimal  130-159  mg/dL   Borderline  191-478  mg/dL   High  >295     mg/dL   Very High 02/14/3085   ALT 353*  09/03/2011   AST 414* 09/03/2011   NA 144 09/05/2011   K 4.1 09/05/2011   CL 108 09/05/2011   CREATININE 1.38* 09/05/2011   BUN 41* 09/05/2011   CO2 27 09/05/2011   TSH 1.28 09/03/2011   INR 1.05 05/07/2010      Assessment & Plan:

## 2011-09-20 NOTE — Assessment & Plan Note (Signed)
I will check his CK and CRP today as well as his lytes to try to find the cause of his muscel and joint pain

## 2011-09-20 NOTE — Patient Instructions (Signed)

## 2011-09-20 NOTE — Assessment & Plan Note (Signed)
His BP is well controlled, I will check his lytes and renal function today 

## 2011-09-20 NOTE — Assessment & Plan Note (Signed)
I will check his FLP today 

## 2011-09-21 ENCOUNTER — Encounter: Payer: Self-pay | Admitting: Internal Medicine

## 2011-09-21 DIAGNOSIS — E781 Pure hyperglyceridemia: Secondary | ICD-10-CM

## 2011-09-21 DIAGNOSIS — E876 Hypokalemia: Secondary | ICD-10-CM | POA: Insufficient documentation

## 2011-09-21 HISTORY — DX: Pure hyperglyceridemia: E78.1

## 2011-09-21 LAB — LDL CHOLESTEROL, DIRECT: Direct LDL: 484 mg/dL

## 2011-09-21 LAB — C-REACTIVE PROTEIN: CRP: 0.39 mg/dL (ref <0.60)

## 2011-09-21 MED ORDER — POTASSIUM CHLORIDE CRYS ER 20 MEQ PO TBCR: 20.0000 meq | EXTENDED_RELEASE_TABLET | Freq: Two times a day (BID) | ORAL | Status: DC

## 2011-09-21 MED ORDER — OMEGA-3-ACID ETHYL ESTERS 1 G PO CAPS: 2.0000 g | ORAL_CAPSULE | Freq: Two times a day (BID) | ORAL | Status: DC

## 2011-09-21 NOTE — Progress Notes (Signed)
Addended by: Etta Grandchild on: 09/21/2011 09:59 AM   Modules accepted: Orders

## 2011-09-26 ENCOUNTER — Encounter: Payer: Self-pay | Admitting: Pulmonary Disease

## 2011-09-26 ENCOUNTER — Ambulatory Visit (INDEPENDENT_AMBULATORY_CARE_PROVIDER_SITE_OTHER): Payer: Self-pay | Admitting: Pulmonary Disease

## 2011-09-26 VITALS — BP 122/76 | HR 90 | Temp 98.9°F | Ht 62.0 in | Wt 171.4 lb

## 2011-09-26 DIAGNOSIS — R05 Cough: Secondary | ICD-10-CM

## 2011-09-26 DIAGNOSIS — R053 Chronic cough: Secondary | ICD-10-CM

## 2011-09-26 MED ORDER — OMEPRAZOLE 40 MG PO CPDR: DELAYED_RELEASE_CAPSULE | ORAL | Status: DC

## 2011-09-26 NOTE — Patient Instructions (Signed)
Stop your current over the counter congestion medication and take chlorpheniramine 8mg  each night at bedtime for next 2 weeks. Will increase your omeprazole to 40mg  in am and pm.  Will give you a new prescription for this. Continue with limiting voice use, hard candy, no throat clearing. If your cough worsens please call us.  Call in 2 weeks to give [place] update on how things are going.

## 2011-09-26 NOTE — Assessment & Plan Note (Signed)
The patient states that his cough is 50% better after doing the cyclical cough protocol and behavioral therapies.  He is starting to have a little bit of an increase in his cough most recently, and I would like to continue with his behavioral therapies and aggressive treatment of postnasal drip and reflux.  If he continues to have call for worsening symptoms, would image his sinuses, and also do pulmonary function studies to evaluate for cough variant asthma.

## 2011-09-26 NOTE — Progress Notes (Signed)
  Subjective:    Patient ID: Martin Singh, male    DOB: [birth year], 46 y.o.   MRN: [id number]  HPI The patient comes in today for followup of his chronic cough, felt to be related to upper airway issues and cyclical coughing.  Since his last visit, he believes his cough is at least 50% better, but is concerned that he may be starting up again.  He currently is not taking chlorpheniramine at bedtime, and never increased his omeprazole to 40 mg b.i.d.  He is bringing up very little weakness, but is starting to have more sinus symptoms.   Review of Systems  Constitutional: Positive for chills and diaphoresis. Negative for fever and unexpected weight change.  HENT: Positive for rhinorrhea. Negative for ear pain, nosebleeds, congestion, sore throat, sneezing, trouble swallowing, dental problem, postnasal drip and sinus pressure.   Eyes: Negative for redness and itching.  Respiratory: Positive for cough, chest tightness, shortness of breath and wheezing.   Cardiovascular: Positive for leg swelling. Negative for palpitations.  Gastrointestinal: Negative for nausea and vomiting.  Genitourinary: Negative for dysuria.  Musculoskeletal: Positive for joint swelling.  Skin: Negative for rash.  Neurological: Positive for headaches.  Hematological: Does not bruise/bleed easily.  Psychiatric/Behavioral: Negative for dysphoric mood. The patient is nervous/anxious.        Objective:   Physical Exam Overweight male in no acute distress Nose without purulence or discharge noted Chest totally clear to auscultation Cardiac exam with regular rate and rhythm Lower extremities without edema, no cyanosis Alert and oriented, moves all 4 extremities.       Assessment & Plan:

## 2011-09-28 ENCOUNTER — Telehealth: Payer: Self-pay | Admitting: Pulmonary Disease

## 2011-09-28 NOTE — Telephone Encounter (Signed)
I spoke with spouse and she states pt c/o dry hoarse cough, sore throat, nasal congestion, nausea (but no vomiting), dizziness, lots of chest congestion, headache, wheezing, and is sleeping a lot x wed night. Denies any fever, chills, sweats. She states when pt was here on wed he was feeling fine. Pt is taking chlorpheniramine 8 mg BID, limiting his voice use, drinking lots of fluids, using hard candy to bathe back of the throat. Spouse is requesting recs from Dr. Shelle Iron. Please advise thanks  Allergies  Allergen Reactions  . Codeine Anaphylaxis  . Vicodin (Hydrocodone-Acetaminophen) Anaphylaxis  . Benazepril Cough  . Ativan Other (See Comments)    Becomes violent and hallucinates  . Corticosteroids   . Prednisone

## 2011-09-28 NOTE — Telephone Encounter (Signed)
Spoke with pt Martin Singh and notified of recs per [facility]. She verbalized understanding and states nothing further needed.

## 2011-09-28 NOTE — Telephone Encounter (Signed)
Sounds like he is having a viral upper respiratory infection.  Would treat this with symptoms relief with over the counter preparations.  Can take tylenol cold and sinus.  If he does this, needs to hold chlorpheniramine , but can restart after he stops tylenol cold and sinus.

## 2011-10-02 ENCOUNTER — Ambulatory Visit (INDEPENDENT_AMBULATORY_CARE_PROVIDER_SITE_OTHER): Payer: Self-pay | Admitting: Adult Health

## 2011-10-02 ENCOUNTER — Encounter: Payer: Self-pay | Admitting: Adult Health

## 2011-10-02 ENCOUNTER — Telehealth: Payer: Self-pay | Admitting: *Deleted

## 2011-10-02 VITALS — BP 122/80 | HR 110 | Temp 97.7°F | Ht 62.0 in | Wt 167.0 lb

## 2011-10-02 DIAGNOSIS — R05 Cough: Secondary | ICD-10-CM

## 2011-10-02 MED ORDER — PROMETHAZINE-DM 6.25-15 MG/5ML PO SYRP: 5.0000 mL | ORAL_SOLUTION | Freq: Four times a day (QID) | ORAL | Status: DC | PRN

## 2011-10-02 MED ORDER — BENZONATATE 100 MG PO CAPS: 100.0000 mg | ORAL_CAPSULE | Freq: Three times a day (TID) | ORAL | Status: AC | PRN

## 2011-10-02 NOTE — Patient Instructions (Signed)
We are setting you up for Ct sinus  Take Benzonatate 100mg  Three times a day  For cough  Take Promethazine DM 1 tsp Four times a day  For cough  Hold Fish oil.  Use sugarless candy , no mints, to help avoid cough/throat clearing.  Use sips of water Continue on Chlorphenarimine 4mg  2 At bedtime   Patanase 2 puffs At bedtime  Until sample is gone.  Continue on Omeprazole 40mg  Twice daily  Before meal  GERD Diet  follow up Dr. Shelle Iron in 2 weeks and As needed   Please contact office for sooner follow up if symptoms do not improve or worsen or seek emergency care

## 2011-10-02 NOTE — Telephone Encounter (Signed)
Pt went to see Dr Shelle Iron today.  He is having back pain, tramadol not working.  He has taken advil, tylenol, and aleve with no relief.  Wanted to know if Dr Yetta Barre would give him some Percocet or something stronger than Tramadol.  Pt is allergic to codeine and hydrocodone.  [facility].  Wants to stick to the $4 list of [facility] cause they don't have insurance

## 2011-10-02 NOTE — Progress Notes (Signed)
  Subjective:    Patient ID: Martin Singh, male    DOB: [birth year], 46 y.o.   MRN: [id number]  HPI 46 yo WM former smoker followed chronic cough, felt to be related to upper airway issues and cyclical coughing.     10/02/2011 Acute OV  Complains of hoarseness, tightness in chest, prod cough with large amounts of white and yellow mucus, increased SOB, low grade temp x4days Over last 2 months has had recurrent cough unresponsive to therapy. He has been seen in the office several times by PCP and Dr. Shelle Iron in pulmonary along with ER visit on 12/30 with multiple prescriptions for for abx, steroids along with GERD and Rhinitis prevention meds. Placed on cough suppression regimen 3 weeks ago . He was taken of his ACE inhibitor on 12/12 . He says antibiotics and steroids help for short period of time but cough never goes away. Cough meds help some but wear off quickly. Over last 4 days cough is back and is worse. Has a lot of sinus drainage. He has had a CXR 07/2011 w/ no acute process. VQ scan w/ low probability for PE.    Review of Systems Constitutional:   No  weight loss, night sweats,  Fevers, chills, fatigue, or  lassitude.  HEENT:   No headaches,  Difficulty swallowing,  Tooth/dental problems, or  Sore throat,                No sneezing, itching, ear ache, + nasal congestion, post nasal drip,   CV:  No chest pain,  Orthopnea, PND, swelling in lower extremities, anasarca, dizziness, palpitations, syncope.   GI  No heartburn, indigestion, abdominal pain, nausea, vomiting, diarrhea, change in bowel habits, loss of appetite, bloody stools.   Resp:   No coughing up of blood.     No chest wall deformity  Skin: no rash or lesions.  GU: no dysuria, change in color of urine, no urgency or frequency.  No flank pain, no hematuria   MS:  No joint pain or swelling.  No decreased range of motion.  No back pain.  Psych:  No change in mood or affect. No depression or anxiety.  No memory  loss.          Objective:   Physical Exam GEN: A/Ox3; pleasant , NAD, well nourished   HEENT:  [place]/AT,  EACs-clear, TMs-wnl, NOSE-clear drainage, THROAT-clear, no lesions, no postnasal drip or exudate noted.   NECK:  Supple w/ fair ROM; no JVD; normal carotid impulses w/o bruits; no thyromegaly or nodules palpated; no lymphadenopathy.  RESP  Coarse BS w/ upper airway exp wheeze on forced exp , barky cough no accessory muscle use, no dullness to percussion  CARD:  RRR, no m/r/g  , no peripheral edema, pulses intact, no cyanosis or clubbing.  GI:   Soft & nt; nml bowel sounds; no organomegaly or masses detected.  Musco: Warm bil, no deformities or joint swelling noted.   Neuro: alert, no focal deficits noted.    Skin: Warm, no lesions or rashes         Assessment & Plan:

## 2011-10-03 NOTE — Telephone Encounter (Signed)
Left message on machine for pt to return my call  

## 2011-10-03 NOTE — Telephone Encounter (Signed)
Pt advised directly of MD advisement

## 2011-10-03 NOTE — Telephone Encounter (Signed)
No, I do not think percocet is a good idea

## 2011-10-03 NOTE — Telephone Encounter (Signed)
The last time I prescribed percocet he passed out and ended up in the ER with a high blood alcohol level, also his low back mri does not show a condition that requires the use of a pain med like percocet

## 2011-10-03 NOTE — Telephone Encounter (Signed)
Pt's fiance called back, upset that she had not been contacted with MD's response. Once she was informed that MD declined request, she wanted to know why, saying they were told at OV that MD would prescribe this medicine, please advise.

## 2011-10-05 ENCOUNTER — Encounter: Payer: Self-pay | Admitting: Adult Health

## 2011-10-05 ENCOUNTER — Ambulatory Visit (INDEPENDENT_AMBULATORY_CARE_PROVIDER_SITE_OTHER)
Admission: RE | Admit: 2011-10-05 | Discharge: 2011-10-05 | Disposition: A | Discharge status: Home / Self Care | Payer: Self-pay | Source: Ambulatory Visit | Attending: Adult Health | Admitting: Adult Health

## 2011-10-05 DIAGNOSIS — R05 Cough: Secondary | ICD-10-CM

## 2011-10-05 NOTE — Assessment & Plan Note (Signed)
Cyclical cough -unresponsive to therapy Check ct sinus  Cont aggressive cough and rhinitis prevention along GERD diet and rx   Plan: We are setting you up for Ct sinus  Take Benzonatate 100mg  Three times a day  For cough  Take Promethazine DM 1 tsp Four times a day  For cough  Hold Fish oil.  Use sugarless candy , no mints, to help avoid cough/throat clearing.  Use sips of water Continue on Chlorphenarimine 4mg  2 At bedtime   Patanase 2 puffs At bedtime  Until sample is gone.  Continue on Omeprazole 40mg  Twice daily  Before meal  GERD Diet  follow up Dr. Shelle Iron in 2 weeks and As needed   Please contact office for sooner follow up if symptoms do not improve or worsen or seek emergency care

## 2011-10-08 ENCOUNTER — Telehealth: Payer: Self-pay | Admitting: Pulmonary Disease

## 2011-10-08 ENCOUNTER — Encounter (HOSPITAL_COMMUNITY): Payer: Self-pay | Admitting: *Deleted

## 2011-10-08 ENCOUNTER — Emergency Department (HOSPITAL_COMMUNITY)
Admission: EM | Admit: 2011-10-08 | Discharge: 2011-10-09 | Disposition: A | Discharge status: Home / Self Care | Payer: Self-pay | Attending: Emergency Medicine | Admitting: Emergency Medicine

## 2011-10-08 DIAGNOSIS — IMO0001 Reserved for inherently not codable concepts without codable children: Secondary | ICD-10-CM | POA: Insufficient documentation

## 2011-10-08 DIAGNOSIS — R109 Unspecified abdominal pain: Secondary | ICD-10-CM | POA: Insufficient documentation

## 2011-10-08 DIAGNOSIS — M2550 Pain in unspecified joint: Secondary | ICD-10-CM | POA: Insufficient documentation

## 2011-10-08 DIAGNOSIS — R141 Gas pain: Secondary | ICD-10-CM | POA: Insufficient documentation

## 2011-10-08 DIAGNOSIS — R05 Cough: Secondary | ICD-10-CM | POA: Insufficient documentation

## 2011-10-08 DIAGNOSIS — R142 Eructation: Secondary | ICD-10-CM | POA: Insufficient documentation

## 2011-10-08 DIAGNOSIS — R071 Chest pain on breathing: Secondary | ICD-10-CM | POA: Insufficient documentation

## 2011-10-08 DIAGNOSIS — K219 Gastro-esophageal reflux disease without esophagitis: Secondary | ICD-10-CM | POA: Insufficient documentation

## 2011-10-08 DIAGNOSIS — I10 Essential (primary) hypertension: Secondary | ICD-10-CM | POA: Insufficient documentation

## 2011-10-08 DIAGNOSIS — K760 Fatty (change of) liver, not elsewhere classified: Secondary | ICD-10-CM

## 2011-10-08 DIAGNOSIS — Z7982 Long term (current) use of aspirin: Secondary | ICD-10-CM | POA: Insufficient documentation

## 2011-10-08 DIAGNOSIS — Z79899 Other long term (current) drug therapy: Secondary | ICD-10-CM | POA: Insufficient documentation

## 2011-10-08 DIAGNOSIS — F411 Generalized anxiety disorder: Secondary | ICD-10-CM | POA: Insufficient documentation

## 2011-10-08 DIAGNOSIS — K859 Acute pancreatitis without necrosis or infection, unspecified: Secondary | ICD-10-CM | POA: Insufficient documentation

## 2011-10-08 DIAGNOSIS — R5381 Other malaise: Secondary | ICD-10-CM | POA: Insufficient documentation

## 2011-10-08 DIAGNOSIS — F3289 Other specified depressive episodes: Secondary | ICD-10-CM | POA: Insufficient documentation

## 2011-10-08 DIAGNOSIS — J3489 Other specified disorders of nose and nasal sinuses: Secondary | ICD-10-CM | POA: Insufficient documentation

## 2011-10-08 DIAGNOSIS — M1990 Unspecified osteoarthritis, unspecified site: Secondary | ICD-10-CM | POA: Insufficient documentation

## 2011-10-08 DIAGNOSIS — F329 Major depressive disorder, single episode, unspecified: Secondary | ICD-10-CM | POA: Insufficient documentation

## 2011-10-08 DIAGNOSIS — R000 Tachycardia, unspecified: Secondary | ICD-10-CM | POA: Insufficient documentation

## 2011-10-08 DIAGNOSIS — R059 Cough, unspecified: Secondary | ICD-10-CM | POA: Insufficient documentation

## 2011-10-08 DIAGNOSIS — R509 Fever, unspecified: Secondary | ICD-10-CM | POA: Insufficient documentation

## 2011-10-08 HISTORY — DX: Fatty (change of) liver, not elsewhere classified: K76.0

## 2011-10-08 NOTE — Progress Notes (Signed)
Acute ov reviewed, and agree with plan as outlined.  

## 2011-10-08 NOTE — ED Notes (Signed)
C/o cough, fever, CP, epigastric pain, abd pain & bloating. Sx ongoing for 2-3 months, has been seen for the same. Worsening/re-occurrence last night. Describes cough as productive (yellowish). Has been on prednisone & abx also menions lots of imaging & diagnostic studies.

## 2011-10-08 NOTE — Telephone Encounter (Signed)
Notes Recorded by Rubye Oaks, NP on 10/05/2011 at 5:48 PM No significant infection noted.  Cont w/ ov recs  follow up as planned and As needed  Please contact office for sooner follow up if symptoms do not improve or worsen or seek emergency care   I spoke with spouse and is aware of the results. She voiced her understanding and had no questions

## 2011-10-09 ENCOUNTER — Emergency Department (HOSPITAL_COMMUNITY): Payer: Self-pay

## 2011-10-09 ENCOUNTER — Other Ambulatory Visit: Payer: Self-pay

## 2011-10-09 LAB — URINALYSIS, ROUTINE W REFLEX MICROSCOPIC
Glucose, UA: NEGATIVE mg/dL
Hgb urine dipstick: NEGATIVE
Protein, ur: NEGATIVE mg/dL
Specific Gravity, Urine: 1.014 (ref 1.005–1.030)

## 2011-10-09 LAB — COMPREHENSIVE METABOLIC PANEL
ALT: 177 U/L — ABNORMAL HIGH (ref 0–53)
AST: 192 U/L — ABNORMAL HIGH (ref 0–37)
CO2: 24 mEq/L (ref 19–32)
Chloride: 94 mEq/L — ABNORMAL LOW (ref 96–112)
GFR calc non Af Amer: 81 mL/min — ABNORMAL LOW (ref >90)
Sodium: 133 mEq/L — ABNORMAL LOW (ref 135–145)
Total Bilirubin: 3.2 mg/dL — ABNORMAL HIGH (ref 0.3–1.2)

## 2011-10-09 LAB — DIFFERENTIAL
Basophils Absolute: 0 10*3/uL (ref 0.0–0.1)
Lymphocytes Relative: 5 % — ABNORMAL LOW (ref 12–46)
Neutro Abs: 13.1 10*3/uL — ABNORMAL HIGH (ref 1.7–7.7)
Neutrophils Relative %: 87 % — ABNORMAL HIGH (ref 43–77)

## 2011-10-09 LAB — CBC
Platelets: 320 10*3/uL (ref 150–400)
RDW: 15.4 % (ref 11.5–15.5)
WBC: 15.1 10*3/uL — ABNORMAL HIGH (ref 4.0–10.5)

## 2011-10-09 LAB — CARDIAC PANEL(CRET KIN+CKTOT+MB+TROPI)
CK, MB: 2 ng/mL (ref 0.3–4.0)
Relative Index: 1.1 (ref 0.0–2.5)
Total CK: 183 U/L (ref 7–232)

## 2011-10-09 MED ORDER — IOHEXOL 300 MG/ML  SOLN
100.0000 mL | Freq: Once | INTRAMUSCULAR | Status: AC | PRN
  Administered 2011-10-09: 100 mL via INTRAVENOUS

## 2011-10-09 MED ORDER — SODIUM CHLORIDE 0.9 % IV BOLUS (SEPSIS)
1000.0000 mL | Freq: Once | INTRAVENOUS | Status: AC
  Administered 2011-10-09: 1000 mL via INTRAVENOUS

## 2011-10-09 MED ORDER — IOHEXOL 300 MG/ML  SOLN: 20.0000 mL | INTRAMUSCULAR | Status: AC

## 2011-10-09 MED ORDER — ONDANSETRON HCL 4 MG PO TABS: 4.0000 mg | ORAL_TABLET | Freq: Four times a day (QID) | ORAL | Status: AC

## 2011-10-09 MED ORDER — IBUPROFEN 800 MG PO TABS
800.0000 mg | ORAL_TABLET | Freq: Once | ORAL | Status: AC
  Administered 2011-10-09: 800 mg via ORAL
  Filled 2011-10-09: qty 1

## 2011-10-09 MED ORDER — IBUPROFEN 800 MG PO TABS: 800.0000 mg | ORAL_TABLET | Freq: Three times a day (TID) | ORAL | Status: DC

## 2011-10-09 NOTE — ED Notes (Signed)
Patient transported to CT 

## 2011-10-09 NOTE — ED Notes (Signed)
Pt with ongoing lung problems-cough, congestion.  Has seen pulmonologist and PCP.  Has had multiple CT x-rays.  Tonight, c/o abdominal pain x 2 days, also states fever tonight as well.  Denies n/v/d.  Denies urinary incontinence/frequency.

## 2011-10-09 NOTE — ED Provider Notes (Signed)
History     CSN: [id number]  Arrival date & time 10/08/11  2210   First MD Initiated Contact with Patient 10/09/11 0002      Chief Complaint  Patient presents with  . Fever  . Abdominal Pain  . Cough    (Consider location/radiation/quality/duration/timing/severity/associated sxs/prior treatment) HPI Comments: Patient with multiple chronic complaints. Complains of cough, fever, chest pain with coughing, epigastric pain going on for the past 3 months. He is a cough productive of yellow sputum. He is been treated several times for bronchitis pneumonia. His last antibiotic several months ago. He sees pulmonology Dr. Shelle Iron and is treated for chronic cough.  His anterior chest wall pain with coughing. Denies any shortness of breath, nausea or vomiting.  He also complains of right upper quadrant "gallbladder spasms" but has had negative imaging of his gallbladder.  The history is provided by the patient and the spouse.    Past Medical History  Diagnosis Date  . Hypertension   . Depressive disorder, not elsewhere classified   . Osteoarthrosis, unspecified whether generalized or localized, unspecified site   . Irritable bowel syndrome   . Esophageal reflux   . Colon polyps     hyperplastic/  . Allergic rhinitis   . Pancreatitis   . History of pneumonia   . Insomnia   . Anxiety   . Diverticulitis     Past Surgical History  Procedure Date  . Tibula surgery     right  . Fibula fracture surgery     right    Family History  Problem Relation Age of Onset  . Heart disease Mother   . Gallbladder disease Mother   . Nephrolithiasis Mother   . Colon cancer Neg Hx   . Hypertension Father     moth  . Arthritis Other   . Hyperlipidemia Other   . Stroke Other     History  Substance Use Topics  . Smoking status: Former Smoker -- 1.5 packs/day for 18 years    Types: Cigarettes    Quit date: 08/27/1994  . Smokeless tobacco: Current User    Types: Snuff  . Alcohol Use: No       Review of Systems  Constitutional: Positive for fever, activity change and appetite change.  HENT: Positive for congestion, sore throat and rhinorrhea. Negative for neck pain and neck stiffness.   Respiratory: Positive for cough and shortness of breath.   Cardiovascular: Positive for chest pain.  Gastrointestinal: Positive for abdominal pain and abdominal distention. Negative for nausea and vomiting.  Genitourinary: Negative for dysuria and hematuria.  Musculoskeletal: Positive for myalgias and arthralgias. Negative for back pain.  Neurological: Positive for weakness. Negative for headaches.    Allergies  Codeine; Vicodin; Benazepril; Ativan; Corticosteroids; and Prednisone  Home Medications   Current Outpatient Rx  Name Route Sig Dispense Refill  . ASPIRIN 81 MG PO CHEW Oral Chew 162 mg by mouth daily.    Marland Kitchen BENZONATATE 100 MG PO CAPS Oral Take 1 capsule (100 mg total) by mouth 3 (three) times daily as needed for cough. 42 capsule 0  . DIAZEPAM 5 MG PO TABS Oral Take 5 mg by mouth every 8 (eight) hours as needed. For anxiety    . HYDROCHLOROTHIAZIDE 12.5 MG PO CAPS Oral Take 12.5 mg by mouth daily.     . NEBIVOLOL HCL 5 MG PO TABS Oral Take 5 mg by mouth daily.    Marland Kitchen OMEPRAZOLE 40 MG PO CPDR  Take 1 capsule in the  morning and 1 capsule in the evening 60 capsule 3  . POTASSIUM GLUCONATE 550 MG PO TABS Oral Take 1 tablet by mouth 2 (two) times daily.    Marland Kitchen PROBIOTIC PO Oral Take 1 capsule by mouth daily.     Marland Kitchen PROMETHAZINE HCL 6.25 MG/5ML PO SYRP Oral Take 6.25 mg by mouth 4 (four) times daily as needed. For nausea    . IBUPROFEN 800 MG PO TABS Oral Take 1 tablet (800 mg total) by mouth 3 (three) times daily. 21 tablet 0  . ONDANSETRON HCL 4 MG PO TABS Oral Take 1 tablet (4 mg total) by mouth every 6 (six) hours. 12 tablet 0    BP 115/63  Pulse 91  Temp(Src) 98.1 F (36.7 C) (Oral)  Resp 14  SpO2 97%  Physical Exam  Constitutional: He is oriented to person, place, and  time. He appears well-developed and well-nourished. No distress.       Febrile, tachycardic, no distress  HENT:  Head: Normocephalic and atraumatic.  Mouth/Throat: Oropharynx is clear and moist. No oropharyngeal exudate.  Eyes: Conjunctivae are normal. Pupils are equal, round, and reactive to light.  Neck: Normal range of motion.  Cardiovascular: Normal rate, regular rhythm and normal heart sounds.   Pulmonary/Chest: Effort normal and breath sounds normal. No respiratory distress. He has no wheezes.  Abdominal: Soft. He exhibits distension. There is no tenderness. There is no rebound and no guarding.  Musculoskeletal: Normal range of motion. He exhibits no edema and no tenderness.       No leg pain or swelling  Neurological: He is alert and oriented to person, place, and time. No cranial nerve deficit.  Skin: Skin is warm.    ED Course  Procedures (including critical care time)  Labs Reviewed  URINALYSIS, ROUTINE W REFLEX MICROSCOPIC - Abnormal; Notable for the following:    Color, Urine AMBER (*) BIOCHEMICALS MAY BE AFFECTED BY COLOR   Bilirubin Urine SMALL (*)    All other components within normal limits  CBC - Abnormal; Notable for the following:    WBC 15.1 (*)    RBC 3.72 (*) CORRECTED FOR COLD AGGLUTININS   Hemoglobin 12.1 (*)    HCT 35.7 (*)    All other components within normal limits  DIFFERENTIAL - Abnormal; Notable for the following:    Neutrophils Relative 87 (*)    Neutro Abs 13.1 (*)    Lymphocytes Relative 5 (*)    Monocytes Absolute 1.1 (*)    All other components within normal limits  COMPREHENSIVE METABOLIC PANEL - Abnormal; Notable for the following:    Sodium 133 (*)    Chloride 94 (*)    Glucose, Bld 157 (*)    Albumin 3.4 (*)    AST 192 (*) HEMOLYSIS AT THIS LEVEL MAY AFFECT RESULT   ALT 177 (*) HEMOLYSIS AT THIS LEVEL MAY AFFECT RESULT   Alkaline Phosphatase 166 (*) HEMOLYSIS AT THIS LEVEL MAY AFFECT RESULT   Total Bilirubin 3.2 (*)    GFR calc non  Af Amer 81 (*)    All other components within normal limits  LIPASE, BLOOD - Abnormal; Notable for the following:    Lipase 255 (*)    All other components within normal limits  CARDIAC PANEL(CRET KIN+CKTOT+MB+TROPI)  LACTIC ACID, PLASMA  D-DIMER, QUANTITATIVE   Dg Chest 2 View  10/09/2011  *RADIOLOGY REPORT*  Clinical Data: Fever.  Cough.  Right-sided abdominal pain.  CHEST - 2 VIEW  Comparison: 08/26/2011  Findings: Normal  heart size and pulmonary vascularity.  Lungs appear clear expanded.  No focal consolidation.  No blunting of costophrenic angles.  No pneumothorax.  Vague nodular opacities over the lower lungs consistent with prominent nipple shadows and stable since the previous study.  IMPRESSION: No evidence of active pulmonary disease.  No significant change since prior study.  Original Report Authenticated By: Marlon Pel, M.D.   Ct Angio Chest W/cm &/or Wo Cm  10/09/2011  *RADIOLOGY REPORT*  Clinical Data:  Fever, cough, abdominal pain.  CT ANGIOGRAPHY CHEST, ABDOMEN AND PELVIS  Technique:  Multidetector CT imaging through the chest, abdomen and pelvis was performed using the standard protocol during bolus administration of intravenous contrast.  Multiplanar reconstructed images including MIPs were obtained and reviewed to evaluate the vascular anatomy.  Contrast: OMNIPAQUE IOHEXOL 300 MG/ML IV SOLN  Comparison:   None.  CTA CHEST  Findings:  Normal caliber thoracic aorta without aneurysm or dissection.  Normal opacification of central and segmental pulmonary arteries.  No evidence of pulmonary embolus.  Normal heart size.  No significant lymphadenopathy in the chest.  Gas filled nondistended esophagus without significant wall thickening. No pleural effusions.  Airways appear patent.  Lungs appear clear expanded.  No focal airspace consolidation.  No significant interstitial change.  No pneumothorax.  Degenerative change in the thoracic spine.   Review of the MIP images confirms  the above findings.  IMPRESSION: No evidence of pulmonary embolus, aortic aneurysm, or dissection.  CTA ABDOMEN AND PELVIS  Findings:  Normal caliber abdominal aorta.  Abdominal aorta and branch vessels are patent.  Minimal calcification.  No evidence of dissection.  Diffuse low attenuation change throughout the liver consistent with fatty infiltration.  The spleen and gallbladder are unremarkable. No adrenal gland nodules.  Small diverticulum arising from the posterior upper stomach.  Stomach is decompressed.  There is inflammatory infiltration around the body and tail of the pancreas with small amount of fluid extending along the left anterior pararenal space and along the left pericolic gutter.  This is consistent with acute pancreatitis.  Normal enhancement of pancreatic parenchyma.  No pancreatic ductal dilatation.  No vascular complications.  Small bowel are not dilated.  No free air in the abdomen.  Stool filled colon without distension or wall thickening.  Prominent visceral adipose tissue. Sub centimeter parenchymal cyst in the right kidney.  No solid mass or hydronephrosis in either kidney.  Pelvis:  Mild enlargement of the prostate gland.  Bladder wall is not thickened.  No free or loculated pelvic fluid collections.  No inflammatory changes in the sigmoid colon despite presence of sigmoid diverticula.  The appendix is not visualized but no inflammatory changes are suggested in the right lower quadrant.   Review of the MIP images confirms the above findings.  IMPRESSION: Infiltration and edema around the body and tail the pancreas and left upper quadrant consistent with acute pancreatitis.  No evidence of pancreatic necrosis, abscess, pseudocyst, or vascular complication.  Diffuse fatty infiltration of the liver.  Prominent visceral adipose tissue.  Original Report Authenticated By: Marlon Pel, M.D.   Ct Abdomen Pelvis W Contrast  10/09/2011  *RADIOLOGY REPORT*  Clinical Data:  Fever, cough,  abdominal pain.  CT ANGIOGRAPHY CHEST, ABDOMEN AND PELVIS  Technique:  Multidetector CT imaging through the chest, abdomen and pelvis was performed using the standard protocol during bolus administration of intravenous contrast.  Multiplanar reconstructed images including MIPs were obtained and reviewed to evaluate the vascular anatomy.  Contrast: OMNIPAQUE  IOHEXOL 300 MG/ML IV SOLN  Comparison:   None.  CTA CHEST  Findings:  Normal caliber thoracic aorta without aneurysm or dissection.  Normal opacification of central and segmental pulmonary arteries.  No evidence of pulmonary embolus.  Normal heart size.  No significant lymphadenopathy in the chest.  Gas filled nondistended esophagus without significant wall thickening. No pleural effusions.  Airways appear patent.  Lungs appear clear expanded.  No focal airspace consolidation.  No significant interstitial change.  No pneumothorax.  Degenerative change in the thoracic spine.   Review of the MIP images confirms the above findings.  IMPRESSION: No evidence of pulmonary embolus, aortic aneurysm, or dissection.  CTA ABDOMEN AND PELVIS  Findings:  Normal caliber abdominal aorta.  Abdominal aorta and branch vessels are patent.  Minimal calcification.  No evidence of dissection.  Diffuse low attenuation change throughout the liver consistent with fatty infiltration.  The spleen and gallbladder are unremarkable. No adrenal gland nodules.  Small diverticulum arising from the posterior upper stomach.  Stomach is decompressed.  There is inflammatory infiltration around the body and tail of the pancreas with small amount of fluid extending along the left anterior pararenal space and along the left pericolic gutter.  This is consistent with acute pancreatitis.  Normal enhancement of pancreatic parenchyma.  No pancreatic ductal dilatation.  No vascular complications.  Small bowel are not dilated.  No free air in the abdomen.  Stool filled colon without distension or wall  thickening.  Prominent visceral adipose tissue. Sub centimeter parenchymal cyst in the right kidney.  No solid mass or hydronephrosis in either kidney.  Pelvis:  Mild enlargement of the prostate gland.  Bladder wall is not thickened.  No free or loculated pelvic fluid collections.  No inflammatory changes in the sigmoid colon despite presence of sigmoid diverticula.  The appendix is not visualized but no inflammatory changes are suggested in the right lower quadrant.   Review of the MIP images confirms the above findings.  IMPRESSION: Infiltration and edema around the body and tail the pancreas and left upper quadrant consistent with acute pancreatitis.  No evidence of pancreatic necrosis, abscess, pseudocyst, or vascular complication.  Diffuse fatty infiltration of the liver.  Prominent visceral adipose tissue.  Original Report Authenticated By: Marlon Pel, M.D.     1. Pancreatitis   2. Chronic cough       MDM  3 months of cough, fever, congestion, chest pain with coughing. Multiple visits for same. No distress but is febrile tachycardic.  White count elevated at 15. Febrile tachycardic on arrival. Both these improved with IV fluids and ibuprofen. Chronic transaminitis and hyperbilirubinemia. Hemoglobin and creatinine stable.  No evidence of pulmonary embolism or pneumonia. Nothing to explain patient's chronic cough and congestion and chest pain. Acute pancreatitis with lipase at 255.  Patient does have history of same. No evidence of pseudocyst or fluid collection  Patient's pain is well-controlled. He is tolerating liquids by mouth. He wants to go home. He will follow up with his PCP Dr. Yetta Barre this week as well as his pulmonologist Dr. Shelle Iron.  Return precautions discussed.   Date: 10/09/2011  Rate: 104  Rhythm: sinus tachycardia  QRS Axis: normal  Intervals: normal  ST/T Wave abnormalities: nonspecific ST/T changes  Conduction Disutrbances:none  Narrative Interpretation:  Anterior T-wave flattening  Old EKG Reviewed: changes noted       Glynn Octave, MD 10/09/11 [phone]

## 2011-10-09 NOTE — ED Notes (Signed)
Pt given fluids to drink for CT Scan with verbal understanding to inform staff when liquid is all consumed. Family at bedside, plan of care is updated with verbal understanding, call light at bedside. Pt is awaiting CT Scan and results for disposition.

## 2011-10-09 NOTE — ED Notes (Signed)
Rx x 2, pt and family voiced understanding to f/u with MD in 2 days.  No other questions at this time.

## 2011-10-09 NOTE — Discharge Instructions (Signed)
Acute Pancreatitis The pancreas is a large gland located behind your stomach. It produces (secretes) enzymes. These enzymes help digest food. It also releases the hormones glucagon and insulin. These hormones help regulate blood sugar. When the pancreas becomes inflamed, the disease is called pancreatitis. Inflammation of the pancreas occurs when enzymes from the pancreas begin attacking and digesting the pancreas. CAUSES  Most cases ofsudden onset (acute) pancreatitis are caused by:  Alcohol abuse.   Gallstones.  Other less common causes are:  Some medications.   Exposure to certain chemicals   Infection.   Damage caused by an accident (trauma).   Surgery of the belly (abdomen).  SYMPTOMS  Acute pancreatitis usually begins with pain in the upper abdomen and may radiate to the back. This pain may last a couple days. The constant pain varies from mild to severe. The acute form of this disease may vary from mild, nonspecific abdominal pain to profound shock with coma. About 1 in 5 cases are severe. These patients become dehydrated and develop low blood pressure. In severe cases, bleeding into the pancreas can lead to shock and death. The lungs, heart, and kidneys may fail. DIAGNOSIS  Your caregiver will form a clinical opinion after giving you an exam. Laboratory work is used to confirm this diagnosis. Often,a digestive enzyme from the pancreas (serum amylase) and other enzymes are elevated. Sugars and fats (lipids) in the blood may be elevated. There may also be changes in the following levels: calcium, magnesium, potassium, chloride and bicarbonate (chemicals in the blood). X-rays, a CT scan, or ultrasound of your abdomen may be necessary to search for other causes of your abdominal pain. TREATMENT  Most pancreatitis requires treatment of symptoms. Most acute attacks last a couple of days. Your caregiver can discuss the treatment options with you.  If complications occur, hospitalization  may be necessary for pain control and intravenous (IV) fluid replacement.   Sometimes, a tube may be put into the stomach to control vomiting.   Food may not be allowed for 3 to 4 days. This gives the pancreas time to rest. Giving the pancreas a rest means there is no stimulation that would produce more enzymes and cause more damage.   Medicines (antibiotics) that kill germs may be given if infection is the cause.   Sometimes, surgery may be required.   Following an acute attack, your caregiver will determine the cause, if possible, and offer suggestions to prevent recurrences.  HOME CARE INSTRUCTIONS   Eat smaller, more frequent meals. This reduces the amount of digestive juices the pancreas produces.   Decrease the amount of fat in your diet. This may help reduce loose, diarrheal stools.   Drink enough water and fluids to keep your urine clear or pale yellow. This is to avoid dehydration which can cause increased pain.   Talk to your caregiver about pain relievers or other medicines that may help.   Avoid anything that may have triggered your pancreatitis (for example, alcohol).   Follow the diet advised by your caregiver. Do not advance the diet too soon.   Take medicines as prescribed.   Get plenty of rest.   Check your blood sugar at home as directed by your caregiver.   If your caregiver has given you a follow-up appointment, it is very important to keep that appointment. Not keeping the appointment could result in a lasting (chronic) or permanent injury, pain, and disability. If there is any problem keeping the appointment, you must call to reschedule.    SEEK MEDICAL CARE IF:   You are not recovering in the time described by your caregiver.   You have persistent pain, weakness, or feel sick to your stomach (nauseous).   You have recovered and then have another bout of pain.  SEEK IMMEDIATE MEDICAL CARE IF:   You are unable to eat or keep fluids down.   Your pain  increases a lot or changes.   You have an oral temperature above 102 F (38.9 C), not controlled by medicine.   Your skin or the white part of your eyes look yellow (jaundice).   You develop vomiting.   You feel dizzy or faint.   Your blood sugar is high (over 300).  MAKE SURE YOU:   Understand these instructions.   Will watch your condition.   Will get help right away if you are not doing well or get worse.  Document Released: 08/13/2005 Document Revised: 04/25/2011 Document Reviewed: 03/26/2008 [employer] Patient Information 2012 [employer]. 

## 2011-10-17 ENCOUNTER — Ambulatory Visit: Payer: Self-pay | Admitting: Pulmonary Disease

## 2011-10-17 ENCOUNTER — Other Ambulatory Visit: Payer: Self-pay | Admitting: *Deleted

## 2011-10-17 NOTE — Telephone Encounter (Signed)
Pt's fiance called requesting refill of Ibuprofen 800mg  1 tid for arthritis pain. Pt was prescribed rx in the ER and wants regular refill-please advise in [facility]J's absence if okay to refill-thanks

## 2011-10-18 MED ORDER — IBUPROFEN 800 MG PO TABS: 800.0000 mg | ORAL_TABLET | Freq: Three times a day (TID) | ORAL | Status: AC

## 2011-10-18 NOTE — Telephone Encounter (Signed)
OK #[id number] -- f/u w/Dr Geralyn Flash

## 2011-10-18 NOTE — Telephone Encounter (Signed)
Pt's fiancee' informed.

## 2011-10-22 ENCOUNTER — Telehealth: Payer: Self-pay | Admitting: Internal Medicine

## 2011-10-22 NOTE — Telephone Encounter (Signed)
The pt called and is hoping a refill of the promethazine 6.25mg /69ml syrup.    Thanks!

## 2011-10-22 NOTE — Telephone Encounter (Signed)
no

## 2011-10-23 ENCOUNTER — Encounter (HOSPITAL_COMMUNITY): Payer: Self-pay

## 2011-10-23 ENCOUNTER — Emergency Department (HOSPITAL_COMMUNITY)
Admission: EM | Admit: 2011-10-23 | Discharge: 2011-10-24 | Disposition: A | Discharge status: Home / Self Care | Payer: Self-pay | Attending: Emergency Medicine | Admitting: Emergency Medicine

## 2011-10-23 DIAGNOSIS — K922 Gastrointestinal hemorrhage, unspecified: Secondary | ICD-10-CM | POA: Insufficient documentation

## 2011-10-23 DIAGNOSIS — F329 Major depressive disorder, single episode, unspecified: Secondary | ICD-10-CM | POA: Insufficient documentation

## 2011-10-23 DIAGNOSIS — F1020 Alcohol dependence, uncomplicated: Secondary | ICD-10-CM | POA: Insufficient documentation

## 2011-10-23 DIAGNOSIS — F3289 Other specified depressive episodes: Secondary | ICD-10-CM | POA: Insufficient documentation

## 2011-10-23 DIAGNOSIS — D649 Anemia, unspecified: Secondary | ICD-10-CM | POA: Insufficient documentation

## 2011-10-23 DIAGNOSIS — F29 Unspecified psychosis not due to a substance or known physiological condition: Secondary | ICD-10-CM | POA: Insufficient documentation

## 2011-10-23 DIAGNOSIS — Z79899 Other long term (current) drug therapy: Secondary | ICD-10-CM | POA: Insufficient documentation

## 2011-10-23 DIAGNOSIS — I10 Essential (primary) hypertension: Secondary | ICD-10-CM | POA: Insufficient documentation

## 2011-10-23 LAB — DIFFERENTIAL
Basophils Absolute: 0.1 K/uL (ref 0.0–0.1)
Basophils Relative: 1 % (ref 0–1)
Eosinophils Absolute: 0.2 K/uL (ref 0.0–0.7)
Eosinophils Relative: 3 % (ref 0–5)
Lymphocytes Relative: 41 % (ref 12–46)
Lymphs Abs: 2 10*3/uL (ref 0.7–4.0)
Monocytes Absolute: 0.3 10*3/uL (ref 0.1–1.0)
Monocytes Relative: 6 % (ref 3–12)
Neutro Abs: 2.5 10*3/uL (ref 1.7–7.7)
Neutrophils Relative %: 49 % (ref 43–77)

## 2011-10-23 LAB — RAPID URINE DRUG SCREEN, HOSP PERFORMED
Amphetamines: NOT DETECTED
Barbiturates: NOT DETECTED
Benzodiazepines: POSITIVE — AB
Cocaine: NOT DETECTED
Opiates: POSITIVE — AB
Tetrahydrocannabinol: NOT DETECTED

## 2011-10-23 LAB — CBC
HCT: 31.8 % — ABNORMAL LOW (ref 39.0–52.0)
Hemoglobin: 10.3 g/dL — ABNORMAL LOW (ref 13.0–17.0)
MCH: 33.3 pg (ref 26.0–34.0)
MCHC: 32.4 g/dL (ref 30.0–36.0)
MCV: 102.9 fL — ABNORMAL HIGH (ref 78.0–100.0)
Platelets: 398 K/uL (ref 150–400)
RBC: 3.09 MIL/uL — ABNORMAL LOW (ref 4.22–5.81)
RDW: 15.4 % (ref 11.5–15.5)
WBC: 5 K/uL (ref 4.0–10.5)

## 2011-10-23 LAB — COMPREHENSIVE METABOLIC PANEL WITH GFR
ALT: 39 U/L (ref 0–53)
AST: 52 U/L — ABNORMAL HIGH (ref 0–37)
Alkaline Phosphatase: 79 U/L (ref 39–117)
Calcium: 9.2 mg/dL (ref 8.4–10.5)
Potassium: 4.4 meq/L (ref 3.5–5.1)
Sodium: 145 meq/L (ref 135–145)
Total Protein: 7 g/dL (ref 6.0–8.3)

## 2011-10-23 LAB — COMPREHENSIVE METABOLIC PANEL
Albumin: 3.6 g/dL (ref 3.5–5.2)
BUN: 24 mg/dL — ABNORMAL HIGH (ref 6–23)
CO2: 29 mEq/L (ref 19–32)
Chloride: 107 mEq/L (ref 96–112)
Creatinine, Ser: 1.37 mg/dL — ABNORMAL HIGH (ref 0.50–1.35)
GFR calc Af Amer: 70 mL/min — ABNORMAL LOW (ref >90)
GFR calc non Af Amer: 60 mL/min — ABNORMAL LOW (ref >90)
Glucose, Bld: 96 mg/dL (ref 70–99)
Total Bilirubin: 0.5 mg/dL (ref 0.3–1.2)

## 2011-10-23 LAB — ETHANOL: Alcohol, Ethyl (B): 273 mg/dL — ABNORMAL HIGH (ref 0–11)

## 2011-10-23 MED ORDER — VITAMIN B-1 100 MG PO TABS
100.0000 mg | ORAL_TABLET | Freq: Every day | ORAL | Status: DC
  Administered 2011-10-23: 100 mg via ORAL
  Filled 2011-10-23: qty 1

## 2011-10-23 MED ORDER — THIAMINE HCL 100 MG/ML IJ SOLN: 100.0000 mg | Freq: Every day | INTRAMUSCULAR | Status: DC

## 2011-10-23 MED ORDER — SODIUM CHLORIDE 0.9 % IV BOLUS (SEPSIS): 1000.0000 mL | Freq: Once | INTRAVENOUS | Status: DC

## 2011-10-23 MED ORDER — DIAZEPAM 5 MG PO TABS
5.0000 mg | ORAL_TABLET | Freq: Four times a day (QID) | ORAL | Status: DC | PRN
  Administered 2011-10-23: 5 mg via ORAL
  Filled 2011-10-23: qty 1

## 2011-10-23 MED ORDER — FOLIC ACID 1 MG PO TABS
1.0000 mg | ORAL_TABLET | Freq: Every day | ORAL | Status: DC
  Administered 2011-10-23: 1 mg via ORAL
  Filled 2011-10-23: qty 1

## 2011-10-23 MED ORDER — ADULT MULTIVITAMIN W/MINERALS CH
1.0000 | ORAL_TABLET | Freq: Every day | ORAL | Status: DC
  Administered 2011-10-23 – 2011-10-24 (×2): 1 via ORAL
  Filled 2011-10-23 (×2): qty 1

## 2011-10-23 NOTE — BH Assessment (Signed)
Assessment Note   Martin Singh is a 46 y.o. male who presents to [facility] for detox.  Pt denies SI/HI/Psych.  Pt reports drinking 1 Pint daily for 2 yrs, says has been going through a divorce and mom has cancer; drinking increased due to stressors.  Pt has had previous detox but unable to verify facility or dates.  Pt does take valium for severe anxiety, denies any other drug use.    Axis I: Alcohol Dep 303.90 Axis II: Deferred Axis III:  Past Medical History  Diagnosis Date  . Hypertension   . Depressive disorder, not elsewhere classified   . Osteoarthrosis, unspecified whether generalized or localized, unspecified site   . Irritable bowel syndrome   . Esophageal reflux   . Colon polyps     hyperplastic/  . Allergic rhinitis   . Pancreatitis   . History of pneumonia   . Insomnia   . Anxiety   . Diverticulitis    Axis IV: other psychosocial or environmental problems, problems related to social environment and problems with primary support group Axis V: 51-60 moderate symptoms  Past Medical History:  Past Medical History  Diagnosis Date  . Hypertension   . Depressive disorder, not elsewhere classified   . Osteoarthrosis, unspecified whether generalized or localized, unspecified site   . Irritable bowel syndrome   . Esophageal reflux   . Colon polyps     hyperplastic/  . Allergic rhinitis   . Pancreatitis   . History of pneumonia   . Insomnia   . Anxiety   . Diverticulitis     Past Surgical History  Procedure Date  . Tibula surgery     right  . Fibula fracture surgery     right    Family History:  Family History  Problem Relation Age of Onset  . Heart disease Mother   . Gallbladder disease Mother   . Nephrolithiasis Mother   . Colon cancer Neg Hx   . Hypertension Father     moth  . Arthritis Other   . Hyperlipidemia Other   . Stroke Other     Social History:  reports that he quit smoking about 17 years ago. His smoking use included Cigarettes. He has a 27  pack-year smoking history. His smokeless tobacco use includes Snuff. He reports that he drinks alcohol. He reports that he does not use illicit drugs.  Additional Social History:  Alcohol / Drug Use Pain Medications: None  Prescriptions: None  Over the Counter: None  History of alcohol / drug use?: Yes Longest period of sobriety (when/how long): None  Negative Consequences of Use: Personal relationships Substance #1 Name of Substance 1: Alcohol  1 - Age of First Use: 11 YOM  1 - Amount (size/oz): 1 Pint  1 - Frequency: Daily  1 - Duration: On-going  1 - Last Use / Amount: 10/23/11 Allergies:  Allergies  Allergen Reactions  . Codeine Anaphylaxis  . Vicodin (Hydrocodone-Acetaminophen) Anaphylaxis  . Benazepril Cough  . Ativan Other (See Comments)    Becomes violent and hallucinates  . Corticosteroids Other (See Comments)    Shaky and high blood pressure  . Prednisone Other (See Comments)    High blood pressure and shaky     Home Medications:  Medications Prior to Admission  Medication Dose Route Frequency Provider Last Rate Last Dose  . diazepam (VALIUM) tablet 5 mg  5 mg Oral Q6H PRN Lisette Paz, PA-C   5 mg at 10/23/11 2118  . folic acid (  FOLVITE) tablet 1 mg  1 mg Oral Daily Lisette Paz, PA-C   1 mg at 10/23/11 2118  . mulitivitamin with minerals tablet 1 tablet  1 tablet Oral Daily Lisette Paz, PA-C   1 tablet at 10/23/11 2117  . thiamine (VITAMIN B-1) tablet 100 mg  100 mg Oral Daily Lisette Paz, PA-C   100 mg at 10/23/11 2116   Or  . thiamine (B-1) injection 100 mg  100 mg Intravenous Daily Lisette Paz, PA-C      . DISCONTD: sodium chloride 0.9 % bolus 1,000 mL  1,000 mL Intravenous Once Lisette Paz, PA-C       Medications Prior to Admission  Medication Sig Dispense Refill  . diazepam (VALIUM) 5 MG tablet Take 5 mg by mouth every 8 (eight) hours as needed. For anxiety      . hydrochlorothiazide (,MICROZIDE/HYDRODIURIL,) 12.5 MG capsule Take 12.5 mg by mouth daily.        Marland Kitchen ibuprofen (ADVIL,MOTRIN) 800 MG tablet Take 1 tablet (800 mg total) by mouth 3 (three) times daily.  60 tablet  0  . nebivolol (BYSTOLIC) 5 MG tablet Take 5 mg by mouth daily.      Marland Kitchen omeprazole (PRILOSEC) 40 MG capsule Take 1 capsule in the morning and 1 capsule in the evening  60 capsule  3  . Potassium Gluconate 550 MG TABS Take 1 tablet by mouth daily.       . Probiotic Product (PROBIOTIC PO) Take 1 capsule by mouth daily.       . promethazine (PHENERGAN) 6.25 MG/5ML syrup Take 6.25 mg by mouth 4 (four) times daily as needed. For nausea/cough.        OB/GYN Status:  No LMP for male patient.  General Assessment Data Location of Assessment: [facility] ED Living Arrangements: Alone Can pt return to current living arrangement?: Yes Admission Status: Voluntary Is patient capable of signing voluntary admission?: Yes Transfer from: Acute Hospital Referral Source: MD  Education Status Is patient currently in school?: No Current Grade: None  Highest grade of school patient has completed: Unk  Name of school: [employer]  Contact person: None   Risk to self Suicidal Ideation: No Suicidal Intent: No Is patient at risk for suicide?: No Suicidal Plan?: No Access to Means: No What has been your use of drugs/alcohol within the last 12 months?: Alcohol  Previous Attempts/Gestures: No How many times?: 0  Other Self Harm Risks: None  Triggers for Past Attempts: None known Intentional Self Injurious Behavior: None Family Suicide History: No Recent stressful life event(s): Divorce;Other (Comment) (Mom's health(cancer) ) Persecutory voices/beliefs?: No Depression: Yes Depression Symptoms: Loss of interest in usual pleasures Substance abuse history and/or treatment for substance abuse?: Yes Suicide prevention information given to non-admitted patients: Not applicable  Risk to Others Homicidal Ideation: No Thoughts of Harm to Others: No Current Homicidal Intent: No Current Homicidal Plan:  No Access to Homicidal Means: No Identified Victim: None  History of harm to others?: No Assessment of Violence: None Noted Violent Behavior Description: None  Does patient have access to weapons?: No Criminal Charges Pending?: No Does patient have a court date: No  Psychosis Hallucinations: None noted Delusions: None noted  Mental Status Report Appear/Hygiene: Disheveled Eye Contact: Good Motor Activity: Unremarkable Speech: Logical/coherent;Slow Level of Consciousness: Alert Mood: Sad Affect: Sad Anxiety Level: None Thought Processes: Coherent;Relevant Judgement: Unimpaired Orientation: Person;Place;Time;Situation Obsessive Compulsive Thoughts/Behaviors: None  Cognitive Functioning Concentration: Normal Memory: Recent Intact;Remote Intact IQ: Average Insight: Fair Impulse Control: Fair Appetite:  Good Weight Loss: 0  Weight Gain: 0  Sleep: No Change Total Hours of Sleep: 8  Vegetative Symptoms: None  Prior Inpatient Therapy Prior Inpatient Therapy: Yes Prior Therapy Dates: Unk  Prior Therapy Facilty/Provider(s): Unk  Reason for Treatment: Detox   Prior Outpatient Therapy Prior Outpatient Therapy: No Prior Therapy Dates: None  Prior Therapy Facilty/Provider(s): None  Reason for Treatment: None   ADL Screening (condition at time of admission) Patient's cognitive ability adequate to safely complete daily activities?: Yes Patient able to express need for assistance with ADLs?: Yes Independently performs ADLs?: Yes Weakness of Legs: None Weakness of Arms/Hands: None       Abuse/Neglect Assessment (Assessment to be complete while patient is alone) Physical Abuse: Denies Verbal Abuse: Denies Sexual Abuse: Denies Exploitation of patient/patient's resources: Denies Self-Neglect: Denies Values / Beliefs Cultural Requests During Hospitalization: None Spiritual Requests During Hospitalization: None Consults Spiritual Care Consult Needed: No Social Work  Consult Needed: No Merchant navy officer (For Healthcare) Advance Directive: Patient does not have advance directive;Patient would not like information Pre-existing out of facility DNR order (yellow form or pink MOST form): No    Additional Information 1:1 In Past 12 Months?: No CIRT Risk: No Elopement Risk: No Does patient have medical clearance?: Yes     Disposition:  Disposition Disposition of Patient: Inpatient treatment program;Referred to Type of inpatient treatment program: Adult Patient referred to: [facility]  On Site Evaluation by:   Reviewed with Physician:     Murrell Redden 10/23/2011 10:31 PM

## 2011-10-23 NOTE — ED Notes (Addendum)
Pt arrived on psych ED unit at 1905 via wheelchair because pt was too unsteady on his feet to walk per report of NT that was accompanying him. Pt required assistance to transfer into bed in room, side rail up x1 for safety. VSS, pt somewhat difficult to arouse.

## 2011-10-23 NOTE — ED Notes (Signed)
Pt states wants detox from ETOH, states drinks 3pints a wk, denies SI/HI

## 2011-10-23 NOTE — ED Provider Notes (Signed)
History     CSN: [id number]  Arrival date & time 10/23/11  1707   First MD Initiated Contact with Patient 10/23/11 1724      Chief Complaint  Patient presents with  . Medical Clearance    (Consider location/radiation/quality/duration/timing/severity/associated sxs/prior treatment) HPI Comments: Patient with a history of chronic pancreatitis, pneumonia, depression, and IBS presents emergency department with chief complaint of alcohol detox.  Patient states he drinks at least a pint of brandy a day.  He denies suicidal or homicidal ideations, hallucinations, tremors, seizures, headaches.  Patient states that recently he was evaluated for hematochezia and that he is found to have elevated liver enzymes and poor kidney function.  He wants to detox because his alcohol as it is affecting his health.  Patient has no current new medical complaints but states that he is still having blood in his stools and that his abdomen feels distended.  Patient denies abdominal pain, nausea, vomiting, flank pain, dysuria.  History is also provided by patient's fiance who states that he fell multiple times today while drunk and that he is currently intoxicated.  She states the patient is not a reliable historian.  The history is provided by the patient.    Past Medical History  Diagnosis Date  . Hypertension   . Depressive disorder, not elsewhere classified   . Osteoarthrosis, unspecified whether generalized or localized, unspecified site   . Irritable bowel syndrome   . Esophageal reflux   . Colon polyps     hyperplastic/  . Allergic rhinitis   . Pancreatitis   . History of pneumonia   . Insomnia   . Anxiety   . Diverticulitis     Past Surgical History  Procedure Date  . Tibula surgery     right  . Fibula fracture surgery     right    Family History  Problem Relation Age of Onset  . Heart disease Mother   . Gallbladder disease Mother   . Nephrolithiasis Mother   . Colon cancer Neg Hx     . Hypertension Father     moth  . Arthritis Other   . Hyperlipidemia Other   . Stroke Other     History  Substance Use Topics  . Smoking status: Former Smoker -- 1.5 packs/day for 18 years    Types: Cigarettes    Quit date: 08/27/1994  . Smokeless tobacco: Current User    Types: Snuff  . Alcohol Use: Yes     everyday      Review of Systems  Constitutional: Negative for fever, chills and appetite change.  HENT: Negative for congestion.   Eyes: Negative for visual disturbance.  Respiratory: Negative for shortness of breath.   Cardiovascular: Negative for chest pain and leg swelling.  Gastrointestinal: Negative for abdominal pain.  Genitourinary: Negative for dysuria, urgency and frequency.  Skin: Negative for rash.  Neurological: Negative for dizziness, syncope, weakness, light-headedness, numbness and headaches.  Psychiatric/Behavioral: Positive for confusion and sleep disturbance.       Depression/ Stress  All other systems reviewed and are negative.    Allergies  Codeine; Vicodin; Benazepril; Ativan; Corticosteroids; and Prednisone  Home Medications   Current Outpatient Rx  Name Route Sig Dispense Refill  . ACLIDINIUM BROMIDE 400 MCG/ACT IN AEPB Inhalation Inhale 2 puffs into the lungs 2 (two) times daily as needed. For bronchiospasms.    Marland Kitchen BENZONATATE 100 MG PO CAPS Oral Take 100 mg by mouth 3 (three) times daily as needed. For cough.    Marland Kitchen  DIAZEPAM 5 MG PO TABS Oral Take 5 mg by mouth every 8 (eight) hours as needed. For anxiety    . HYDROCHLOROTHIAZIDE 12.5 MG PO CAPS Oral Take 12.5 mg by mouth daily.     . IBUPROFEN 800 MG PO TABS Oral Take 1 tablet (800 mg total) by mouth 3 (three) times daily. 60 tablet 0  . NEBIVOLOL HCL 5 MG PO TABS Oral Take 5 mg by mouth daily.    . OLOPATADINE HCL 0.6 % NA SOLN Nasal Place 1 puff into the nose at bedtime as needed. For dryness.    . OMEPRAZOLE 40 MG PO CPDR  Take 1 capsule in the morning and 1 capsule in the evening 60  capsule 3  . POTASSIUM GLUCONATE 550 MG PO TABS Oral Take 1 tablet by mouth daily.     Marland Kitchen PROBIOTIC PO Oral Take 1 capsule by mouth daily.     Marland Kitchen PROMETHAZINE HCL 6.25 MG/5ML PO SYRP Oral Take 6.25 mg by mouth 4 (four) times daily as needed. For nausea/cough.      BP 106/65  Pulse 96  Temp 98.7 F (37.1 C)  Resp 20  SpO2 98%  Physical Exam  Nursing note and vitals reviewed. Constitutional: He appears well-developed and well-nourished. No distress.  HENT:  Head: Normocephalic and atraumatic.  Eyes: Conjunctivae and EOM are normal. Pupils are equal, round, and reactive to light.  Neck: Normal range of motion. Neck supple. Normal carotid pulses and no JVD present. Carotid bruit is not present. No rigidity. Normal range of motion present.  Cardiovascular: Normal rate, regular rhythm, S1 normal, S2 normal, normal heart sounds, intact distal pulses and normal pulses.  Exam reveals no gallop and no friction rub.   No murmur heard.      No pitting edema bilaterally, RRR, no aberrant sounds on auscultations, distal pulses intact, no carotid bruit or JVD.   Pulmonary/Chest: Effort normal and breath sounds normal. No accessory muscle usage or stridor. No respiratory distress. He exhibits no tenderness and no bony tenderness.  Abdominal: Bowel sounds are normal.       Distended, no fluid wave, non tender. Non pulsatile aorta.   Skin: Skin is warm, dry and intact. No rash noted. He is not diaphoretic. No cyanosis. Nails show no clubbing.  Psychiatric: His speech is slurred (appears intoxicated). Thought content is not paranoid. Cognition and memory are impaired. He exhibits a depressed mood. He expresses no homicidal ideation. He expresses no suicidal plans and no homicidal plans.    ED Course  Procedures (including critical care time)   Labs Reviewed  ETHANOL  CBC  DIFFERENTIAL  COMPREHENSIVE METABOLIC PANEL  URINE RAPID DRUG SCREEN (HOSP PERFORMED)   No results found.   No diagnosis  found.  Pt unable to produce urine willingly, In and out cath ordred.   MDM   Pts positive occult blood mentioned to Dr. Oletta Lamas who will resume pt care. Labs still pending for medically clearance.           Jaci Carrel, [place] 10/25/11 [phone]

## 2011-10-23 NOTE — ED Notes (Signed)
Charge nurse notified of order for IV fluids.

## 2011-10-24 LAB — CBC
HCT: 26.3 % — ABNORMAL LOW (ref 39.0–52.0)
MCHC: 33.8 g/dL (ref 30.0–36.0)
MCV: 101.2 fL — ABNORMAL HIGH (ref 78.0–100.0)
Platelets: 307 10*3/uL (ref 150–400)
RDW: 15.1 % (ref 11.5–15.5)
WBC: 3.8 10*3/uL — ABNORMAL LOW (ref 4.0–10.5)

## 2011-10-24 LAB — OCCULT BLOOD, POC DEVICE: Fecal Occult Bld: POSITIVE

## 2011-10-24 NOTE — ED Notes (Signed)
Dr Effie Shy made aware of pt labs and orthostatic vs.  Pt Ok to DC.

## 2011-10-24 NOTE — ED Notes (Signed)
Pt states that Martin Singh is allowed to receive information about him.  [phone].  Also states that he does not want inpatient tx. Only outpatient.  Does not want it to interfere with his unemployment benefits.

## 2011-10-24 NOTE — BH Assessment (Signed)
Assessment Note   Martin Singh is an 46 y.o. male. Initially presenting to [facility] for detox on 10/23/11. Pt denies SI/HI/Psych. Pt reports drinking 1 Pint daily for 2 yrs, says has been going through a divorce and mom has cancer; drinking increased due to stressors. Pt has had previous detox but unable to verify facility or dates. Pt does take valium for severe anxiety, denies any other drug use. While pt awaiting placement, pt reported that he wished to have OPT referrals given rather than IPT. Upon further discussion of options with pt, he disclosed that he was fearful that he would not get unemployment while in the hospital in an IPT setting. Discussed with EDP who was agreeable with giving pt OPT referrals.   Axis I: Alcohol Abuse Axis II: Deferred Axis III:  Past Medical History  Diagnosis Date  . Hypertension   . Depressive disorder, not elsewhere classified   . Osteoarthrosis, unspecified whether generalized or localized, unspecified site   . Irritable bowel syndrome   . Esophageal reflux   . Colon polyps     hyperplastic/  . Allergic rhinitis   . Pancreatitis   . History of pneumonia   . Insomnia   . Anxiety   . Diverticulitis    Axis IV: economic problems Axis V: 51-60 moderate symptoms  Past Medical History:  Past Medical History  Diagnosis Date  . Hypertension   . Depressive disorder, not elsewhere classified   . Osteoarthrosis, unspecified whether generalized or localized, unspecified site   . Irritable bowel syndrome   . Esophageal reflux   . Colon polyps     hyperplastic/  . Allergic rhinitis   . Pancreatitis   . History of pneumonia   . Insomnia   . Anxiety   . Diverticulitis     Past Surgical History  Procedure Date  . Tibula surgery     right  . Fibula fracture surgery     right    Family History:  Family History  Problem Relation Age of Onset  . Heart disease Mother   . Gallbladder disease Mother   . Nephrolithiasis Mother   . Colon cancer  Neg Hx   . Hypertension Father     moth  . Arthritis Other   . Hyperlipidemia Other   . Stroke Other     Social History:  reports that he quit smoking about 17 years ago. His smoking use included Cigarettes. He has a 27 pack-year smoking history. His smokeless tobacco use includes Snuff. He reports that he drinks alcohol. He reports that he does not use illicit drugs.  Additional Social History:  Alcohol / Drug Use Pain Medications: None  Prescriptions: None  Over the Counter: None  History of alcohol / drug use?: Yes Longest period of sobriety (when/how long): None  Negative Consequences of Use: Personal relationships Substance #1 Name of Substance 1: Alcohol  1 - Age of First Use: 11 YOM  1 - Amount (size/oz): 1 Pint  1 - Frequency: Daily  1 - Duration: On-going  1 - Last Use / Amount: 10/23/11 Allergies:  Allergies  Allergen Reactions  . Codeine Anaphylaxis  . Vicodin (Hydrocodone-Acetaminophen) Anaphylaxis  . Benazepril Cough  . Ativan Other (See Comments)    Becomes violent and hallucinates  . Corticosteroids Other (See Comments)    Shaky and high blood pressure  . Prednisone Other (See Comments)    High blood pressure and shaky     Home Medications:  Medications Prior to Admission  Medication Dose Route Frequency Provider Last Rate Last Dose  . diazepam (VALIUM) tablet 5 mg  5 mg Oral Q6H PRN Lisette Paz, PA-C   5 mg at 10/23/11 2118  . folic acid (FOLVITE) tablet 1 mg  1 mg Oral Daily Lisette Paz, PA-C   1 mg at 10/23/11 2118  . mulitivitamin with minerals tablet 1 tablet  1 tablet Oral Daily Lisette Paz, PA-C   1 tablet at 10/24/11 0956  . thiamine (VITAMIN B-1) tablet 100 mg  100 mg Oral Daily Lisette Paz, PA-C   100 mg at 10/23/11 2116   Or  . thiamine (B-1) injection 100 mg  100 mg Intravenous Daily Lisette Paz, PA-C      . DISCONTD: sodium chloride 0.9 % bolus 1,000 mL  1,000 mL Intravenous Once Lisette Paz, PA-C       Medications Prior to Admission    Medication Sig Dispense Refill  . diazepam (VALIUM) 5 MG tablet Take 5 mg by mouth every 8 (eight) hours as needed. For anxiety      . hydrochlorothiazide (,MICROZIDE/HYDRODIURIL,) 12.5 MG capsule Take 12.5 mg by mouth daily.       Marland Kitchen ibuprofen (ADVIL,MOTRIN) 800 MG tablet Take 1 tablet (800 mg total) by mouth 3 (three) times daily.  60 tablet  0  . nebivolol (BYSTOLIC) 5 MG tablet Take 5 mg by mouth daily.      Marland Kitchen omeprazole (PRILOSEC) 40 MG capsule Take 1 capsule in the morning and 1 capsule in the evening  60 capsule  3  . Potassium Gluconate 550 MG TABS Take 1 tablet by mouth daily.       . Probiotic Product (PROBIOTIC PO) Take 1 capsule by mouth daily.       . promethazine (PHENERGAN) 6.25 MG/5ML syrup Take 6.25 mg by mouth 4 (four) times daily as needed. For nausea/cough.        OB/GYN Status:  No LMP for male patient.  General Assessment Data Location of Assessment: [facility] ED Living Arrangements: Alone Can pt return to current living arrangement?: Yes Admission Status: Voluntary Is patient capable of signing voluntary admission?: Yes Transfer from: Acute Hospital Referral Source: MD  Education Status Is patient currently in school?: No Current Grade: None  Highest grade of school patient has completed: Unk  Name of school: [employer]  Contact person: None   Risk to self Suicidal Ideation: No Suicidal Intent: No Is patient at risk for suicide?: No Suicidal Plan?: No Access to Means: No What has been your use of drugs/alcohol within the last 12 months?: 1 pint ETOH daily Previous Attempts/Gestures: No How many times?: 0  Other Self Harm Risks: N/A Triggers for Past Attempts: None known Intentional Self Injurious Behavior: None Family Suicide History: No Recent stressful life event(s): Divorce;Other (Comment) (Mother has cancer) Persecutory voices/beliefs?: No Depression: Yes Depression Symptoms: Loss of interest in usual pleasures Substance abuse history and/or treatment for  substance abuse?: Yes Suicide prevention information given to non-admitted patients: Yes  Risk to Others Homicidal Ideation: No Thoughts of Harm to Others: No Current Homicidal Intent: No Current Homicidal Plan: No Access to Homicidal Means: No Identified Victim: N/A History of harm to others?: No Assessment of Violence: None Noted Violent Behavior Description: Calm, Cooperative Does patient have access to weapons?: No Criminal Charges Pending?: No Does patient have a court date: No  Psychosis Hallucinations: None noted Delusions: None noted  Mental Status Report Appear/Hygiene: Disheveled Eye Contact: Good Motor Activity: Tremors Speech: Logical/coherent Level of Consciousness:  Alert Mood: Depressed Affect: Depressed Anxiety Level: Minimal Thought Processes: Coherent;Relevant Judgement: Unimpaired Orientation: Person;Place;Time;Situation Obsessive Compulsive Thoughts/Behaviors: None  Cognitive Functioning Concentration: Normal Memory: Recent Intact;Remote Intact IQ: Average Insight: Fair Impulse Control: Fair Appetite: Good Weight Loss: 0  Weight Gain: 0  Sleep: No Change Total Hours of Sleep: 8  Vegetative Symptoms: None  Prior Inpatient Therapy Prior Inpatient Therapy: Yes Prior Therapy Dates: Unk  Prior Therapy Facilty/Provider(s): Unk  Reason for Treatment: Detox   Prior Outpatient Therapy Prior Outpatient Therapy: No Prior Therapy Dates: None  Prior Therapy Facilty/Provider(s): None  Reason for Treatment: None   ADL Screening (condition at time of admission) Patient's cognitive ability adequate to safely complete daily activities?: Yes Patient able to express need for assistance with ADLs?: Yes Independently performs ADLs?: Yes Weakness of Legs: None Weakness of Arms/Hands: None       Abuse/Neglect Assessment (Assessment to be complete while patient is alone) Physical Abuse: Denies Verbal Abuse: Denies Sexual Abuse: Denies Exploitation  of patient/patient's resources: Denies Self-Neglect: Denies Values / Beliefs Cultural Requests During Hospitalization: None Spiritual Requests During Hospitalization: None Consults Spiritual Care Consult Needed: No Social Work Consult Needed: No Merchant navy officer (For Healthcare) Advance Directive: Patient does not have advance directive;Patient would not like information Pre-existing out of facility DNR order (yellow form or pink MOST form): No    Additional Information 1:1 In Past 12 Months?: No CIRT Risk: No Elopement Risk: No Does patient have medical clearance?: Yes     Disposition:  Disposition Disposition of Patient: Outpatient treatment;Referred to (OPT referals provided) Type of inpatient treatment program: Adult Patient referred to:  [facility]; [facility]; [facility])  On Site Evaluation by:   Reviewed with Physician:     Romeo Apple 10/24/2011 2:27 PM

## 2011-10-24 NOTE — Discharge Instructions (Signed)
See her primary care doctor for a checkup in 5-7 days. Avoid alcohol.. Eat a bland diet. Return here if needed for problems.  Anemia, Nonspecific Your exam and blood tests show you are anemic. This means your blood (hemoglobin) level is low. Normal hemoglobin values are 12 to 15 g/dL for females and 14 to 17 g/dL for males. Make a note of your hemoglobin level today. The hematocrit percent is also used to measure anemia. A normal hematocrit is 38% to 46% in females and 42% to 49% in males. Make a note of your hematocrit level today. CAUSES  Anemia can be due to many different causes.  Excessive bleeding from periods (in women).   Intestinal bleeding.   Poor nutrition.   Kidney, thyroid, liver, and bone marrow diseases.  SYMPTOMS  Anemia can come on suddenly (acute). It can also come on slowly. Symptoms can include:  Minor weakness.   Dizziness.   Palpitations.   Shortness of breath.  Symptoms may be absent until half your hemoglobin is missing if it comes on slowly. Anemia due to acute blood loss from an injury or internal bleeding may require blood transfusion if the loss is severe. Hospital care is needed if you are anemic and there is significant continual blood loss. TREATMENT   Stool tests for blood (Hemoccult) and additional lab tests are often needed. This determines the best treatment.   Further checking on your condition and your response to treatment is very important. It often takes many weeks to correct anemia.  Depending on the cause, treatment can include:  Supplements of iron.   Vitamins B12 and folic acid.   Hormone medicines.If your anemia is due to bleeding, finding the cause of the blood loss is very important. This will help avoid further problems.  SEEK IMMEDIATE MEDICAL CARE IF:   You develop fainting, extreme weakness, shortness of breath, or chest pain.   You develop heavy vaginal bleeding.   You develop bloody or black, tarry stools or vomit up  blood.   You develop a high fever, rash, repeated vomiting, or dehydration.  Document Released: 09/20/2004 Document Revised: 04/25/2011 Document Reviewed: 06/28/2009 [facility] Patient Information 2012 [place], [place].Chronic Alcoholism Alcoholism is an addiction to alcohol. Addiction is a medical illness. It is not an one-time incident of heavy drinking that defines the disease of alcoholism.  The characteristics of addictive disease, such as alcoholism, include behaviors that the person finds pleasurable, at least initially. In alcohol addiction, drinking causes chemical changes in brain activity. This can lead to frequent cravings for alcohol. Unfortunately over time, an increased amount of alcohol is needed to produce the pleasure (tolerance). As a result, the person will start to feel uncomfortable symptoms when he or she is not drinking (withdrawal). Over time, a bad cycle develops. When painful withdrawal symptoms start to appear, alcohol is needed to make the symptoms go away. During this process, the person addicted to alcohol may become so used to the effects of alcohol that the usual signs of intoxication such as slurred speech, a staggering walk, or sleepiness may no longer be shown. Many people addicted to alcohol are able to function and even complete tasks. CAUSES   Drinking heavily and frequently.   Other factors like genetics.  SYMPTOMS   Headaches.   Frequent trouble falling or staying asleep (insomnia).   Irritability.   Uncontrolled shaking or movement (tremors).   Forgetting events (brownouts) or passing out (blackouts).   Seizures or hallucinations (delirium tremens).   Problems  at work or at home that are related to drinking.   Medical problems related to drinking such as heart disease, stroke, high blood pressure, diabetes, stomach ulcers, bleeding from the GI tract, and liver failure.   Trauma (falls, broken bones, automobile crashes).  TREATMENT  Alcoholism  usually gets worse over time and almost never gets better without treatment. Your caregiver can help recommend a course of treatment for you depending on how severe your symptoms are and the level of your alcohol abuse. In some patients, stopping alcohol use or even decreasing use can bring about withdrawal symptoms that are dangerous or even deadly. For this reason, hospitalization is sometimes required to medically stabilize a patient.  If hospitalization is not required, but the risk of withdrawal is high, a detoxification (detox) facility may be recommended as an initial treatment step. In a detox center, medications can be given to protect against seizures and other withdrawal symptoms. Rehabilitation treatment may also be necessary. This is the process of treating the psychological and lifestyle element of addiction. There is both inpatient and outpatient rehabilitation treatment. Inpatient programs help patients through a systematic plan of psychological questioning, both individually and in groups, and at times using a "twelve-step" format. Outpatient rehabilitation programs have a similar structure and are aimed at promoting continued sobriety and preventing relapse into addiction. Make treatment decisions together with your caregiver. HOME CARE INSTRUCTIONS   If you are concerned about your alcohol use, talk to someone who can help. Trying to quit on your own is not easy. It can even be medically dangerous. This is especially true if you have been drinking heavily for a long time.   Call your caregiver, Alcoholics Anonymous, or other alcoholic treatment programs for help.   AL-ANON and ALA-TEEN are support groups for friends and family members of an alcohol or drug dependent person. These people also often need help too. For information about these organizations, check your phone directory or the internet. You can also call a local alcohol or chemical dependency treatment center.  Document  Released: 09/20/2004 Document Revised: 04/25/2011 Document Reviewed: 01/27/2010 [facility] Patient Information 2012 [place], [place].Gastrointestinal Bleeding Gastrointestinal (GI) bleeding is bleeding from the gut or any place between your mouth and anus. If bleeding is slow, you may be allowed to go home. If there is a lot of bleeding, hospitalization and observation are often required. SYMPTOMS   You vomit bright red blood or material that looks like coffee grounds.   You have blood in your stools or the stools look black and tarry.  DIAGNOSIS  Your caregiver may diagnose your condition by taking a history and a physical exam. More tests may be needed, including:  X-rays.   EGD (esophagogastroduodenoscopy), which looks at your esophagus, stomach, and small bowel through a flexible telescope-like instrument.   Colonoscopy, which looks at your colon/large bowel through a flexible telescope-like instrument.   Biopsies, which remove a small sample of tissue to examine under a microscope.  Finding out the results of your test Not all test results are available during your visit. If your test results are not back during the visit, make an appointment with your caregiver to find out the results. Do not assume everything is normal if you have not heard from your caregiver or the medical facility. It is important for you to follow up on all of your test results. HOME CARE INSTRUCTIONS   Follow instructions as suggested by your caregiver regarding medicines. Do not take aspirin, drink alcohol, or  take medicines for pain and arthritis unless your caregiver says it is okay.   Get the suggested follow-up care when the tests are done.  SEEK IMMEDIATE MEDICAL CARE IF:   Your bleeding increases or you become lightheaded, weak, or pass out (faint).   You experience severe cramps in your stomach, back, or belly (abdomen).   You pass large clots.   The problems which brought you in for medical care get  worse.  MAKE SURE YOU:   Understand these instructions.   Will watch your condition.   Will get help right away if you are not doing well or get worse.  Document Released: 08/10/2000 Document Revised: 04/25/2011 Document Reviewed: 04/01/2008 [facility] Patient Information 2012 [place], [place].

## 2011-10-24 NOTE — ED Provider Notes (Addendum)
Daily rounding note: He is here voluntarily and wants a one-week detox program to help him get off alcohol. He states that he had a bad night because he was nervous and agitated. He has Valium ordered every 6 hours when necessary anxiety, but has only received 1 dose. At this time. He is calm and cooperative. His vital signs, have been normal. He has no tremor. He is alert and oriented x3. He is being evaluated by ACT  Flint Melter, MD 10/24/11 1048   Dr. Dan Humphreys called wondering about his anemia and guaiac-positive stool. CBC was not repeated today. I will order it. The remainder of his labs were reviewed and he has dehydration, and transaminitis likely related to his alcoholism.  His MCV is elevated, likely due to vitamin deficiency.  Component     Latest Ref Rng 10/09/2011 10/23/2011  WBC     4.0 - 10.5 K/uL 15.1 (H) 5.0  RBC     4.22 - 5.81 MIL/uL 3.72 (L) 3.09 (L)  HGB     13.0 - 17.0 g/dL 16.1 (L) 09.6 (L)  HCT     39.0 - 52.0 % 35.7 (L) 31.8 (L)  MCV     78.0 - 100.0 fL 96.0 102.9 (H)  MCH     26.0 - 34.0 pg 32.5 33.3  MCHC     30.0 - 36.0 g/dL 04.5 40.9  RDW     81.1 - 15.5 % 15.4 15.4  Platelets     150 - 400 K/uL 320 398   Component     Latest Ref Rng 10/20/2010 12/25/2010  WBC     4.0 - 10.5 K/uL 5.7 6.1  RBC     4.22 - 5.81 MIL/uL 4.70 4.33  HGB     13.0 - 17.0 g/dL 91.4 78.2  HCT     95.6 - 52.0 % 42.5 40.4  MCV     78.0 - 100.0 fL 90.4 93.3  MCH     26.0 - 34.0 pg 31.5 31.9  MCHC     30.0 - 36.0 g/dL 21.3 08.6  RDW     57.8 - 15.5 % 12.9 13.6  Platelets     150 - 400 K/uL 351 333   Component     Latest Ref Rng 05/23/2010 05/24/2010  WBC     4.0 - 10.5 K/uL 7.6 8.0  RBC     4.22 - 5.81 MIL/uL 3.08 (L) 3.22 (L)  HGB     13.0 - 17.0 g/dL 46.9 (L) 62.9 (L)  HCT     39.0 - 52.0 % 30.1 (L) 30.9 (L)  MCV     78.0 - 100.0 fL 97.7 96.0  MCH     26.0 - 34.0 pg 32.5 31.7  MCHC     30.0 - 36.0 g/dL 52.8 41.3  RDW     24.4 - 15.5 % 12.5 12.4   Platelets     150 - 400 K/uL 582 (H) 714 (H)   A similar problem occurred in September 2011 when he was admitted for alcoholism and alcoholic hepatitis. In the intervening time. His hemoglobin improved. See the above documentation.  Repeat Hb, 12:50 today, 8.9 mg/dl; possibly reflects dilution from fluid intake. No obvious BRBPR in the ED.   Patient has now been reassessed by ACT, and the patient has expressed a desire to go home and followup in the outpatient setting for his alcoholism. He tolerated lunch without problem  Diagnoses #1 alcoholism. #2 anemia. #3 GI  bleeding. #4 transaminitis   Plan: Outpatient followup for alcoholism. Recommended he see his PCP as soon as possible for a hemoglobin test. He is to stay on a bland diet. He is to avoid alcohol.  Flint Melter, MD 10/24/11 [phone]

## 2011-10-26 NOTE — ED Provider Notes (Signed)
Medical screening examination/treatment/procedure(s) were performed by non-physician practitioner and as supervising physician I was immediately available for consultation/collaboration. Pt is hemodynamically stable, medically cleared.  Heme positive trace stool likely due to gastritis which would be something he could follow up as outpt.  ACT consulted.   Martin Singh. Oletta Lamas, MD 10/26/11 1610

## 2011-10-29 ENCOUNTER — Ambulatory Visit (INDEPENDENT_AMBULATORY_CARE_PROVIDER_SITE_OTHER): Payer: Self-pay | Admitting: Internal Medicine

## 2011-10-29 ENCOUNTER — Encounter: Payer: Self-pay | Admitting: Internal Medicine

## 2011-10-29 DIAGNOSIS — I10 Essential (primary) hypertension: Secondary | ICD-10-CM

## 2011-10-29 DIAGNOSIS — E785 Hyperlipidemia, unspecified: Secondary | ICD-10-CM

## 2011-10-29 DIAGNOSIS — F1920 Other psychoactive substance dependence, uncomplicated: Secondary | ICD-10-CM

## 2011-10-29 DIAGNOSIS — R05 Cough: Secondary | ICD-10-CM

## 2011-10-29 DIAGNOSIS — K859 Acute pancreatitis without necrosis or infection, unspecified: Secondary | ICD-10-CM | POA: Insufficient documentation

## 2011-10-29 DIAGNOSIS — E781 Pure hyperglyceridemia: Secondary | ICD-10-CM

## 2011-10-29 MED ORDER — PROMETHAZINE HCL 6.25 MG/5ML PO SYRP: 6.2500 mg | ORAL_SOLUTION | Freq: Four times a day (QID) | ORAL | Status: DC | PRN

## 2011-10-29 NOTE — Assessment & Plan Note (Signed)
Recheck his FLP today 

## 2011-10-29 NOTE — Assessment & Plan Note (Signed)
His BP is well controlled 

## 2011-10-29 NOTE — Telephone Encounter (Signed)
Pt's wife informed. Transferred to scheduler.  

## 2011-10-29 NOTE — Patient Instructions (Signed)
Chronic Obstructive Pulmonary Disease Exacerbation Chronic obstructive pulmonary disease (COPD) is a condition that limits airflow. COPD may include chronic bronchitis, pulmonary emphysema, or both. A COPD exacerbation means that your COPD has gotten worse. Without treatment, this can be a life-threatening problem. COPD exacerbation requires immediate medical care. CAUSES  COPD exacerbation can be caused by:  Exposure to smoke.   Exposure to air pollution, chemical fumes, or dust.   Respiratory infections.   Genetics, particularly alpha 1-antitrypsin deficiency.   A condition in which the body's immune system attacks itself (autoimmunity).  SYMPTOMS   Increased coughing.   Increased wheezing.   Increased shortness of breath.   Swelling due to a buildup of fluid (peripheral edema) related to heart strain.   Rapid breathing.   Chest enlargement (barrel chest).   Chest tightness.  DIAGNOSIS  There is no single test that can diagnosis COPD exacerbation. Your history, physical exam, and other tests will help your caregiver make a diagnosis. Tests may include a chest X-ray, pulmonary function tests, spirometry, basic lab tests, and an arterial blood gas test. TREATMENT  Severe problems may require a stay in the hospital. Depending on the cause of your problems, the following may be prescribed:  Antibiotic medicines.   Bronchodilators (inhaled or tablets).   Cortisone medicines (inhaled or tablets).   Supplemental oxygen therapy.   Pulmonary rehabilitation. This is a broad program that may involve exercise, nutrition counseling, breathing techniques, and further education about your condition.  It is important to use good technique with inhaled medicines. Spacer devices may be needed to help improve drug delivery. HOME CARE INSTRUCTIONS   Do not smoke. Quitting smoking is very important to prevent worsening of COPD.   Avoid exposure to all substances that irritate the airway,  especially tobacco smoke.   If prescribed, take your antibiotics as directed. Finish them even if you start to feel better.   Only take over-the-counter or prescription medicines as directed by your caregiver.   Drink enough fluids to keep your urine clear or pale yellow. This can help thin bronchial secretions.   Use a cool mist vaporizer. This makes it easier to clear your chest when you cough.   If you have a home nebulizer and oxygen, continue to use them as directed.   Maintain all necessary vaccinations to prevent infections.   Exercise regularly.   Eat a healthy diet.   Keep all follow-up appointments as directed by your caregiver.  SEEK IMMEDIATE MEDICAL CARE IF:  You have extreme shortness of breath.   You have severe chest pain or blood in your sputum.   You have a high fever, weakness, repeated vomiting, or fainting.   You feel confused.  MAKE SURE YOU:   Understand these instructions.   Will watch your condition.   Will get help right away if you are not doing well or get worse.  Document Released: 06/10/2007 Document Revised: 08/02/2011 Document Reviewed: 04/10/2011 [employer] Patient Information 2012 [employer]. 

## 2011-10-29 NOTE — Assessment & Plan Note (Signed)
He will f/up with pulmonary soon and will continue phenergan-dm and tudorza in the meantime

## 2011-10-29 NOTE — Assessment & Plan Note (Signed)
He was admitted to the hospital one week ago with alcohol abuse and had a + UDS for benzo's and opiates, I have asked him to resubmit a UDS today to see if he is seeking sobriety, he has not yet been seen for his outpt treatment

## 2011-10-29 NOTE — Assessment & Plan Note (Signed)
His last lipid panel was bad so I have ordered a recheck today

## 2011-10-29 NOTE — Progress Notes (Signed)
Subjective:    Patient ID: Martin Singh, male    DOB: Apr 25, 1966, 46 y.o.   MRN: [id number]  Cough This is a chronic problem. The current episode started more than 1 year ago. The problem has been unchanged. The problem occurs every few hours. The cough is non-productive. Associated symptoms include postnasal drip, rhinorrhea and wheezing. Pertinent negatives include no chest pain, chills, ear congestion, ear pain, fever, headaches, heartburn, hemoptysis, myalgias, nasal congestion, rash, sore throat, shortness of breath, sweats or weight loss. Risk factors for lung disease include smoking/tobacco exposure. He has tried prescription cough suppressant and ipratropium inhaler for the symptoms. The treatment provided moderate relief. His past medical history is significant for bronchitis and COPD.      Review of Systems  Constitutional: Positive for unexpected weight change (weight gain). Negative for fever, chills, weight loss, diaphoresis, activity change, appetite change and fatigue.  HENT: Positive for rhinorrhea and postnasal drip. Negative for hearing loss, ear pain, nosebleeds, congestion, sore throat, facial swelling, sneezing, drooling, mouth sores, trouble swallowing, neck pain, dental problem, voice change and sinus pressure.   Eyes: Negative.   Respiratory: Positive for cough and wheezing. Negative for hemoptysis, choking, shortness of breath and stridor.   Cardiovascular: Negative for chest pain, palpitations and leg swelling.  Gastrointestinal: Negative for heartburn, nausea, vomiting, abdominal pain, diarrhea and constipation.  Genitourinary: Negative for dysuria, urgency, frequency, hematuria, decreased urine volume, enuresis and difficulty urinating.  Musculoskeletal: Negative for myalgias, back pain, joint swelling, arthralgias and gait problem.  Skin: Negative for color change, pallor, rash and wound.  Neurological: Negative for dizziness, tremors, seizures, syncope, facial  asymmetry, speech difficulty, weakness, light-headedness, numbness and headaches.  Hematological: Negative for adenopathy. Does not bruise/bleed easily.  Psychiatric/Behavioral: Positive for sleep disturbance. Negative for suicidal ideas, hallucinations, behavioral problems, confusion, self-injury, dysphoric mood, decreased concentration and agitation. The patient is nervous/anxious. The patient is not hyperactive.        Objective:   Physical Exam  Vitals reviewed. Constitutional: He is oriented to person, place, and time. He appears well-developed and well-nourished. No distress.  HENT:  Head: Normocephalic and atraumatic.  Mouth/Throat: Oropharynx is clear and moist. No oropharyngeal exudate.  Eyes: Conjunctivae are normal. Right eye exhibits no discharge. Left eye exhibits no discharge. No scleral icterus.  Neck: Normal range of motion. Neck supple. No JVD present. No tracheal deviation present. No thyromegaly present.  Cardiovascular: Normal rate, regular rhythm, normal heart sounds and intact distal pulses.  Exam reveals no gallop and no friction rub.   No murmur heard. Pulmonary/Chest: Effort normal and breath sounds normal. No stridor. No respiratory distress. He has no wheezes. He has no rales. He exhibits no tenderness.  Abdominal: Soft. Bowel sounds are normal. He exhibits no distension and no mass. There is no tenderness. There is no rebound and no guarding.  Musculoskeletal: Normal range of motion. He exhibits no edema and no tenderness.  Lymphadenopathy:    He has no cervical adenopathy.  Neurological: He is oriented to person, place, and time.  Skin: Skin is warm and dry. No rash noted. He is not diaphoretic. No erythema. No pallor.  Psychiatric: He has a normal mood and affect. His behavior is normal. Judgment and thought content normal.      Lab Results  Component Value Date   WBC 3.8* 10/24/2011   HGB 8.9* 10/24/2011   HCT 26.3* 10/24/2011   PLT 307 10/24/2011    GLUCOSE 96 10/23/2011   CHOL 609* 09/20/2011  TRIG 669.0* 09/20/2011   HDL 20.00* 09/20/2011   LDLDIRECT 484.0 09/20/2011   LDLCALC  Value: 34        Total Cholesterol/HDL:CHD Risk Coronary Heart Disease Risk Table                     Men   Women  1/2 Average Risk   3.4   3.3  Average Risk       5.0   4.4  2 X Average Risk   9.6   7.1  3 X Average Risk  23.4   11.0        Use the calculated Patient Ratio above and the CHD Risk Table to determine the patient's CHD Risk.        ATP III CLASSIFICATION (LDL):  <100     mg/dL   Optimal  161-096  mg/dL   Near or Above                    Optimal  130-159  mg/dL   Borderline  045-409  mg/dL   High  >811     mg/dL   Very High 05/10/7828   ALT 39 10/23/2011   AST 52* 10/23/2011   NA 145 10/23/2011   K 4.4 10/23/2011   CL 107 10/23/2011   CREATININE 1.37* 10/23/2011   BUN 24* 10/23/2011   CO2 29 10/23/2011   TSH 1.80 09/20/2011   INR 1.05 05/07/2010      Assessment & Plan:

## 2011-10-29 NOTE — Assessment & Plan Note (Signed)
He has no pain today, I will check his amylase and lipase today

## 2011-11-13 ENCOUNTER — Telehealth: Payer: Self-pay | Admitting: *Deleted

## 2011-11-14 NOTE — Telephone Encounter (Signed)
Wants to change PCP.  Advised that he would not be able to change at this time.

## 2011-11-16 ENCOUNTER — Other Ambulatory Visit: Payer: Self-pay | Admitting: Internal Medicine

## 2011-11-19 ENCOUNTER — Encounter (HOSPITAL_COMMUNITY): Payer: Self-pay | Admitting: Emergency Medicine

## 2011-11-19 ENCOUNTER — Emergency Department (HOSPITAL_COMMUNITY)
Admission: EM | Admit: 2011-11-19 | Discharge: 2011-11-19 | Disposition: A | Discharge status: Home / Self Care | Payer: Self-pay | Attending: Emergency Medicine | Admitting: Emergency Medicine

## 2011-11-19 ENCOUNTER — Emergency Department (HOSPITAL_COMMUNITY): Payer: Self-pay

## 2011-11-19 DIAGNOSIS — Z79899 Other long term (current) drug therapy: Secondary | ICD-10-CM | POA: Insufficient documentation

## 2011-11-19 DIAGNOSIS — F1010 Alcohol abuse, uncomplicated: Secondary | ICD-10-CM | POA: Insufficient documentation

## 2011-11-19 DIAGNOSIS — R0789 Other chest pain: Secondary | ICD-10-CM

## 2011-11-19 DIAGNOSIS — R071 Chest pain on breathing: Secondary | ICD-10-CM | POA: Insufficient documentation

## 2011-11-19 DIAGNOSIS — K589 Irritable bowel syndrome without diarrhea: Secondary | ICD-10-CM | POA: Insufficient documentation

## 2011-11-19 DIAGNOSIS — R079 Chest pain, unspecified: Secondary | ICD-10-CM | POA: Insufficient documentation

## 2011-11-19 DIAGNOSIS — W108XXA Fall (on) (from) other stairs and steps, initial encounter: Secondary | ICD-10-CM | POA: Insufficient documentation

## 2011-11-19 DIAGNOSIS — I10 Essential (primary) hypertension: Secondary | ICD-10-CM | POA: Insufficient documentation

## 2011-11-19 DIAGNOSIS — K219 Gastro-esophageal reflux disease without esophagitis: Secondary | ICD-10-CM | POA: Insufficient documentation

## 2011-11-19 MED ORDER — METHOCARBAMOL 500 MG PO TABS: 500.0000 mg | ORAL_TABLET | Freq: Two times a day (BID) | ORAL | Status: AC

## 2011-11-19 MED ORDER — OXYCODONE-ACETAMINOPHEN 5-325 MG PO TABS
2.0000 | ORAL_TABLET | Freq: Once | ORAL | Status: AC
  Administered 2011-11-19: 2 via ORAL
  Filled 2011-11-19: qty 2

## 2011-11-19 MED ORDER — IBUPROFEN 400 MG PO TABS: 400.0000 mg | ORAL_TABLET | Freq: Four times a day (QID) | ORAL | Status: AC | PRN

## 2011-11-19 NOTE — ED Notes (Signed)
Pt states he has an appt tomorrow with outpt for detox from etoh  States he has not drank in about a week

## 2011-11-19 NOTE — Discharge Instructions (Signed)
Chest Wall Pain Chest wall pain is pain in or around the bones and muscles of your chest. It may take up to 6 weeks to get better. It may take longer if you must stay physically active in your work and activities.  CAUSES  Chest wall pain may happen on its own. However, it may be caused by:  A viral illness like the flu.   Injury.   Coughing.   Exercise.   Arthritis.   Fibromyalgia.   Shingles.  HOME CARE INSTRUCTIONS   Avoid overtiring physical activity. Try not to strain or perform activities that cause pain. This includes any activities using your chest or your abdominal and side muscles, especially if heavy weights are used.   Put ice on the sore area.   Put ice in a plastic bag.   Place a towel between your skin and the bag.   Leave the ice on for 15 to 20 minutes per hour while awake for the first 2 days.   Only take over-the-counter or prescription medicines for pain, discomfort, or fever as directed by your caregiver.  SEEK IMMEDIATE MEDICAL CARE IF:   Your pain increases, or you are very uncomfortable.   You have a fever.   Your chest pain becomes worse.   You have new, unexplained symptoms.   You have nausea or vomiting.   You feel sweaty or lightheaded.   You have a cough with phlegm (sputum), or you cough up blood.  MAKE SURE YOU:   Understand these instructions.   Will watch your condition.   Will get help right away if you are not doing well or get worse.  Document Released: 08/13/2005 Document Revised: 08/02/2011 Document Reviewed: 04/09/2011 [facility] Patient Information 2012 [place], [place].  RESOURCE GUIDE  Dental Problems  Patients with Medicaid: [facility] [phone]                                           [phone] [employer] Phone:  [phone]                                                  Phone:  [phone]  If unable to pay or uninsured, contact:  [facility] or  [facility] to become qualified for the adult dental clinic.  Chronic Pain Problems Contact [facility]  [phone] Patients need to be referred by their primary care doctor.  Insufficient Money for Medicine Contact [employer]:  call "211" or [facility] [phone].  No Primary Care Doctor Call [facility]  [phone] Other agencies that provide inexpensive medical care    [facility]  [phone]    [facility]  [phone]    [facility]  [phone]    [facility]  [phone]    [facility]  [phone]    [facility]  [phone]  Psychological Services [facility]  [phone] [facility]  [phone] [facility]   [phone] (emergency services [phone])  Substance Abuse Resources [facility]  [phone] [facility] [phone] [facility] [phone] Floydene Flock [phone] Residential & Outpatient Substance Abuse Program  [phone]  Abuse/Neglect [facility] Child Abuse Hotline 262-756-1871 [facility] Child Abuse Hotline [phone] (After Hours)  Emergency Shelter [facility] [phone]  Maternity Homes [facility] at the [place] [facility] [place] [phone] [place] Alert Services [phone]  MRSA Hotline #:   416-481-8535    [facility] Resources  [facility]     [employer]                          [facility] Dept. [address] [place]                       [address]      [address]  Blondell Reveal Phone:  [phone]                                   Phone:  [phone]                 Phone:  [phone]  [facility] Phone:  [phone]  [facility] Child Abuse Hotline (343)542-6117 [phone] (After Hours)  Alcohol Intoxication You have alcohol intoxication when the amount of alcohol that you have consumed has impaired your ability to mentally and physically function. There are a variety of factors that contribute to the level at which alcohol intoxication can occur, such as age, gender, weight, frequency of alcohol consumption, medication use, and the presence of other medical conditions, such as diabetes, seizures, or heart conditions. The blood alcohol level test measures the concentration of alcohol in your blood. In most states, your blood alcohol level must be lower than 80 mg/dL (7.03%) to legally drive. However, many dangerous effects of alcohol can occur at much lower levels. Alcohol directly impairs the normal chemical activity of the brain and is said to be a chemical depressant. Alcohol can cause drowsiness, stupor, respiratory failure, and coma. Other physical effects can include headache, vomiting, vomiting of blood, abdominal pain, a fast heartbeat, difficulty breathing, anxiety, and amnesia. Alcohol intoxication can also lead to dangerous and life-threatening activities, such as fighting, dangerous operation of vehicles or heavy machinery, and risky sexual behavior. Alcohol can be especially dangerous when taken with other drugs. Some of these drugs are:  Sedatives.   Painkillers.   Marijuana.   Tranquilizers.   Antihistamines.   Muscle relaxants.   Seizure medicine.  Many of the effects of acute alcohol intoxication are temporary. However, repeated alcohol intoxication can lead to severe medical illnesses. If you have alcohol intoxication, you should:  Stay hydrated. Drink enough water and fluids to keep your urine clear or pale yellow. Avoid excessive caffeine because this can further lead to dehydration.   Eat a healthy diet. You may have residual nausea, headache, and loss of  appetite, but it is still important that you maintain good nutrition. You  can start with clear liquids.   Take nonsteroidal anti-inflammatory medications as needed for headaches, but make sure to do so with small meals. You should avoid acetaminophen for several days after having alcohol intoxication because the combination of alcohol and acetaminophen can be toxic to your liver.  If you have frequent alcohol intoxication, ask your friends and family if they think you have a drinking problem. For further help, contact:  Your caregiver.   Alcoholics Anonymous (AA).   A drug or alcohol rehabilitation program.  SEEK MEDICAL CARE IF:   You have persistent vomiting.   You have persistent pain in any part of your body.   You do not feel better after a few days.  SEEK IMMEDIATE MEDICAL CARE IF:   You become shaky or tremble when you try to stop drinking.   You shake uncontrollably (seizure).   You throw up (vomit) blood. This may be bright red or it may look like black coffee grounds.   You have blood in the stool. This may be bright red or appear as a black, tarry, bad smelling stool.   You become lightheaded or faint.  ANY OF THESE SYMPTOMS MAY REPRESENT A SERIOUS PROBLEM THAT IS AN EMERGENCY. Do not wait to see if the symptoms will go away. Get medical help right away. Call your local emergency services (911 in [place] DO NOT drive yourself to the hospital. MAKE SURE YOU:   Understand these instructions.   Will watch your condition.   Will get help right away if you are not doing well or get worse.  Document Released: 05/23/2005 Document Revised: 08/02/2011 Document Reviewed: 01/30/2010 [facility] Patient Information 2012 [place], [place].

## 2011-11-19 NOTE — ED Provider Notes (Signed)
History     CSN: [id number]  Arrival date & time 11/19/11  7829   First MD Initiated Contact with Patient 11/19/11 1958      Chief Complaint  Patient presents with  . Fall  . Chest Pain    (Consider location/radiation/quality/duration/timing/severity/associated sxs/prior treatment) HPI  Patient presents to the emergency department with complaints of chest wall injury. The patient states that he tripped down some stairs 3 weeks ago and fell against the side rail hitting his ribs. He has a red mark to his chest which was covered up by heating pack. He states that it has been hurting and not one specific location consistently for 3 weeks. Patient initially denies drinking alcohol but when I questioned him about swelling on his breath he then admits to an alcohol problem. He also admits to having an appointment for outpatient detox from alcohol tomorrow and says that he's owning up to it. He said he had some problems with Dr. Yetta Barre accusing him of being a drug abuser and alcoholic and he no longer wants to be seen by him saying that he would "hurt him if he saw him". Patient denies suicide ideation and denies actually having a plan or wanting to hurt Dr. Yetta Barre. He just states that he does not want to see him again not to he would actually hurt him. The patient states that he has chronic knee problems. She states that he has taken Robaxin from a friend and it helped all of his pains and is requesting some Robaxin and evaluation for his rib pain he denies the pain being intermittent he states the pain is worse or when he presses it. Pt denies SOB, LOC, chest pains, wheezing, weakness, headaches.  Past Medical History  Diagnosis Date  . Hypertension   . Depressive disorder, not elsewhere classified   . Osteoarthrosis, unspecified whether generalized or localized, unspecified site   . Irritable bowel syndrome   . Esophageal reflux   . Colon polyps     hyperplastic/  . Allergic rhinitis   .  Pancreatitis   . History of pneumonia   . Insomnia   . Anxiety   . Diverticulitis   . Alcohol abuse 2012    Past Surgical History  Procedure Date  . Tibula surgery     right  . Fibula fracture surgery     right    Family History  Problem Relation Age of Onset  . Heart disease Mother   . Gallbladder disease Mother   . Nephrolithiasis Mother   . Colon cancer Neg Hx   . Hypertension Father     moth  . Arthritis Other   . Hyperlipidemia Other   . Stroke Other     History  Substance Use Topics  . Smoking status: Former Smoker -- 1.5 packs/day for 18 years    Types: Cigarettes    Quit date: 08/27/1994  . Smokeless tobacco: Current User    Types: Snuff  . Alcohol Use: Yes     everyday      Review of Systems  All other systems reviewed and are negative.    Allergies  Codeine; Vicodin; Benazepril; Ativan; Corticosteroids; and Prednisone  Home Medications   Current Outpatient Rx  Name Route Sig Dispense Refill  . ACLIDINIUM BROMIDE 400 MCG/ACT IN AEPB Inhalation Inhale 2 puffs into the lungs 2 (two) times daily as needed. For bronchiospasms.    Marland Kitchen DIAZEPAM 5 MG PO TABS Oral Take 5 mg by mouth every 6 (six)  hours as needed. For anxiety    . HYDROCHLOROTHIAZIDE 12.5 MG PO CAPS Oral Take 12.5 mg by mouth daily.     . NEBIVOLOL HCL 5 MG PO TABS Oral Take 5 mg by mouth daily.    . OLOPATADINE HCL 0.6 % NA SOLN Nasal Place 1 puff into the nose at bedtime as needed. For dryness.    . OMEPRAZOLE 40 MG PO CPDR  Take 1 capsule in the morning and 1 capsule in the evening 60 capsule 3  . OXYMETAZOLINE HCL 0.05 % NA SOLN Nasal Place 2 sprays into the nose 2 (two) times daily.    Marland Kitchen POTASSIUM GLUCONATE 550 MG PO TABS Oral Take 1 tablet by mouth daily.     Marland Kitchen PROBIOTIC PO Oral Take 1 capsule by mouth daily.     Marland Kitchen PROMETHAZINE HCL 25 MG PO TABS Oral Take 25 mg by mouth every 6 (six) hours as needed. nausea    . TRAMADOL HCL 50 MG PO TABS Oral Take 50 mg by mouth every 6 (six) hours  as needed. For pain    . IBUPROFEN 400 MG PO TABS Oral Take 1 tablet (400 mg total) by mouth every 6 (six) hours as needed for pain. 30 tablet 0  . METHOCARBAMOL 500 MG PO TABS Oral Take 1 tablet (500 mg total) by mouth 2 (two) times daily. 20 tablet 0    BP 128/76  Pulse 73  Temp(Src) 98.8 F (37.1 C) (Oral)  Resp 20  SpO2 96%  Physical Exam  Nursing note and vitals reviewed. Constitutional: He appears well-developed and well-nourished. No distress.  HENT:  Head: Normocephalic and atraumatic.  Eyes: Pupils are equal, round, and reactive to light.  Neck: Normal range of motion. Neck supple.  Cardiovascular: Normal rate and regular rhythm.   Pulmonary/Chest: Effort normal. He exhibits tenderness (left lower ribs show 3inch by 4 inch red patch to it that appears to be a healing contusion. Its is pain to palpation and pressure.).  Abdominal: Soft.  Musculoskeletal: Normal range of motion.  Neurological: He is alert.  Skin: Skin is warm and dry.    ED Course  Procedures (including critical care time)  Labs Reviewed - No data to display Dg Chest 2 View  11/19/2011  *RADIOLOGY REPORT*  Clinical Data: Fall.  Left lower chest pain and shortness of breath.  Hypertension.  CHEST - 2 VIEW  Comparison: 10/09/2011  Findings: Heart size is normal.  The lungs are free of focal consolidations and pleural effusions.  No pulmonary edema. Visualized osseous structures have a normal appearance.  No evidence for pneumothorax.  IMPRESSION: Negative exam.  Original Report Authenticated By: Patterson Hammersmith, M.D.     1. Chest wall pain   2. Alcohol abuse       MDM  Pt appears to be intoxicated on exam but is ambulatory, denies offer for detox. He answers questions appropriately. He says that he is getting help tomorrow for his alcoholism.  I wrote pt Rx for Robaxin and for Ibuprofen and gave him a referral for a new PCP.Marland Kitchen Pt also given resource guide.  Pt has been advised of the symptoms  that warrant their return to the ED. Patient has voiced understanding and has agreed to follow-up with the PCP or specialist.         Dorthula Matas, PA 11/19/11 2206

## 2011-11-19 NOTE — ED Notes (Signed)
PA at bedside.

## 2011-11-19 NOTE — ED Notes (Signed)
[id number]> Expected date:<BR> Expected time: 7:12 PM<BR> Means of arrival:Ambulance<BR> Comments:<BR> M261 -- Fall/Rib Pain

## 2011-11-19 NOTE — ED Notes (Signed)
Pt brought in by EMS for c/o rib pain s/p fall a couple days ago  Pt states the pain is on the left side  Pt states he has a hot patch on it right now that is not helping much  Pt states it hurts to talk or breathe

## 2011-11-19 NOTE — ED Notes (Signed)
Pt awaiting xray

## 2011-11-20 NOTE — ED Provider Notes (Signed)
Medical screening examination/treatment/procedure(s) were performed by non-physician practitioner and as supervising physician I was immediately available for consultation/collaboration.    Deniya Craigo R Freya Zobrist, MD 11/20/11 0044 

## 2011-12-19 ENCOUNTER — Ambulatory Visit: Payer: Self-pay | Admitting: Internal Medicine

## 2011-12-20 ENCOUNTER — Encounter (HOSPITAL_COMMUNITY): Payer: Self-pay | Admitting: Emergency Medicine

## 2011-12-20 ENCOUNTER — Emergency Department (HOSPITAL_COMMUNITY)
Admission: EM | Admit: 2011-12-20 | Discharge: 2011-12-21 | Disposition: A | Discharge status: Other Acute Inpatient Hospital | Payer: Self-pay | Attending: Emergency Medicine | Admitting: Emergency Medicine

## 2011-12-20 DIAGNOSIS — F341 Dysthymic disorder: Secondary | ICD-10-CM | POA: Insufficient documentation

## 2011-12-20 DIAGNOSIS — F1010 Alcohol abuse, uncomplicated: Secondary | ICD-10-CM | POA: Insufficient documentation

## 2011-12-20 DIAGNOSIS — K589 Irritable bowel syndrome without diarrhea: Secondary | ICD-10-CM | POA: Insufficient documentation

## 2011-12-20 DIAGNOSIS — I10 Essential (primary) hypertension: Secondary | ICD-10-CM | POA: Insufficient documentation

## 2011-12-20 DIAGNOSIS — F1020 Alcohol dependence, uncomplicated: Secondary | ICD-10-CM | POA: Insufficient documentation

## 2011-12-20 DIAGNOSIS — K219 Gastro-esophageal reflux disease without esophagitis: Secondary | ICD-10-CM | POA: Insufficient documentation

## 2011-12-20 DIAGNOSIS — M1990 Unspecified osteoarthritis, unspecified site: Secondary | ICD-10-CM | POA: Insufficient documentation

## 2011-12-20 LAB — CBC
Hemoglobin: 11.6 g/dL — ABNORMAL LOW (ref 13.0–17.0)
MCH: 32.7 pg (ref 26.0–34.0)
MCHC: 33.8 g/dL (ref 30.0–36.0)
Platelets: 308 10*3/uL (ref 150–400)
RBC: 3.55 MIL/uL — ABNORMAL LOW (ref 4.22–5.81)

## 2011-12-20 LAB — COMPREHENSIVE METABOLIC PANEL
ALT: 111 U/L — ABNORMAL HIGH (ref 0–53)
AST: 143 U/L — ABNORMAL HIGH (ref 0–37)
Albumin: 3.5 g/dL (ref 3.5–5.2)
Alkaline Phosphatase: 202 U/L — ABNORMAL HIGH (ref 39–117)
Calcium: 9.2 mg/dL (ref 8.4–10.5)
Potassium: 3.5 mEq/L (ref 3.5–5.1)
Sodium: 139 mEq/L (ref 135–145)
Total Protein: 6.8 g/dL (ref 6.0–8.3)

## 2011-12-20 LAB — ETHANOL
Alcohol, Ethyl (B): 259 mg/dL — ABNORMAL HIGH (ref 0–11)
Alcohol, Ethyl (B): 101 mg/dL — ABNORMAL HIGH (ref 0–11)

## 2011-12-20 LAB — RAPID URINE DRUG SCREEN, HOSP PERFORMED: Barbiturates: NOT DETECTED

## 2011-12-20 MED ORDER — ACETAMINOPHEN 325 MG PO TABS
ORAL_TABLET | ORAL | Status: AC
  Filled 2011-12-20: qty 2

## 2011-12-20 MED ORDER — NICOTINE 21 MG/24HR TD PT24
21.0000 mg | MEDICATED_PATCH | Freq: Every day | TRANSDERMAL | Status: DC
  Administered 2011-12-20 – 2011-12-21 (×2): 21 mg via TRANSDERMAL
  Filled 2011-12-20 (×2): qty 1

## 2011-12-20 MED ORDER — ALUM & MAG HYDROXIDE-SIMETH 200-200-20 MG/5ML PO SUSP: 30.0000 mL | ORAL | Status: DC | PRN

## 2011-12-20 MED ORDER — ACETAMINOPHEN 325 MG PO TABS: 650.0000 mg | ORAL_TABLET | ORAL | Status: DC | PRN

## 2011-12-20 MED ORDER — ZOLPIDEM TARTRATE 5 MG PO TABS: 5.0000 mg | ORAL_TABLET | Freq: Every evening | ORAL | Status: DC | PRN

## 2011-12-20 MED ORDER — ACETAMINOPHEN 325 MG PO TABS
650.0000 mg | ORAL_TABLET | Freq: Once | ORAL | Status: AC
  Administered 2011-12-20: 650 mg via ORAL

## 2011-12-20 MED ORDER — DIAZEPAM 5 MG PO TABS
10.0000 mg | ORAL_TABLET | Freq: Four times a day (QID) | ORAL | Status: DC | PRN
  Administered 2011-12-20 – 2011-12-21 (×3): 10 mg via ORAL
  Filled 2011-12-20 (×3): qty 2

## 2011-12-20 MED ORDER — ONDANSETRON HCL 8 MG PO TABS: 4.0000 mg | ORAL_TABLET | Freq: Three times a day (TID) | ORAL | Status: DC | PRN

## 2011-12-20 NOTE — ED Notes (Addendum)
Pt reports that he wants detox from ETOH, pt reports he has a rehab already but needs to be medically cleared before they will be able to take them; ETOH abuse for about 1 year; pt reports drinking 1/2 pint a day of brandy, pt reports last time drank was a few hours ago; pt A&OX4; resp e/u; pt slow to respond and forgetful; pts pupils 4mm equal reactive; pt voluntary; denies SI and HI

## 2011-12-20 NOTE — ED Provider Notes (Signed)
History     CSN: [id number]  Arrival date & time 12/20/11  1547   First MD Initiated Contact with Patient 12/20/11 1740      Chief Complaint  Patient presents with  . Medical Clearance     HPI Pt reports that he wants detox from ETOH, pt reports he has a rehab already but needs to be medically cleared before they will be able to take them; ETOH abuse for about 1 year; pt reports drinking 1/2 pint a day of brandy, pt reports last time drank was a few hours ago; pt A&OX4; resp e/u; pt slow to respond and forgetful; pts pupils 4mm equal reactive; pt voluntary; denies SI and HI  Past Medical History  Diagnosis Date  . Hypertension   . Depressive disorder, not elsewhere classified   . Osteoarthrosis, unspecified whether generalized or localized, unspecified site   . Irritable bowel syndrome   . Esophageal reflux   . Colon polyps     hyperplastic/  . Allergic rhinitis   . Pancreatitis   . History of pneumonia   . Insomnia   . Anxiety   . Diverticulitis   . Alcohol abuse 2012    Past Surgical History  Procedure Date  . Tibula surgery     right  . Fibula fracture surgery     right    Family History  Problem Relation Age of Onset  . Heart disease Mother   . Gallbladder disease Mother   . Nephrolithiasis Mother   . Colon cancer Neg Hx   . Hypertension Father     moth  . Arthritis Other   . Hyperlipidemia Other   . Stroke Other     History  Substance Use Topics  . Smoking status: Former Smoker -- 1.5 packs/day for 18 years    Types: Cigarettes    Quit date: 08/28/1991  . Smokeless tobacco: Current User    Types: Chew  . Alcohol Use: Yes     everyday 1/2 pint daily      Review of Systems All other systems are negative Allergies  Codeine; Vicodin; Benazepril; Ativan; Corticosteroids; and Prednisone  Home Medications   No current outpatient prescriptions on file.  BP 134/82  Pulse 70  Temp(Src) 98.7 F (37.1 C) (Oral)  Resp 14  Ht 5\' 2"  (1.575 m)   Wt 160 lb (72.576 kg)  BMI 29.26 kg/m2  SpO2 97%  Physical Exam  Nursing note and vitals reviewed. Constitutional: He is oriented to person, place, and time. He appears well-developed and well-nourished. No distress.  HENT:  Head: Normocephalic and atraumatic.  Eyes: Pupils are equal, round, and reactive to light.  Neck: Normal range of motion.  Cardiovascular: Normal rate and intact distal pulses.   Pulmonary/Chest: No respiratory distress.  Abdominal: Normal appearance. He exhibits no distension.  Musculoskeletal: Normal range of motion.  Neurological: He is alert and oriented to person, place, and time. No cranial nerve deficit.  Skin: Skin is warm and dry. No rash noted.  Psychiatric: He has a normal mood and affect. His behavior is normal.    ED Course  Procedures (including critical care time)  Labs Reviewed  CBC - Abnormal; Notable for the following:    RBC 3.55 (*)    Hemoglobin 11.6 (*)    HCT 34.3 (*)    RDW 16.6 (*)    All other components within normal limits  COMPREHENSIVE METABOLIC PANEL - Abnormal; Notable for the following:    Glucose, Bld 114 (*)  AST 143 (*)    ALT 111 (*)    Alkaline Phosphatase 202 (*)    GFR calc non Af Amer 77 (*)    GFR calc Af Amer 89 (*)    All other components within normal limits  ETHANOL - Abnormal; Notable for the following:    Alcohol, Ethyl (B) 259 (*)    All other components within normal limits  URINE RAPID DRUG SCREEN (HOSP PERFORMED) - Abnormal; Notable for the following:    Benzodiazepines POSITIVE (*)    All other components within normal limits  ETHANOL - Abnormal; Notable for the following:    Alcohol, Ethyl (B) 101 (*)    All other components within normal limits  LAB REPORT - SCANNED   No results found.   1. Chronic alcoholism       MDM         Nelia Shi, MD 12/22/11 [phone]

## 2011-12-20 NOTE — ED Notes (Addendum)
Pt reports ETOH abuse x 30 yrs. Avg 1-2 pints daily. Denies SI, HI. Last drink PTA.  Was instructed by [employer] that he needs inpatient detox & he needs to be medically cleared first.  Has never been to rehab in past 15 yrs.

## 2011-12-20 NOTE — BH Assessment (Addendum)
Assessment Note   Martin Singh is an 46 y.o. male.  Mr. Martin Singh came to [facility] to request detox from ETOH.  He is currently drinking one pint of brandy daily for the last two years.  His last drink was this morning (04/25) and was a little less than a pint.  Mr. Martin Singh admits to some depression concerning his drinking and his job loss.  He does see Martin Singh at [facility].  He currently denies any HI, SI or A/V hallucinations.  Mr. Martin Singh reports being in [employer] and graduating from that program in 2001.  He does report chronic pain in his right leg which came from a fall.  He also reports that he thinks that he may have had a seizure last week when he tried to go off ETOH for a few days.  Mr. Martin Singh also reports that he has experienced hallucinations and aggression when on Ativan.  He uses tramidol but said that he can go without it.  Mr. Martin Singh was declined at [facility] and [facility] because they do not do valium detox protocol.  Both places also sited that his elevated liver functions were of concern to them for doing a librium protocol.  Mr. Martin Singh will be reviewed tomorrow for a detox bed at [facility] as one becomes available.  Dr. Radford Singh informed.   Axis I: 303.90 ETOH dependence Axis II: Deferred Axis III:  Past Medical History  Diagnosis Date  . Hypertension   . Depressive disorder, not elsewhere classified   . Osteoarthrosis, unspecified whether generalized or localized, unspecified site   . Irritable bowel syndrome   . Esophageal reflux   . Colon polyps     hyperplastic/  . Allergic rhinitis   . Pancreatitis   . History of pneumonia   . Insomnia   . Anxiety   . Diverticulitis   . Alcohol abuse 2012   Axis IV: economic problems and occupational problems Axis V: 31-40 impairment in reality testing  Past Medical History:  Past Medical History  Diagnosis Date  . Hypertension   . Depressive disorder, not elsewhere classified   . Osteoarthrosis, unspecified whether generalized or  localized, unspecified site   . Irritable bowel syndrome   . Esophageal reflux   . Colon polyps     hyperplastic/  . Allergic rhinitis   . Pancreatitis   . History of pneumonia   . Insomnia   . Anxiety   . Diverticulitis   . Alcohol abuse 2012    Past Surgical History  Procedure Date  . Tibula surgery     right  . Fibula fracture surgery     right    Family History:  Family History  Problem Relation Age of Onset  . Heart disease Mother   . Gallbladder disease Mother   . Nephrolithiasis Mother   . Colon cancer Neg Hx   . Hypertension Father     moth  . Arthritis Other   . Hyperlipidemia Other   . Stroke Other     Social History:  reports that he quit smoking about 20 years ago. His smoking use included Cigarettes. He has a 27 pack-year smoking history. His smokeless tobacco use includes Chew. He reports that he drinks alcohol. He reports that he does not use illicit drugs.  Additional Social History:  Alcohol / Drug Use Pain Medications: Tramidol Prescriptions: Tramidol is taken for pain in right let Over the Counter: N/A History of alcohol / drug use?: Yes Withdrawal Symptoms:  Nausea / Vomiting;Cramps;Weakness;Sweats;Fever / Chills;Diarrhea;Tremors Substance #1 Name of Substance 1: ETOH.  Usually drinks brandy 1 - Age of First Use: 46 years of age 85 - Amount (size/oz): 1 pint 1 - Frequency: Daily 1 - Duration: On-going for the last two years 1 - Last Use / Amount: 04/25 drank a little less than one pint Allergies:  Allergies  Allergen Reactions  . Codeine Anaphylaxis  . Vicodin (Hydrocodone-Acetaminophen) Anaphylaxis  . Benazepril Cough  . Ativan Other (See Comments)    Becomes violent and hallucinates  . Corticosteroids Other (See Comments)    Shaky and high blood pressure  . Prednisone Other (See Comments)    High blood pressure and shaky     Home Medications:  (Not in a hospital admission)  OB/GYN Status:  No LMP for male patient.  General  Assessment Data Location of Assessment: [facility] Living Arrangements: Spouse/significant other Can pt return to current living arrangement?: Yes Admission Status: Voluntary Is patient capable of signing voluntary admission?: Yes Transfer from: Acute Hospital Referral Source: Self/Family/Friend     Risk to self Suicidal Ideation: No Suicidal Intent: No Is patient at risk for suicide?: No Suicidal Plan?: No Access to Means: No What has been your use of drugs/alcohol within the last 12 months?: Daily use of ETOH Previous Attempts/Gestures: No How many times?: 0  Other Self Harm Risks: N/A Triggers for Past Attempts: None known Intentional Self Injurious Behavior: None Family Suicide History: No Recent stressful life event(s): Job Loss Persecutory voices/beliefs?: No Depression: Yes Depression Symptoms: Loss of interest in usual pleasures;Fatigue;Isolating Substance abuse history and/or treatment for substance abuse?: Yes Suicide prevention information given to non-admitted patients: Not applicable  Risk to Others Homicidal Ideation: No Thoughts of Harm to Others: No Current Homicidal Intent: No Current Homicidal Plan: No Access to Homicidal Means: No Identified Victim: No one History of harm to others?: No Assessment of Violence: None Noted Violent Behavior Description: Calm and cooperative Does patient have access to weapons?: No Criminal Charges Pending?: No Does patient have a court date: No  Psychosis Hallucinations: None noted Delusions: None noted  Mental Status Report Appear/Hygiene:  (Casual) Eye Contact: Good Motor Activity: Unremarkable Speech: Logical/coherent Level of Consciousness: Alert Mood: Depressed;Sad Affect: Depressed Anxiety Level: Minimal Thought Processes: Coherent;Relevant Judgement: Unimpaired Orientation: Person;Place;Time;Situation Obsessive Compulsive Thoughts/Behaviors: None  Cognitive Functioning Concentration: Normal Memory:  Recent Intact;Remote Intact IQ: Average Insight: Fair Impulse Control: Poor Appetite: Good Weight Loss: 0  Weight Gain: 0  Sleep: No Change Total Hours of Sleep: 12  Vegetative Symptoms: None  Prior Inpatient Therapy Prior Inpatient Therapy: Yes Prior Therapy Dates: 2001 Prior Therapy Facilty/Provider(s): Graduated from [employer] Reason for Treatment: Detox and Rehab  Prior Outpatient Therapy Prior Outpatient Therapy: No Prior Therapy Dates: Last 6 weeks Prior Therapy Facilty/Provider([facility]  counselor Martin Singh Reason for Treatment: Depression/Anxiety  ADL Screening (condition at time of admission) Patient's cognitive ability adequate to safely complete daily activities?: Yes Patient able to express need for assistance with ADLs?: Yes Independently performs ADLs?: Yes Weakness of Legs: Right (Uses handrails going up/down steps.  Injured leg in a falll.) Weakness of Arms/Hands: None  Home Assistive Devices/Equipment Home Assistive Devices/Equipment: None    Abuse/Neglect Assessment (Assessment to be complete while patient is alone) Physical Abuse: Denies Verbal Abuse: Denies Sexual Abuse: Denies Exploitation of patient/patient's resources: Denies Self-Neglect: Denies     Merchant navy officer (For Healthcare) Advance Directive: Patient does not have advance directive;Patient would not like information  Additional Information 1:1 In Past 12 Months?: No CIRT Risk: No Elopement Risk: No Does patient have medical clearance?: Yes     Disposition:  Disposition Disposition of Patient: Referred to;Inpatient treatment program Type of inpatient treatment program: Adult Type of outpatient treatment: Adult Patient referred to: [facility]  On Site Evaluation by:   Reviewed with Physician:  Dr. Clearance Coots, Berna Spare Ray 12/20/2011 8:55 PM

## 2011-12-20 NOTE — ED Notes (Signed)
Urine Specimen collected

## 2011-12-20 NOTE — ED Notes (Signed)
Behavioral Health at bedside for pt evaluation. 

## 2011-12-21 ENCOUNTER — Inpatient Hospital Stay (HOSPITAL_COMMUNITY)
Admission: AD | Admit: 2011-12-21 | Discharge: 2011-12-25 | DRG: 897 | Disposition: A | Discharge status: Home / Self Care | Payer: PRIVATE HEALTH INSURANCE | Source: Ambulatory Visit | Attending: Psychiatry | Admitting: Psychiatry

## 2011-12-21 DIAGNOSIS — F329 Major depressive disorder, single episode, unspecified: Secondary | ICD-10-CM

## 2011-12-21 DIAGNOSIS — M1990 Unspecified osteoarthritis, unspecified site: Secondary | ICD-10-CM | POA: Diagnosis present

## 2011-12-21 DIAGNOSIS — I10 Essential (primary) hypertension: Secondary | ICD-10-CM | POA: Diagnosis present

## 2011-12-21 DIAGNOSIS — F1010 Alcohol abuse, uncomplicated: Secondary | ICD-10-CM | POA: Diagnosis present

## 2011-12-21 DIAGNOSIS — K5730 Diverticulosis of large intestine without perforation or abscess without bleeding: Secondary | ICD-10-CM | POA: Diagnosis present

## 2011-12-21 DIAGNOSIS — F10939 Alcohol use, unspecified with withdrawal, unspecified: Principal | ICD-10-CM | POA: Diagnosis present

## 2011-12-21 DIAGNOSIS — F1920 Other psychoactive substance dependence, uncomplicated: Secondary | ICD-10-CM

## 2011-12-21 DIAGNOSIS — K219 Gastro-esophageal reflux disease without esophagitis: Secondary | ICD-10-CM | POA: Diagnosis present

## 2011-12-21 DIAGNOSIS — Z860100 Personal history of colon polyps, unspecified: Secondary | ICD-10-CM

## 2011-12-21 DIAGNOSIS — K589 Irritable bowel syndrome without diarrhea: Secondary | ICD-10-CM | POA: Diagnosis present

## 2011-12-21 DIAGNOSIS — F1020 Alcohol dependence, uncomplicated: Secondary | ICD-10-CM | POA: Diagnosis present

## 2011-12-21 DIAGNOSIS — F10239 Alcohol dependence with withdrawal, unspecified: Principal | ICD-10-CM | POA: Diagnosis present

## 2011-12-21 MED ORDER — CHLORDIAZEPOXIDE HCL 25 MG PO CAPS
25.0000 mg | ORAL_CAPSULE | ORAL | Status: AC
  Administered 2011-12-24 (×2): 25 mg via ORAL
  Filled 2011-12-21 (×2): qty 1

## 2011-12-21 MED ORDER — CHLORDIAZEPOXIDE HCL 25 MG PO CAPS
25.0000 mg | ORAL_CAPSULE | Freq: Three times a day (TID) | ORAL | Status: AC
  Administered 2011-12-23 (×3): 25 mg via ORAL
  Filled 2011-12-21 (×3): qty 1

## 2011-12-21 MED ORDER — CHLORDIAZEPOXIDE HCL 25 MG PO CAPS
25.0000 mg | ORAL_CAPSULE | Freq: Every day | ORAL | Status: AC
  Administered 2011-12-25: 25 mg via ORAL
  Filled 2011-12-21: qty 1

## 2011-12-21 MED ORDER — CHLORDIAZEPOXIDE HCL 25 MG PO CAPS
50.0000 mg | ORAL_CAPSULE | Freq: Once | ORAL | Status: AC
  Administered 2011-12-21: 50 mg via ORAL
  Filled 2011-12-21: qty 2

## 2011-12-21 MED ORDER — ONDANSETRON 4 MG PO TBDP: 4.0000 mg | ORAL_TABLET | Freq: Four times a day (QID) | ORAL | Status: AC | PRN

## 2011-12-21 MED ORDER — THIAMINE HCL 100 MG/ML IJ SOLN
100.0000 mg | Freq: Once | INTRAMUSCULAR | Status: AC
  Administered 2011-12-21: 100 mg via INTRAMUSCULAR

## 2011-12-21 MED ORDER — CHLORDIAZEPOXIDE HCL 25 MG PO CAPS
25.0000 mg | ORAL_CAPSULE | Freq: Four times a day (QID) | ORAL | Status: AC
  Administered 2011-12-21 – 2011-12-22 (×4): 25 mg via ORAL
  Filled 2011-12-21 (×4): qty 1

## 2011-12-21 MED ORDER — HYDROXYZINE HCL 25 MG PO TABS
25.0000 mg | ORAL_TABLET | Freq: Four times a day (QID) | ORAL | Status: AC | PRN
  Administered 2011-12-22 – 2011-12-24 (×3): 25 mg via ORAL
  Filled 2011-12-21: qty 1

## 2011-12-21 MED ORDER — LOPERAMIDE HCL 2 MG PO CAPS: 2.0000 mg | ORAL_CAPSULE | ORAL | Status: AC | PRN

## 2011-12-21 MED ORDER — MAGNESIUM HYDROXIDE 400 MG/5ML PO SUSP
30.0000 mL | Freq: Every day | ORAL | Status: DC | PRN
  Administered 2011-12-23: 30 mL via ORAL

## 2011-12-21 MED ORDER — VITAMIN B-1 100 MG PO TABS
100.0000 mg | ORAL_TABLET | Freq: Every day | ORAL | Status: DC
  Administered 2011-12-22 – 2011-12-25 (×4): 100 mg via ORAL
  Filled 2011-12-21 (×5): qty 1

## 2011-12-21 MED ORDER — ADULT MULTIVITAMIN W/MINERALS CH
1.0000 | ORAL_TABLET | Freq: Every day | ORAL | Status: DC
  Administered 2011-12-22 – 2011-12-25 (×4): 1 via ORAL
  Filled 2011-12-21 (×5): qty 1

## 2011-12-21 MED ORDER — CHLORDIAZEPOXIDE HCL 25 MG PO CAPS
25.0000 mg | ORAL_CAPSULE | Freq: Four times a day (QID) | ORAL | Status: AC | PRN
  Administered 2011-12-22 – 2011-12-23 (×2): 25 mg via ORAL
  Filled 2011-12-21 (×2): qty 1

## 2011-12-21 NOTE — ED Notes (Signed)
Spoke with Consulting civil engineer of [facility], requested pt be sent to [facility] in 45 mins

## 2011-12-21 NOTE — ED Notes (Signed)
Loni Dolly fiance [phone]

## 2011-12-21 NOTE — BH Assessment (Signed)
Assessment Note   Martin Singh is an 46 y.o. male.   Axis I: Alcohol Dependence Axis II: Deferred Axis III:  Past Medical History  Diagnosis Date  . Hypertension   . Depressive disorder, not elsewhere classified   . Osteoarthrosis, unspecified whether generalized or localized, unspecified site   . Irritable bowel syndrome   . Esophageal reflux   . Colon polyps     hyperplastic/  . Allergic rhinitis   . Pancreatitis   . History of pneumonia   . Insomnia   . Anxiety   . Diverticulitis   . Alcohol abuse 2012   Axis IV: other psychosocial or environmental problems, problems related to social environment and problems with primary support group Axis V: 31-40 impairment in reality testing  Past Medical History:  Past Medical History  Diagnosis Date  . Hypertension   . Depressive disorder, not elsewhere classified   . Osteoarthrosis, unspecified whether generalized or localized, unspecified site   . Irritable bowel syndrome   . Esophageal reflux   . Colon polyps     hyperplastic/  . Allergic rhinitis   . Pancreatitis   . History of pneumonia   . Insomnia   . Anxiety   . Diverticulitis   . Alcohol abuse 2012    Past Surgical History  Procedure Date  . Tibula surgery     right  . Fibula fracture surgery     right    Family History:  Family History  Problem Relation Age of Onset  . Heart disease Mother   . Gallbladder disease Mother   . Nephrolithiasis Mother   . Colon cancer Neg Hx   . Hypertension Father     moth  . Arthritis Other   . Hyperlipidemia Other   . Stroke Other     Social History:  reports that he quit smoking about 20 years ago. His smoking use included Cigarettes. He has a 27 pack-year smoking history. His smokeless tobacco use includes Chew. He reports that he drinks alcohol. He reports that he does not use illicit drugs.  Additional Social History:  Alcohol / Drug Use Pain Medications: Tramidol Prescriptions: Tramidol is taken for pain  in right let Over the Counter: N/A History of alcohol / drug use?: Yes Withdrawal Symptoms: Nausea / Vomiting;Cramps;Weakness;Sweats;Fever / Chills;Diarrhea;Tremors Substance #1 Name of Substance 1: ETOH.  Usually drinks brandy 1 - Age of First Use: 46 years of age 54 - Amount (size/oz): 1 pint 1 - Frequency: Daily 1 - Duration: On-going for the last two years 1 - Last Use / Amount: 04/25 drank a little less than one pint Allergies:  Allergies  Allergen Reactions  . Codeine Anaphylaxis  . Vicodin (Hydrocodone-Acetaminophen) Anaphylaxis  . Benazepril Cough  . Ativan Other (See Comments)    Becomes violent and hallucinates  . Corticosteroids Other (See Comments)    Shaky and high blood pressure  . Prednisone Other (See Comments)    High blood pressure and shaky     Home Medications:  (Not in a hospital admission)  OB/GYN Status:  No LMP for male patient.  General Assessment Data Location of Assessment: [facility] ED Living Arrangements: Spouse/significant other Can pt return to current living arrangement?: Yes Admission Status: Voluntary Is patient capable of signing voluntary admission?: Yes Transfer from: Acute Hospital Referral Source: Self/Family/Friend  Education Status Is patient currently in school?: No  Risk to self Suicidal Ideation: No Suicidal Intent: No Is patient at risk for suicide?: No Suicidal Plan?: No Access  to Means: No What has been your use of drugs/alcohol within the last 12 months?: daily use of ETOH Previous Attempts/Gestures: No How many times?: 0  Other Self Harm Risks: n/a Triggers for Past Attempts: None known Intentional Self Injurious Behavior: None Family Suicide History: No Recent stressful life event(s): Divorce;Job Loss Persecutory voices/beliefs?: No Depression: Yes Depression Symptoms: Loss of interest in usual pleasures Substance abuse history and/or treatment for substance abuse?: Yes Suicide prevention information given to  non-admitted patients: Not applicable  Risk to Others Homicidal Ideation: No Thoughts of Harm to Others: No Current Homicidal Intent: No Current Homicidal Plan: No Access to Homicidal Means: No Identified Victim: no one History of harm to others?: No Assessment of Violence: None Noted Violent Behavior Description: calm and cooperative Does patient have access to weapons?: No Criminal Charges Pending?: No Does patient have a court date: No  Psychosis Hallucinations: None noted Delusions: None noted  Mental Status Report Appear/Hygiene: Disheveled Eye Contact: Good Motor Activity: Tremors Speech: Logical/coherent Level of Consciousness: Alert Mood: Depressed Affect: Depressed Anxiety Level: Minimal Thought Processes: Coherent Judgement: Unimpaired Orientation: Person;Place;Time;Situation Obsessive Compulsive Thoughts/Behaviors: None  Cognitive Functioning Concentration: Normal Memory: Recent Impaired;Remote Impaired IQ: Average Insight: Fair Impulse Control: Poor Appetite: Good Weight Loss: 0  Weight Gain: 0  Sleep: No Change Total Hours of Sleep: 9  Vegetative Symptoms: None  Prior Inpatient Therapy Prior Inpatient Therapy: Yes Prior Therapy Dates: 2001 Prior Therapy Facilty/Provider(s): [employer] [facility] Reason for Treatment: detox and rehabilitation  Prior Outpatient Therapy Prior Outpatient Therapy: No Prior Therapy Dates: six weeks worth Prior Therapy Facilty/Provider(s): [facility] Reason for Treatment: depression/SA  ADL Screening (condition at time of admission) Patient's cognitive ability adequate to safely complete daily activities?: Yes Patient able to express need for assistance with ADLs?: Yes Independently performs ADLs?: Yes Weakness of Legs: Right (Uses handrails going up/down steps.  Injured leg in a falll.) Weakness of Arms/Hands: None  Home Assistive Devices/Equipment Home Assistive Devices/Equipment: None      Abuse/Neglect Assessment (Assessment to be complete while patient is alone) Physical Abuse: Denies Verbal Abuse: Denies Sexual Abuse: Denies Exploitation of patient/patient's resources: Denies Self-Neglect: Denies Values / Beliefs Cultural Requests During Hospitalization: None Spiritual Requests During Hospitalization: None   Advance Directives (For Healthcare) Advance Directive: Patient does not have advance directive;Patient would not like information Nutrition Screen Diet: Regular  Additional Information 1:1 In Past 12 Months?: No CIRT Risk: No Elopement Risk: No Does patient have medical clearance?: Yes     Disposition:  Pt has been accepted to Dr. Glenetta Borg by Shelda Jakes for inpatient psychiatric and substance abuse treatment.  All necessary paperwork completed and faxed to [facility].  On Site Evaluation by:  Ethelda Chick Reviewed with Physician:     Angelica Ran 12/21/2011 2:35 PM

## 2011-12-21 NOTE — ED Provider Notes (Addendum)
8 AM patient resting comfortably offers no complaint. Alert Glasgow Coma Score 15.  Doug Sou, MD 12/21/11 1610  4:50 PM patient is alert and untoward Glasgow Coma Score 15 appears comfortable Stable for transfer to [facility].  Doug Sou, MD 12/21/11 [phone]

## 2011-12-21 NOTE — ED Notes (Signed)
Pt leaving [facility] in route to [facility] with security and ED-EMT. Pt has all belongings with him.

## 2011-12-21 NOTE — ED Notes (Signed)
Pt remains cooperative. Pt c/o mild-moderate anxiety. Prn valium administered for tremors. No new events to report.

## 2011-12-21 NOTE — Progress Notes (Signed)
Patient ID: Martin Singh, male   DOB: Mar 15, 1966, 46 y.o.   MRN: [id number] 46 year old male admitted voluntarily for ETOH detox. Reports drinking off an on since the age of 45. Has been drinking steadily over the last three years. Reports drinking 1/2 pint of brandy per day. Last drink was yesterday at noon. Is having withdrawal symptoms of anxiety, tremors and headache. An episode of diarrhea occurred this morning. Patient will be started on the librium detox protocol this evening. Patient very pleasant and cooperative during the admission. Asked appropriate and interested questions about what to expect from his time here at [facility]. Oriented to the 300 hall unit and routine.

## 2011-12-21 NOTE — ED Notes (Signed)
Pt remains cooperative with staff, verbalizes understanding of plan of care. No new events.

## 2011-12-22 DIAGNOSIS — F1010 Alcohol abuse, uncomplicated: Secondary | ICD-10-CM | POA: Diagnosis present

## 2011-12-22 MED ORDER — HYDROCHLOROTHIAZIDE 12.5 MG PO CAPS
12.5000 mg | ORAL_CAPSULE | Freq: Every day | ORAL | Status: DC
  Administered 2011-12-22 – 2011-12-25 (×4): 12.5 mg via ORAL
  Filled 2011-12-22 (×5): qty 1

## 2011-12-22 MED ORDER — PANTOPRAZOLE SODIUM 40 MG PO TBEC
40.0000 mg | DELAYED_RELEASE_TABLET | Freq: Every day | ORAL | Status: DC
  Administered 2011-12-22 – 2011-12-25 (×4): 40 mg via ORAL
  Filled 2011-12-22 (×4): qty 1

## 2011-12-22 MED ORDER — FLUOXETINE HCL 20 MG PO CAPS
20.0000 mg | ORAL_CAPSULE | Freq: Every day | ORAL | Status: DC
  Administered 2011-12-22 – 2011-12-25 (×4): 20 mg via ORAL
  Filled 2011-12-22 (×6): qty 1

## 2011-12-22 MED ORDER — OXYMETAZOLINE HCL 0.05 % NA SOLN: 2.0000 | Freq: Two times a day (BID) | NASAL | Status: DC | PRN

## 2011-12-22 MED ORDER — NEBIVOLOL HCL 5 MG PO TABS
5.0000 mg | ORAL_TABLET | Freq: Every day | ORAL | Status: DC
  Administered 2011-12-22 – 2011-12-25 (×4): 5 mg via ORAL
  Filled 2011-12-22 (×5): qty 1

## 2011-12-22 MED ORDER — TRAMADOL HCL 50 MG PO TABS
50.0000 mg | ORAL_TABLET | Freq: Four times a day (QID) | ORAL | Status: DC | PRN
  Administered 2011-12-23 – 2011-12-24 (×3): 50 mg via ORAL
  Filled 2011-12-22 (×3): qty 1

## 2011-12-22 MED ORDER — POTASSIUM GLUCONATE 550 MG PO TABS: 1.0000 | ORAL_TABLET | Freq: Every day | ORAL | Status: DC

## 2011-12-22 MED ORDER — NICOTINE 21 MG/24HR TD PT24
MEDICATED_PATCH | TRANSDERMAL | Status: AC
  Administered 2011-12-22: 21 mg via TRANSDERMAL
  Filled 2011-12-22: qty 1

## 2011-12-22 MED ORDER — POTASSIUM GLUCONATE 595 (99 K) MG PO TABS
595.0000 mg | ORAL_TABLET | Freq: Every day | ORAL | Status: DC
  Administered 2011-12-22 – 2011-12-25 (×4): 595 mg via ORAL
  Filled 2011-12-22 (×5): qty 1

## 2011-12-22 NOTE — [facility] Suicide Risk Assessment (Signed)
Suicide Risk Assessment  Admission Assessment     Demographic factors:  Assessment Details Time of Assessment: Admission Information Obtained From: Patient Current Mental Status:    Loss Factors:  Loss Factors: Decline in physical health Historical Factors:    Risk Reduction Factors:  Risk Reduction Factors: Responsible for children under 47 years of age;Sense of responsibility to family;Positive social support;Positive therapeutic relationship  CLINICAL FACTORS:   Depression:   Anhedonia Comorbid alcohol abuse/dependence Hopelessness Impulsivity Insomnia Dysthymia Alcohol/Substance Abuse/Dependencies  COGNITIVE FEATURES THAT CONTRIBUTE TO RISK:  Loss of executive function Polarized thinking    SUICIDE RISK:   Minimal: No identifiable suicidal ideation.  Patients presenting with no risk factors but with morbid ruminations; may be classified as minimal risk based on the severity of the depressive symptoms  PLAN OF CARE: Admitted for alcohol detox.   Lacorey Brusca,JANARDHAHA R. 12/22/2011, 11:58 AM

## 2011-12-22 NOTE — Progress Notes (Signed)
[facility] Adult Inpatient Family/Significant Other Suicide Prevention Education  Suicide Prevention Education:  Education Completed; Sir  [phone] or [phone]-Pt.'s Fiance- has been identified by the patient as the family member/significant other with whom the patient will be residing, and identified as the person(s) who will aid the patient in the event of a mental health crisis (suicidal ideations/suicide attempt).  With written consent from the patient, the family member/significant other has been provided the following suicide prevention education, prior to the and/or following the discharge of the patient.  The suicide prevention education provided includes the following:  Suicide risk factors  Suicide prevention and interventions  National Suicide Hotline telephone number  [facility] assessment telephone number  [facility] Emergency Assistance 911  [facility] and/or Residential Mobile Crisis Unit telephone number  Request made of family/significant other to:  Remove weapons (e.g., guns, rifles, knives), all items previously/currently identified as safety concern.Pt.'s  Fiance states that the pt. Has no weapons or guns.    Remove drugs/medications (over-the-counter, prescriptions, illicit drugs), all items previously/currently identified as a safety concern.Pt.'s fiance will secure the pt.'s home before the pt. Comes home. Pt.'s fiancee states that the pt. has struggles with alcohol and tobacco use since age 46.  Pt. Has relapsed several times and is at [facility] to detox from alcohol use.   The family member/significant other verbalizes understanding of the suicide prevention education information provided.  The family member/significant other agrees to remove the items of safety concern listed above.  Lamar Blinks Goodrich 12/22/2011, 4:49 PM

## 2011-12-22 NOTE — Progress Notes (Signed)
Patient ID: Martin Singh, male   DOB: 1965/12/08, 46 y.o.   MRN: [id number]  [facility] Group Notes:  (Counselor/Nursing/MHT/Case Management/Adjunct)  12/22/2011 1:15 PM  Type of Therapy:  Group Therapy, Dance/Movement Therapy   Participation Level:  Active  Participation Quality:  Appropriate, Attentive, Sharing and Supportive  Affect:  Appropriate  Cognitive:  Appropriate  Insight:  Limited  Engagement in Group:  Good  Engagement in Therapy:  Good  Modes of Intervention:  Clarification, Problem-solving, Role-play, Socialization and Support  Summary of Progress/Problems: Group explored concepts of seeing where each person is in their recovery and identifying where each person needs to go to have lasting sobriety. Individals were invited to draw a mountain and place them selves on the mountain. Inividuals then described personal challenges and spoke about personal fears. Pt stated that to him "mountians mean sobriety" and "wellness". Pt spoke about wanting to be sober and what he would receive as benefits when achieving this sobriety.

## 2011-12-22 NOTE — Progress Notes (Signed)
Slept poorly last nite, appetite is improving, energy level is high and ability to pay attention is improving, depressed 7/10 and hopeless 8/10, denies Si or Hi, attending group and interacting w/peers in the dayroom, eating meals in the DR and taking meds as prescribed by MD, pleasant and cooperative q48min safety checks continue and support offered Safety maintained

## 2011-12-22 NOTE — Progress Notes (Signed)
Pt requests librium for tremors and vistaril for sleep; meds given as ordered. Pt pleasant and cooperative. No s/s of distress noted at this time.
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
## 2011-12-22 NOTE — Progress Notes (Signed)
Patient ID: Martin Singh, male   DOB: 1965-11-17, 46 y.o.   MRN: [id number] Pt. attended and participated in aftercare planning group. Pt. Verbally accepted information on suicide prevention, warning signs to look for with suicide and crisis line numbers to use. The pt. agreed to call crisis line numbers if having warning signs or having thoughts of suicide. Pt. listed their current anxiety level as 8 and depression as a 7. Pt was active made eye contact with peers and therapist.
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
## 2011-12-22 NOTE — Progress Notes (Signed)
[facility] Group Notes:  (Counselor/Nursing/MHT/Case Management/Adjunct)  12/22/2011 7:36 PM  Type of Therapy:  Psychoeducational Skills  Participation Level:  Minimal  Participation Quality:  Appropriate  Affect:  Appropriate  Cognitive:  Appropriate  Insight:  Good  Engagement in Group:  Good  Engagement in Therapy:  Good  Modes of Intervention:  Education  Summary of Progress/Problems:Pt did well at identifying needs and healthy and unhealthy behaviors for meeting them. Pt participation was appropriate.   Martin Singh 12/22/2011, 7:36 PM

## 2011-12-23 DIAGNOSIS — F1010 Alcohol abuse, uncomplicated: Secondary | ICD-10-CM

## 2011-12-23 MED ORDER — IBUPROFEN 600 MG PO TABS: 600.0000 mg | ORAL_TABLET | Freq: Four times a day (QID) | ORAL | Status: DC | PRN

## 2011-12-23 MED ORDER — NICOTINE 21 MG/24HR TD PT24
21.0000 mg | MEDICATED_PATCH | Freq: Every day | TRANSDERMAL | Status: DC
  Administered 2011-12-23 – 2011-12-25 (×3): 21 mg via TRANSDERMAL
  Filled 2011-12-23 (×4): qty 1

## 2011-12-23 NOTE — Progress Notes (Signed)
Patient ID: Martin Singh, male   DOB: 04/10/1966, 46 y.o.   MRN: [id number] Pt. attended and participated in aftercare planning group. Pt. accepted information on suicide prevention, warning signs to look for with suicide and crisis line numbers to use. The pt. agreed to call crisis line numbers if having warning signs or having thoughts of suicide. Pt. listed their current anxiety level as  1 and depression as a 1 on a scale of 1 to 10 with 10 as the high.

## 2011-12-23 NOTE — H&P (Signed)
Patient was seen personally and evaluated for suicidal ideations and case discussed with Mrs. Adams and agree with the findings and treatment plan.  

## 2011-12-23 NOTE — Progress Notes (Signed)
Pt visible in milieu, attended AA group tonight. He reports having a fairly good day but is still experiencing tremors from his etoh withdrawal, some anxiety. Experiencing arthritic pain of an 8 in his feet, ankles which he reports is worsened by weather changes. Tramadol given for pain, librium for withdrawal and vistaril to promote sleep. On reassess, pt has decreased to a 5 and pt reports feeling sleepy, less anxious. He denies any SI/HI/AVH and remains safe. Martin Singh

## 2011-12-23 NOTE — Progress Notes (Signed)
Patient ID: ELDRA WORD, male   DOB: 03-12-1966, 46 y.o.   MRN: [id number] 12-23-11 nursing shift note: pt has been visible in the milieu. He has no overt w/d symptoms except some anxiety. He got some tramadol for pain and it was effective. He also got some mom for mild constipation, although her had a b/m 12-22-11. He also got a nicotine patch.  He has gone to meals and participated in groups. He denies any si/hi/av. He is here for etoh detox. He has been cooperative/ polite. rn will monitor and q 15 min checks continue.
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
## 2011-12-23 NOTE — Progress Notes (Signed)
Patient ID: ESTIBEN MIZUNO, male   DOB: 11/17/1965, 46 y.o.   MRN: [id number]  [facility] Group Notes:  (Counselor/Nursing/MHT/Case Management/Adjunct)  12/23/2011 1:15 PM  Type of Therapy:  Group Therapy, Dance/Movement Therapy   Participation Level:  Minimal  Participation Quality:  Appropriate  Affect:  Appropriate  Cognitive:  Appropriate  Insight:  Limited  Engagement in Group:  Limited  Engagement in Therapy:  Limited  Modes of Intervention:  Clarification, Problem-solving, Role-play, Socialization and Support  Summary of Progress/Problems:  Therapist discussed the definition of healthy supports. Therpist asked group " how can you distinguish between good and unhealthy support.  Additionally, therapist passed out a "Sheduling" worksheet that can allow each individual to list  positive activities that will aid in their personal  journey to recovery.  Therapist asked group to name one positive support to seek out in times of need . Pt. stated" supports help hold you up.  Attending AA meetings are a good source of support." Pt. acknowledges the ability to obtain various supports.    Rhunette Croft

## 2011-12-24 NOTE — Progress Notes (Signed)
Patient ID: Martin Singh, male   DOB: May 04, 1966, 46 y.o.   MRN: [id number] He has been up and about and to groups,interacting with peers and staff. He denies thoughts of SI and reports no withdrawal symptoms.  He has requested and received prn tramadol this AM for leg and  Back pain. Medication was effective.

## 2011-12-24 NOTE — Progress Notes (Signed)
Pt reports he is still having mild tremors, but denies any other withdrawal symptoms.  He denies SI/HI to this Clinical research associate.  He reports that he will probably be discharged home tomorrow.  He plans to follow up with [facility] [place] for meetings.  He feels this will work best for him and he is already familiar with them.  He voices no needs/concerns at this time.  Safety maintained with q15 minute checks.

## 2011-12-24 NOTE — BHH Suicide Risk Assessment (Signed)
Suicide Risk Assessment  Discharge Assessment     Demographic factors: Male;Caucasian;Low socioeconomic status  Current Mental Status Per Nursing Assessment::   At Discharge:  Pt denied any SI/HI/thoughts of self harm or acute psychiatric issues in treatment team with clinical, nursing and medical team present.  Current Mental Status Per Physician: Patient seen and evaluated on 4/29/ and 12/25/11 on team. Chart reviewed. Patient stated that his mood was "real good". His affect was mood congruent and euthymic. He denied any current thoughts of self injurious behavior, suicidal ideation or homicidal ideation. He denied any significant depressive signs or symptoms at this time. There were no auditory or visual hallucinations, paranoia, delusional thought processes, or mania noted.  Thought process was linear and goal directed.  No psychomotor agitation or retardation was noted. His speech was normal rate, tone and volume. Eye contact was good. Judgment and insight are fair.  Patient has been up and engaged on the unit.  No acute safety concerns reported from team.  Slept "real good" last night.  Loss Factors: Decline in physical health  Historical Factors:  Longstanding alcoholism; sees benefit of AA; f/u at [facility]   Risk Reduction Factors: Responsible for children under 97 years of age;Sense of responsibility to family;Positive social support;Positive therapeutic relationship; willing to continue medications and therapy  Continued Clinical Symptoms: mild UE bilat tremor  Discharge Diagnoses:   AXIS I: Alcohol Dependence & W/D, resolved; Depressive Disorder NOS AXIS II:  Deferred AXIS III:   Past Medical History  Diagnosis Date  . Hypertension   . Depressive disorder, not elsewhere classified   . Osteoarthrosis, unspecified whether generalized or localized, unspecified site   . Irritable bowel syndrome   . Esophageal reflux   . Colon polyps     hyperplastic/  . Allergic rhinitis   .  Pancreatitis   . History of pneumonia   . Insomnia   . Anxiety   . Diverticulitis   . Alcohol abuse 2012   AXIS IV: moderate AXIS V:  55  Cognitive Features That Contribute To Risk: limited insight.  Suicide Risk: Pt viewed as a chronic moderate increased risk of harm to self in light of his past hx and risk factors.  No acute safety concerns since on the unit.  Pt contracting for safety and detox complete.  Plan Of Care/Follow-up recommendations: Pt seen and evaluated in treatment team. Chart reviewed.  Pt stable for and requesting discharge. Pt contracting for safety and does not currently meet [place] involuntary commitment criteria for continued hospitalization against his will.  Mental health treatment, medication management and continued sobriety will mitigate against the potential increased risk of harm to self and/or others.  Discussed the importance of recovery further with pt, as well as, tools to move forward in a healthy & safe manner.  Pt agreeable with the plan.  Discussed with the team.  Please see orders, follow up appointments per AVS and full discharge summary to be completed by physician extender.  Recommend follow up with AA, rec 90/90  Diet: Heart Healthy.  Activity: As tolerated.     Lupe Carney 12/24/2011, 4:17 PM

## 2011-12-24 NOTE — Progress Notes (Signed)
[facility] Group Notes:  (Counselor/Nursing/MHT/Case Management/Adjunct)  12/24/2011 3:09 PM   Type of Therapy:  Processing Group at 11:00 am  Participation Level:  Minimal  Participation Quality:  Attentive  Affect:  Appropriate  Cognitive:  Oriented  Insight:  None shared  Engagement in Group:  Limited  Engagement in Therapy:  Limited  Modes of Intervention:  Exploration, support, and problem solving  Summary of Progress/Problems:  Group topic of focus was overcoming Obstacles.  Patients shared obstacles related to family and relationships, home environment or lack thereof, boredom and motivation.  Hamp was very attentive to group discussion but did not share.    [facility] Group Notes:  (Counselor/Nursing/MHT/Case Management/Adjunct)  12/24/2011    Type of Therapy:  Counseling Group at 1:15 pm  Participation Level:  Minimum  Participation Quality:  Attentive  Affect:  Appropriate  Cognitive:  Oriented  Insight:  None shared  Engagement in Group:  Minimal  Engagement in Therapy:  Minimal  Modes of Intervention:  Education, socialization  Summary of Progress/Problems:  [place] was present for presentation my [facility] [place] and appeared attentive.  It was not until volunteer left that he requested directions, hours etc.   Ronda Fairly, LCSWA 12/24/2011 3:54 PM

## 2011-12-25 DIAGNOSIS — F329 Major depressive disorder, single episode, unspecified: Secondary | ICD-10-CM

## 2011-12-25 DIAGNOSIS — F1020 Alcohol dependence, uncomplicated: Secondary | ICD-10-CM

## 2011-12-25 MED ORDER — PROBIOTIC PO CAPS: 1.0000 | ORAL_CAPSULE | Freq: Every day | ORAL | Status: DC

## 2011-12-25 MED ORDER — POTASSIUM GLUCONATE 550 MG PO TABS: 1.0000 | ORAL_TABLET | Freq: Every day | ORAL | Status: DC

## 2011-12-25 MED ORDER — NEBIVOLOL HCL 5 MG PO TABS: 5.0000 mg | ORAL_TABLET | Freq: Every day | ORAL | Status: DC

## 2011-12-25 MED ORDER — OMEPRAZOLE 40 MG PO CPDR: DELAYED_RELEASE_CAPSULE | ORAL | Status: DC

## 2011-12-25 MED ORDER — FLUOXETINE HCL 20 MG PO CAPS: 20.0000 mg | ORAL_CAPSULE | Freq: Every day | ORAL | Status: DC

## 2011-12-25 MED ORDER — HYDROCHLOROTHIAZIDE 12.5 MG PO CAPS: 12.5000 mg | ORAL_CAPSULE | Freq: Every day | ORAL | Status: DC

## 2011-12-25 MED ORDER — OXYMETAZOLINE HCL 0.05 % NA SOLN: 2.0000 | Freq: Two times a day (BID) | NASAL | Status: DC

## 2011-12-25 NOTE — Progress Notes (Signed)
Patient ID: Martin Singh, male   DOB: 1965/09/27, 46 y.o.   MRN: [id number] He has been up and about and to group, interacting with peers and staff. Has been smiling and joking with peers.  Self inventory Depression 1, hopelessness 1,  Reports w/d symptoms of craving.

## 2011-12-25 NOTE — Progress Notes (Signed)
Patient ID: Martin Singh, male   DOB: [birth year], 46 y.o.   MRN: [id number] Pt has been discharged home, picked up be family. He voiced understanding of discharge instruction and of follow up plan. He denies SI thoughts and all belonging taken home with him.

## 2011-12-25 NOTE — Progress Notes (Signed)
[facility] Case Management Discharge Plan:  Will you be returning to the same living situation after discharge: Yes,  return home At discharge, do you have transportation home?:Yes,  fiance to transport pt home Do you have the ability to pay for your medications:Yes,  access to meds  Release of information consent forms completed and in the chart;  Patient's signature needed at discharge.  Patient to Follow up at:  Follow-up Information    Follow up with [facility] [place] on 12/26/2011. (SA-IOP and therapy with [name])    Contact information:   [address], [place] [address] [phone]         Patient denies SI/HI:   Yes,  denies SI/Hi    Safety Planning and Suicide Prevention discussed:  Yes,  discussed with pt  Barrier to discharge identified:No.  Summary and Recommendations: Pt attended discharge planning group and actively participated.  Pt presents with calm mood and affect.  Pt denies having depression, anxiety and SI.  Pt reports feeling stable to d/c today.  No recommendations from SW.  No further needs voiced by pt.  Pt stable to discharge.     Carmina Miller 12/25/2011, 10:30 AM

## 2011-12-25 NOTE — Tx Team (Signed)
Interdisciplinary Treatment Plan Update (Adult)  Date:  12/25/2011  Time Reviewed:  9:53 AM   Progress in Treatment: Attending groups: Yes Participating in groups:  Yes Taking medication as prescribed: Yes Tolerating medication:  Yes Family/Significant othe contact made:  Yes, contact made with pt's fiance Patient understands diagnosis:  Yes Discussing patient identified problems/goals with staff:  Yes Medical problems stabilized or resolved:  Yes Denies suicidal/homicidal ideation: Yes Issues/concerns per patient self-inventory:  None identified Other: N/A  New problem(s) identified: None Identified  Reason for Continuation of Hospitalization: Stable to d/c  Interventions implemented related to continuation of hospitalization: Stable to d/c  Additional comments: N/A  Estimated length of stay: D/C today  Discharge Plan: Pt will follow up at [facility] the [place] for medication management and therapy and AA meetings - 90 meetings in 90 days  New goal(s): N/A  Review of initial/current patient goals per problem list:    1.  Goal(s): Address substance use  Met:  Yes  Target date: by discharge  As evidenced by: completed detox protocol and referred to appropriate treatment  2.  Goal (s): Reduce depressive and anxiety symptoms  Met:  Yes  Target date: by discharge  As evidenced by: Reducing depression from a 10 to a 3 as reported by pt.  Pt denies having depression or anxiety.    3.  Goal(s): Eliminate SI  Met:  Yes  Target date: by discharge  As evidenced by: pt denies SI   Attendees: Patient:  Martin Singh 12/25/2011 9:57 AM   Family:     Physician:  Lupe Carney, DO 12/25/2011 9:53 AM   Nursing: Roswell Miners, RN 12/25/2011 9:53 AM   Case Manager:  Reyes Ivan, LCSWA 12/25/2011  9:53 AM   Counselor:  Ronda Fairly, LCSWA 12/25/2011  9:53 AM   Other:  Richelle Ito, LCSW 12/25/2011 9:53 AM   Other:  Robbie Louis, RN   Other:     Other:        Scribe for Treatment Team:   Reyes Ivan 12/25/2011 9:53 AM

## 2011-12-25 NOTE — Progress Notes (Signed)
[facility] Group Notes:  (Counselor/Nursing/MHT/Case Management/Adjunct)  12/25/2011 12:42 PM  Type of Therapy:  Group Therapy  Participation Level:  Active  Participation Quality:  Attentive and Sharing  Affect:  Appropriate  Cognitive:  Appropriate  Insight:  Good  Engagement in Group:  Good  Engagement in Therapy:  Good  Modes of Intervention:  Support and Exploration  Summary of Progress/Problems: Focus of this processing group was feelings about diagnosis; discussion included denial, bargaining, anger, depression, and acceptance to which patients related to in varying degrees.   Martin Singh shared in general way his experience with each stage at different intervals of his journey.  Patient was helpful to other group members by sharing his honest experience in authentic manner.    Martin Singh 12/25/2011, 12:47 PM

## 2011-12-27 NOTE — Progress Notes (Signed)
Patient Discharge Instructions:  No consent for [facility] [place].  Wandra Scot, 12/27/2011, 1:53 PM

## 2011-12-31 ENCOUNTER — Other Ambulatory Visit: Payer: Self-pay | Admitting: Adult Health

## 2012-01-01 NOTE — Discharge Summary (Signed)
Physician Discharge Summary Note  Patient:  Martin Singh is an 46 y.o., male MRN:  [id number] DOB:  11-27-1965 Patient phone:  [phone] (home)  Patient address:   [address] [address] [place] [address],   Date of Admission:  12/21/2011 Date of Discharge: 12/25/2011  Discharge Diagnoses:  AXIS I: Alcohol Dependence & W/D, resolved; Depressive Disorder NOS  AXIS II: Deferred  AXIS III:  Past Medical History   Diagnosis  Date   .  Hypertension    .  Depressive disorder, not elsewhere classified    .  Osteoarthrosis, unspecified whether generalized or localized, unspecified site    .  Irritable bowel syndrome    .  Esophageal reflux    .  Colon polyps      hyperplastic/   .  Allergic rhinitis    .  Pancreatitis    .  History of pneumonia    .  Insomnia    .  Anxiety    .  Diverticulitis    .  Alcohol abuse  2012   AXIS IV: moderate  AXIS V: 55  Level of Care:  OP  Hospital Course:   First admission for Marino who presented in our emergency room requesting detox from alcohol. He was referred for inpatient detox due to his descriptions of an event last week which he feared was a seizure, when he attempted to stop drinking on his own. He has not had a witnessed seizure and never treated for seizures. Liver enzymes were also elevated with SGOT 143, SGPT 111, alkaline phosphatase 202, total bilirubin 0.9. His initial alcohol screen was found to be 259 mg percent. He and he reported that he was taking diazepam 5 mg as needed for anxiety; his total daily dose was unclear.  He was admitted to our dual diagnosis unit where he admitted to having some depression over the last year due to complications from alcohol abuse, including job loss. He was started on a Librium protocol which he tolerated well. Valium was discontinued. His detox was uneventful. He remained fully alert, with no signs of delirium, and no acute withdrawal symptoms. He denied suicidal plans or intent. His current therapy  attendance and participation was satisfactory. He worked with our counselors to identify relapse triggers, and improve his coping mechanisms. He was ready for discharge by 12/25/2011.  Consults:  None  Significant Diagnostic Studies:  See above  Discharge Vitals:   Blood pressure 113/77, pulse 69, temperature 98.1 F (36.7 C), temperature source Oral, resp. rate 12, height 5' 0.5" (1.537 m), weight 75.751 kg (167 lb), SpO2 98.00%.  Mental Status Exam: See Mental Status Examination and Suicide Risk Assessment completed by Attending Physician prior to discharge.  Discharge destination:  Home  Is patient on multiple antipsychotic therapies at discharge:  No   Has Patient had three or more failed trials of antipsychotic monotherapy by history:  No  Recommended Plan for Multiple Antipsychotic Therapies: N/A   Medication List  As of 01/01/2012  9:43 AM   STOP taking these medications         diazepam 5 MG tablet         TAKE these medications      Indication    FLUoxetine 20 MG capsule   Commonly known as: PROZAC   Take 1 capsule (20 mg total) by mouth daily. For depression and anxiety       hydrochlorothiazide 12.5 MG capsule   Commonly known as: MICROZIDE   Take 1  capsule (12.5 mg total) by mouth daily. For blood pressure/fluid       nebivolol 5 MG tablet   Commonly known as: BYSTOLIC   Take 1 tablet (5 mg total) by mouth daily. For blood pressure       omeprazole 40 MG capsule   Commonly known as: PRILOSEC   Take 1 capsule in the morning and 1 capsule in the evening for Acid Reflux.       oxymetazoline 0.05 % nasal spray   Commonly known as: AFRIN   Place 2 sprays into the nose 2 (two) times daily. For nasal congestion.       Potassium Gluconate 550 MG Tabs   Take 1 tablet (550 mg total) by mouth daily. Potassium supplement       PROBIOTIC Caps   Take 1 capsule by mouth daily. For intestinal health.       promethazine 25 MG tablet   Commonly known as: PHENERGAN    Take 25 mg by mouth every 6 (six) hours as needed. nausea       traMADol 50 MG tablet   Commonly known as: ULTRAM   Take 50 mg by mouth every 6 (six) hours as needed. For pain            Follow-up Information    Follow up with [facility] the [place] on 12/31/2011. (Appointment scheduled at 11:00 AM with [name], resume SAIOP)    Contact information:   [address] [place], [place] [address] [phone]         Follow-up recommendations:  Activity:  unrestricted Diet:  regular  Signed: Kendelle Schweers A 01/01/2012, 9:43 AM

## 2012-01-03 NOTE — Telephone Encounter (Signed)
Pt last seen 2.5.13 by TP for sick visit, no showed for 2.20.13 follow up with [facility].  Refill request on promethazine DM cough syrup given at ov.  Requested Medications     promethazine-dextromethorphan (PROMETHAZINE-DM) 6.25-15 MG/5ML syrup [Pharmacy Med Name: PROMETHAZINE DM SYP]   The source prescription has been discontinued.   TAKE ONE TEASPOONFUL BY MOUTH 4 TIMES DAILY AS NEEDED   Disp: 120 mL Start: 12/31/2011  Class: Normal   Requested on: 10/02/2011   Originally ordered on: 10/02/2011  Last refill: 10/02/2011     Tammy please advise if pt may have refill, thanks.

## 2012-01-18 ENCOUNTER — Emergency Department (HOSPITAL_COMMUNITY)
Admission: EM | Admit: 2012-01-18 | Discharge: 2012-01-19 | Disposition: A | Discharge status: Other Acute Inpatient Hospital | Payer: Self-pay | Attending: Emergency Medicine | Admitting: Emergency Medicine

## 2012-01-18 ENCOUNTER — Encounter (HOSPITAL_COMMUNITY): Payer: Self-pay

## 2012-01-18 DIAGNOSIS — F3289 Other specified depressive episodes: Secondary | ICD-10-CM | POA: Insufficient documentation

## 2012-01-18 DIAGNOSIS — F411 Generalized anxiety disorder: Secondary | ICD-10-CM | POA: Insufficient documentation

## 2012-01-18 DIAGNOSIS — M1990 Unspecified osteoarthritis, unspecified site: Secondary | ICD-10-CM | POA: Insufficient documentation

## 2012-01-18 DIAGNOSIS — F329 Major depressive disorder, single episode, unspecified: Secondary | ICD-10-CM | POA: Insufficient documentation

## 2012-01-18 DIAGNOSIS — Z79899 Other long term (current) drug therapy: Secondary | ICD-10-CM | POA: Insufficient documentation

## 2012-01-18 DIAGNOSIS — I10 Essential (primary) hypertension: Secondary | ICD-10-CM | POA: Insufficient documentation

## 2012-01-18 DIAGNOSIS — F1010 Alcohol abuse, uncomplicated: Secondary | ICD-10-CM | POA: Insufficient documentation

## 2012-01-18 LAB — URINALYSIS, ROUTINE W REFLEX MICROSCOPIC
Leukocytes, UA: NEGATIVE
Nitrite: NEGATIVE
Protein, ur: NEGATIVE mg/dL
Specific Gravity, Urine: 1.032 — ABNORMAL HIGH (ref 1.005–1.030)
Urobilinogen, UA: 1 mg/dL (ref 0.0–1.0)

## 2012-01-18 LAB — COMPREHENSIVE METABOLIC PANEL
AST: 113 U/L — ABNORMAL HIGH (ref 0–37)
Albumin: 4.6 g/dL (ref 3.5–5.2)
Alkaline Phosphatase: 109 U/L (ref 39–117)
BUN: 26 mg/dL — ABNORMAL HIGH (ref 6–23)
CO2: 25 mEq/L (ref 19–32)
Chloride: 97 mEq/L (ref 96–112)
Creatinine, Ser: 1.35 mg/dL (ref 0.50–1.35)
GFR calc non Af Amer: 62 mL/min — ABNORMAL LOW (ref >90)
Potassium: 3.3 mEq/L — ABNORMAL LOW (ref 3.5–5.1)
Total Bilirubin: 0.4 mg/dL (ref 0.3–1.2)

## 2012-01-18 LAB — CBC
HCT: 41.5 % (ref 39.0–52.0)
MCV: 100.7 fL — ABNORMAL HIGH (ref 78.0–100.0)
RBC: 4.12 MIL/uL — ABNORMAL LOW (ref 4.22–5.81)
RDW: 14.4 % (ref 11.5–15.5)
WBC: 3.3 10*3/uL — ABNORMAL LOW (ref 4.0–10.5)

## 2012-01-18 LAB — RAPID URINE DRUG SCREEN, HOSP PERFORMED
Amphetamines: NOT DETECTED
Opiates: NOT DETECTED
Tetrahydrocannabinol: POSITIVE — AB

## 2012-01-18 MED ORDER — THIAMINE HCL 100 MG/ML IJ SOLN: 100.0000 mg | Freq: Once | INTRAMUSCULAR | Status: DC

## 2012-01-18 MED ORDER — ONDANSETRON 4 MG PO TBDP
4.0000 mg | ORAL_TABLET | Freq: Four times a day (QID) | ORAL | Status: DC | PRN
  Administered 2012-01-18 – 2012-01-19 (×2): 4 mg via ORAL
  Filled 2012-01-18 (×2): qty 1

## 2012-01-18 MED ORDER — LOPERAMIDE HCL 2 MG PO CAPS: 2.0000 mg | ORAL_CAPSULE | ORAL | Status: DC | PRN

## 2012-01-18 MED ORDER — HYDROXYZINE HCL 25 MG PO TABS
25.0000 mg | ORAL_TABLET | Freq: Four times a day (QID) | ORAL | Status: DC | PRN
  Administered 2012-01-18: 25 mg via ORAL
  Filled 2012-01-18: qty 1

## 2012-01-18 MED ORDER — CHLORDIAZEPOXIDE HCL 25 MG PO CAPS
25.0000 mg | ORAL_CAPSULE | Freq: Four times a day (QID) | ORAL | Status: DC | PRN
  Administered 2012-01-18 – 2012-01-19 (×5): 25 mg via ORAL
  Filled 2012-01-18 (×5): qty 1

## 2012-01-18 MED ORDER — ADULT MULTIVITAMIN W/MINERALS CH
1.0000 | ORAL_TABLET | Freq: Every day | ORAL | Status: DC
  Administered 2012-01-18 – 2012-01-19 (×2): 1 via ORAL

## 2012-01-18 MED ORDER — SODIUM CHLORIDE 0.9 % IV BOLUS (SEPSIS)
1000.0000 mL | Freq: Once | INTRAVENOUS | Status: AC
  Administered 2012-01-18: 1000 mL via INTRAVENOUS

## 2012-01-18 MED ORDER — IBUPROFEN 600 MG PO TABS
600.0000 mg | ORAL_TABLET | Freq: Three times a day (TID) | ORAL | Status: DC | PRN
  Administered 2012-01-18 – 2012-01-19 (×2): 600 mg via ORAL
  Filled 2012-01-18 (×2): qty 1

## 2012-01-18 MED ORDER — VITAMIN B-1 100 MG PO TABS
100.0000 mg | ORAL_TABLET | Freq: Every day | ORAL | Status: DC
  Administered 2012-01-19: 100 mg via ORAL
  Filled 2012-01-18 (×2): qty 1

## 2012-01-18 MED ORDER — ONDANSETRON HCL 4 MG PO TABS: 4.0000 mg | ORAL_TABLET | Freq: Three times a day (TID) | ORAL | Status: DC | PRN

## 2012-01-18 NOTE — ED Notes (Signed)
Patient and belongings have been wanded by security. 2 bags located behind triage nursing station. Patient changed into blue scrubs and red socks.

## 2012-01-18 NOTE — BH Assessment (Signed)
Assessment Note   Martin Singh is an 46 y.o. male. PT PRESENTED INTOXICATED AFTER DRINKING 1 PINT OF LIQUOR LAST NIGHT. PT SAYS HE CALLED HIS THERAPIST WHO RECOMMENDED HE COME TO [facility] FOR MEDICAL CLEAR THEN POSSIBLE BE SENT TO [facility]. PT SAYS HE HAS BEEN DRINKING SINCE AGE 15 & HAD TRIED ON HIS OWN TO STOP BUT UNABLE TO DO SO. PT DENIES ANY IDEATION & IS ABLE TO CONTRACT FOR SAFETY. PT ADMITS TO SOME WITHDRAWAL SYMPTOMS INCLUDING TREMORS, NAUSEA, VOMITING, ANXIETY & SMALL SEIZURE ACTIVITY WHICH HE SAID HAPPENED THIS AM. PT SAYS HIS SEIZURES HAPPEN WHEN HE TRIES TO STOP DRINKING. PT WAS REFERRED TO [facility] WHOM DECLINED 7 HAS NOW BEEN REFERRED TO [facility] FOR DISPOSITION.  Axis I: Anxiety Disorder NOS and ALCOHOL DEPENDENCE Axis II: Deferred Axis III:  Past Medical History  Diagnosis Date  . Hypertension   . Depressive disorder, not elsewhere classified   . Osteoarthrosis, unspecified whether generalized or localized, unspecified site   . Irritable bowel syndrome   . Esophageal reflux   . Colon polyps     hyperplastic/  . Allergic rhinitis   . Pancreatitis   . History of pneumonia   . Insomnia   . Anxiety   . Diverticulitis   . Alcohol abuse 2012   Axis IV: occupational problems Axis V: 41-50 serious symptoms  Past Medical History:  Past Medical History  Diagnosis Date  . Hypertension   . Depressive disorder, not elsewhere classified   . Osteoarthrosis, unspecified whether generalized or localized, unspecified site   . Irritable bowel syndrome   . Esophageal reflux   . Colon polyps     hyperplastic/  . Allergic rhinitis   . Pancreatitis   . History of pneumonia   . Insomnia   . Anxiety   . Diverticulitis   . Alcohol abuse 2012    Past Surgical History  Procedure Date  . Tibula surgery     right  . Fibula fracture surgery     right    Family History:  Family History  Problem Relation Age of Onset  . Heart disease Mother   . Gallbladder disease Mother   .  Nephrolithiasis Mother   . Colon cancer Neg Hx   . Hypertension Father     moth  . Arthritis Other   . Hyperlipidemia Other   . Stroke Other     Social History:  reports that he quit smoking about 20 years ago. His smoking use included Cigarettes. He has a 27 pack-year smoking history. His smokeless tobacco use includes Chew. He reports that he drinks alcohol. He reports that he does not use illicit drugs.  Additional Social History:  Alcohol / Drug Use History of alcohol / drug use?: Yes Substance #1 Name of Substance 1: ETOH 1 - Age of First Use: 11 1 - Amount (size/oz): 1 PINT  1 - Frequency: DAILY 1 - Duration: ON GOING 1 - Last Use / Amount: 01/17/12  CIWA: CIWA-Ar BP: 127/81 mmHg Pulse Rate: 82  Nausea and Vomiting: intermittent nausea with dry heaves Tactile Disturbances: none Tremor: six Auditory Disturbances: not present Paroxysmal Sweats: beads of sweat obvious on forehead Visual Disturbances: not present Anxiety: six Headache, Fullness in Head: severe Agitation: normal activity Orientation and Clouding of Sensorium: oriented and can do serial additions CIWA-Ar Total: 25  COWS:    Allergies:  Allergies  Allergen Reactions  . Codeine Anaphylaxis  . Vicodin (Hydrocodone-Acetaminophen) Anaphylaxis  . Benazepril Cough  .  Corticosteroids Other (See Comments)    Shaky and high blood pressure  . Lorazepam Other (See Comments)    Becomes violent and hallucinates  . Prednisone Other (See Comments)    High blood pressure and shaky     Home Medications:  (Not in a hospital admission)  OB/GYN Status:  No LMP for male patient.  General Assessment Data Location of Assessment: [facility] ACT Assessment: Yes Living Arrangements: Spouse/significant other Can pt return to current living arrangement?: Yes Admission Status: Voluntary Is patient capable of signing voluntary admission?: Yes Transfer from: Acute Hospital Referral Source: Other (THERAPIST)     Risk to  self Suicidal Ideation: No Suicidal Intent: No Is patient at risk for suicide?: No Suicidal Plan?: No Access to Means: No What has been your use of drugs/alcohol within the last 12 months?: PT ADMITS TO ETOH USE SINCE AGE 21 & DINKS 1 PINT DAILY & LAST NIGHT 01/17/12 Previous Attempts/Gestures: No How many times?: 0  Other Self Harm Risks: NA Triggers for Past Attempts: Unknown Intentional Self Injurious Behavior: None Family Suicide History: No Recent stressful life event(s): Job Loss;Financial Problems;Turmoil (Comment) Persecutory voices/beliefs?: No Depression: Yes Depression Symptoms: Loss of interest in usual pleasures;Feeling worthless/self pity;Fatigue;Isolating Substance abuse history and/or treatment for substance abuse?: Yes Suicide prevention information given to non-admitted patients: Not applicable  Risk to Others Homicidal Ideation: No Thoughts of Harm to Others: No Current Homicidal Intent: No Current Homicidal Plan: No Access to Homicidal Means: No Identified Victim: NA History of harm to others?: No Assessment of Violence: None Noted Violent Behavior Description: CALM, COOPERATIVE Does patient have access to weapons?: No Criminal Charges Pending?: No Does patient have a court date: No  Psychosis Hallucinations: None noted Delusions: None noted  Mental Status Report Appear/Hygiene: Body odor Eye Contact: Good Motor Activity: Freedom of movement Speech: Logical/coherent Level of Consciousness: Alert Mood: Depressed;Anhedonia Affect: Appropriate to circumstance;Sad;Depressed Anxiety Level: Minimal Thought Processes: Coherent;Relevant Judgement: Unimpaired Orientation: Person;Place;Time;Situation Obsessive Compulsive Thoughts/Behaviors: None  Cognitive Functioning Concentration: Decreased Memory: Recent Intact;Remote Intact IQ: Average Insight: Fair Impulse Control: Poor Appetite: Good Weight Loss: 0  Weight Gain: 0  Sleep: Decreased Total  Hours of Sleep: 4  Vegetative Symptoms: None  ADLScreening [facility]) Patient's cognitive ability adequate to safely complete daily activities?: Yes Patient able to express need for assistance with ADLs?: Yes Independently performs ADLs?: Yes  Abuse/Neglect [facility]) Physical Abuse: Denies Verbal Abuse: Denies Sexual Abuse: Denies  Prior Inpatient Therapy Prior Inpatient Therapy: Yes Prior Therapy Dates: 2001, 2013 Prior Therapy Facilty/Provider(s): [facility] Reason for Treatment: DETOX, REHAB  Prior Outpatient Therapy Prior Outpatient Therapy: Yes Prior Therapy Dates: CURRENT Prior Therapy Facilty/Provider(s): [facility](COUNSELOR); CANT REMEMBER PSYCHIATRIST NAME Reason for Treatment: THERAPY & MED MANAGEMENT  ADL Screening (condition at time of admission) Patient's cognitive ability adequate to safely complete daily activities?: Yes Patient able to express need for assistance with ADLs?: Yes Independently performs ADLs?: Yes       Abuse/Neglect Assessment (Assessment to be complete while patient is alone) Physical Abuse: Denies Verbal Abuse: Denies Sexual Abuse: Denies Values / Beliefs Cultural Requests During Hospitalization: None Spiritual Requests During Hospitalization: None        Additional Information 1:1 In Past 12 Months?: No CIRT Risk: No Elopement Risk: No Does patient have medical clearance?: Yes     Disposition:  Disposition Disposition of Patient: Inpatient treatment program;Referred to ([facility]END[facility]) Type of inpatient treatment program: Adult  On Site  Evaluation by:   Reviewed with Physician:     Waldron Session 01/18/2012 8:04 PM

## 2012-01-18 NOTE — ED Provider Notes (Signed)
History     CSN: [id number]  Arrival date & time 01/18/12  1428   First MD Initiated Contact with Patient 01/18/12 1539      Chief Complaint  Patient presents with  . Medical Clearance    ETOH    (Consider location/radiation/quality/duration/timing/severity/associated sxs/prior treatment) The history is provided by the patient.   patient is requesting detox from alcohol. History of same. He states that he drinks a pint of brandy a day. He states he starts to shake he doesn't drink in the morning. No suicidal or homicidal  Thoughts. He denies other drug use. He was seen at behavioral health a month ago for similar symptoms, except at that time he was depressed. He states that he is contacted [facility] about placement. Abdominal pain. No vomiting. He last drank last night.  Past Medical History  Diagnosis Date  . Hypertension   . Depressive disorder, not elsewhere classified   . Osteoarthrosis, unspecified whether generalized or localized, unspecified site   . Irritable bowel syndrome   . Esophageal reflux   . Colon polyps     hyperplastic/  . Allergic rhinitis   . Pancreatitis   . History of pneumonia   . Insomnia   . Anxiety   . Diverticulitis   . Alcohol abuse 2012    Past Surgical History  Procedure Date  . Tibula surgery     right  . Fibula fracture surgery     right    Family History  Problem Relation Age of Onset  . Heart disease Mother   . Gallbladder disease Mother   . Nephrolithiasis Mother   . Colon cancer Neg Hx   . Hypertension Father     moth  . Arthritis Other   . Hyperlipidemia Other   . Stroke Other     History  Substance Use Topics  . Smoking status: Former Smoker -- 1.5 packs/day for 18 years    Types: Cigarettes    Quit date: 08/28/1991  . Smokeless tobacco: Current User    Types: Chew  . Alcohol Use: Yes     everyday 1/2 pint daily      Review of Systems  Constitutional: Negative for activity change and appetite change.  HENT:  Negative for neck stiffness.   Eyes: Negative for pain.  Respiratory: Negative for chest tightness and shortness of breath.   Cardiovascular: Negative for chest pain and leg swelling.  Gastrointestinal: Negative for nausea, vomiting, abdominal pain and diarrhea.  Genitourinary: Negative for flank pain.  Musculoskeletal: Negative for back pain.  Skin: Negative for rash.  Neurological: Negative for weakness, numbness and headaches.  Psychiatric/Behavioral: Negative for behavioral problems, sleep disturbance and dysphoric mood.    Allergies  Codeine; Vicodin; Benazepril; Corticosteroids; Lorazepam; and Prednisone  Home Medications   Current Outpatient Rx  Name Route Sig Dispense Refill  . ATENOLOL 50 MG PO TABS Oral Take 50 mg by mouth daily.    Marland Kitchen FLUOXETINE HCL 20 MG PO CAPS Oral Take 40 mg by mouth daily.    Marland Kitchen HYDROCHLOROTHIAZIDE 12.5 MG PO CAPS Oral Take 1 capsule (12.5 mg total) by mouth daily. For blood pressure/fluid 30 capsule 0  . HYDROXYZINE PAMOATE 50 MG PO CAPS Oral Take 50 mg by mouth 3 (three) times daily as needed. For anxiety.    . ADULT MULTIVITAMIN W/MINERALS CH Oral Take 1 tablet by mouth daily.    Marland Kitchen OMEPRAZOLE 40 MG PO CPDR  Take 1 capsule in the morning and 1 capsule in the  evening for Acid Reflux. 60 capsule 3  . PROBIOTIC PO CAPS Oral Take 1 capsule by mouth daily. For intestinal health.    . TRAMADOL HCL 50 MG PO TABS Oral Take 50 mg by mouth every 6 (six) hours as needed. For pain      BP 127/68  Pulse 81  Temp(Src) 98.6 F (37 C) (Oral)  Resp 18  SpO2 95%  Physical Exam  Nursing note and vitals reviewed. Constitutional: He is oriented to person, place, and time. He appears well-developed and well-nourished.  HENT:  Head: Normocephalic and atraumatic.  Eyes: EOM are normal. Pupils are equal, round, and reactive to light.  Neck: Normal range of motion. Neck supple.  Cardiovascular: Normal rate, regular rhythm and normal heart sounds.   No murmur  heard. Pulmonary/Chest: Effort normal and breath sounds normal.  Abdominal: Soft. Bowel sounds are normal. He exhibits no distension and no mass. There is no tenderness. There is no rebound and no guarding.  Musculoskeletal: Normal range of motion. He exhibits no edema.  Neurological: He is alert and oriented to person, place, and time. No cranial nerve deficit.  Skin: Skin is warm and dry.  Psychiatric: He has a normal mood and affect.    ED Course  Procedures (including critical care time)  Labs Reviewed  CBC - Abnormal; Notable for the following:    WBC 3.3 (*)    RBC 4.12 (*)    MCV 100.7 (*)    All other components within normal limits  COMPREHENSIVE METABOLIC PANEL - Abnormal; Notable for the following:    Potassium 3.3 (*)    Glucose, Bld 247 (*)    BUN 26 (*)    AST 113 (*)    ALT 117 (*)    GFR calc non Af Amer 62 (*)    GFR calc Af Amer 71 (*)    All other components within normal limits  ETHANOL - Abnormal; Notable for the following:    Alcohol, Ethyl (B) 272 (*)    All other components within normal limits  URINE RAPID DRUG SCREEN (HOSP PERFORMED) - Abnormal; Notable for the following:    Benzodiazepines POSITIVE (*)    Tetrahydrocannabinol POSITIVE (*)    All other components within normal limits  URINALYSIS, ROUTINE W REFLEX MICROSCOPIC - Abnormal; Notable for the following:    Color, Urine AMBER (*) BIOCHEMICALS MAY BE AFFECTED BY COLOR   Specific Gravity, Urine 1.032 (*)    Ketones, ur TRACE (*)    All other components within normal limits  GLUCOSE, CAPILLARY - Abnormal; Notable for the following:    Glucose-Capillary 122 (*)    All other components within normal limits   No results found.   1. Alcohol abuse       MDM  Patient is requesting detox off alcohol. He last used last night. He states he has a history of shaking with his detox. He was last seen a month ago. He appears to medically cleared at this time. His been started on a Librium protocol  and will be seen by ACT team    Juliet Rude. Rubin Payor, MD 01/18/12 2237

## 2012-01-18 NOTE — ED Notes (Signed)
Pt requesting detox from etoh, states drinks a pint a day, denies SI/HI

## 2012-01-18 NOTE — ED Notes (Signed)
Pt. Took Thiamine po at 1738.

## 2012-01-19 ENCOUNTER — Encounter (HOSPITAL_COMMUNITY): Payer: Self-pay | Admitting: *Deleted

## 2012-01-19 ENCOUNTER — Inpatient Hospital Stay (HOSPITAL_COMMUNITY)
Admission: RE | Admit: 2012-01-19 | Discharge: 2012-01-23 | DRG: 897 | Disposition: A | Discharge status: Home / Self Care | Payer: Federal, State, Local not specified - Other | Source: Ambulatory Visit | Attending: Psychiatry | Admitting: Psychiatry

## 2012-01-19 DIAGNOSIS — K589 Irritable bowel syndrome without diarrhea: Secondary | ICD-10-CM | POA: Diagnosis present

## 2012-01-19 DIAGNOSIS — F8189 Other developmental disorders of scholastic skills: Secondary | ICD-10-CM | POA: Diagnosis present

## 2012-01-19 DIAGNOSIS — F10239 Alcohol dependence with withdrawal, unspecified: Secondary | ICD-10-CM | POA: Diagnosis present

## 2012-01-19 DIAGNOSIS — F17200 Nicotine dependence, unspecified, uncomplicated: Secondary | ICD-10-CM | POA: Diagnosis present

## 2012-01-19 DIAGNOSIS — K5730 Diverticulosis of large intestine without perforation or abscess without bleeding: Secondary | ICD-10-CM | POA: Diagnosis present

## 2012-01-19 DIAGNOSIS — F10988 Alcohol use, unspecified with other alcohol-induced disorder: Secondary | ICD-10-CM | POA: Diagnosis present

## 2012-01-19 DIAGNOSIS — F10939 Alcohol use, unspecified with withdrawal, unspecified: Secondary | ICD-10-CM | POA: Diagnosis present

## 2012-01-19 DIAGNOSIS — K219 Gastro-esophageal reflux disease without esophagitis: Secondary | ICD-10-CM | POA: Diagnosis present

## 2012-01-19 DIAGNOSIS — R05 Cough: Secondary | ICD-10-CM

## 2012-01-19 DIAGNOSIS — M1990 Unspecified osteoarthritis, unspecified site: Secondary | ICD-10-CM | POA: Diagnosis present

## 2012-01-19 DIAGNOSIS — R51 Headache: Secondary | ICD-10-CM | POA: Diagnosis not present

## 2012-01-19 DIAGNOSIS — F1020 Alcohol dependence, uncomplicated: Principal | ICD-10-CM | POA: Diagnosis present

## 2012-01-19 DIAGNOSIS — Z860100 Personal history of colon polyps, unspecified: Secondary | ICD-10-CM

## 2012-01-19 DIAGNOSIS — I10 Essential (primary) hypertension: Secondary | ICD-10-CM | POA: Diagnosis present

## 2012-01-19 DIAGNOSIS — F1210 Cannabis abuse, uncomplicated: Secondary | ICD-10-CM | POA: Diagnosis present

## 2012-01-19 DIAGNOSIS — Z79899 Other long term (current) drug therapy: Secondary | ICD-10-CM

## 2012-01-19 MED ORDER — RISAQUAD PO CAPS
1.0000 | ORAL_CAPSULE | Freq: Every day | ORAL | Status: DC
  Administered 2012-01-20 – 2012-01-23 (×4): 1 via ORAL
  Filled 2012-01-19 (×5): qty 1

## 2012-01-19 MED ORDER — HYDROXYZINE HCL 50 MG PO TABS
50.0000 mg | ORAL_TABLET | Freq: Three times a day (TID) | ORAL | Status: DC | PRN
  Administered 2012-01-19 – 2012-01-21 (×3): 50 mg via ORAL

## 2012-01-19 MED ORDER — CHLORDIAZEPOXIDE HCL 25 MG PO CAPS
25.0000 mg | ORAL_CAPSULE | Freq: Four times a day (QID) | ORAL | Status: AC
  Administered 2012-01-19 – 2012-01-21 (×6): 25 mg via ORAL
  Filled 2012-01-19 (×6): qty 1

## 2012-01-19 MED ORDER — PANTOPRAZOLE SODIUM 40 MG PO TBEC
80.0000 mg | DELAYED_RELEASE_TABLET | Freq: Every day | ORAL | Status: DC
  Administered 2012-01-19 – 2012-01-23 (×5): 80 mg via ORAL
  Filled 2012-01-19 (×6): qty 2

## 2012-01-19 MED ORDER — ONDANSETRON 4 MG PO TBDP: 4.0000 mg | ORAL_TABLET | Freq: Four times a day (QID) | ORAL | Status: AC | PRN

## 2012-01-19 MED ORDER — CHLORDIAZEPOXIDE HCL 25 MG PO CAPS
25.0000 mg | ORAL_CAPSULE | Freq: Four times a day (QID) | ORAL | Status: AC | PRN
  Administered 2012-01-20 – 2012-01-21 (×4): 25 mg via ORAL
  Filled 2012-01-19 (×4): qty 1

## 2012-01-19 MED ORDER — CHLORDIAZEPOXIDE HCL 25 MG PO CAPS
50.0000 mg | ORAL_CAPSULE | Freq: Once | ORAL | Status: AC
  Administered 2012-01-19: 50 mg via ORAL
  Filled 2012-01-19: qty 2

## 2012-01-19 MED ORDER — CHLORDIAZEPOXIDE HCL 25 MG PO CAPS
25.0000 mg | ORAL_CAPSULE | ORAL | Status: AC
  Administered 2012-01-22 – 2012-01-23 (×2): 25 mg via ORAL
  Filled 2012-01-19 (×2): qty 1

## 2012-01-19 MED ORDER — MAGNESIUM HYDROXIDE 400 MG/5ML PO SUSP: 30.0000 mL | Freq: Every day | ORAL | Status: DC | PRN

## 2012-01-19 MED ORDER — CHLORDIAZEPOXIDE HCL 25 MG PO CAPS: 25.0000 mg | ORAL_CAPSULE | Freq: Every day | ORAL | Status: DC

## 2012-01-19 MED ORDER — TRAMADOL HCL 50 MG PO TABS
50.0000 mg | ORAL_TABLET | Freq: Four times a day (QID) | ORAL | Status: DC | PRN
  Administered 2012-01-20 – 2012-01-23 (×7): 50 mg via ORAL
  Filled 2012-01-19 (×7): qty 1

## 2012-01-19 MED ORDER — VITAMIN B-1 100 MG PO TABS
100.0000 mg | ORAL_TABLET | Freq: Every day | ORAL | Status: DC
  Administered 2012-01-20 – 2012-01-23 (×4): 100 mg via ORAL
  Filled 2012-01-19 (×5): qty 1

## 2012-01-19 MED ORDER — HYDROCHLOROTHIAZIDE 12.5 MG PO CAPS
12.5000 mg | ORAL_CAPSULE | Freq: Once | ORAL | Status: AC
  Administered 2012-01-19: 12.5 mg via ORAL
  Filled 2012-01-19: qty 1

## 2012-01-19 MED ORDER — HYDROCHLOROTHIAZIDE 12.5 MG PO CAPS
12.5000 mg | ORAL_CAPSULE | Freq: Every day | ORAL | Status: DC
  Administered 2012-01-20 – 2012-01-22 (×3): 12.5 mg via ORAL
  Filled 2012-01-19 (×5): qty 1

## 2012-01-19 MED ORDER — HYDROXYZINE HCL 25 MG PO TABS
25.0000 mg | ORAL_TABLET | Freq: Four times a day (QID) | ORAL | Status: DC | PRN
  Administered 2012-01-22: 25 mg via ORAL

## 2012-01-19 MED ORDER — FLUOXETINE HCL 20 MG PO CAPS
40.0000 mg | ORAL_CAPSULE | Freq: Every day | ORAL | Status: DC
  Administered 2012-01-20 – 2012-01-22 (×3): 40 mg via ORAL
  Filled 2012-01-19 (×5): qty 2

## 2012-01-19 MED ORDER — ALUM & MAG HYDROXIDE-SIMETH 200-200-20 MG/5ML PO SUSP: 30.0000 mL | ORAL | Status: DC | PRN

## 2012-01-19 MED ORDER — ADULT MULTIVITAMIN W/MINERALS CH
1.0000 | ORAL_TABLET | Freq: Every day | ORAL | Status: DC
  Administered 2012-01-19 – 2012-01-23 (×5): 1 via ORAL
  Filled 2012-01-19 (×6): qty 1

## 2012-01-19 MED ORDER — LOPERAMIDE HCL 2 MG PO CAPS: 2.0000 mg | ORAL_CAPSULE | ORAL | Status: AC | PRN

## 2012-01-19 MED ORDER — ATENOLOL 50 MG PO TABS
50.0000 mg | ORAL_TABLET | Freq: Every day | ORAL | Status: DC
  Administered 2012-01-20 – 2012-01-22 (×3): 50 mg via ORAL
  Filled 2012-01-19 (×5): qty 1

## 2012-01-19 MED ORDER — TRAMADOL HCL 50 MG PO TABS: 50.0000 mg | ORAL_TABLET | Freq: Four times a day (QID) | ORAL | Status: DC

## 2012-01-19 MED ORDER — CHLORDIAZEPOXIDE HCL 25 MG PO CAPS
25.0000 mg | ORAL_CAPSULE | Freq: Three times a day (TID) | ORAL | Status: AC
  Administered 2012-01-21 – 2012-01-22 (×3): 25 mg via ORAL
  Filled 2012-01-19 (×3): qty 1

## 2012-01-19 MED ORDER — THIAMINE HCL 100 MG/ML IJ SOLN
100.0000 mg | Freq: Once | INTRAMUSCULAR | Status: DC
Start: 2012-01-19 — End: 2012-01-23

## 2012-01-19 MED ORDER — CHLORDIAZEPOXIDE HCL 25 MG PO CAPS
25.0000 mg | ORAL_CAPSULE | Freq: Once | ORAL | Status: AC
  Administered 2012-01-19: 25 mg via ORAL
  Filled 2012-01-19: qty 1

## 2012-01-19 MED ORDER — ACETAMINOPHEN 325 MG PO TABS
650.0000 mg | ORAL_TABLET | Freq: Four times a day (QID) | ORAL | Status: DC | PRN
  Administered 2012-01-20: 650 mg via ORAL

## 2012-01-19 MED ORDER — NEBIVOLOL HCL 5 MG PO TABS
5.0000 mg | ORAL_TABLET | Freq: Once | ORAL | Status: AC
  Administered 2012-01-19: 5 mg via ORAL
  Filled 2012-01-19: qty 1

## 2012-01-19 MED ORDER — POTASSIUM CHLORIDE CRYS ER 20 MEQ PO TBCR
40.0000 meq | EXTENDED_RELEASE_TABLET | Freq: Once | ORAL | Status: AC
  Administered 2012-01-19: 40 meq via ORAL
  Filled 2012-01-19: qty 2

## 2012-01-19 NOTE — ED Notes (Signed)
ACT into see 

## 2012-01-19 NOTE — ED Provider Notes (Addendum)
Pt with etoh abuse. Is resting quietly. No tremor or shakes. Discussed w nursing, no seizures since in ed. Vitals normal. Discussed w act team.  Suzi Roots, MD 01/19/12 [id number]   Act team indicates pt accepted to [facility] by Dr Shela Commons for Dr Koren Shiver, bed ready.   Suzi Roots, MD 01/19/12 1046

## 2012-01-19 NOTE — ED Notes (Signed)
Up to the bathroom 

## 2012-01-19 NOTE — ED Notes (Signed)
Up to the bathroom, [facility] will call back for report

## 2012-01-19 NOTE — Tx Team (Signed)
Initial Interdisciplinary Treatment Plan  PATIENT STRENGTHS: (choose at least two) Ability for insight Motivation for treatment/growth Supportive family/friends  PATIENT STRESSORS: Substance abuse   PROBLEM LIST: Problem List/Patient Goals Date to be addressed Date deferred Reason deferred Estimated date of resolution  etoh detox  01-19-12                                                      DISCHARGE CRITERIA:  Improved stabilization in mood, thinking, and/or behavior Withdrawal symptoms are absent or subacute and managed without 24-hour nursing intervention  PRELIMINARY DISCHARGE PLAN: Attend aftercare/continuing care group Attend 12-step recovery group Return to previous living arrangement  PATIENT/FAMIILY INVOLVEMENT: This treatment plan has been presented to and reviewed with the patient, Martin Singh, and/or family member, .  The patient and family have been given the opportunity to ask questions and make suggestions.  Valente David 01/19/2012, 7:14 PM

## 2012-01-19 NOTE — ED Notes (Signed)
Dr Denton Lank updated-give 1030 dose of librium

## 2012-01-19 NOTE — ED Notes (Signed)
Transport approx 1515 per [employer]

## 2012-01-19 NOTE — ED Notes (Signed)
1 pt belongings bag and duffle bag sent w/ pt

## 2012-01-19 NOTE — ED Notes (Signed)
Pt transferred to [facility] via security w/ RN

## 2012-01-19 NOTE — ED Notes (Signed)
Feeling better, tremors still present, but have improved was able to eat most of his lunch.  Pt is aware that he will be transferred to [name] shortly.

## 2012-01-19 NOTE — B[facility] Counselor (Signed)
Martin Singh, assessment counselor at [facility], submitted Pt for admission to [facility]. Consulted with Rosey Bath, [facility] who confirmed bed availability. Gave clinical report to Dr. Mervyn Gay who declined to admit to [facility] at this time and recommended Pt be observed in the emergency department due to seizure activity and withdrawal symptoms. Communicated this information to Georgina Quint, assessment counselor at [employer].  Shela Commons, [facility]

## 2012-01-19 NOTE — Progress Notes (Signed)
Pt was just admitted today with detox from alcohol, still experiencing heavy withdrawal with visible tremors.  PT did attend group this evening, interacting appropriately on unit.  Pt spoke to his fiance and would like her to be able to get his medical information should she call and ask.  Pt denies SI/HI/hallucinations at this time.  Support and encouragement offered, will continue to monitor.

## 2012-01-19 NOTE — ED Notes (Signed)
De steinl into see

## 2012-01-19 NOTE — ED Notes (Signed)
[facility] will call back for report 

## 2012-01-19 NOTE — Progress Notes (Signed)
Patient ID: Martin Singh, male   DOB: 01-18-1966, 46 y.o.   MRN: [id number] 01-19-12 @ 1913 nursing adm note: pt came to [facility] voluntarily wanting detox from etoh. He was at [facility] about 1 month ago. He states he has been drinking 1/2 to 1 pint of liquor daily and his last drink was 01-18-12. He also stated he uses mj and had a uds positive for benzos only. He denied any si/hi/av. He has several allergies that have been confirmed in the system. He has a general body ache he rated at 9/10. He chews tobacco and has a medical hx of htn, ibs, pancreatitis, insomnia, arthritis, gerd and has had a seizure. His last sz was 01-18-12 and is a fall risk. He stated his seizure occur during withdrawal from etoh.  Fall precautions have been taken and he was hypertensive on admission. He stated he wears glasses for reading but didn't bring them. He also has a hx of a right leg tibia and fibia fracture. His labs are; elevated liver enzymes, k was 3.3 and wbc at 3.3. He was very polite on adm and escorted to the 300 hall. Report was given to liz,rn.  Contact person: anne chase at ph # [phone]

## 2012-01-20 DIAGNOSIS — F1020 Alcohol dependence, uncomplicated: Secondary | ICD-10-CM | POA: Diagnosis present

## 2012-01-20 DIAGNOSIS — F10988 Alcohol use, unspecified with other alcohol-induced disorder: Secondary | ICD-10-CM

## 2012-01-20 MED ORDER — PANTOPRAZOLE SODIUM 40 MG PO TBEC: 40.0000 mg | DELAYED_RELEASE_TABLET | Freq: Every day | ORAL | Status: DC

## 2012-01-20 MED ORDER — HYDROXYZINE PAMOATE 25 MG PO CAPS: 50.0000 mg | ORAL_CAPSULE | Freq: Three times a day (TID) | ORAL | Status: DC | PRN

## 2012-01-20 MED ORDER — NICOTINE 21 MG/24HR TD PT24
MEDICATED_PATCH | TRANSDERMAL | Status: AC
  Administered 2012-01-20: 09:00:00
  Filled 2012-01-20: qty 1

## 2012-01-20 MED ORDER — ATENOLOL 50 MG PO TABS: 50.0000 mg | ORAL_TABLET | Freq: Every day | ORAL | Status: DC

## 2012-01-20 MED ORDER — ADULT MULTIVITAMIN W/MINERALS CH: 1.0000 | ORAL_TABLET | Freq: Every day | ORAL | Status: DC

## 2012-01-20 MED ORDER — HYDROCHLOROTHIAZIDE 12.5 MG PO CAPS: 12.5000 mg | ORAL_CAPSULE | Freq: Every day | ORAL | Status: DC

## 2012-01-20 MED ORDER — FLUOXETINE HCL 20 MG PO CAPS: 40.0000 mg | ORAL_CAPSULE | Freq: Every day | ORAL | Status: DC

## 2012-01-20 NOTE — [facility] Counselor (Signed)
Adult Comprehensive Assessment  Patient ID: Martin Singh, male   DOB: 08/29/1965, 46 y.o.   MRN: [id number]  Information Source: Information source: Patient  Current Stressors:  Educational / Learning stressors: N/A Employment / Job issues: Pt. is unemployed  Family Relationships: Finalizing divorce  Surveyor, quantity / Lack of resources (include bankruptcy): Unemployed, no insurance  Housing / Lack of housing: N/A Physical health (include injuries & life threatening diseases): N/A  Social relationships: N/A Substance abuse: Alcohol abuse  Bereavement / Loss: N/A   Living/Environment/Situation:  Living Arrangements: Spouse/significant other Living conditions (as described by patient or guardian): Pt. lives with fiance  How long has patient lived in current situation?: 2 years  What is atmosphere in current home: Comfortable;Supportive  Family History:  Marital status: Separated Separated, when?: 2008 What types of issues is patient dealing with in the relationship?: Pt. is still tyring to finalize divorce from wife but reported no issues with current fiance  Additional relationship information: N/A  Does patient have children?: Yes How many children?: 1  How is patient's relationship with their children?: Good, pt. splits custody of son equally with wife   Childhood History:  By whom was/is the patient raised?: Both parents Additional childhood history information: N/A  Description of patient's relationship with caregiver when they were a child: Good  Patient's description of current relationship with people who raised him/her: Good but parents live in [place]  Does patient have siblings?: Yes Number of Siblings: 3  Description of patient's current relationship with siblings: Good but siblings live in [place]  Did patient suffer any verbal/emotional/physical/sexual abuse as a child?: No Did patient suffer from severe childhood neglect?: No Has patient ever been sexually  abused/assaulted/raped as an adolescent or adult?: No Was the patient ever a victim of a crime or a disaster?: No Witnessed domestic violence?: No Has patient been effected by domestic violence as an adult?: No  Education:  Highest grade of school patient has completed: 12th grade  Currently a student?: No Learning disability?: Yes What learning problems does patient have?: Pt. stated that he is a "slow learner"   Employment/Work Situation:   Employment situation: Unemployed Patient's job has been impacted by current illness: No What is the longest time patient has a held a job?: 12 years  Where was the patient employed at that time?: [employer]  Has patient ever been in the [employer]?: No Has patient ever served in Buyer, retail?: No  Financial Resources:   Surveyor, quantity resources: Actor unemployment Does patient have a Lawyer or guardian?: No  Alcohol/Substance Abuse:   What has been your use of drugs/alcohol within the last 12 months?: Pt. reported drinking more than 1 pint of alcohol per day  If attempted suicide, did drugs/alcohol play a role in this?: No Alcohol/Substance Abuse Treatment Hx: Past Tx, Inpatient If yes, describe treatment: [facility] 2001 for 2 years and [facility] nearly 20 years ago  Has alcohol/substance abuse ever caused legal problems?: No  Social Support System:   Forensic psychologist System: Good Describe Community Support System: Pt. reported that he recieves support from his fiance and his church  Type of faith/religion: Ephriam Knuckles  How does patient's faith help to cope with current illness?: Pt. attends church and Designer, fashion/clothing:   Leisure and Hobbies: Museum/gallery exhibitions officer, camping, fishing, golf, and spending time with son   Strengths/Needs:   What things does the patient do well?: Woodwork and art  In what areas does patient struggle / problems  for patient: Staying sober and finding appropriate resources    Discharge Plan:   Does patient have access to transportation?: Yes (Fiance ) Will patient be returning to same living situation after discharge?: Yes Currently receiving community mental health services: Yes (From Whom) ([facility] [place] ) If no, would patient like referral for services when discharged?: Yes (What county?) [facility] ) Does patient have financial barriers related to discharge medications?: Yes Patient description of barriers related to discharge medications: [facility] [place] pays for some medications but not all; pt. does not have insurance   Summary/Recommendations:   Summary and Recommendations (to be completed by the evaluator): Recommendations for treatment include crisis stabilization, case management, medication management, psychoeducation to teach coping skills and group therapy.   Gevena Mart. 01/20/2012

## 2012-01-20 NOTE — Progress Notes (Signed)
Pt is out in milieu interacting with peers and attending groups. Reports poor sleep,low energy and poor ability to pay attention.  Diarrhea,chilling and agitation r/t etoh withdrawal and is compliant with detox protocol.  Rates depression and hopelessness at 9. Denies SI. PRN medication requested and received for leg discomfort. Support offered and 15' checks cont for safety.

## 2012-01-20 NOTE — [facility] Suicide Risk Assessment (Signed)
Suicide Risk Assessment  Admission Assessment     Demographic factors:  Assessment Details Time of Assessment: Admission Information Obtained From: Patient Current Mental Status:  Current Mental Status:  (denies si/hi/av) Loss Factors:  Loss Factors: Decline in physical health Historical Factors:  Historical Factors: Impulsivity Risk Reduction Factors:  Risk Reduction Factors: Responsible for children under 47 years of age;Sense of responsibility to family;Living with another person, especially a relative;Positive social support  CLINICAL FACTORS:   Depression:   Anhedonia Comorbid alcohol abuse/dependence Impulsivity Insomnia Alcohol/Substance Abuse/Dependencies  COGNITIVE FEATURES THAT CONTRIBUTE TO RISK:  Polarized thinking    SUICIDE RISK:   Minimal: No identifiable suicidal ideation.  Patients presenting with no risk factors but with morbid ruminations; may be classified as minimal risk based on the severity of the depressive symptoms  PLAN OF CARE: Patient was admitted to [facility] for alcohol detox and substance-induced mood disorder. Patient has history of alcohol-induced pancreatitis about 2 years ago and want to get detox and rehabilitation services this time. Patient has previously detox treatment from [facility] in the month of for April and then following up for coping skills and group therapies at [facility] [place] was not enough for him.   Martin Singh,JANARDHAHA R. 01/20/2012, 12:04 PM

## 2012-01-20 NOTE — Progress Notes (Signed)
Pt pleasant on approach, positive for group.  No complaints voiced, continues to detox from alcohol and to have some moderate tremors.  Interacting appropriately within milieu.  Support and encouragement offered, will continue to monitor.

## 2012-01-20 NOTE — Progress Notes (Signed)
[facility] Adult Inpatient Family/Significant Other Suicide Prevention Education  Suicide Prevention Education:  Education Completed; Loni Dolly [phone]) Fiance,  (name of family member/significant other) has been identified by the patient as the family member/significant other with whom the patient will be residing, and identified as the person(s) who will aid the patient in the event of a mental health crisis (suicidal ideations/suicide attempt).  With written consent from the patient, the family member/significant other has been provided the following suicide prevention education, prior to the and/or following the discharge of the patient.  The suicide prevention education provided includes the following:  Suicide risk factors  Suicide prevention and interventions  National Suicide Hotline telephone number  [facility] assessment telephone number  [facility] Emergency Assistance 911  [facility] and/or Residential Mobile Crisis Unit telephone number  Request made of family/significant other to:  Remove weapons (e.g., guns, rifles, knives), all items previously/currently identified as safety concern.    Remove drugs/medications (over-the-counter, prescriptions, illicit drugs), all items previously/currently identified as a safety concern.  The family member/significant other verbalizes understanding of the suicide prevention education information provided.  The family member/significant other agrees to remove the items of safety concern listed above.  Pt. accepted information on suicide prevention, warning signs to look for with suicide and crisis line numbers to use. The pt. agreed to call crisis line numbers if having warning signs or having thoughts of suicide.  Fiance stated that "any little stressor" sets him off and he drinks. She is worried about the pt.'s health and how the drinking is effecting his liver and body. She stated that the pt will "lie" to her  about his drinking. She would like to see the pt go to treatment following D/C at [facility] or go to a CD-IOP. Fiance stated that she will "not marry a drunk". She stated that "this is the last time before it is over". Pt can come home to stay with her if he can stay clean. She is taking pt off her car insurance and is going to confront the pt about her feelings during his stay here.    [facility] 01/20/2012, 10:41 AM

## 2012-01-20 NOTE — Progress Notes (Signed)
Patient ID: BALDO HUFNAGLE, male   DOB: 08-02-1966, 46 y.o.   MRN: [id number]   [facility] Group Notes:  (Counselor/Nursing/MHT/Case Management/Adjunct)  01/20/2012 1:15 PM  Type of Therapy:  Group Therapy, Dance/Movement Therapy   Participation Level:  Active  Participation Quality:  Appropriate and Attentive  Affect:  Appropriate  Cognitive:  Appropriate  Insight:  Good  Engagement in Group:  Good  Engagement in Therapy:  Good  Modes of Intervention:  Clarification, Problem-solving, Role-play, Socialization and Support  Summary of Progress/Problems: Therapist discussed letting go of addiction and adapting new and effective coping skills. Therapist invited group members to share negative things they would be leaving behind once they were discharged and modeled activity and drawing a hand on a sheet of paper and writ ng a healthy support or coping skill on each finger. This is the first group Kailon has participated in since his arrival. He appeared to be feeling much better, well rested, and was able to participate and process with the group. Hans shared that he would like to leave behind his anxiety and depression once he is discharged.     Gevena Mart

## 2012-01-20 NOTE — H&P (Signed)
Psychiatric Admission Assessment Adult  Patient Identification:  Martin Singh Date of Evaluation:  01/20/2012 46yo sep yet engaged white male  CC; need detox again  ETOH 272 UDS +THC & benzoes  History of Present Illness: Here May 24th for same. Reports relapse is  about a week . He says he is drinking a pint of brandy a day but has to drink or he will shake. Relapse was triggered by issues with his wife and in-laws-he is trying to get the divorce finalized.   Past Psychiatric History: Followed by [facility] the [place]  completed a 2 year stay [facility]  Treated for alcohol on and off 20 year sober as long as 5 years   Substance Abuse History:  Social History:    reports that he quit smoking about 20 years ago. His smoking use included Cigarettes. He has a 27 pack-year smoking history. His smokeless tobacco use includes Chew. He reports that he drinks alcohol. He reports that he uses illicit drugs (Marijuana). Seperated but engaged has a 43 yo son No-employed but does get unemployment .   Family Psych History: N/A Past Medical History:     Past Medical History  Diagnosis Date  . Hypertension   . Depressive disorder, not elsewhere classified   . Osteoarthrosis, unspecified whether generalized or localized, unspecified site   . Irritable bowel syndrome   . Esophageal reflux   . Colon polyps     hyperplastic/  . Allergic rhinitis   . Pancreatitis   . History of pneumonia   . Insomnia   . Anxiety   . Diverticulitis   . Alcohol abuse 2012       Past Surgical History  Procedure Date  . Tibula surgery     right  . Fibula fracture surgery     right    Allergies:  Allergies  Allergen Reactions  . Codeine Anaphylaxis  . Vicodin (Hydrocodone-Acetaminophen) Anaphylaxis  . Benazepril Cough  . Corticosteroids Other (See Comments)    Shaky and high blood pressure  . Lorazepam Other (See Comments)    Becomes violent and hallucinates  .  Prednisone Other (See Comments)    High blood pressure and shaky     Current Medications:  Prior to Admission medications   Medication Sig Start Date End Date Taking? Authorizing Provider  atenolol (TENORMIN) 50 MG tablet Take 50 mg by mouth daily.   Yes Historical Provider, MD  FLUoxetine (PROZAC) 20 MG capsule Take 40 mg by mouth daily.   Yes Historical Provider, MD  hydrochlorothiazide (MICROZIDE) 12.5 MG capsule Take 1 capsule (12.5 mg total) by mouth daily. For blood pressure/fluid 12/25/11  Yes Alyson Kuroski-Mazzei, DO  hydrOXYzine (VISTARIL) 50 MG capsule Take 50 mg by mouth 3 (three) times daily as needed. For anxiety.   Yes Historical Provider, MD  Multiple Vitamin (MULITIVITAMIN WITH MINERALS) TABS Take 1 tablet by mouth daily.   Yes Historical Provider, MD  omeprazole (PRILOSEC) 40 MG capsule Take 1 capsule in the morning and 1 capsule in the evening for Acid Reflux. 12/25/11 12/24/12 Yes Viviann Spare, FNP  PROBIOTIC CAPS Take 1 capsule by mouth daily. For intestinal health. 12/25/11  Yes Viviann Spare, FNP  traMADol (ULTRAM) 50 MG tablet Take 50 mg by mouth every 6 (six) hours as needed. For pain   Yes Historical Provider, MD    Mental Status Examination/Evaluation: Objective:  Appearance: Fairly Groomed  Psychomotor Activity:  Normal  Eye Contact::  Good  Speech:  Normal Rate  Volume:  Normal  Mood:  allright   Affect:  Appropriate  Thought Process:  Clear rational goal oriented -long term SA treatment   Orientation:  Full  Thought Content:  no AVH/psychosis   Suicidal Thoughts:  No  Homicidal Thoughts:  No  Judgement:  Fair  Insight:  Fair    DIAGNOSIS:    AXIS I Alcohol Abuse  AXIS II Deferred  AXIS III See medical history.  AXIS IV economic problems, occupational problems, problems related to social environment and problems with primary support group  AXIS V 51-60 moderate symptoms     Treatment Plan Summary:  Admit for medically supported alcohol  detox with Librium protocol. Continue Prozac & Vistaril. Asking for long term SA has been to [facility] in past. Agree with H&P from ED

## 2012-01-21 DIAGNOSIS — F10239 Alcohol dependence with withdrawal, unspecified: Secondary | ICD-10-CM

## 2012-01-21 DIAGNOSIS — F1994 Other psychoactive substance use, unspecified with psychoactive substance-induced mood disorder: Secondary | ICD-10-CM

## 2012-01-21 MED ORDER — NICOTINE 21 MG/24HR TD PT24
21.0000 mg | MEDICATED_PATCH | Freq: Every day | TRANSDERMAL | Status: DC
  Administered 2012-01-21 – 2012-01-23 (×3): 21 mg via TRANSDERMAL
  Filled 2012-01-21 (×6): qty 1

## 2012-01-21 NOTE — Treatment Plan (Signed)
Interdisciplinary Treatment Plan Update (Adult)  Date: 01/21/2012  Time Reviewed: 10:36 AM   Progress in Treatment: Attending groups: Yes Participating in groups: Yes Taking medication as prescribed: Yes Tolerating medication: Yes   Family/Significant other contact made:  Yes Patient understands diagnosis:  Yes  As evidenced by asking for help with alcohol withdrawal Discussing patient identified problems/goals with staff:  Yes   Medical problems stabilized or resolved:  Yes Denies suicidal/homicidal ideation: Yes  In tx team Issues/concerns per patient self-inventory:  Yes  Depression 7, hopelessness 8  C/O withdrawal symptoms Other:  New problem(s) identified: N/A  Reason for Continuation of Hospitalization: Depression Medication stabilization Withdrawal symptoms  Interventions implemented related to continuation of hospitalization: Librium taper  Encourage group attendance and participation  Additional comments:  Estimated length of stay: 2-3 days  Discharge Plan: See below  New goal(s): N/A  Review of initial/current patient goals per problem list:   1.  Goal(s): Safely detox from alcohol  Met:  No  Target date:5/29  As evidenced by:Stable vitals, CIWA score of 2 or less  2.  Goal (s): Identify comprehensive sobriety plan  Met:  No  Target date:5/29  As evidenced YN:WGNFA wants to get into an IOP program or rehab from here  3.  Goal(s):  Met:  No  Target date:  As evidenced by:  4.  Goal(s):  Met:  No  Target date:  As evidenced by:  Attendees: Patient:  Martin Singh 01/21/2012 10:36 AM  Family:     Physician:  Lupe Carney 01/21/2012 10:36 AM   Nursing: Robbie Louis   01/21/2012 10:36 AM   Case Manager:  Richelle Ito, LCSW 01/21/2012 10:36 AM   Counselor:   01/21/2012 10:36 AM   Other:     Other:     Other:     Other:      Scribe for Treatment Team:   Ida Rogue, 01/21/2012 10:36 AM

## 2012-01-21 NOTE — Progress Notes (Signed)
Pt has been up and active in the milieu today.  He rated his depression a 7 hopelessness an 8 and his anxiety 5.  He denied any S/H ideations or A/V hallucinations.  He has one prn med for his pain at 1206 which he felt he got relief.  Pt wants to try and get into [facility] or some type of IOP.  His CIWA has been between 2-3 today mainly for tremors and mild anxiety and sweaty palms.  Pt was wanting to discuss his lab work this afternoon. Discussed with him that the doctor has to tell nursing staff if they can give out that information.  He did voice understanding.

## 2012-01-21 NOTE — Progress Notes (Signed)
Pt reports he is still having withdrawal symptoms and showed this writer his hands.  He tells this Clinical research associate that he is supposed to get 50mg  of Vistaril and 50mg  of Librium at night.  Explained the Librium taper protocol to pt and that the Vistaril had been discontinued today.  Pt responds, "who do I talk to about this?"  Encourage pt to speak to his MD in the AM.  Pt is given Tramadol for his chronic leg pain and Librium for his withdrawal symptoms.  Pt denies SI/HI.  Safety maintained with q15 minute checks.

## 2012-01-21 NOTE — Progress Notes (Signed)
[facility] MD Progress Note  01/21/2012 2:59 PM   S/O: Patient seen and evaluated. Chart reviewed. Patient stated that his mood was "good". His affect was mood congruent and euthymic. He denied any current thoughts of self injurious behavior, suicidal ideation or homicidal ideation. He denied any significant depressive signs or symptoms at this time. There were no auditory or visual hallucinations, paranoia, delusional thought processes, or mania noted.  Thought process was linear and goal directed.  No psychomotor agitation or retardation was noted. His speech was normal rate, tone and volume. Eye contact was good. Judgment and insight are fair.  Patient has been up and engaged on the unit.  No acute safety concerns reported from team.  Sleep:  Number of Hours: 6    Vital Signs:Blood pressure 138/89, pulse 54, temperature 96.9 F (36.1 C), temperature source Oral, resp. rate 16, height 4\' 10"  (1.473 m), weight 72.576 kg (160 lb), SpO2 99.00%.  Current Medications:    . acidophilus  1 capsule Oral Daily  . atenolol  50 mg Oral Daily  . chlordiazePOXIDE  25 mg Oral QID   Followed by  . chlordiazePOXIDE  25 mg Oral TID   Followed by  . chlordiazePOXIDE  25 mg Oral BH-qamhs   Followed by  . chlordiazePOXIDE  25 mg Oral Daily  . FLUoxetine  40 mg Oral Daily  . hydrochlorothiazide  12.5 mg Oral Daily  . mulitivitamin with minerals  1 tablet Oral Daily  . nicotine  21 mg Transdermal Q0600  . pantoprazole  80 mg Oral Q1200  . thiamine  100 mg Intramuscular Once  . thiamine  100 mg Oral Daily    Lab Results: No results found for this or any previous visit (from the past 48 hour(s)).  A/P: Alcohol Dependence & W/D; SIMD; Reading & Writing LD  Pt seen and evaluated in treatment team.  Reviewed short term and long term goals, medications, current treatment in the hospital and acute/chronic safety.  Pt denied any current thoughts of self harm, suicidal ideation or homicidal ideation.  Contracted for  safety on the unit.  No acute issues noted. VS reviewed with team.  Pt agreeable with treatment plan, see orders. Continue current medications and treatment plan. Interested in [place], discharge possibly Weds or Thurs. Depending on bed availability.  Lupe Carney 01/21/2012, 2:59 PM

## 2012-01-21 NOTE — H&P (Signed)
Cosigned for Dr. Jonnalagadda. 

## 2012-01-22 LAB — COMPREHENSIVE METABOLIC PANEL
ALT: 164 U/L — ABNORMAL HIGH (ref 0–53)
AST: 145 U/L — ABNORMAL HIGH (ref 0–37)
Albumin: 4 g/dL (ref 3.5–5.2)
Alkaline Phosphatase: 118 U/L — ABNORMAL HIGH (ref 39–117)
Potassium: 4 mEq/L (ref 3.5–5.1)
Sodium: 137 mEq/L (ref 135–145)
Total Protein: 7 g/dL (ref 6.0–8.3)

## 2012-01-22 MED ORDER — ATENOLOL 50 MG PO TABS
50.0000 mg | ORAL_TABLET | Freq: Two times a day (BID) | ORAL | Status: DC
  Administered 2012-01-22 – 2012-01-23 (×2): 50 mg via ORAL
  Filled 2012-01-22 (×5): qty 1

## 2012-01-22 MED ORDER — ATENOLOL 50 MG PO TABS: 50.0000 mg | ORAL_TABLET | Freq: Every day | ORAL | Status: DC

## 2012-01-22 MED ORDER — HYDROXYZINE HCL 25 MG PO TABS
25.0000 mg | ORAL_TABLET | Freq: Three times a day (TID) | ORAL | Status: DC
  Filled 2012-01-22 (×2): qty 1

## 2012-01-22 MED ORDER — HYDROCHLOROTHIAZIDE 25 MG PO TABS
25.0000 mg | ORAL_TABLET | Freq: Every day | ORAL | Status: DC
  Administered 2012-01-23: 25 mg via ORAL
  Filled 2012-01-22 (×3): qty 1

## 2012-01-22 MED ORDER — HYDROCHLOROTHIAZIDE 12.5 MG PO CAPS
12.5000 mg | ORAL_CAPSULE | Freq: Once | ORAL | Status: AC
  Administered 2012-01-22: 12.5 mg via ORAL
  Filled 2012-01-22: qty 1

## 2012-01-22 MED ORDER — HYDROXYZINE HCL 50 MG PO TABS
50.0000 mg | ORAL_TABLET | Freq: Three times a day (TID) | ORAL | Status: DC
  Administered 2012-01-22 – 2012-01-23 (×3): 50 mg via ORAL
  Filled 2012-01-22 (×6): qty 1

## 2012-01-22 NOTE — Progress Notes (Signed)
[facility] Group Notes:  (Counselor/Nursing/MHT/Case Management/Adjunct)  01/22/2012 10:05 PM  Type of Therapy:  Group Therapy at 11AM  Participation Level:  Active  Participation Quality:  Attentive and Sharing  Affect:  Appropriate  Cognitive:  Alert and Oriented  Insight:  Limited  Engagement in Group:  Good  Engagement in Therapy:  Limited  Modes of Intervention:  Clarification, Socialization and Support  Summary of Progress/Problems: Martin Singh was attentive to discussion regarding obstacles to recovery and feelings about diagnosis. "I just wish other people understood that diagnosis is not the end all; it can happen and it happened to me.  I feel some guilt but it is like others want me to feel shams." Others in group offered support.  Martin Singh shared his biggest obstacle may be other people but did not share specifics.    Clide Dales 01/22/2012, 10:12 PM

## 2012-01-22 NOTE — Discharge Planning (Signed)
Spoke with patient's fiance with patient.  Plan that they both agreed on is continue with therapist at [facility] the [place], attend T and Th groups there, and attend 90 AA mtgs in 90 days and get a sponsor.

## 2012-01-22 NOTE — Progress Notes (Addendum)
Patient ID: Martin Singh, male   DOB: [birth year], 46 y.o.   MRN: [id number] Fully alert, cooperative, with good eye contact.  He is pleasant on approach.  Rates his depression a 3/10 today on a 1-10 scale if 10 is the worst symptoms. Anxiety is 8/10 and he displays a fine motor tremor.  He denies suicidal thoughts. He has brought his med list with him and we have reviewed it.  He takes Hydroxyzine 50mg  TID at home and I have adjusted his doses here to that schedule.   He was taking Bystolic 5mg  at home for HTN from Dr. Yetta Barre at [facility] - but he can't afford it so he complained to [facility] the [place] who gave him a script for Atenolol which he had never filled but which we have him on here today.  He wants to stick with the Atenolol but is complaining of a mild headache on and off since arrival.  He received fluids in the ED for BUN of 26 and creatinine of upper normal.  Pupils are 4mm bilateral today, he has some mildly blurred vision. Pulse is 70. Blood pressure taken by me: 142/104  Mental STatus Exam: Fully alert male with average intelligence, pleasant on approach and in full contact with reality.  Mood neutral, affect pleasant. No dangerous ideas.  Thoughts and speech are normally organized and thought content is future-oriented on how to stay sober after he leaves.   A:  Alcohol Abuse with Depression, on prozac.   P: Will hold prozac in AM til I can review my findings with Dr. Audry Pili, given his enlarged pupils and elevated BP.  Increase Atenolol to 50mg  bid and increase HCTZ to 25mg  daily.  Recheck CMET.

## 2012-01-22 NOTE — Progress Notes (Signed)
Pt reports his day has been better than yesterday, but he is still depressed.  He denies SI/HI.  His Prozac was discontinued today and labs were also drawn b/c his BP continues to be elevated.  He was more cheerful in conversation and jovial with this Clinical research associate at [facility].  He is a client of [employer] [facility] the [place] and plans to f/u with them and do outpt and go to [employer].  He will probably be discharged Wed or Thurs.  He voices no concerns at this time.  Safety maintained with q15 minute checks.

## 2012-01-22 NOTE — Progress Notes (Signed)
01/22/2012         Time: 1415      Group Topic/Focus: The focus of this group is on discussing various styles of communication and communicating assertively using 'I' (feeling) statements.  Participation Level: Active  Participation Quality: Appropriate and Attentive  Affect: Blunted  Cognitive: Oriented   Additional Comments: Patient late to group after meeting with his nurse.   Martin Singh 01/22/2012 3:48 PM

## 2012-01-22 NOTE — Progress Notes (Signed)
Pt has been up and active in the milieu today.  He rated both his depression and hopelessness a 5 and his anxiety a 8 on his self-inventory.  He denied any symptoms of withdrawal except for his anxiety.  He denied any S/H ideation or A/V hallucinations.  He has had a couple of prn meds for pain and anxiety today with positive outcomes.  His new orders today include lab, d/c prn 25 mg vistaril and changed his vistaril to 50 mg TID scheduled.  His prozac was discontinued until NP can speak with Dr. Kirtland Bouchard tomorrow.

## 2012-01-23 MED ORDER — HYDROCHLOROTHIAZIDE 25 MG PO TABS: 25.0000 mg | ORAL_TABLET | Freq: Every day | ORAL | Status: DC

## 2012-01-23 MED ORDER — ADULT MULTIVITAMIN W/MINERALS CH: 1.0000 | ORAL_TABLET | Freq: Every day | ORAL | Status: DC

## 2012-01-23 MED ORDER — ATENOLOL 50 MG PO TABS: ORAL_TABLET | ORAL | Status: DC

## 2012-01-23 NOTE — Progress Notes (Signed)
Patient ID: Martin Singh, male   DOB: 12/11/1965, 46 y.o.   MRN: [id number] Patient seen briefly this morning. He states "I feel much better. Blood pressure is 120/87, pulse 65. His tremor is gone. She was 23 mm bilaterally. He denies anxiety. He denies suicidal thoughts. He denies depression. He is requesting to go home.  We discussed his medications instructions given. He will [facility]up with [facility] [place] for BP checks and will continue on the Atenolol.  Rx given.

## 2012-01-23 NOTE — [facility] Suicide Risk Assessment (Signed)
Suicide Risk Assessment  Discharge Assessment      Demographic factors: Male;Divorced or widowed;Caucasian;Unemployed  Current Mental Status Per Nursing Assessment: On Admission:   (denies si/hi/av) At Discharge:  Pt denied any SI/HI/thoughts of self harm or acute psychiatric issues in treatment team with clinical, nursing and medical team present.  Current Mental Status Per Physician: Patient seen and evaluated in team. Chart reviewed. Patient stated that his mood was "better". His affect was mood congruent and euthymic. He denied any current thoughts of self injurious behavior, suicidal ideation or homicidal ideation. He denied any significant depressive signs or symptoms at this time. There were no auditory or visual hallucinations, paranoia, delusional thought processes, or mania noted.  Thought process was linear and goal directed.  No psychomotor agitation or retardation was noted. His speech was normal rate, tone and volume. Eye contact was good. Judgment and insight are fair.  Patient has been up and engaged on the unit.  No acute safety concerns reported from team.  Slept "real good" last night and feeling better since adjustment in meds yesterday.  Loss Factors: Decline in physical health  Historical Factors:  Longstanding alcoholism; sees benefit of AA; f/u at [facility]   Risk Reduction Factors: Responsible for children under 35 years of age;Sense of responsibility to family;Positive social support;Positive therapeutic relationship; willing to continue medications and therapy  Discharge Diagnoses:  AXIS I: Alcohol Dependence; SIMD; Reading & Writing LD AXIS II:  Deferred AXIS III:   Past Medical History  Diagnosis Date  . Hypertension   . Depressive disorder, not elsewhere classified   . Osteoarthrosis, unspecified whether generalized or localized, unspecified site   . Irritable bowel syndrome   . Esophageal reflux   . Colon polyps     hyperplastic/  . Allergic rhinitis   .  Pancreatitis   . History of pneumonia   . Insomnia   . Anxiety   . Diverticulitis   . Alcohol abuse 2012   AXIS IV: moderate AXIS V:  55  Cognitive Features That Contribute To Risk: limited insight.  Suicide Risk: Pt viewed as a chronic moderate increased risk of harm to self in light of his past hx and risk factors.  No acute safety concerns since on the unit.  Pt contracting for safety and detox complete.  Vitals:  Filed Vitals:   01/23/12 0730  BP: 120/87  Pulse: 65  Temp: 97.2 F (36.2 C)  Resp: 16    Plan Of Care/Follow-up recommendations: Pt seen and evaluated in treatment team. Chart reviewed.  Pt stable for and requesting discharge. Pt contracting for safety and does not currently meet [place] involuntary commitment criteria for continued hospitalization against his will.  Mental health treatment, medication management and continued sobriety will mitigate against the potential increased risk of harm to self and/or others.  Discussed the importance of recovery further with pt, as well as, tools to move forward in a healthy & safe manner.  Pt agreeable with the plan.  Discussed with the team.  Please see orders, follow up appointments per AVS ([facility]) and full discharge summary to be completed by physician extender.  Recommend follow up with AA, rec 90/90  Diet: Heart Healthy.  Activity: As tolerated.     Lupe Carney 01/23/2012, 10:37 AM

## 2012-01-23 NOTE — Progress Notes (Signed)
Pt was discharged home today.  He denied any S/I H/I or A/V hallucinations.    He was given f/u appointment, rx, sample medications, hotline info booklet, labs for his md.  He voiced understanding to all instructions provided.  He declined the need for smoking cessation materials.  He removed his nicotine patch before he left.

## 2012-01-23 NOTE — Treatment Plan (Signed)
Interdisciplinary Treatment Plan Update (Adult)  Date: 01/23/2012  Time Reviewed: 10:22 AM   Progress in Treatment: Attending groups: Yes Participating in groups: Yes Taking medication as prescribed: Yes Tolerating medication: Yes   Family/Significant othe contact made:   Patient understands diagnosis:  Yes Discussing patient identified problems/goals with staff:  Yes Medical problems stabilized or resolved:  Yes Denies suicidal/homicidal ideation: Yes  In tx team Issues/concerns per patient self-inventory:  None noted Other:  New problem(s) identified: N/A  Reason for Continuation of Hospitalization: Other; describe D/C today  Interventions implemented related to continuation of hospitalization:   Additional comments:  Estimated length of stay:  Discharge Plan: Return home, see below  New goal(s): N/A  Review of initial/current patient goals per problem list:   1.  Goal(s):Safely detox from alcohol  Met:  Yes  Target date:5/29  As evidenced RU:EAVWUJ vitals,  Now withdrawal symptoms  2.  Goal (s): Identify comprehensive sobriety plan  Met:  Yes  Target date:5/29  As evidenced by:Attend bi-weekly groups at [facility] and weekly individual therapy,  90 AA mtgs in 90 days and get a sponsor  3.  Goal(s):  Met:  Yes  Target date:  As evidenced by:  4.  Goal(s):  Met:  Yes  Target date:  As evidenced by:  Attendees: Patient: Martin Singh  01/23/2012 10:22 AM  Family:     Physician:  Lupe Carney 01/23/2012 10:22 AM   Nursing: Robbie Louis   01/23/2012 10:22 AM   Case Manager:  Richelle Ito, LCSW 01/23/2012 10:22 AM   Counselor:  Ronda Fairly, LCSWA 01/23/2012 10:22 AM   Other:     Other:     Other:     Other:      Scribe for Treatment Team:   Ida Rogue, 01/23/2012 10:22 AM

## 2012-01-23 NOTE — Progress Notes (Signed)
[facility] Case Management Discharge Plan:  Will you be returning to the same living situation after discharge: Yes,  home At discharge, do you have transportation home?:Yes,  fiance Do you have the ability to pay for your medications:Yes,  [facility]  Interagency Information:     Release of information consent forms completed and in the chart;  Patient's signature needed at discharge.  Patient to Follow up at:  Follow-up Information    Follow up with [facility] the [place] on 01/28/2012. (Monday at 2:00 with [name]  Also, continue groups on Tues and Thurs at 5:00)    Contact information:   [address]  [phone]      Follow up with IOP provider in [facility]. (Call if you want to find out about services there)    Contact information:   [facility]  [address]  [place] [336] [place] [address]         Patient denies SI/HI:   Yes,  yes    Safety Planning and Suicide Prevention discussed:  Yes,  yes  Barrier to discharge identified:No.  Summary and Recommendations:   Martin Singh 01/23/2012, 11:15 AM

## 2012-01-23 NOTE — Discharge Instructions (Signed)
Follow up with Dr. Sanda Linger at [facility] within the month for blood pressure recheck, and review of liver enzymes.    Take a copy of your labs with you.

## 2012-01-24 NOTE — Progress Notes (Signed)
Patient Discharge Instructions:  After Visit Summary (AVS):   Faxed to:  01/24/2012 Access to EMR:  01/24/2012 Psychiatric Admission Assessment Note:   Faxed to:  01/24/2012 Access to EMR:  01/24/2012 Suicide Risk Assessment - Discharge Assessment:   Faxed to:  01/24/2012 Access to EMR:  01/24/2012 Faxed/Sent to the Next Level Care provider:  01/24/2012 Next Level Care Provider Has Access to the EMR, 01/24/2012  Faxed to [facility] of the [place] @ [phone] And records provided to [place] - Dr. Sanda Linger via [facility]/Epic access.  Wandra Scot, 01/24/2012, 6:27 PM

## 2012-01-28 NOTE — Discharge Summary (Signed)
Physician Discharge Summary Note  Patient:  Martin Singh is an 46 y.o., male MRN:  [id number] DOB:  1965/10/14 Patient phone:  [phone] (home)  Patient address:   [address] [address] [place] [address],   Date of Admission:  01/19/2012 Date of Discharge: 01/22/2014  Discharge Diagnoses:  AXIS I: Alcohol Dependence; SIMD; Reading & Writing LD  AXIS II: Deferred  AXIS III:  Past Medical History   Diagnosis  Date   .  Hypertension    .  Depressive disorder, not elsewhere classified    .  Osteoarthrosis, unspecified whether generalized or localized, unspecified site    .  Irritable bowel syndrome    .  Esophageal reflux    .  Colon polyps      hyperplastic/   .  Allergic rhinitis    .  Pancreatitis    .  History of pneumonia    .  Insomnia    .  Anxiety    .  Diverticulitis    .  Alcohol abuse  2012   AXIS IV: moderate  AXIS V: 55  Level of Care:  OP  Hospital Course:   Martin Singh presented intoxicated, with an alcohol level of 272 mg percent, after relapsing on alcohol the previous week. He was drinking a pint of brandy daily, and when he tried to stop had problems with getting shaky. He had a history of 20 years of alcohol abuse with likely periods of sobriety, up to 5 years. He had been sober leading up to his one-week relapse. He said his family stressors as the reason for the relapse.  Noted to our dual diagnosis unit and detoxed uneventfully, with a Librium detox protocol. He tolerated medications well.Our counselors helped him identify relapse triggers, and develop a relapse prevention program.   Patient in group therapy activities was satisfactory. He clearly and consistently denied any suicidal thoughts or intent. Liver enzymes were mildly elevated.   He was already taking Prozac depression at the time he was admitted. This was eventually stopped when he showed some symptoms of sympathetic activation including 3-62mm pupils and elevated pulse and blood pressure, after he had  completed his detox.  Consults:  None  Discharge Vitals:   Blood pressure 120/87, pulse 65, temperature 97.2 F (36.2 C), temperature source Oral, resp. rate 16, height 4\' 10"  (1.473 m), weight 72.576 kg (160 lb), SpO2 99.00%.  Mental Status Exam: See Mental Status Examination and Suicide Risk Assessment completed by Attending Physician prior to discharge.  Discharge destination:  Home  Is patient on multiple antipsychotic therapies at discharge:  No   Has Patient had three or more failed trials of antipsychotic monotherapy by history:  No  Recommended Plan for Multiple Antipsychotic Therapies: N/A   Medication List  As of 01/28/2012 12:14 PM   STOP taking these medications         FLUoxetine 20 MG capsule      hydrochlorothiazide 12.5 MG capsule         TAKE these medications      Indication    atenolol 50 MG tablet   Commonly known as: TENORMIN   Take one tablet (50mg ) by mouth daily in am and with evening meal, for high blood pressure.       hydrochlorothiazide 25 MG tablet   Commonly known as: HYDRODIURIL   Take 1 tablet (25 mg total) by mouth daily. For high blood pressure       hydrOXYzine 50 MG capsule  Commonly known as: VISTARIL   Take 50 mg by mouth 3 (three) times daily as needed. For anxiety.       mulitivitamin with minerals Tabs   Take 1 tablet by mouth daily. Vitamin supplement       omeprazole 40 MG capsule   Commonly known as: PRILOSEC   Take 1 capsule in the morning and 1 capsule in the evening for Acid Reflux.       PROBIOTIC Caps   Take 1 capsule by mouth daily. For intestinal health.       traMADol 50 MG tablet   Commonly known as: ULTRAM   Take 50 mg by mouth every 6 (six) hours as needed. For pain            Follow-up Information    Follow up with [facility] of the [place] on 01/28/2012. (Monday at 2:00 with [name]  Also, continue groups on Tues and Thurs at 5:00)    Contact information:   [address]  [phone]       Follow up with IOP provider in [facility]. (Call if you want to find out about services there)    Contact information:   [facility]  [address]  [place] [336] [place] [address]      Follow up with Sanda Linger, MD. (Follow up with Dr. Yetta Barre within one month for blood pressure check and review of liver enzymes. )    Contact information:   [address] [facility] [address] [place], [address] [place] [address] [phone]          Follow-up recommendations:  Activity:  unrestricted Diet:  regular  Signed: Marquiz Sotelo A 01/28/2012, 12:14 PM

## 2012-04-03 ENCOUNTER — Telehealth: Payer: Self-pay

## 2012-04-03 NOTE — Telephone Encounter (Signed)
[name] called on behalf of pt requesting a letter stating that he no longer needs Diazepam. Pt will be entering alcohol rehab facility next week and they require letter prior to admittance. Pt is also requesting a refill of Tramadol, please advise.

## 2012-04-03 NOTE — Telephone Encounter (Signed)
I have not seen him recently so I can't make a comment about this, phone notes in March 2013 indicated that he had changed doctors

## 2012-04-12 ENCOUNTER — Encounter (HOSPITAL_BASED_OUTPATIENT_CLINIC_OR_DEPARTMENT_OTHER): Payer: Self-pay | Admitting: Emergency Medicine

## 2012-04-12 ENCOUNTER — Emergency Department (HOSPITAL_BASED_OUTPATIENT_CLINIC_OR_DEPARTMENT_OTHER)
Admission: EM | Admit: 2012-04-12 | Discharge: 2012-04-12 | Disposition: A | Discharge status: Home / Self Care | Payer: Self-pay | Attending: Emergency Medicine | Admitting: Emergency Medicine

## 2012-04-12 DIAGNOSIS — Z87891 Personal history of nicotine dependence: Secondary | ICD-10-CM | POA: Insufficient documentation

## 2012-04-12 DIAGNOSIS — R209 Unspecified disturbances of skin sensation: Secondary | ICD-10-CM | POA: Insufficient documentation

## 2012-04-12 DIAGNOSIS — K589 Irritable bowel syndrome without diarrhea: Secondary | ICD-10-CM | POA: Insufficient documentation

## 2012-04-12 DIAGNOSIS — R5381 Other malaise: Secondary | ICD-10-CM | POA: Insufficient documentation

## 2012-04-12 DIAGNOSIS — R531 Weakness: Secondary | ICD-10-CM

## 2012-04-12 DIAGNOSIS — I10 Essential (primary) hypertension: Secondary | ICD-10-CM | POA: Insufficient documentation

## 2012-04-12 DIAGNOSIS — K219 Gastro-esophageal reflux disease without esophagitis: Secondary | ICD-10-CM | POA: Insufficient documentation

## 2012-04-12 DIAGNOSIS — Z7982 Long term (current) use of aspirin: Secondary | ICD-10-CM | POA: Insufficient documentation

## 2012-04-12 DIAGNOSIS — F341 Dysthymic disorder: Secondary | ICD-10-CM | POA: Insufficient documentation

## 2012-04-12 LAB — BASIC METABOLIC PANEL
CO2: 28 mEq/L (ref 19–32)
Calcium: 10.1 mg/dL (ref 8.4–10.5)
Chloride: 99 mEq/L (ref 96–112)
GFR calc Af Amer: >90 mL/min (ref >90)
Sodium: 137 mEq/L (ref 135–145)

## 2012-04-12 LAB — CBC WITH DIFFERENTIAL/PLATELET
Basophils Absolute: 0 10*3/uL (ref 0.0–0.1)
Lymphocytes Relative: 30 % (ref 12–46)
Neutro Abs: 3.1 10*3/uL (ref 1.7–7.7)
Platelets: 198 10*3/uL (ref 150–400)
RDW: 14.8 % (ref 11.5–15.5)
WBC: 5.6 10*3/uL (ref 4.0–10.5)

## 2012-04-12 LAB — GLUCOSE, CAPILLARY: Glucose-Capillary: 103 mg/dL — ABNORMAL HIGH (ref 70–99)

## 2012-04-12 MED ORDER — SODIUM CHLORIDE 0.9 % IV SOLN: INTRAVENOUS | Status: DC

## 2012-04-12 MED ORDER — DIPHENHYDRAMINE HCL 50 MG/ML IJ SOLN
25.0000 mg | Freq: Once | INTRAMUSCULAR | Status: AC
  Administered 2012-04-12: 25 mg via INTRAVENOUS
  Filled 2012-04-12: qty 1

## 2012-04-12 MED ORDER — METOCLOPRAMIDE HCL 5 MG/ML IJ SOLN
10.0000 mg | Freq: Once | INTRAMUSCULAR | Status: AC
  Administered 2012-04-12: 10 mg via INTRAVENOUS
  Filled 2012-04-12: qty 2

## 2012-04-12 MED ORDER — SODIUM CHLORIDE 0.9 % IV BOLUS (SEPSIS)
1000.0000 mL | Freq: Once | INTRAVENOUS | Status: AC
  Administered 2012-04-12: 1000 mL via INTRAVENOUS

## 2012-04-12 NOTE — ED Notes (Signed)
[facility] called to transport patient back to facility

## 2012-04-12 NOTE — ED Notes (Signed)
Took patient's blood sugar, result was: 103. Nurse was notified.

## 2012-04-12 NOTE — ED Notes (Signed)
Pt has signed d/c papers- waiting for [facility] transport-

## 2012-04-12 NOTE — ED Provider Notes (Signed)
History    Scribed for Martin Horn, MD, the patient was seen in room MH05/MH05. This chart was scribed by Martin Singh.   CSN: [id number]  Arrival date & time 04/12/12  1611   First MD Initiated Contact with Patient 04/12/12 1617      Chief Complaint  Patient presents with  . Shaking    (Consider location/radiation/quality/duration/timing/severity/associated sxs/prior treatment) HPI Martin Horn, MD entered patient's room at 4:21 PM   Martin Singh is a 46 y.o. male who presents to the Emergency Department complaining of sudden onset of general shaking, lightheadedness, vertigo and bilateral facial numbness worse on the left than the right after a light workout.  Reports being sweaty and nauseous while having generalized weakness and palpitations around 4 PM today.  Symptoms improved in about a half hour.  Patient with continued shaking and bilateral facial numbness.  Denies chest pain and headache.  Patient with history of EtOH and benzo abuse.  Patient had Valium a week and half ago which was prescribed by Dr. Sanda Singh.  Patient last had EtOH about a month ago.  Patient went to [facility] for help so he could continue with his sobriety.  Patient felt shaky and weak for past couple days but symptoms worsened today.  There has been no change in speech.  No history of DVTs or DM.       Past Medical History  Diagnosis Date  . Hypertension   . Depressive disorder, not elsewhere classified   . Osteoarthrosis, unspecified whether generalized or localized, unspecified site   . Irritable bowel syndrome   . Esophageal reflux   . Colon polyps     hyperplastic/  . Allergic rhinitis   . Pancreatitis   . History of pneumonia   . Insomnia   . Anxiety   . Diverticulitis   . Alcohol abuse 2012  . Benzodiazepine abuse     Past Surgical History  Procedure Date  . Tibula surgery     right  . Fibula fracture surgery     right    Family History  Problem Relation Age of Onset    . Heart disease Mother   . Gallbladder disease Mother   . Nephrolithiasis Mother   . Colon cancer Neg Hx   . Hypertension Father     moth  . Arthritis Other   . Hyperlipidemia Other   . Stroke Other     History  Substance Use Topics  . Smoking status: Former Smoker -- 1.5 packs/day for 18 years    Types: Cigarettes    Quit date: 08/28/1991  . Smokeless tobacco: Current User    Types: Chew  . Alcohol Use: Yes     everyday 1/2 to 1 pint  daily      Review of Systems 10 Systems reviewed and all are negative for acute change except as noted in the HPI.   Allergies  Codeine; Vicodin; Benazepril; Ativan; Corticosteroids; Lorazepam; and Prednisone  Home Medications   Current Outpatient Rx  Name Route Sig Dispense Refill  . ASPIRIN 81 MG PO TABS Oral Take 81 mg by mouth daily.    . ATENOLOL 50 MG PO TABS  Take one tablet (50mg ) by mouth daily in am and with evening meal, for high blood pressure. 60 tablet 0  . CARBAMAZEPINE 200 MG PO TABS Oral Take 200 mg by mouth 2 (two) times daily.    Marland Kitchen FLUOXETINE HCL 40 MG PO CAPS Oral Take 40 mg by mouth  daily.    Marland Kitchen HYDROCHLOROTHIAZIDE 25 MG PO TABS Oral Take 1 tablet (25 mg total) by mouth daily. For high blood pressure 30 tablet 0  . IBUPROFEN 200 MG PO TABS Oral Take 200 mg by mouth every 6 (six) hours as needed. For pain    . ADULT MULTIVITAMIN W/MINERALS CH Oral Take 1 tablet by mouth daily. Vitamin supplement    . OMEPRAZOLE 40 MG PO CPDR  Take 1 capsule in the morning and 1 capsule in the evening for Acid Reflux. 60 capsule 3  . TRAMADOL HCL 50 MG PO TABS Oral Take 50 mg by mouth every 6 (six) hours as needed. For pain      BP 138/90  Pulse 65  Temp 98.5 F (36.9 C) (Oral)  Resp 16  Ht 5' (1.524 m)  Wt 160 lb (72.576 kg)  BMI 31.25 kg/m2  SpO2 100%  Physical Exam  Nursing note and vitals reviewed. Constitutional:       Awake, alert, nontoxic appearance with baseline speech for patient.  HENT:  Head: Atraumatic.   Right Ear: Tympanic membrane normal.  Left Ear: Tympanic membrane normal.  Mouth/Throat: Oropharynx is clear and moist. No oropharyngeal exudate.       Subjective decreased hearing on the left as compared to the right  Eyes: EOM are normal. Pupils are equal, round, and reactive to light. Right eye exhibits no discharge. Left eye exhibits no discharge. Right eye exhibits no nystagmus. Left eye exhibits no nystagmus.  Neck: Neck supple.  Cardiovascular: Normal rate and regular rhythm.   No murmur heard. Pulmonary/Chest: Effort normal and breath sounds normal. No stridor. No respiratory distress. He has no wheezes. He has no rales. He exhibits no tenderness.  Abdominal: Soft. Bowel sounds are normal. He exhibits no mass. There is no tenderness. There is no rebound.  Musculoskeletal: He exhibits no tenderness.       Baseline ROM, moves extremities with no obvious new focal weakness.  Lymphadenopathy:    He has no cervical adenopathy.  Neurological:       Awake, alert, cooperative and aware of situation; motor strength bilaterally; sensation normal to light touch bilaterally; peripheral visual fields full to confrontation; no facial asymmetry; tongue midline; major cranial nerves appear intact; no pronator drift, normal finger to nose bilaterally, baseline gait without new ataxia.  Skin: No rash noted.  Psychiatric: His mood appears anxious. He expresses no suicidal ideation.    ED Course  Procedures (including critical care time) ECG: Normal sinus rhythm, ventricular rate 67, normal axis, normal intervals, no acute ischemic changes noted, no significant change compared with February 2013  DIAGNOSTIC STUDIES: Oxygen Saturation is 100% on room air, normal by my interpretation.     COORDINATION OF CARE: 4:33 PM  Physical exam complete.   4:45 PM  IV fluids ordered.   6:00 PM  Recheck.  Patient with persistent left ear pain with bilateral tingling and bilateral facial pain.  Will treat with  Reglan and Benadryl.    6:06 PM Patient informed of clinical course, understand medical decision-making process, and agree with plan.  9:32 PM  Pt feels improved after observation and/or treatment in ED.  Minimal left sided facial pain.         LABS / RADIOLOGY:    Labs Reviewed  CBC WITH DIFFERENTIAL - Abnormal; Notable for the following:    RBC 3.69 (*)     Hemoglobin 12.2 (*)     HCT 35.5 (*)  Monocytes Relative 14 (*)     All other components within normal limits  GLUCOSE, CAPILLARY - Abnormal; Notable for the following:    Glucose-Capillary 103 (*)     All other components within normal limits  BASIC METABOLIC PANEL   No results found.       MDM  Doubt SAH, CVA, SBI, withdrawal.       MEDICATIONS GIVEN IN THE E.D. Scheduled Meds:    . diphenhydrAMINE  25 mg Intravenous Once  . metoCLOPramide (REGLAN) injection  10 mg Intravenous Once  . sodium chloride  1,000 mL Intravenous Once   Continuous Infusions:    . DISCONTD: sodium chloride         IMPRESSION: 1. Weakness generalized      NEW MEDICATIONS: New Prescriptions   No medications on file    I personally performed the services described in this documentation, which was scribed in my presence. The recorded information has been reviewed and considered.         Martin Horn, MD 04/13/12 1534

## 2012-04-12 NOTE — ED Notes (Signed)
Per EMS:  Pt from [facility], being treated for detox.  Pt has some fine muscle tremors, Pt states they are worse today.  Last use of Valium approximately one week ago.  Last ETOH one month ago.

## 2012-04-12 NOTE — ED Notes (Signed)
MD at bedside. 

## 2012-04-18 ENCOUNTER — Emergency Department (HOSPITAL_BASED_OUTPATIENT_CLINIC_OR_DEPARTMENT_OTHER)
Admission: EM | Admit: 2012-04-18 | Discharge: 2012-04-18 | Disposition: A | Discharge status: Home / Self Care | Payer: Self-pay | Attending: Emergency Medicine | Admitting: Emergency Medicine

## 2012-04-18 ENCOUNTER — Encounter (HOSPITAL_BASED_OUTPATIENT_CLINIC_OR_DEPARTMENT_OTHER): Payer: Self-pay

## 2012-04-18 DIAGNOSIS — Z823 Family history of stroke: Secondary | ICD-10-CM | POA: Insufficient documentation

## 2012-04-18 DIAGNOSIS — F411 Generalized anxiety disorder: Secondary | ICD-10-CM | POA: Insufficient documentation

## 2012-04-18 DIAGNOSIS — Z8249 Family history of ischemic heart disease and other diseases of the circulatory system: Secondary | ICD-10-CM | POA: Insufficient documentation

## 2012-04-18 DIAGNOSIS — Z8261 Family history of arthritis: Secondary | ICD-10-CM | POA: Insufficient documentation

## 2012-04-18 DIAGNOSIS — Z888 Allergy status to other drugs, medicaments and biological substances status: Secondary | ICD-10-CM | POA: Insufficient documentation

## 2012-04-18 DIAGNOSIS — Z841 Family history of disorders of kidney and ureter: Secondary | ICD-10-CM | POA: Insufficient documentation

## 2012-04-18 DIAGNOSIS — Z7982 Long term (current) use of aspirin: Secondary | ICD-10-CM | POA: Insufficient documentation

## 2012-04-18 DIAGNOSIS — K5730 Diverticulosis of large intestine without perforation or abscess without bleeding: Secondary | ICD-10-CM | POA: Insufficient documentation

## 2012-04-18 DIAGNOSIS — I10 Essential (primary) hypertension: Secondary | ICD-10-CM | POA: Insufficient documentation

## 2012-04-18 DIAGNOSIS — Z881 Allergy status to other antibiotic agents status: Secondary | ICD-10-CM | POA: Insufficient documentation

## 2012-04-18 DIAGNOSIS — Z885 Allergy status to narcotic agent status: Secondary | ICD-10-CM | POA: Insufficient documentation

## 2012-04-18 DIAGNOSIS — Z87891 Personal history of nicotine dependence: Secondary | ICD-10-CM | POA: Insufficient documentation

## 2012-04-18 DIAGNOSIS — F3289 Other specified depressive episodes: Secondary | ICD-10-CM | POA: Insufficient documentation

## 2012-04-18 DIAGNOSIS — F329 Major depressive disorder, single episode, unspecified: Secondary | ICD-10-CM | POA: Insufficient documentation

## 2012-04-18 DIAGNOSIS — F1910 Other psychoactive substance abuse, uncomplicated: Secondary | ICD-10-CM | POA: Insufficient documentation

## 2012-04-18 DIAGNOSIS — M549 Dorsalgia, unspecified: Secondary | ICD-10-CM | POA: Insufficient documentation

## 2012-04-18 DIAGNOSIS — Z8489 Family history of other specified conditions: Secondary | ICD-10-CM | POA: Insufficient documentation

## 2012-04-18 MED ORDER — TRAMADOL HCL 50 MG PO TABS: 50.0000 mg | ORAL_TABLET | Freq: Four times a day (QID) | ORAL | Status: DC | PRN

## 2012-04-18 NOTE — ED Notes (Signed)
Pt reports chronic back pain that has worse x 1 week.

## 2012-04-18 NOTE — ED Notes (Signed)
Pt reports "I ran out of my medicine for chronic back pain." Pt reports pain 8/10 to lower back radiating down both legs and into feet.  Pt reports falling over a year ago with a "fracture to my tailbone."  Pt reports current medication works for pain, but needs a refill.

## 2012-04-18 NOTE — ED Provider Notes (Signed)
History     CSN: [id number]  Arrival date & time 04/18/12  1308   First MD Initiated Contact with Patient 04/18/12 1321      Chief Complaint  Patient presents with  . Back Pain    (Consider location/radiation/quality/duration/timing/severity/associated sxs/prior treatment) HPI Comments: Patient presents with history of chronic back pain that began after a fall one year ago. Patient states that he has recurrence of his back pain as he recently ran out of his tramadol. _Pain described as dull and sharp. No new injuries. Pain is the same as in the past. Patient endorses no red flag signs and symptoms of lower back pain. Patient is currently at [facility] and plans to be there for the next 1-3 months. He has been taking low dose of ibuprofen with mild relief. Onset gradual. Course is constant. Movement makes pain worse.  Patient is a 46 y.o. male presenting with back pain. The history is provided by the patient.  Back Pain  This is a chronic problem. The current episode started more than 1 week ago. The problem occurs constantly. The problem has not changed since onset.The pain is present in the lumbar spine. The quality of the pain is described as aching. The pain does not radiate. The pain is mild. The symptoms are aggravated by bending, twisting and certain positions. Pertinent negatives include no fever, no numbness, no weight loss, no abdominal pain, no perianal numbness, no bladder incontinence, no dysuria, no tingling and no weakness. He has tried NSAIDs for the symptoms. The treatment provided mild relief.    Past Medical History  Diagnosis Date  . Hypertension   . Depressive disorder, not elsewhere classified   . Osteoarthrosis, unspecified whether generalized or localized, unspecified site   . Irritable bowel syndrome   . Esophageal reflux   . Colon polyps     hyperplastic/  . Allergic rhinitis   . Pancreatitis   . History of pneumonia   . Insomnia   . Anxiety   .  Diverticulitis   . Alcohol abuse 2012  . Benzodiazepine abuse     Past Surgical History  Procedure Date  . Tibula surgery     right  . Fibula fracture surgery     right    Family History  Problem Relation Age of Onset  . Heart disease Mother   . Gallbladder disease Mother   . Nephrolithiasis Mother   . Colon cancer Neg Hx   . Hypertension Father     moth  . Arthritis Other   . Hyperlipidemia Other   . Stroke Other     History  Substance Use Topics  . Smoking status: Former Smoker -- 1.5 packs/day for 18 years    Types: Cigarettes    Quit date: 08/28/1991  . Smokeless tobacco: Current User    Types: Chew  . Alcohol Use: Yes     everyday 1/2 to 1 pint  daily- last drink 1 month ago      Review of Systems  Constitutional: Negative for fever, weight loss and unexpected weight change.  Gastrointestinal: Negative for abdominal pain and constipation.       Neg for fecal incontinence  Genitourinary: Negative for bladder incontinence, dysuria, hematuria, flank pain and difficulty urinating.       Negative for urinary incontinence or retention  Musculoskeletal: Positive for back pain.  Neurological: Negative for tingling, weakness and numbness.       Negative for saddle paresthesias     Allergies  Codeine;  Vicodin; Benazepril; Ativan; Corticosteroids; Lorazepam; and Prednisone  Home Medications   Current Outpatient Rx  Name Route Sig Dispense Refill  . CLONIDINE HCL 0.1 MG PO TABS Oral Take 0.1 mg by mouth at bedtime.    Marland Kitchen HYDROXYZINE HCL 25 MG PO TABS Oral Take 25 mg by mouth 3 (three) times daily as needed.    . ASPIRIN 81 MG PO TABS Oral Take 81 mg by mouth daily.    . ATENOLOL 50 MG PO TABS  Take one tablet (50mg ) by mouth daily in am and with evening meal, for high blood pressure. 60 tablet 0  . CARBAMAZEPINE 200 MG PO TABS Oral Take 200 mg by mouth 2 (two) times daily.    Marland Kitchen FLUOXETINE HCL 40 MG PO CAPS Oral Take 40 mg by mouth daily.    Marland Kitchen HYDROCHLOROTHIAZIDE  25 MG PO TABS Oral Take 1 tablet (25 mg total) by mouth daily. For high blood pressure 30 tablet 0  . IBUPROFEN 200 MG PO TABS Oral Take 200 mg by mouth every 6 (six) hours as needed. For pain    . ADULT MULTIVITAMIN W/MINERALS CH Oral Take 1 tablet by mouth daily. Vitamin supplement    . OMEPRAZOLE 40 MG PO CPDR  Take 1 capsule in the morning and 1 capsule in the evening for Acid Reflux. 60 capsule 3  . TRAMADOL HCL 50 MG PO TABS Oral Take 50 mg by mouth every 6 (six) hours as needed. For pain    . TRAMADOL HCL 50 MG PO TABS Oral Take 1 tablet (50 mg total) by mouth every 6 (six) hours as needed for pain. 15 tablet 0    BP 149/93  Pulse 71  Temp 98.2 F (36.8 C) (Oral)  Resp 16  Ht 5\' 2"  (1.575 m)  Wt 160 lb (72.576 kg)  BMI 29.26 kg/m2  SpO2 100%  Physical Exam  Nursing note and vitals reviewed. Constitutional: He is oriented to person, place, and time. He appears well-developed and well-nourished.  HENT:  Head: Normocephalic and atraumatic.  Eyes: Conjunctivae are normal.  Neck: Normal range of motion.  Abdominal: Soft. There is no tenderness. There is no CVA tenderness.  Musculoskeletal: He exhibits no tenderness.       Right hip: Normal.       Left hip: Normal.       Cervical back: He exhibits normal range of motion and no tenderness.       Thoracic back: He exhibits normal range of motion and no tenderness.       Lumbar back: He exhibits tenderness. He exhibits normal range of motion and no bony tenderness.       Back:       There is no tenderness to palpation over cervical/thoracic/lumbar/sacral spine. Tenderness to palpation over cervical/thoracic/lumbar paraspinal muscles. No step-off noted with palpation of spine.   Neurological: He is alert and oriented to person, place, and time. He has normal reflexes. No sensory deficit. He exhibits normal muscle tone.       5/5 strength in entire lower extremities bilaterally. No sensation deficit.   Skin: Skin is warm and dry.    Psychiatric: He has a normal mood and affect.    ED Course  Procedures (including critical care time)  Labs Reviewed - No data to display No results found.   1. Back pain    2:02 PM Patient seen and examined.    Vital signs reviewed and are as follows: Filed Vitals:   04/18/12  1317  BP: 149/93  Pulse: 71  Temp: 98.2 F (36.8 C)  Resp: 16   Patient was seen and examined. No red flag s/s of low back pain. Patient was counseled on back pain precautions and told to do activity as tolerated but do not lift, push, or pull heavy objects more than 10 pounds for the next week.  Patient counseled to use ice or heat on back for no longer than 15 minutes every hour.   Patient prescribed narcotic pain medicine and counseled on proper use of narcotic pain medications. Counseled not to combine this medication with others containing tylenol.   Urged patient not to drink alcohol, drive, or perform any other activities that requires focus while taking either of these medications.  Patient urged to follow-up with PCP if pain does not improve with treatment and rest or if pain becomes recurrent. Urged to return with worsening severe pain, loss of bowel or bladder control, trouble walking.   The patient verbalizes understanding and agrees with the plan.  MDM  Patient with back pain. No neurological deficits. Patient is ambulatory. No warning symptoms of back pain including: loss of bowel or bladder control, night sweats, waking from sleep with back pain, unexplained fevers or weight loss, h/o cancer, IVDU, recent trauma. No concern for cauda equina, epidural abscess, or other serious cause of back pain. Conservative measures such as rest, ice/heat and pain medicine indicated with PCP follow-up if no improvement with conservative management.          Renne Crigler, [place] 04/18/12 1407

## 2012-04-18 NOTE — ED Provider Notes (Signed)
Medical screening examination/treatment/procedure(s) were performed by non-physician practitioner and as supervising physician I was immediately available for consultation/collaboration.   Charles B. Bernette Mayers, MD 04/18/12 [phone]

## 2012-04-24 ENCOUNTER — Encounter (HOSPITAL_BASED_OUTPATIENT_CLINIC_OR_DEPARTMENT_OTHER): Payer: Self-pay | Admitting: *Deleted

## 2012-04-24 ENCOUNTER — Emergency Department (HOSPITAL_BASED_OUTPATIENT_CLINIC_OR_DEPARTMENT_OTHER)
Admission: EM | Admit: 2012-04-24 | Discharge: 2012-04-24 | Disposition: A | Discharge status: Home / Self Care | Payer: Self-pay | Attending: Emergency Medicine | Admitting: Emergency Medicine

## 2012-04-24 DIAGNOSIS — F419 Anxiety disorder, unspecified: Secondary | ICD-10-CM

## 2012-04-24 DIAGNOSIS — K859 Acute pancreatitis without necrosis or infection, unspecified: Secondary | ICD-10-CM | POA: Insufficient documentation

## 2012-04-24 DIAGNOSIS — I10 Essential (primary) hypertension: Secondary | ICD-10-CM | POA: Insufficient documentation

## 2012-04-24 DIAGNOSIS — F411 Generalized anxiety disorder: Secondary | ICD-10-CM | POA: Insufficient documentation

## 2012-04-24 DIAGNOSIS — G4700 Insomnia, unspecified: Secondary | ICD-10-CM | POA: Insufficient documentation

## 2012-04-24 DIAGNOSIS — M1990 Unspecified osteoarthritis, unspecified site: Secondary | ICD-10-CM | POA: Insufficient documentation

## 2012-04-24 DIAGNOSIS — Z87891 Personal history of nicotine dependence: Secondary | ICD-10-CM | POA: Insufficient documentation

## 2012-04-24 DIAGNOSIS — K219 Gastro-esophageal reflux disease without esophagitis: Secondary | ICD-10-CM | POA: Insufficient documentation

## 2012-04-24 MED ORDER — FLUOXETINE HCL 20 MG PO TABS: 60.0000 mg | ORAL_TABLET | Freq: Every morning | ORAL | Status: DC

## 2012-04-24 MED ORDER — TRAMADOL HCL 50 MG PO TABS: 50.0000 mg | ORAL_TABLET | Freq: Four times a day (QID) | ORAL | Status: AC | PRN

## 2012-04-24 NOTE — ED Notes (Signed)
Shaking x 3 weeks. States he has been at [facility] where he is being treated for Valium and alcohol addiction.for the past 2 weeks. States he has been diagnosed with anxiety and is being given medication for at drug rehab facility for same.

## 2012-04-24 NOTE — ED Provider Notes (Addendum)
History     CSN: [id number]  Arrival date & time 04/24/12  1451   First MD Initiated Contact with Patient 04/24/12 1514      Chief Complaint  Patient presents with  . Shaking    (Consider location/radiation/quality/duration/timing/severity/associated sxs/prior treatment) Patient is a 46 y.o. male presenting with anxiety. The history is provided by the patient.  Anxiety This is a chronic problem. Episode onset: worse of the last month since he has been off valium and alcohol. The problem occurs constantly. The problem has not changed since onset.Pertinent negatives include no chest pain and no shortness of breath. Associated symptoms comments: Sweaty hands, racing thoughts, nervous every day. Nothing aggravates the symptoms. Relieved by: gets better at night when he takes prozac and clonidine. He has tried rest for the symptoms. The treatment provided no relief.    Past Medical History  Diagnosis Date  . Hypertension   . Depressive disorder, not elsewhere classified   . Osteoarthrosis, unspecified whether generalized or localized, unspecified site   . Irritable bowel syndrome   . Esophageal reflux   . Colon polyps     hyperplastic/  . Allergic rhinitis   . Pancreatitis   . History of pneumonia   . Insomnia   . Anxiety   . Diverticulitis   . Alcohol abuse 2012  . Benzodiazepine abuse     Past Surgical History  Procedure Date  . Tibula surgery     right  . Fibula fracture surgery     right    Family History  Problem Relation Age of Onset  . Heart disease Mother   . Gallbladder disease Mother   . Nephrolithiasis Mother   . Colon cancer Neg Hx   . Hypertension Father     moth  . Arthritis Other   . Hyperlipidemia Other   . Stroke Other     History  Substance Use Topics  . Smoking status: Former Smoker -- 1.5 packs/day for 18 years    Types: Cigarettes    Quit date: 08/28/1991  . Smokeless tobacco: Current User    Types: Chew  . Alcohol Use: Yes   everyday 1/2 to 1 pint  daily- last drink 1 month ago      Review of Systems  Constitutional: Negative for fever.  Respiratory: Negative for cough and shortness of breath.   Cardiovascular: Negative for chest pain and leg swelling.  All other systems reviewed and are negative.    Allergies  Codeine; Vicodin; Benazepril; Ativan; Corticosteroids; Lorazepam; and Prednisone  Home Medications   Current Outpatient Rx  Name Route Sig Dispense Refill  . ASPIRIN 81 MG PO TABS Oral Take 81 mg by mouth daily.    . ATENOLOL 50 MG PO TABS  Take one tablet (50mg ) by mouth daily in am and with evening meal, for high blood pressure. 60 tablet 0  . CARBAMAZEPINE 200 MG PO TABS Oral Take 200 mg by mouth 2 (two) times daily.    Marland Kitchen CLONIDINE HCL 0.1 MG PO TABS Oral Take 0.1 mg by mouth at bedtime.    . FLUOXETINE HCL 40 MG PO CAPS Oral Take 40 mg by mouth daily.    Marland Kitchen HYDROCHLOROTHIAZIDE 25 MG PO TABS Oral Take 1 tablet (25 mg total) by mouth daily. For high blood pressure 30 tablet 0  . HYDROXYZINE HCL 25 MG PO TABS Oral Take 25 mg by mouth 3 (three) times daily as needed.    . IBUPROFEN 200 MG PO TABS Oral Take  200 mg by mouth every 6 (six) hours as needed. For pain    . ADULT MULTIVITAMIN W/MINERALS CH Oral Take 1 tablet by mouth daily. Vitamin supplement    . OMEPRAZOLE 40 MG PO CPDR  Take 1 capsule in the morning and 1 capsule in the evening for Acid Reflux. 60 capsule 3  . TRAMADOL HCL 50 MG PO TABS Oral Take 50 mg by mouth every 6 (six) hours as needed. For pain    . TRAMADOL HCL 50 MG PO TABS Oral Take 1 tablet (50 mg total) by mouth every 6 (six) hours as needed for pain. 15 tablet 0    BP 129/75  Pulse 57  Temp 99.5 F (37.5 C) (Oral)  Resp 20  SpO2 99%  Physical Exam  Nursing note and vitals reviewed. Constitutional: He is oriented to person, place, and time. He appears well-developed and well-nourished. No distress.  HENT:  Head: Normocephalic and atraumatic.  Mouth/Throat:  Oropharynx is clear and moist.  Eyes: Conjunctivae and EOM are normal. Pupils are equal, round, and reactive to light.  Neck: Normal range of motion. Neck supple.  Cardiovascular: Normal rate, regular rhythm and intact distal pulses.   No murmur heard. Pulmonary/Chest: Effort normal and breath sounds normal. No respiratory distress. He has no wheezes. He has no rales.  Abdominal: Soft. He exhibits no distension. There is no tenderness. There is no rebound and no guarding.  Musculoskeletal: Normal range of motion. He exhibits no edema and no tenderness.  Neurological: He is alert and oriented to person, place, and time.  Skin: Skin is warm and dry. No rash noted. No erythema.  Psychiatric: His behavior is normal. His mood appears anxious. He does not exhibit a depressed mood. He expresses no homicidal and no suicidal ideation.    ED Course  Procedures (including critical care time)  Labs Reviewed - No data to display No results found.   1. Anxiety       MDM   Patient here complaining of generalized anxiety throughout the day. He is currently at a detox facility where he is being treated for benzoin alcohol addiction. He is asking for something to help with pain in his anxiety that is non-benzodiazepine. He does take 40 mg of Prozac at night which he states is helping some but he still feels anxious during the day. Recommend that we increase the patient's Prozac to 60 mg daily and to be taken in the morning. He's been on this dose of Prozac for over a month so feel it is reasonable to increase to 60. He will followup with his doctor in 2 weeks if this is not helping and possibly be weaned off the Prozac and start another antidepressant versus additional therapy.        Gwyneth Sprout, MD 04/24/12 1542  Gwyneth Sprout, MD 04/24/12 [phone]

## 2012-05-13 ENCOUNTER — Ambulatory Visit (INDEPENDENT_AMBULATORY_CARE_PROVIDER_SITE_OTHER): Payer: Self-pay | Admitting: Endocrinology

## 2012-05-13 ENCOUNTER — Encounter: Payer: Self-pay | Admitting: Endocrinology

## 2012-05-13 VITALS — BP 128/88 | HR 77 | Temp 98.8°F | Wt 161.0 lb

## 2012-05-13 DIAGNOSIS — J069 Acute upper respiratory infection, unspecified: Secondary | ICD-10-CM

## 2012-05-13 MED ORDER — TRAMADOL HCL 50 MG PO TABS: 50.0000 mg | ORAL_TABLET | Freq: Four times a day (QID) | ORAL | Status: DC | PRN

## 2012-05-13 MED ORDER — CEFUROXIME AXETIL 250 MG PO TABS: 250.0000 mg | ORAL_TABLET | Freq: Two times a day (BID) | ORAL | Status: AC

## 2012-05-13 NOTE — Patient Instructions (Addendum)
i have sent a prescription to your pharmacy, for an antibiotic i have also refilled the tramadol.  This will help your cough, also. Loratadine (non-prescription) will help your runny nose. here is a sample of "advair-100."  take 1 puff 2x a day.  rinse mouth after using. I hope you feel better soon.  If you don't feel better by next week, please call back.  Please call sooner if you get worse.

## 2012-05-13 NOTE — Progress Notes (Signed)
Subjective:    Patient ID: Presley Raddle, male    DOB: 02-Mar-1966, 46 y.o.   MRN: [id number]  HPI Pt states few days moderate of prod-quality cough in the chest, and assoc sore throat Past Medical History  Diagnosis Date  . Hypertension   . Depressive disorder, not elsewhere classified   . Osteoarthrosis, unspecified whether generalized or localized, unspecified site   . Irritable bowel syndrome   . Esophageal reflux   . Colon polyps     hyperplastic/  . Allergic rhinitis   . Pancreatitis   . History of pneumonia   . Insomnia   . Anxiety   . Diverticulitis   . Alcohol abuse 2012  . Benzodiazepine abuse     Past Surgical History  Procedure Date  . Tibula surgery     right  . Fibula fracture surgery     right    History   Social History  . Marital Status: Divorced    Spouse Name: N/A    Number of Children: 1  . Years of Education: N/A   Occupational History  . unemployed    Social History Main Topics  . Smoking status: Former Smoker -- 1.5 packs/day for 18 years    Types: Cigarettes    Quit date: 08/28/1991  . Smokeless tobacco: Current User    Types: Chew  . Alcohol Use: Yes     everyday 1/2 to 1 pint  daily- last drink 1 month ago  . Drug Use: Yes    Special: Marijuana     benzos- last used 1 month ago  . Sexually Active: Yes   Other Topics Concern  . Not on file   Social History Narrative  . No narrative on file    Current Outpatient Prescriptions on File Prior to Visit  Medication Sig Dispense Refill  . aspirin 81 MG tablet Take 81 mg by mouth daily.      Marland Kitchen atenolol (TENORMIN) 50 MG tablet Take one tablet (50mg ) by mouth daily in am and with evening meal, for high blood pressure.  60 tablet  0  . carbamazepine (TEGRETOL) 200 MG tablet Take 200 mg by mouth 2 (two) times daily.      . cloNIDine (CATAPRES) 0.1 MG tablet Take 0.1 mg by mouth at bedtime.      Marland Kitchen FLUoxetine (PROZAC) 20 MG tablet Take 3 tablets (60 mg total) by mouth AC breakfast.  30  tablet  3  . FLUoxetine (PROZAC) 40 MG capsule Take 40 mg by mouth daily.      . hydrochlorothiazide (HYDRODIURIL) 25 MG tablet Take 1 tablet (25 mg total) by mouth daily. For high blood pressure  30 tablet  0  . hydrOXYzine (ATARAX/VISTARIL) 25 MG tablet Take 25 mg by mouth 3 (three) times daily as needed. For itching      . ibuprofen (ADVIL,MOTRIN) 200 MG tablet Take 200 mg by mouth every 6 (six) hours as needed. For pain      . Multiple Vitamin (MULITIVITAMIN WITH MINERALS) TABS Take 1 tablet by mouth daily. Vitamin supplement      . omeprazole (PRILOSEC) 40 MG capsule Take 1 capsule in the morning and 1 capsule in the evening for Acid Reflux.  60 capsule  3    Allergies  Allergen Reactions  . Codeine Anaphylaxis  . Vicodin (Hydrocodone-Acetaminophen) Anaphylaxis  . Benazepril Cough  . Ativan (Lorazepam) Other (See Comments)    Hallucinations  . Corticosteroids Other (See Comments)    Shaky and  high blood pressure  . Lorazepam Other (See Comments)    Becomes violent and hallucinates  . Prednisone Other (See Comments)    High blood pressure and shaky     Family History  Problem Relation Age of Onset  . Heart disease Mother   . Gallbladder disease Mother   . Nephrolithiasis Mother   . Colon cancer Neg Hx   . Hypertension Father     moth  . Arthritis Other   . Hyperlipidemia Other   . Stroke Other     BP 128/88  Pulse 77  Temp 98.8 F (37.1 C) (Oral)  Wt 161 lb (73.029 kg)  SpO2 93%    Review of Systems He has slight wheezing, but fever is resolved.     Objective:   Physical Exam VITAL SIGNS:  See vs page GENERAL: no distress head: no deformity eyes: no periorbital swelling, no proptosis external nose and ears are normal mouth: no lesion seen Both tm's are slightly red LUNGS:  Clear to auscultation, except for a few wheezes.          Assessment & Plan:  Glenford Peers, new

## 2012-05-29 ENCOUNTER — Emergency Department (HOSPITAL_COMMUNITY)
Admission: EM | Admit: 2012-05-29 | Discharge: 2012-05-29 | Disposition: A | Discharge status: Home / Self Care | Payer: Self-pay | Attending: Emergency Medicine | Admitting: Emergency Medicine

## 2012-05-29 ENCOUNTER — Emergency Department (HOSPITAL_COMMUNITY): Payer: Self-pay

## 2012-05-29 ENCOUNTER — Encounter (HOSPITAL_COMMUNITY): Payer: Self-pay | Admitting: Emergency Medicine

## 2012-05-29 DIAGNOSIS — Z7982 Long term (current) use of aspirin: Secondary | ICD-10-CM | POA: Insufficient documentation

## 2012-05-29 DIAGNOSIS — S93409A Sprain of unspecified ligament of unspecified ankle, initial encounter: Secondary | ICD-10-CM | POA: Insufficient documentation

## 2012-05-29 DIAGNOSIS — I10 Essential (primary) hypertension: Secondary | ICD-10-CM | POA: Insufficient documentation

## 2012-05-29 DIAGNOSIS — M25476 Effusion, unspecified foot: Secondary | ICD-10-CM | POA: Insufficient documentation

## 2012-05-29 DIAGNOSIS — S6390XA Sprain of unspecified part of unspecified wrist and hand, initial encounter: Secondary | ICD-10-CM | POA: Insufficient documentation

## 2012-05-29 DIAGNOSIS — W19XXXA Unspecified fall, initial encounter: Secondary | ICD-10-CM | POA: Insufficient documentation

## 2012-05-29 DIAGNOSIS — Z79899 Other long term (current) drug therapy: Secondary | ICD-10-CM | POA: Insufficient documentation

## 2012-05-29 DIAGNOSIS — Y9289 Other specified places as the place of occurrence of the external cause: Secondary | ICD-10-CM | POA: Insufficient documentation

## 2012-05-29 DIAGNOSIS — M25473 Effusion, unspecified ankle: Secondary | ICD-10-CM | POA: Insufficient documentation

## 2012-05-29 DIAGNOSIS — S93401A Sprain of unspecified ligament of right ankle, initial encounter: Secondary | ICD-10-CM

## 2012-05-29 DIAGNOSIS — S6391XA Sprain of unspecified part of right wrist and hand, initial encounter: Secondary | ICD-10-CM

## 2012-05-29 MED ORDER — TRAMADOL HCL 50 MG PO TABS: 50.0000 mg | ORAL_TABLET | Freq: Four times a day (QID) | ORAL | Status: DC | PRN

## 2012-05-29 MED ORDER — NAPROXEN 375 MG PO TABS: 375.0000 mg | ORAL_TABLET | Freq: Two times a day (BID) | ORAL | Status: DC

## 2012-05-29 NOTE — ED Notes (Signed)
Pt reports that he stepped into hole in the yard and twisted his r/leg

## 2012-05-29 NOTE — ED Provider Notes (Signed)
Medical screening examination/treatment/procedure(s) were performed by non-physician practitioner and as supervising physician I was immediately available for consultation/collaboration.   Dione Booze, MD 05/29/12 [phone]

## 2012-05-29 NOTE — ED Provider Notes (Signed)
History     CSN: [id number]  Arrival date & time 05/29/12  1106   First MD Initiated Contact with Patient 05/29/12 1112      Chief Complaint  Patient presents with  . Fall  . Ankle Pain    r/leg and leg pain, strpped into a hole 20 hrs ago and twisted leg    (Consider location/radiation/quality/duration/timing/severity/associated sxs/prior treatment) HPI Comments: Martin Singh is a 46 y.o. Male who presents after a fall. States stepped into a sewer hole yesterday evening. States fell to the ground. Pain to the right ankle and right hand. No head injury. No other complaints. Hx of right tib fit fracture with surgical repair several years ago. States worried about it. Denies taking any medications, states did put ice pack on it, pain not improving this morning. Here for evaluation.   The history is provided by the patient.    Past Medical History  Diagnosis Date  . Hypertension   . Depressive disorder, not elsewhere classified   . Osteoarthrosis, unspecified whether generalized or localized, unspecified site   . Irritable bowel syndrome   . Esophageal reflux   . Colon polyps     hyperplastic/  . Allergic rhinitis   . Pancreatitis   . History of pneumonia   . Insomnia   . Anxiety   . Diverticulitis   . Alcohol abuse 2012  . Benzodiazepine abuse     Past Surgical History  Procedure Date  . Tibula surgery     right  . Fibula fracture surgery     right    Family History  Problem Relation Age of Onset  . Heart disease Mother   . Gallbladder disease Mother   . Nephrolithiasis Mother   . Colon cancer Neg Hx   . Hypertension Father     moth  . Arthritis Other   . Hyperlipidemia Other   . Stroke Other     History  Substance Use Topics  . Smoking status: Former Smoker -- 1.5 packs/day for 18 years    Types: Cigarettes    Quit date: 08/28/1991  . Smokeless tobacco: Current User    Types: Chew  . Alcohol Use: No     everyday 1/2 to 1 pint  daily- last drink 1  month ago      Review of Systems  Constitutional: Negative for fever and chills.  HENT: Negative for neck pain and neck stiffness.   Respiratory: Negative.   Cardiovascular: Negative.   Gastrointestinal: Negative for abdominal pain.  Musculoskeletal: Positive for joint swelling and arthralgias.  Skin: Negative.   Neurological: Negative for weakness, numbness and headaches.    Allergies  Codeine; Vicodin; Benazepril; Ativan; Corticosteroids; Lorazepam; and Prednisone  Home Medications   Current Outpatient Rx  Name Route Sig Dispense Refill  . ASPIRIN 81 MG PO TABS Oral Take 81 mg by mouth daily.    . ATENOLOL 50 MG PO TABS  Take one tablet (50mg ) by mouth daily in am and with evening meal, for high blood pressure. 60 tablet 0  . CARBAMAZEPINE 200 MG PO TABS Oral Take 200 mg by mouth 2 (two) times daily.    Marland Kitchen CLONIDINE HCL 0.1 MG PO TABS Oral Take 0.1 mg by mouth at bedtime.    . FLUOXETINE HCL 20 MG PO TABS Oral Take 3 tablets (60 mg total) by mouth AC breakfast. 30 tablet 3  . FLUOXETINE HCL 40 MG PO CAPS Oral Take 40 mg by mouth daily.    Marland Kitchen  HYDROCHLOROTHIAZIDE 25 MG PO TABS Oral Take 1 tablet (25 mg total) by mouth daily. For high blood pressure 30 tablet 0  . HYDROXYZINE HCL 25 MG PO TABS Oral Take 25 mg by mouth 3 (three) times daily as needed. For itching    . IBUPROFEN 200 MG PO TABS Oral Take 200 mg by mouth every 6 (six) hours as needed. For pain    . ADULT MULTIVITAMIN W/MINERALS CH Oral Take 1 tablet by mouth daily. Vitamin supplement    . OMEPRAZOLE 40 MG PO CPDR  Take 1 capsule in the morning and 1 capsule in the evening for Acid Reflux. 60 capsule 3  . TRAMADOL HCL 50 MG PO TABS Oral Take 1 tablet (50 mg total) by mouth every 6 (six) hours as needed. For pain or cough 50 tablet 3    BP 127/84  Pulse 88  Resp 18  Wt 160 lb (72.576 kg)  SpO2 97%  Physical Exam  Nursing note and vitals reviewed. Constitutional: He appears well-developed and well-nourished. No  distress.  Eyes: Conjunctivae normal are normal.  Neck: Neck supple.  Cardiovascular: Normal rate, regular rhythm and normal heart sounds.   Pulmonary/Chest: Effort normal and breath sounds normal. No respiratory distress. He has no wheezes. He has no rales.  Musculoskeletal:       Right ankle mild swelling noted, mainly over medial malleolus. Tender to palpation over bilateral malleoli and anterior joint. Normal foot. Pain with ankle dorsiflexion and platar flexion, inversion and eversion. Joint stable. Normal knee. Right hand normal appearing with no bruising, swelling, deformity. Tender to the 4th and 5th metacarpals. Pain with 4th and 5th finger movement.   Neurological: He is alert.  Skin: Skin is warm and dry.  Psychiatric: He has a normal mood and affect.    ED Course  Procedures (including critical care time)  PT with mechanical fall, pain to ankle and hand. X-rays pending.   Dg Ankle Complete Right  05/29/2012  *RADIOLOGY REPORT*  Clinical Data: ankle pain.  Fall  RIGHT ANKLE - COMPLETE 3+ VIEW  Comparison: None.  Findings:  There is an old healed fracture deformity involving the distal aspect of the tibia.  Two orthopedic screws are identified within the metaphysis of the distal tibia.  There is a side plate and screw device within the distal fibula.  No acute fractures or subluxations identified.  IMPRESSION:  1.  No acute findings. 2.  Previous hardware fixation of the distal fibula and tibia.   Original Report Authenticated By: Rosealee Albee, M.D.    Dg Hand Complete Right  05/29/2012  *RADIOLOGY REPORT*  Clinical Data: Status post fall.  Pain.  RIGHT HAND - COMPLETE 3+ VIEW  Comparison: None.  Findings: Imaged bones, joints and soft tissues appear normal.  IMPRESSION: Negative study.   Original Report Authenticated By: Bernadene Bell. D'ALESSIO, M.D.     1:05 PM Negative x-ray. Suspect a sprain. Pt has a brace and crutches at home. Will do ACE on the hand. Follow up with  orthopedics  1. Right ankle sprain   2. Sprain of hand, right       MDM          Lottie Mussel, PA 05/29/12 1538

## 2012-06-10 ENCOUNTER — Encounter (HOSPITAL_COMMUNITY): Payer: Self-pay | Admitting: Emergency Medicine

## 2012-06-10 ENCOUNTER — Emergency Department (HOSPITAL_COMMUNITY)
Admission: EM | Admit: 2012-06-10 | Discharge: 2012-06-10 | Disposition: A | Discharge status: Home / Self Care | Payer: Self-pay | Attending: Emergency Medicine | Admitting: Emergency Medicine

## 2012-06-10 ENCOUNTER — Emergency Department (HOSPITAL_COMMUNITY): Payer: Self-pay

## 2012-06-10 DIAGNOSIS — T887XXA Unspecified adverse effect of drug or medicament, initial encounter: Secondary | ICD-10-CM | POA: Insufficient documentation

## 2012-06-10 DIAGNOSIS — Z7982 Long term (current) use of aspirin: Secondary | ICD-10-CM | POA: Insufficient documentation

## 2012-06-10 DIAGNOSIS — I10 Essential (primary) hypertension: Secondary | ICD-10-CM | POA: Insufficient documentation

## 2012-06-10 DIAGNOSIS — T50905A Adverse effect of unspecified drugs, medicaments and biological substances, initial encounter: Secondary | ICD-10-CM

## 2012-06-10 DIAGNOSIS — Y92009 Unspecified place in unspecified non-institutional (private) residence as the place of occurrence of the external cause: Secondary | ICD-10-CM | POA: Insufficient documentation

## 2012-06-10 DIAGNOSIS — Z79899 Other long term (current) drug therapy: Secondary | ICD-10-CM | POA: Insufficient documentation

## 2012-06-10 LAB — POCT I-STAT, CHEM 8
Calcium, Ion: 1.24 mmol/L — ABNORMAL HIGH (ref 1.12–1.23)
Chloride: 104 mEq/L (ref 96–112)
Glucose, Bld: 100 mg/dL — ABNORMAL HIGH (ref 70–99)
HCT: 39 % (ref 39.0–52.0)
TCO2: 27 mmol/L (ref 0–100)

## 2012-06-10 MED ORDER — NAPROXEN 375 MG PO TABS: 375.0000 mg | ORAL_TABLET | Freq: Two times a day (BID) | ORAL | Status: DC

## 2012-06-10 NOTE — ED Notes (Signed)
Pt states that his whole body got stiff last night around 0300 am and he started having "a seizure".  Pt states that he remembers it and it "woke him up".  Pt is on tegretol and risperdal.  Started these meds on 06/06/12.

## 2012-06-10 NOTE — ED Provider Notes (Signed)
Medical screening examination/treatment/procedure(s) were performed by non-physician practitioner and as supervising physician I was immediately available for consultation/collaboration.   Charles B. Bernette Mayers, MD 06/10/12 1601

## 2012-06-10 NOTE — ED Provider Notes (Signed)
History     CSN: [id number]  Arrival date & time 06/10/12  1234   First MD Initiated Contact with Patient 06/10/12 1320      Chief Complaint  Patient presents with  . Seizures    (Consider location/radiation/quality/duration/timing/severity/associated sxs/prior treatment) HPI  Pt has come to the ED for evaluation after a "seizure life" episode last night. He says it woke him up from his sleep, he became stiff like a board and then shot up and ran to tell his room mate that he had a seizure. He had no incontinence of urine or bowel. He did not have a post ictal phase. He has a history of seizures for which he takes Tegretol and Lamictal. His Psychiatrist prescribe his seizure medications and his care taker/ friend tells me he has not seen a neurologist. He quite drinking alcohol 4 months ago and denies a relapse in drinking of using any substances. NAD/VSS  Past Medical History  Diagnosis Date  . Hypertension   . Depressive disorder, not elsewhere classified   . Osteoarthrosis, unspecified whether generalized or localized, unspecified site   . Irritable bowel syndrome   . Esophageal reflux   . Colon polyps     hyperplastic/  . Allergic rhinitis   . Pancreatitis   . History of pneumonia   . Insomnia   . Anxiety   . Diverticulitis   . Alcohol abuse 2012  . Benzodiazepine abuse     Past Surgical History  Procedure Date  . Tibula surgery     right  . Fibula fracture surgery     right    Family History  Problem Relation Age of Onset  . Heart disease Mother   . Gallbladder disease Mother   . Nephrolithiasis Mother   . Colon cancer Neg Hx   . Hypertension Father     moth  . Arthritis Other   . Hyperlipidemia Other   . Stroke Other     History  Substance Use Topics  . Smoking status: Former Smoker -- 1.5 packs/day for 18 years    Types: Cigarettes    Quit date: 08/28/1991  . Smokeless tobacco: Current User    Types: Chew  . Alcohol Use: No     everyday 1/2  to 1 pint  daily- last drink 1 month ago      Review of Systems   Review of Systems  Gen: no weight loss, fevers, chills, night sweats  Eyes: no discharge or drainage, no occular pain or visual changes  Nose: no epistaxis or rhinorrhea  Mouth: no dental pain, no sore throat  Neck: no neck pain  Lungs:No wheezing, coughing or hemoptysis CV: no chest pain, palpitations, dependent edema or orthopnea  Abd: no abdominal pain, nausea, vomiting  GU: no dysuria or gross hematuria  MSK:  No abnormalities  Neuro: no headache, no focal neurologic deficits  Skin: no abnormalities Psyche: negative.    Allergies  Codeine; Vicodin; Benazepril; Ativan; Corticosteroids; Lorazepam; and Prednisone  Home Medications   Current Outpatient Rx  Name Route Sig Dispense Refill  . ALBUTEROL SULFATE HFA 108 (90 BASE) MCG/ACT IN AERS Inhalation Inhale 2 puffs into the lungs every 6 (six) hours as needed. For shortness of breath.    . ASPIRIN 81 MG PO CHEW Oral Chew 81 mg by mouth daily.    . ATENOLOL 50 MG PO TABS Oral Take 50 mg by mouth 2 (two) times daily.    Marland Kitchen CARBAMAZEPINE 200 MG PO TABS Oral Take  200 mg by mouth 2 (two) times daily.    Marland Kitchen CLONIDINE HCL 0.1 MG PO TABS Oral Take 0.1 mg by mouth at bedtime.    . FLUOXETINE HCL 20 MG PO TABS Oral Take 40 mg by mouth AC breakfast.    . HYDROCHLOROTHIAZIDE 25 MG PO TABS Oral Take 1 tablet (25 mg total) by mouth daily. For high blood pressure 30 tablet 0  . HYDROXYZINE HCL 25 MG PO TABS Oral Take 25 mg by mouth 3 (three) times daily as needed. For anxiety.    . ADULT MULTIVITAMIN W/MINERALS CH Oral Take 1 tablet by mouth daily. Vitamin supplement    . NAPROXEN 375 MG PO TABS Oral Take 1 tablet (375 mg total) by mouth 2 (two) times daily. 20 tablet 0  . OMEPRAZOLE 40 MG PO CPDR Oral Take 40 mg by mouth 2 (two) times daily.    Marland Kitchen RISPERIDONE 2 MG PO TABS Oral Take 2 mg by mouth at bedtime.    Marland Kitchen NAPROXEN 375 MG PO TABS Oral Take 1 tablet (375 mg total) by  mouth 2 (two) times daily. 20 tablet 0    BP 147/94  Pulse 82  Temp 98.7 F (37.1 C) (Oral)  Resp 18  SpO2 100%  Physical Exam  Nursing note and vitals reviewed. Constitutional: He appears well-developed and well-nourished. No distress.  HENT:  Head: Normocephalic and atraumatic.  Eyes: Pupils are equal, round, and reactive to light.  Neck: Normal range of motion. Neck supple.  Cardiovascular: Normal rate and regular rhythm.   Pulmonary/Chest: Effort normal.  Abdominal: Soft.  Neurological: He is alert.  Skin: Skin is warm and dry.    ED Course  Procedures (including critical care time)  Labs Reviewed  POCT I-STAT, CHEM 8 - Abnormal; Notable for the following:    Glucose, Bld 100 (*)     Calcium, Ion 1.24 (*)     All other components within normal limits   Dg Chest 2 View  06/10/2012  *RADIOLOGY REPORT*  Clinical Data: Shortness of breath and seizure.  CHEST - 2 VIEW  Comparison: 11/19/2011 and prior chest radiographs  Findings: The cardiomediastinal silhouette is unremarkable. Mild peribronchial thickening is again identified. There is no evidence of focal airspace disease, pulmonary edema, suspicious pulmonary nodule/mass, pleural effusion, or pneumothorax. No acute bony abnormalities are identified.  IMPRESSION: No evidence of active cardiopulmonary disease.  Mild chronic peribronchial thickening.   Original Report Authenticated By: Rosendo Gros, M.D.      1. Medication reaction       MDM  Chest xray and i-stat normal. Pt monitored in ED for 3 hours without any adverse events.  Pharmacist informed me that the patients Tramadol and psych meds lower seizure thresh hold significantly.She recommends that I Rx a different pain medication and dc Ultram. I have discussed this with patient. Referral for Neurology given.  Pt has been advised of the symptoms that warrant their return to the ED. Patient has voiced understanding and has agreed to follow-up with the PCP or  specialist.         Dorthula Matas, PA 06/10/12 [phone]

## 2012-06-10 NOTE — ED Notes (Signed)
States he sees a psychiatrist for his seizures-states increase in number last couple of weeks

## 2012-07-13 ENCOUNTER — Emergency Department (HOSPITAL_COMMUNITY)
Admission: EM | Admit: 2012-07-13 | Discharge: 2012-07-13 | Disposition: A | Discharge status: Home / Self Care | Payer: Self-pay | Attending: Emergency Medicine | Admitting: Emergency Medicine

## 2012-07-13 ENCOUNTER — Encounter (HOSPITAL_COMMUNITY): Payer: Self-pay | Admitting: *Deleted

## 2012-07-13 DIAGNOSIS — J309 Allergic rhinitis, unspecified: Secondary | ICD-10-CM | POA: Insufficient documentation

## 2012-07-13 DIAGNOSIS — Z87891 Personal history of nicotine dependence: Secondary | ICD-10-CM | POA: Insufficient documentation

## 2012-07-13 DIAGNOSIS — F3289 Other specified depressive episodes: Secondary | ICD-10-CM | POA: Insufficient documentation

## 2012-07-13 DIAGNOSIS — I10 Essential (primary) hypertension: Secondary | ICD-10-CM | POA: Insufficient documentation

## 2012-07-13 DIAGNOSIS — R198 Other specified symptoms and signs involving the digestive system and abdomen: Secondary | ICD-10-CM

## 2012-07-13 DIAGNOSIS — M1990 Unspecified osteoarthritis, unspecified site: Secondary | ICD-10-CM | POA: Insufficient documentation

## 2012-07-13 DIAGNOSIS — Z860100 Personal history of colon polyps, unspecified: Secondary | ICD-10-CM | POA: Insufficient documentation

## 2012-07-13 DIAGNOSIS — Z8701 Personal history of pneumonia (recurrent): Secondary | ICD-10-CM | POA: Insufficient documentation

## 2012-07-13 DIAGNOSIS — F1010 Alcohol abuse, uncomplicated: Secondary | ICD-10-CM | POA: Insufficient documentation

## 2012-07-13 DIAGNOSIS — K219 Gastro-esophageal reflux disease without esophagitis: Secondary | ICD-10-CM | POA: Insufficient documentation

## 2012-07-13 DIAGNOSIS — Z7982 Long term (current) use of aspirin: Secondary | ICD-10-CM | POA: Insufficient documentation

## 2012-07-13 DIAGNOSIS — F329 Major depressive disorder, single episode, unspecified: Secondary | ICD-10-CM | POA: Insufficient documentation

## 2012-07-13 DIAGNOSIS — K1370 Unspecified lesions of oral mucosa: Secondary | ICD-10-CM | POA: Insufficient documentation

## 2012-07-13 DIAGNOSIS — Z79899 Other long term (current) drug therapy: Secondary | ICD-10-CM | POA: Insufficient documentation

## 2012-07-13 DIAGNOSIS — F411 Generalized anxiety disorder: Secondary | ICD-10-CM | POA: Insufficient documentation

## 2012-07-13 DIAGNOSIS — Z8719 Personal history of other diseases of the digestive system: Secondary | ICD-10-CM | POA: Insufficient documentation

## 2012-07-13 MED ORDER — LIDOCAINE HCL (PF) 1 % IJ SOLN: 20.0000 mL | Freq: Once | INTRAMUSCULAR | Status: DC

## 2012-07-13 MED ORDER — PENICILLIN V POTASSIUM 500 MG PO TABS: 500.0000 mg | ORAL_TABLET | Freq: Four times a day (QID) | ORAL | Status: DC

## 2012-07-13 MED ORDER — LIDOCAINE-EPINEPHRINE 2 %-1:100000 IJ SOLN: 20.0000 mL | Freq: Once | INTRAMUSCULAR | Status: DC

## 2012-07-13 MED ORDER — LIDOCAINE HCL 1 % IJ SOLN
INTRAMUSCULAR | Status: AC
  Administered 2012-07-13: 20 mL
  Filled 2012-07-13: qty 20

## 2012-07-13 NOTE — ED Notes (Signed)
MD at bedside. 

## 2012-07-13 NOTE — ED Notes (Signed)
Pt reports abscess on right lower gum. Right side of lip is slightly swollen. Pain 8/10. Hx of abscess in teeth.

## 2012-07-13 NOTE — ED Provider Notes (Signed)
History     CSN: [id number]  Arrival date & time 07/13/12  0820   First MD Initiated Contact with Patient 07/13/12 [phone]      Chief Complaint  Patient presents with  . Dental Pain    (Consider location/radiation/quality/duration/timing/severity/associated sxs/prior treatment) HPI Comments: Patient reports that he has had swelling of the oral mucosa of the right cheek for the past 3 days.  He states that it feels similar to when he has had a dental abscess in the past.  He states that he thinks that he bit the inside of the right side of his mouth four days ago while having a seizure.  He reports that he has been accidentally biting that side of his mouth since that time.  Area is tender.  Swelling becoming progressively worse.  No drainage from the area.  He reports that he has been taking Naproxen for the pain, which is helping somewhat.  He denies any pain of the teeth or pain of the jaw.  He does have a dentist.  No fever or chills.    Patient is a 46 y.o. male presenting with tooth pain. The history is provided by the patient.  Dental PainThe primary symptoms include mouth pain. Primary symptoms do not include fever or sore throat.  Additional symptoms do not include: trouble swallowing and drooling.    Past Medical History  Diagnosis Date  . Hypertension   . Depressive disorder, not elsewhere classified   . Osteoarthrosis, unspecified whether generalized or localized, unspecified site   . Irritable bowel syndrome   . Esophageal reflux   . Colon polyps     hyperplastic/  . Allergic rhinitis   . Pancreatitis   . History of pneumonia   . Insomnia   . Anxiety   . Diverticulitis   . Alcohol abuse 2012  . Benzodiazepine abuse     Past Surgical History  Procedure Date  . Tibula surgery     right  . Fibula fracture surgery     right    Family History  Problem Relation Age of Onset  . Heart disease Mother   . Gallbladder disease Mother   . Nephrolithiasis Mother   .  Colon cancer Neg Hx   . Hypertension Father     moth  . Arthritis Other   . Hyperlipidemia Other   . Stroke Other     History  Substance Use Topics  . Smoking status: Former Smoker -- 1.5 packs/day for 18 years    Types: Cigarettes    Quit date: 08/28/1991  . Smokeless tobacco: Current User    Types: Chew  . Alcohol Use: No     Comment: everyday 1/2 to 1 pint  daily- last drink 1 month ago      Review of Systems  Constitutional: Negative for fever and chills.  HENT: Negative for sore throat, drooling, trouble swallowing, neck pain and neck stiffness.   All other systems reviewed and are negative.    Allergies  Codeine; Vicodin; Benazepril; Ativan; Corticosteroids; Lorazepam; and Prednisone  Home Medications   Current Outpatient Rx  Name  Route  Sig  Dispense  Refill  . ALBUTEROL SULFATE HFA 108 (90 BASE) MCG/ACT IN AERS   Inhalation   Inhale 2 puffs into the lungs every 6 (six) hours as needed. For shortness of breath.         . ASPIRIN 81 MG PO CHEW   Oral   Chew 81 mg by mouth daily.         Marland Kitchen  ATENOLOL 50 MG PO TABS   Oral   Take 50 mg by mouth 2 (two) times daily.         Marland Kitchen CARBAMAZEPINE 200 MG PO TABS   Oral   Take 200 mg by mouth 2 (two) times daily.         Marland Kitchen CLONIDINE HCL 0.1 MG PO TABS   Oral   Take 0.1 mg by mouth 2 (two) times daily.          Marland Kitchen FLUOXETINE HCL 20 MG PO TABS   Oral   Take 40 mg by mouth AC breakfast.         . GABAPENTIN 300 MG PO CAPS   Oral   Take 300 mg by mouth 2 (two) times daily.         Marland Kitchen HYDROCHLOROTHIAZIDE 25 MG PO TABS   Oral   Take 1 tablet (25 mg total) by mouth daily. For high blood pressure   30 tablet   0   . HYDROXYZINE HCL 25 MG PO TABS   Oral   Take 25 mg by mouth 3 (three) times daily as needed. For anxiety.         . ADULT MULTIVITAMIN W/MINERALS CH   Oral   Take 1 tablet by mouth daily. Vitamin supplement         . NAPROXEN 375 MG PO TABS   Oral   Take 1 tablet (375 mg total)  by mouth 2 (two) times daily.   20 tablet   0   . OMEPRAZOLE 40 MG PO CPDR   Oral   Take 40 mg by mouth 2 (two) times daily.         Marland Kitchen NAPROXEN 375 MG PO TABS   Oral   Take 1 tablet (375 mg total) by mouth 2 (two) times daily.   20 tablet   0     BP 126/72  Pulse 73  Temp 98.4 F (36.9 C) (Oral)  Resp 16  SpO2 97%  Physical Exam  Nursing note and vitals reviewed. Constitutional: He appears well-developed and well-nourished.  HENT:  Head: Normocephalic and atraumatic. No trismus in the jaw.  Right Ear: Tympanic membrane and ear canal normal.  Left Ear: Tympanic membrane and ear canal normal.  Mouth/Throat: Uvula is midline and oropharynx is clear and moist.       Swelling of the right oral mucosa.  No fluctuance palpated.  Neck: Normal range of motion. Neck supple.  Cardiovascular: Normal rate, regular rhythm and normal heart sounds.   Pulmonary/Chest: Effort normal and breath sounds normal.  Neurological: He is alert.  Skin: Skin is warm and dry. No erythema.  Psychiatric: He has a normal mood and affect.    ED Course  Procedures (including critical care time)  Labs Reviewed - No data to display No results found.   No diagnosis found.  INCISION AND DRAINAGE Performed by: Anne Shutter, Jezreel Sisk Consent: Verbal consent obtained. Risks and benefits: risks, benefits and alternatives were discussed Type: abscess  Body area: right oral mucosa  Anesthesia: local infiltration  Local anesthetic: lidocaine 2% without epinephrine  Anesthetic total: 3 ml  Complexity: complex Blunt dissection to break up loculations  Drainage: none  Drainage amount: none  Patient tolerance: Patient tolerated the procedure well with no immediate complications.     MDM  Patient presenting with swelling of his right oral mucosa.  Needle aspiration performed with no drainage.  Feel that swelling is most likely inflammation from oral  trauma due to patient continuing to bite  the area.          Pascal Lux Tichigan, PA-C 07/13/12 1955

## 2012-07-16 ENCOUNTER — Encounter (HOSPITAL_COMMUNITY): Payer: Self-pay | Admitting: *Deleted

## 2012-07-16 ENCOUNTER — Emergency Department (HOSPITAL_COMMUNITY)
Admission: EM | Admit: 2012-07-16 | Discharge: 2012-07-16 | Disposition: A | Discharge status: Home / Self Care | Payer: Self-pay | Attending: Emergency Medicine | Admitting: Emergency Medicine

## 2012-07-16 DIAGNOSIS — K219 Gastro-esophageal reflux disease without esophagitis: Secondary | ICD-10-CM | POA: Insufficient documentation

## 2012-07-16 DIAGNOSIS — Z8701 Personal history of pneumonia (recurrent): Secondary | ICD-10-CM | POA: Insufficient documentation

## 2012-07-16 DIAGNOSIS — Z79899 Other long term (current) drug therapy: Secondary | ICD-10-CM | POA: Insufficient documentation

## 2012-07-16 DIAGNOSIS — K5732 Diverticulitis of large intestine without perforation or abscess without bleeding: Secondary | ICD-10-CM | POA: Insufficient documentation

## 2012-07-16 DIAGNOSIS — Z8719 Personal history of other diseases of the digestive system: Secondary | ICD-10-CM | POA: Insufficient documentation

## 2012-07-16 DIAGNOSIS — Z87891 Personal history of nicotine dependence: Secondary | ICD-10-CM | POA: Insufficient documentation

## 2012-07-16 DIAGNOSIS — F1010 Alcohol abuse, uncomplicated: Secondary | ICD-10-CM | POA: Insufficient documentation

## 2012-07-16 DIAGNOSIS — Z860100 Personal history of colon polyps, unspecified: Secondary | ICD-10-CM | POA: Insufficient documentation

## 2012-07-16 DIAGNOSIS — F329 Major depressive disorder, single episode, unspecified: Secondary | ICD-10-CM | POA: Insufficient documentation

## 2012-07-16 DIAGNOSIS — G40909 Epilepsy, unspecified, not intractable, without status epilepticus: Secondary | ICD-10-CM | POA: Insufficient documentation

## 2012-07-16 DIAGNOSIS — F411 Generalized anxiety disorder: Secondary | ICD-10-CM | POA: Insufficient documentation

## 2012-07-16 DIAGNOSIS — I10 Essential (primary) hypertension: Secondary | ICD-10-CM | POA: Insufficient documentation

## 2012-07-16 DIAGNOSIS — M1990 Unspecified osteoarthritis, unspecified site: Secondary | ICD-10-CM | POA: Insufficient documentation

## 2012-07-16 DIAGNOSIS — L03211 Cellulitis of face: Secondary | ICD-10-CM | POA: Insufficient documentation

## 2012-07-16 DIAGNOSIS — F3289 Other specified depressive episodes: Secondary | ICD-10-CM | POA: Insufficient documentation

## 2012-07-16 DIAGNOSIS — R1310 Dysphagia, unspecified: Secondary | ICD-10-CM | POA: Insufficient documentation

## 2012-07-16 DIAGNOSIS — L0201 Cutaneous abscess of face: Secondary | ICD-10-CM | POA: Insufficient documentation

## 2012-07-16 DIAGNOSIS — R110 Nausea: Secondary | ICD-10-CM | POA: Insufficient documentation

## 2012-07-16 HISTORY — DX: Unspecified convulsions: R56.9

## 2012-07-16 LAB — BASIC METABOLIC PANEL
Calcium: 10.2 mg/dL (ref 8.4–10.5)
Creatinine, Ser: 0.93 mg/dL (ref 0.50–1.35)
GFR calc Af Amer: >90 mL/min (ref >90)
GFR calc non Af Amer: >90 mL/min (ref >90)
Sodium: 135 mEq/L (ref 135–145)

## 2012-07-16 LAB — CBC WITH DIFFERENTIAL/PLATELET
Basophils Absolute: 0 10*3/uL (ref 0.0–0.1)
Basophils Relative: 0 % (ref 0–1)
Eosinophils Relative: 1 % (ref 0–5)
HCT: 35.6 % — ABNORMAL LOW (ref 39.0–52.0)
MCHC: 33.7 g/dL (ref 30.0–36.0)
MCV: 89.9 fL (ref 78.0–100.0)
Monocytes Absolute: 1.2 10*3/uL — ABNORMAL HIGH (ref 0.1–1.0)
Neutro Abs: 6.4 10*3/uL (ref 1.7–7.7)
Platelets: 274 10*3/uL (ref 150–400)
RDW: 12.3 % (ref 11.5–15.5)
WBC: 9.2 10*3/uL (ref 4.0–10.5)

## 2012-07-16 MED ORDER — CLINDAMYCIN PHOSPHATE 600 MG/50ML IV SOLN
600.0000 mg | Freq: Once | INTRAVENOUS | Status: AC
  Administered 2012-07-16: 600 mg via INTRAVENOUS
  Filled 2012-07-16: qty 50

## 2012-07-16 MED ORDER — CLINDAMYCIN HCL 300 MG PO CAPS: 300.0000 mg | ORAL_CAPSULE | Freq: Four times a day (QID) | ORAL | Status: DC

## 2012-07-16 MED ORDER — KETOROLAC TROMETHAMINE 30 MG/ML IJ SOLN
30.0000 mg | Freq: Once | INTRAMUSCULAR | Status: AC
  Administered 2012-07-16: 30 mg via INTRAVENOUS
  Filled 2012-07-16: qty 1

## 2012-07-16 MED ORDER — MORPHINE SULFATE 4 MG/ML IJ SOLN
4.0000 mg | Freq: Once | INTRAMUSCULAR | Status: AC
  Administered 2012-07-16: 4 mg via INTRAVENOUS
  Filled 2012-07-16: qty 1

## 2012-07-16 MED ORDER — SODIUM CHLORIDE 0.9 % IV BOLUS (SEPSIS)
1000.0000 mL | Freq: Once | INTRAVENOUS | Status: AC
  Administered 2012-07-16: 1000 mL via INTRAVENOUS

## 2012-07-16 NOTE — ED Provider Notes (Signed)
MSE was initiated and I personally evaluated the patient and placed orders (if any) at  4:16 PM on July 16, 2012.  The patient appears stable so that the remainder of the MSE may be completed by another provider.  Patient presents to ED with his wife with worsening right sided facial swelling after being seen in ED on 11/17. States the inside of his cheek was "lanced", and the next morning his face was more swollen. Went to his PCP who drew a marking on his face for the redness. Redness spread past markings. Patient cannot talk, eat or swallow. Obvious swelling noted to right side of face.   Trevor Mace, PA-C 07/16/12 [phone]

## 2012-07-16 NOTE — Progress Notes (Signed)
CM and [employer] liaison spoke with him Pt offered [employer] services to assist w/finding a [place] self pay providers & health reform information Male at bedside reports pt has filed for disability but pt filed but denied an orange card.  Male reports pt does not work and has mental health issues

## 2012-07-16 NOTE — ED Notes (Addendum)
Per Martin Singh and caregiver: Martin Singh developed canker sore last Friday. Martin Singh went to see his PCP for pain and sweling, PCP did an I&D and prescribed oral antibiotics. Martin Singh presents today with swelling to right side of face that has spread past the marked line toward right eye, as well as toward the neck. Martin Singh's bottom lip is swollen,and  bruised, there is purulent drainage on the inside of the bottom lip. Martin Singh's caregiver sts Martin Singh has not been able to eat or drink at all.

## 2012-07-16 NOTE — ED Provider Notes (Signed)
History  This chart was scribed for Loren Racer, MD by Bennett Scrape, ED Scribe. This patient was seen in room WA08/WA08 and the patient's care was started at 5:20 PM.   CSN: [id number]  Arrival date & time 07/16/12  1423   First MD Initiated Contact with Patient 07/16/12 1520      Chief Complaint  Patient presents with  . Facial Swelling     The history is provided by the patient and the spouse. No language interpreter was used.    Martin Singh is a 46 y.o. male who presents to the Emergency Department complaining of 3 days of gradual onset, gradually worsening, constant right-sided facial swelling around the upper lip with associated nausea after having his right cheek lanced in the ED. 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 Wife reports that the pt was seen by PCP on the 18th (2 days ago) for the swelling and given penicillin. She reports that he has been taking the penicillin without improvement. He denies following up with a dentist yet. Pt denies having prior episodes of similar symptoms. He denies discharge from the area. He denies emesis, dental pain, jaw pain and trouble swallowing as associated symptoms.He denies having a h/o DM or being on prednisone on a daily basis. He has a h/o HTN, anxiety and seizures. He is a former smoker but denies recent alcohol use (last drink was one month ago).  Past Medical History  Diagnosis Date  . Hypertension   . Depressive disorder, not elsewhere classified   . Osteoarthrosis, unspecified whether generalized or localized, unspecified site   . Irritable bowel syndrome   . Esophageal reflux   . Colon polyps     hyperplastic/  . Allergic rhinitis   . Pancreatitis   . History of pneumonia   . Insomnia   . Anxiety   . Diverticulitis   . Alcohol abuse 2012  . Benzodiazepine abuse   . Seizures     Past Surgical History  Procedure Date  . Tibula surgery     right  . Fibula fracture  surgery     right    Family History  Problem Relation Age of Onset  . Heart disease Mother   . Gallbladder disease Mother   . Nephrolithiasis Mother   . Colon cancer Neg Hx   . Hypertension Father     moth  . Arthritis Other   . Hyperlipidemia Other   . Stroke Other     History  Substance Use Topics  . Smoking status: Former Smoker -- 1.5 packs/day for 18 years    Types: Cigarettes    Quit date: 08/28/1991  . Smokeless tobacco: Current User    Types: Chew  . Alcohol Use: No     Comment: everyday 1/2 to 1 pint  daily- last drink 1 month ago      Review of Systems  Constitutional: Negative for fever and chills.  HENT: Positive for facial swelling and trouble swallowing. Negative for neck pain and dental problem.   Respiratory: Negative for shortness of breath.   Cardiovascular: Negative for chest pain.  Gastrointestinal: Positive for nausea. Negative for vomiting and abdominal pain.  All other systems reviewed and are negative.    Allergies  Codeine; Vicodin; Benazepril; Ativan; Corticosteroids; Lorazepam; and Prednisone  Home Medications   Current Outpatient Rx  Name  Route  Sig  Dispense  Refill  . ALBUTEROL SULFATE HFA 108 (90 BASE) MCG/ACT  IN AERS   Inhalation   Inhale 2 puffs into the lungs every 6 (six) hours as needed. For shortness of breath.         . ASPIRIN 81 MG PO CHEW   Oral   Chew 81 mg by mouth daily.         . ATENOLOL 50 MG PO TABS   Oral   Take 50 mg by mouth 2 (two) times daily.         Marland Kitchen CARBAMAZEPINE 200 MG PO TABS   Oral   Take 200 mg by mouth 2 (two) times daily.         Marland Kitchen CLONIDINE HCL 0.1 MG PO TABS   Oral   Take 0.1 mg by mouth 2 (two) times daily.          Marland Kitchen FLUOXETINE HCL 20 MG PO TABS   Oral   Take 40 mg by mouth AC breakfast.         . GABAPENTIN 300 MG PO CAPS   Oral   Take 300 mg by mouth 2 (two) times daily.         Marland Kitchen HYDROCHLOROTHIAZIDE 25 MG PO TABS   Oral   Take 1 tablet (25 mg total) by  mouth daily. For high blood pressure   30 tablet   0   . HYDROXYZINE HCL 25 MG PO TABS   Oral   Take 25 mg by mouth 3 (three) times daily as needed. For anxiety.         Marland Kitchen KETOROLAC TROMETHAMINE 10 MG PO TABS   Oral   Take 10 mg by mouth every 6 (six) hours as needed.         . ADULT MULTIVITAMIN W/MINERALS CH   Oral   Take 1 tablet by mouth daily. Vitamin supplement         . NAPROXEN 375 MG PO TABS   Oral   Take 1 tablet (375 mg total) by mouth 2 (two) times daily.   20 tablet   0   . NAPROXEN 375 MG PO TABS   Oral   Take 1 tablet (375 mg total) by mouth 2 (two) times daily.   20 tablet   0   . OMEPRAZOLE 40 MG PO CPDR   Oral   Take 40 mg by mouth 2 (two) times daily.         Marland Kitchen PENICILLIN V POTASSIUM 500 MG PO TABS   Oral   Take 500 mg by mouth 4 (four) times daily.         Marland Kitchen CLINDAMYCIN HCL 300 MG PO CAPS   Oral   Take 1 capsule (300 mg total) by mouth 4 (four) times daily.   28 capsule   0     Triage Vitals: BP 129/84  Pulse 71  Temp 98.9 F (37.2 C) (Oral)  Resp 18  SpO2 96%  Physical Exam  Nursing note and vitals reviewed. Constitutional: He is oriented to person, place, and time. He appears well-developed and well-nourished. No distress.  HENT:  Head: Normocephalic and atraumatic.       Lower lip mucosa with abscess draining purulent material and induration of the lower lip, no dental pain, no gingival abscess or cellulitis, increased pigmentation on the right lower lip, no facial cellulitis   Eyes: Conjunctivae normal and EOM are normal. Pupils are equal, round, and reactive to light.  Neck: Neck supple. No tracheal deviation present.  Cardiovascular: Normal rate, regular rhythm and  normal heart sounds.  Exam reveals no gallop and no friction rub.   No murmur heard. Pulmonary/Chest: Effort normal and breath sounds normal. No respiratory distress.  Abdominal: Soft. He exhibits no mass. There is no tenderness. There is no rebound and no  guarding.  Musculoskeletal: Normal range of motion. He exhibits no edema.  Lymphadenopathy:    He has cervical adenopathy (mild anterior cervical adenopathy).  Neurological: He is alert and oriented to person, place, and time.  Skin: Skin is warm and dry.  Psychiatric: He has a normal mood and affect. His behavior is normal.    ED Course  Procedures (including critical care time)  DIAGNOSTIC STUDIES: Oxygen Saturation is 96% on room air, adequate by my interpretation.    COORDINATION OF CARE: 5:05 PM- Discussed treatment plan which includes IV antibiotics and draining the area with pt at bedside and pt agreed to plan.  5:30 PM- Ordered 600 mg clindamycin, 1,000 mL of bolus and 30 mg Toradol injection  7:32 PM- Consult complete with Dr. Lazarus Salines. Patient case explained and discussed. Dr. Lazarus Salines recommends 600 IV of Clindamycin and prescription for clindamycin. He agrees to follow up with pt in office tomorrow. Call ended at 7:35 PM.  7:45 PM- Ordered 4 mg injection of morphine  Labs Reviewed  CBC WITH DIFFERENTIAL - Abnormal; Notable for the following:    RBC 3.96 (*)     Hemoglobin 12.0 (*)     HCT 35.6 (*)     Monocytes Relative 13 (*)     Monocytes Absolute 1.2 (*)     All other components within normal limits  BASIC METABOLIC PANEL - Abnormal; Notable for the following:    Chloride 94 (*)     BUN 24 (*)     All other components within normal limits   No results found.   1. Facial abscess     INCISION AND DRAINAGE Performed by: Ranae Palms, Santonio Speakman Consent: Verbal consent obtained. Risks and benefits: risks, benefits and alternatives were discussed Type: abscess  Body area: R lower oral mucosa  Anesthesia: local infiltration  Local anesthetic: lidocaine 1% w/ epinephrine  Anesthetic total: 5 ml  Complexity: complex Blunt dissection to break up loculations  Drainage: purulent  Drainage amount: moderate  Packing material: none  Patient tolerance: Patient  tolerated the procedure well with no immediate complications.  Incision made with 11 blade   MDM  I personally performed the services described in this documentation, which was scribed in my presence. The recorded information has been reviewed and is accurate.  Pt encouraged to return immediately for worsening symptoms        Loren Racer, MD 07/16/12 2225

## 2012-07-16 NOTE — ED Notes (Signed)
Pt has swelling to right side of face. Pt was evaluated for same and given oral antibiotics. Reports swelling is worse and pt is unable to eat now.

## 2012-07-16 NOTE — ED Provider Notes (Signed)
Medical screening examination/treatment/procedure(s) were performed by non-physician practitioner and as supervising physician I was immediately available for consultation/collaboration.  Ethelda Chick, MD 07/16/12 1900

## 2012-07-18 NOTE — ED Provider Notes (Signed)
Medical screening examination/treatment/procedure(s) were performed by non-physician practitioner and as supervising physician I was immediately available for consultation/collaboration. Devoria Albe, MD, FACEP   Ward Givens, MD 07/18/12 [phone]

## 2012-09-05 IMAGING — CR DG CHEST 2V
2 series · 2 of 2 positions shown · non-contrast
Comparison: 10/09/2011

CLINICAL DATA: Fall.  Left lower chest pain and shortness of
breath.  Hypertension.

CHEST - 2 VIEW

[w chest pa]
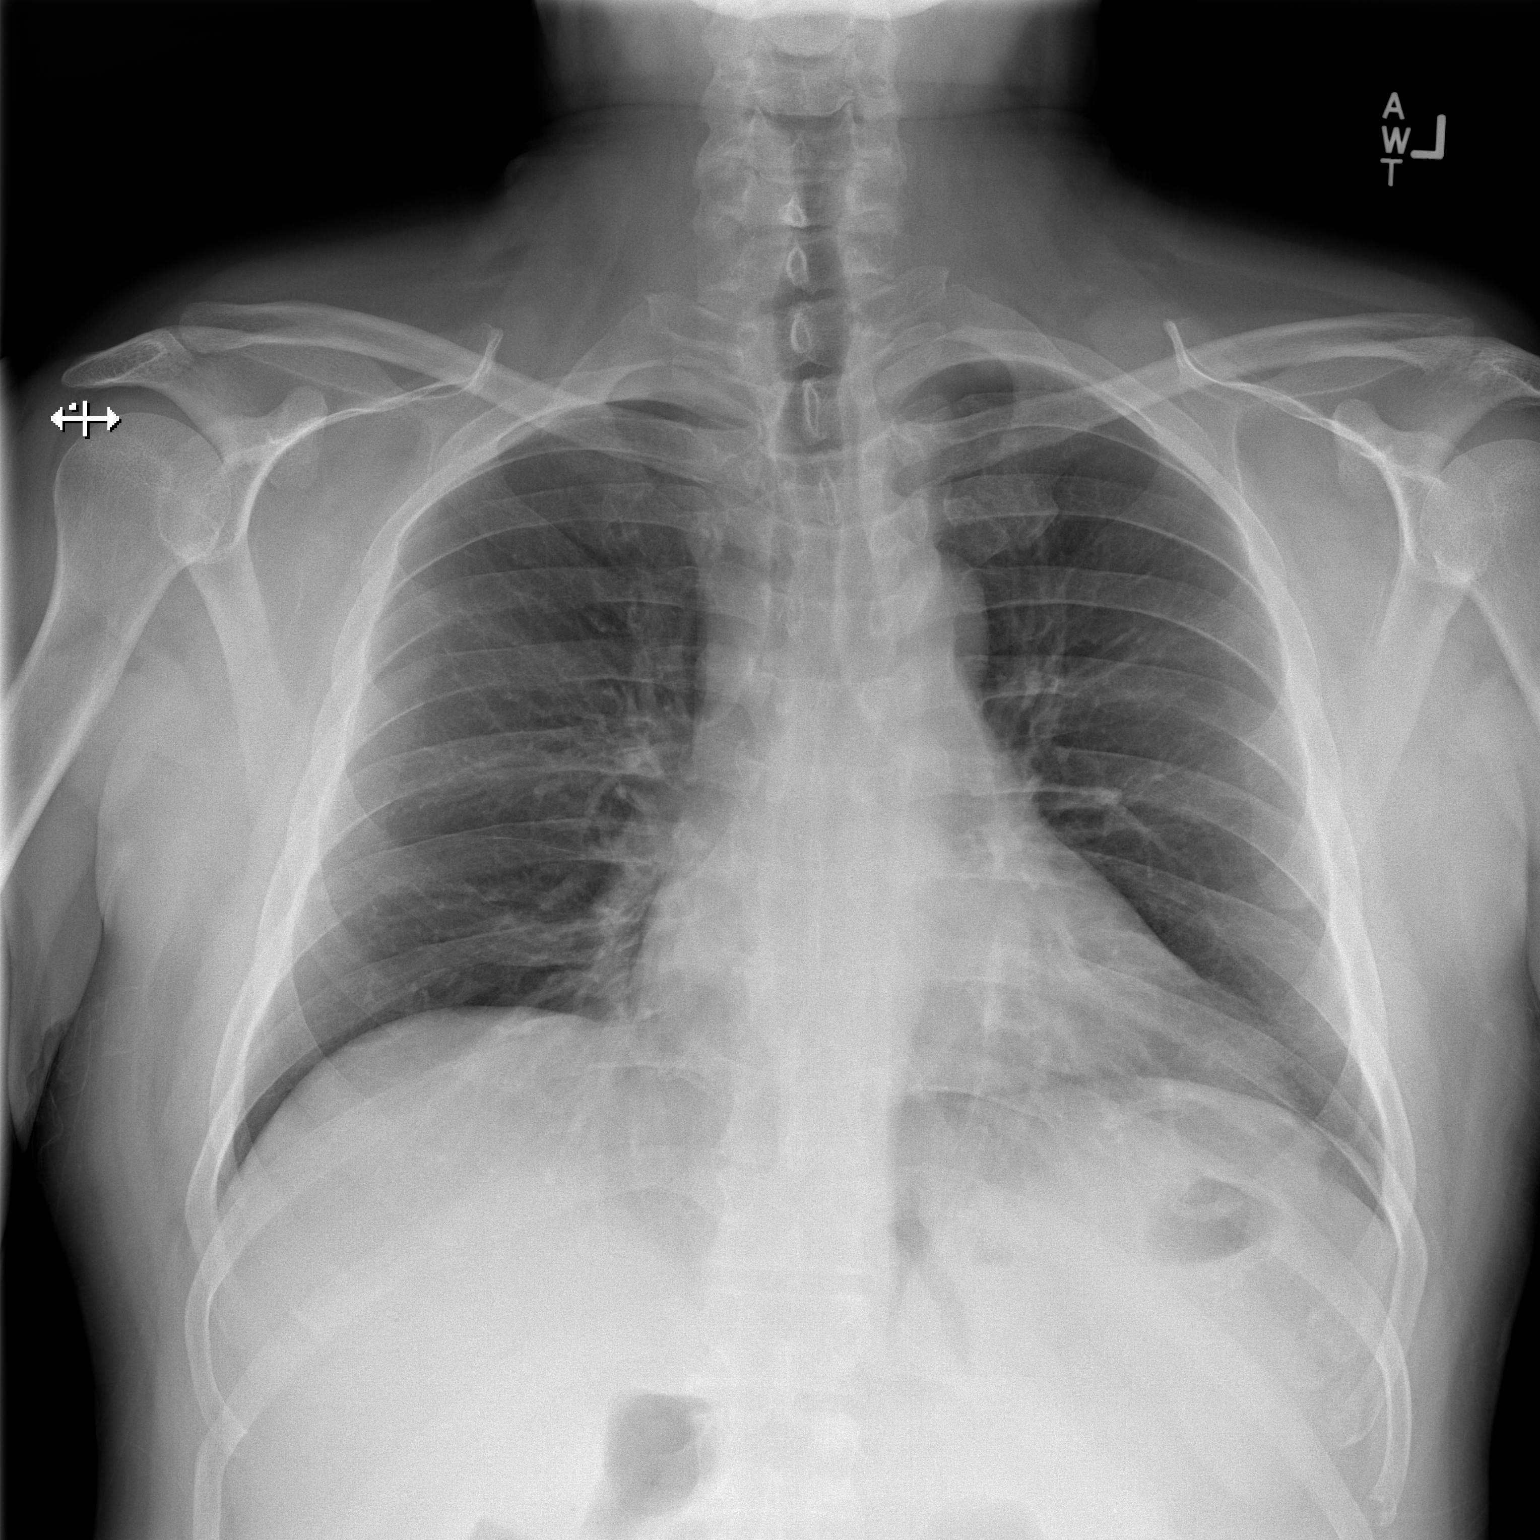

[w chest lat]
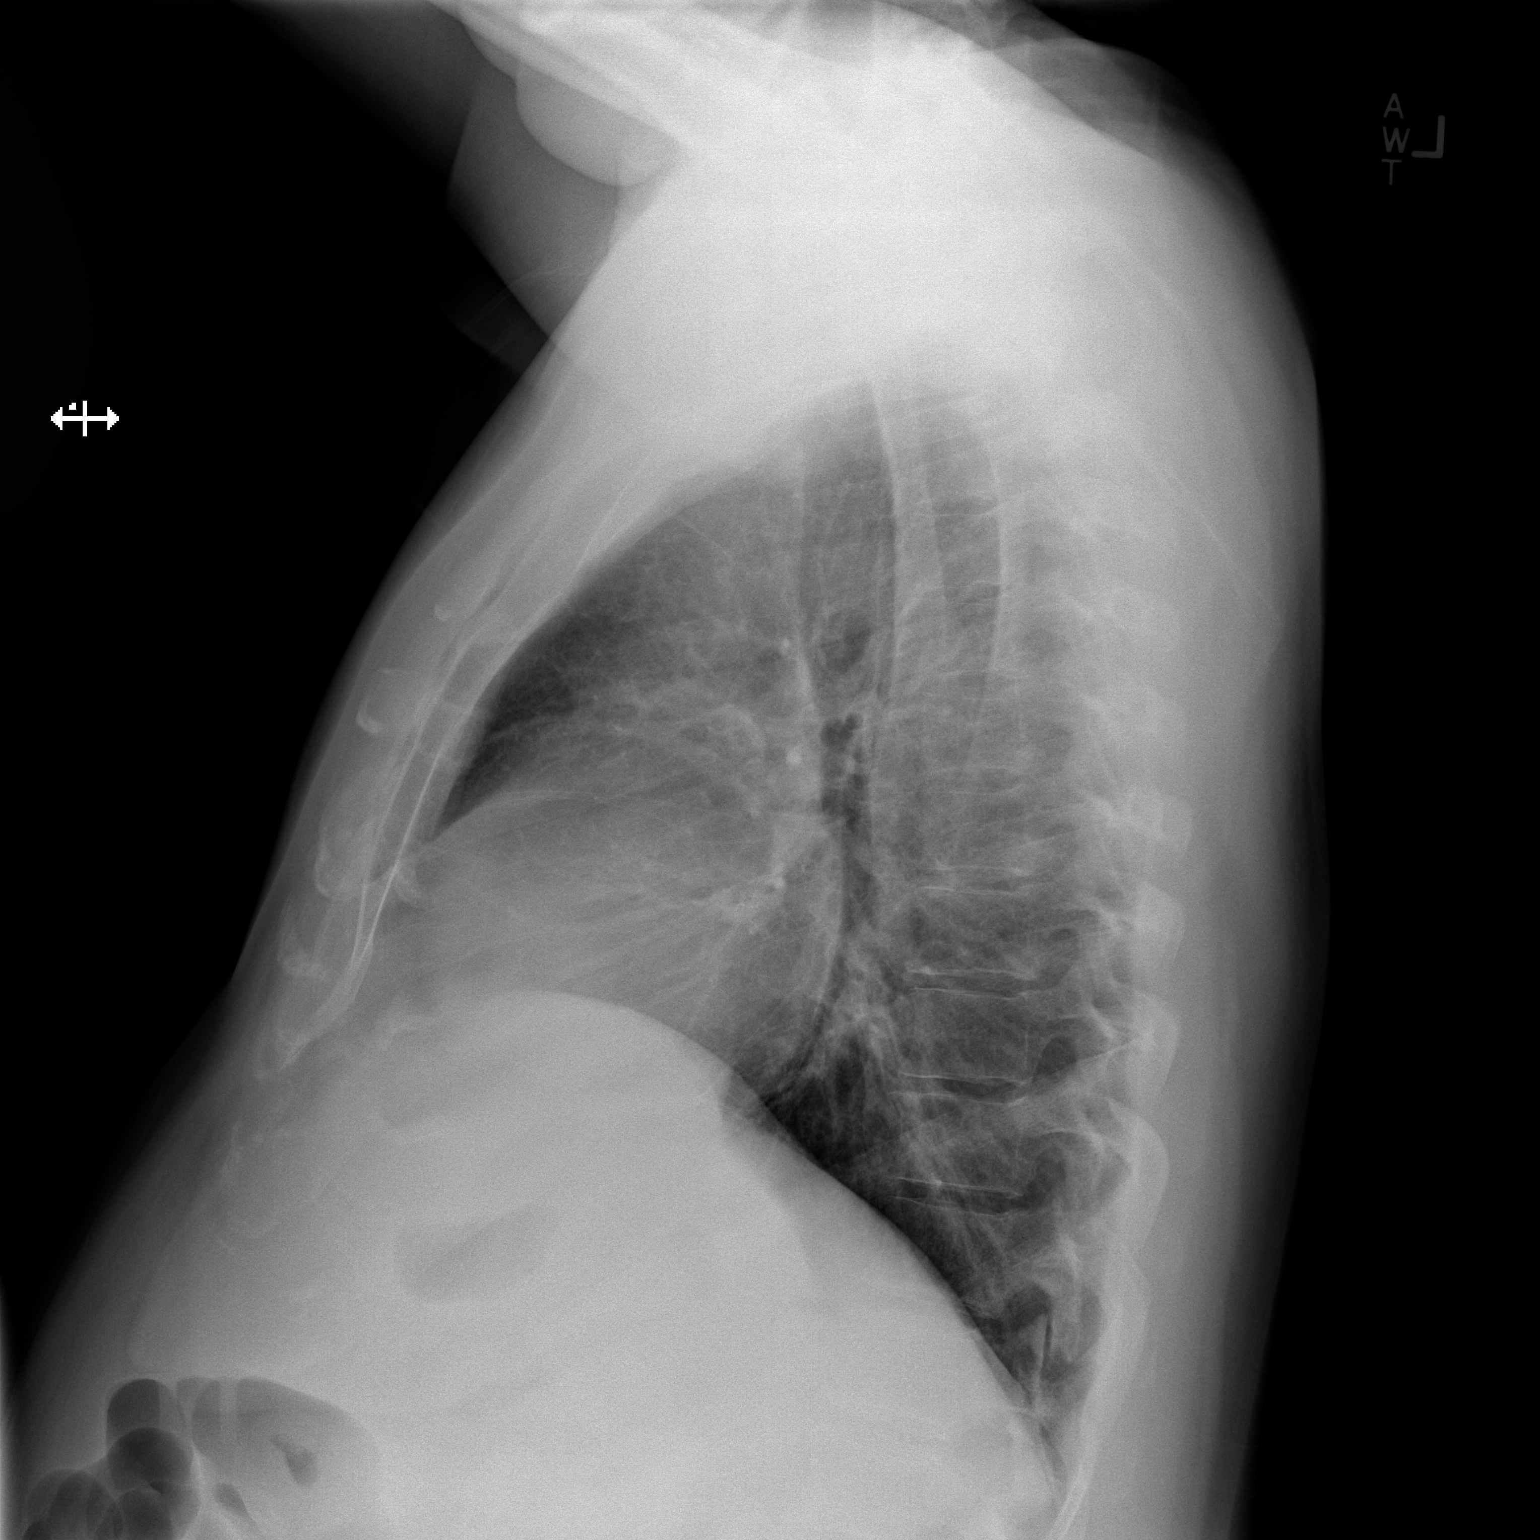

[2 of 2 positions shown; findings below may reference images not displayed]

FINDINGS: Heart size is normal.  The lungs are free of focal
consolidations and pleural effusions.  No pulmonary edema.
Visualized osseous structures have a normal appearance.  No
evidence for pneumothorax.
IMPRESSION: Negative exam.

## 2012-12-13 ENCOUNTER — Encounter (HOSPITAL_COMMUNITY): Payer: Self-pay | Admitting: Emergency Medicine

## 2012-12-13 ENCOUNTER — Emergency Department (HOSPITAL_COMMUNITY): Payer: Self-pay

## 2012-12-13 ENCOUNTER — Emergency Department (HOSPITAL_COMMUNITY)
Admission: EM | Admit: 2012-12-13 | Discharge: 2012-12-13 | Disposition: A | Discharge status: Home / Self Care | Payer: Self-pay | Attending: Emergency Medicine | Admitting: Emergency Medicine

## 2012-12-13 DIAGNOSIS — Z79899 Other long term (current) drug therapy: Secondary | ICD-10-CM | POA: Insufficient documentation

## 2012-12-13 DIAGNOSIS — K429 Umbilical hernia without obstruction or gangrene: Secondary | ICD-10-CM

## 2012-12-13 DIAGNOSIS — Z87891 Personal history of nicotine dependence: Secondary | ICD-10-CM | POA: Insufficient documentation

## 2012-12-13 DIAGNOSIS — Z860100 Personal history of colon polyps, unspecified: Secondary | ICD-10-CM | POA: Insufficient documentation

## 2012-12-13 DIAGNOSIS — F1310 Sedative, hypnotic or anxiolytic abuse, uncomplicated: Secondary | ICD-10-CM | POA: Insufficient documentation

## 2012-12-13 DIAGNOSIS — R141 Gas pain: Secondary | ICD-10-CM | POA: Insufficient documentation

## 2012-12-13 DIAGNOSIS — Z8669 Personal history of other diseases of the nervous system and sense organs: Secondary | ICD-10-CM | POA: Insufficient documentation

## 2012-12-13 DIAGNOSIS — Z8739 Personal history of other diseases of the musculoskeletal system and connective tissue: Secondary | ICD-10-CM | POA: Insufficient documentation

## 2012-12-13 DIAGNOSIS — R142 Eructation: Secondary | ICD-10-CM | POA: Insufficient documentation

## 2012-12-13 DIAGNOSIS — R1031 Right lower quadrant pain: Secondary | ICD-10-CM

## 2012-12-13 DIAGNOSIS — R109 Unspecified abdominal pain: Secondary | ICD-10-CM | POA: Insufficient documentation

## 2012-12-13 DIAGNOSIS — Z8701 Personal history of pneumonia (recurrent): Secondary | ICD-10-CM | POA: Insufficient documentation

## 2012-12-13 DIAGNOSIS — R110 Nausea: Secondary | ICD-10-CM | POA: Insufficient documentation

## 2012-12-13 DIAGNOSIS — Z8719 Personal history of other diseases of the digestive system: Secondary | ICD-10-CM | POA: Insufficient documentation

## 2012-12-13 DIAGNOSIS — K219 Gastro-esophageal reflux disease without esophagitis: Secondary | ICD-10-CM | POA: Insufficient documentation

## 2012-12-13 DIAGNOSIS — R1011 Right upper quadrant pain: Secondary | ICD-10-CM

## 2012-12-13 DIAGNOSIS — Z8659 Personal history of other mental and behavioral disorders: Secondary | ICD-10-CM | POA: Insufficient documentation

## 2012-12-13 DIAGNOSIS — F411 Generalized anxiety disorder: Secondary | ICD-10-CM | POA: Insufficient documentation

## 2012-12-13 DIAGNOSIS — I10 Essential (primary) hypertension: Secondary | ICD-10-CM | POA: Insufficient documentation

## 2012-12-13 HISTORY — DX: Umbilical hernia without obstruction or gangrene: K42.9

## 2012-12-13 LAB — COMPREHENSIVE METABOLIC PANEL
ALT: 22 U/L (ref 0–53)
AST: 27 U/L (ref 0–37)
Albumin: 4.5 g/dL (ref 3.5–5.2)
Alkaline Phosphatase: 41 U/L (ref 39–117)
BUN: 22 mg/dL (ref 6–23)
CO2: 28 mEq/L (ref 19–32)
Calcium: 10 mg/dL (ref 8.4–10.5)
Chloride: 101 mEq/L (ref 96–112)
Creatinine, Ser: 1.15 mg/dL (ref 0.50–1.35)
GFR calc Af Amer: 86 mL/min — ABNORMAL LOW (ref >90)
GFR calc non Af Amer: 74 mL/min — ABNORMAL LOW (ref >90)
Glucose, Bld: 101 mg/dL — ABNORMAL HIGH (ref 70–99)
Potassium: 3.8 mEq/L (ref 3.5–5.1)
Sodium: 139 mEq/L (ref 135–145)
Total Bilirubin: 0.3 mg/dL (ref 0.3–1.2)
Total Protein: 7.6 g/dL (ref 6.0–8.3)

## 2012-12-13 LAB — CBC WITH DIFFERENTIAL/PLATELET
Basophils Absolute: 0 10*3/uL (ref 0.0–0.1)
Basophils Relative: 1 % (ref 0–1)
Eosinophils Absolute: 0.2 10*3/uL (ref 0.0–0.7)
Eosinophils Relative: 4 % (ref 0–5)
HCT: 38.4 % — ABNORMAL LOW (ref 39.0–52.0)
Hemoglobin: 13.4 g/dL (ref 13.0–17.0)
Lymphocytes Relative: 37 % (ref 12–46)
Lymphs Abs: 1.6 10*3/uL (ref 0.7–4.0)
MCH: 31.6 pg (ref 26.0–34.0)
MCHC: 34.9 g/dL (ref 30.0–36.0)
MCV: 90.6 fL (ref 78.0–100.0)
Monocytes Absolute: 0.6 10*3/uL (ref 0.1–1.0)
Monocytes Relative: 14 % — ABNORMAL HIGH (ref 3–12)
Neutro Abs: 1.9 10*3/uL (ref 1.7–7.7)
Neutrophils Relative %: 45 % (ref 43–77)
Platelets: 241 10*3/uL (ref 150–400)
RBC: 4.24 MIL/uL (ref 4.22–5.81)
RDW: 12.5 % (ref 11.5–15.5)
WBC: 4.3 10*3/uL (ref 4.0–10.5)

## 2012-12-13 LAB — URINALYSIS, ROUTINE W REFLEX MICROSCOPIC
Bilirubin Urine: NEGATIVE
Glucose, UA: NEGATIVE mg/dL
Hgb urine dipstick: NEGATIVE
Ketones, ur: NEGATIVE mg/dL
Leukocytes, UA: NEGATIVE
Nitrite: NEGATIVE
Protein, ur: NEGATIVE mg/dL
Specific Gravity, Urine: 1.015 (ref 1.005–1.030)
Urobilinogen, UA: 0.2 mg/dL (ref 0.0–1.0)
pH: 7 (ref 5.0–8.0)

## 2012-12-13 LAB — LIPASE, BLOOD: Lipase: 60 U/L — ABNORMAL HIGH (ref 11–59)

## 2012-12-13 MED ORDER — OXYCODONE-ACETAMINOPHEN 5-325 MG PO TABS: ORAL_TABLET | ORAL | Status: DC

## 2012-12-13 MED ORDER — IOHEXOL 300 MG/ML  SOLN
50.0000 mL | Freq: Once | INTRAMUSCULAR | Status: AC | PRN
  Administered 2012-12-13: 50 mL via ORAL

## 2012-12-13 MED ORDER — IOHEXOL 300 MG/ML  SOLN
100.0000 mL | Freq: Once | INTRAMUSCULAR | Status: AC | PRN
  Administered 2012-12-13: 100 mL via INTRAVENOUS

## 2012-12-13 MED ORDER — HYDROMORPHONE HCL PF 1 MG/ML IJ SOLN
0.5000 mg | Freq: Once | INTRAMUSCULAR | Status: AC
  Administered 2012-12-13: 0.5 mg via INTRAVENOUS
  Filled 2012-12-13: qty 1

## 2012-12-13 MED ORDER — ONDANSETRON HCL 4 MG/2ML IJ SOLN
4.0000 mg | Freq: Once | INTRAMUSCULAR | Status: AC
  Administered 2012-12-13: 4 mg via INTRAVENOUS
  Filled 2012-12-13: qty 2

## 2012-12-13 NOTE — ED Provider Notes (Signed)
History     CSN: [id number]  Arrival date & time 12/13/12  1444   First MD Initiated Contact with Patient 12/13/12 1502      Chief Complaint  Patient presents with  . RUQ pain     (Consider location/radiation/quality/duration/timing/severity/associated sxs/prior treatment) The history is provided by the patient. No language interpreter was used.  Pt is a 47yo male c/o RUQ pain x1 more that is pressure-like in nature.  Mild moderate, made worse with eating, especially in RUQ.  Better with lying down.  States he saw his PCP at [facility] last week who tx him for UTI, completed antibiotics a few days ago.  Denies previous hx of GI problems, no abdominal surgeries.  When questioned further, pt reports pancreatitis 37yrs ago due to alcohol consumption but states he has not had alcohol in over 67mo.  Reports mild nausea, denies fever, vomiting, diarrhea.  Mild dysuria, described as pressure.  Pt reports 10lb weight gain over past month, denies change in diet or major life changes.  States he has started new cholesterol medication but was told by PCP it should not cause weight gain.  Last meal around 10:00 this morning. Has not tried anything for pain.    Past Medical History  Diagnosis Date  . Hypertension   . Depressive disorder, not elsewhere classified   . Osteoarthrosis, unspecified whether generalized or localized, unspecified site   . Irritable bowel syndrome   . Esophageal reflux   . Colon polyps     hyperplastic/  . Allergic rhinitis   . Pancreatitis   . History of pneumonia   . Insomnia   . Anxiety   . Diverticulitis   . Alcohol abuse 2012  . Benzodiazepine abuse   . Seizures     Past Surgical History  Procedure Laterality Date  . Tibula surgery      right  . Fibula fracture surgery      right    Family History  Problem Relation Age of Onset  . Heart disease Mother   . Gallbladder disease Mother   . Nephrolithiasis Mother   . Colon cancer Neg Hx   .  Hypertension Father     moth  . Arthritis Other   . Hyperlipidemia Other   . Stroke Other     History  Substance Use Topics  . Smoking status: Former Smoker -- 1.50 packs/day for 18 years    Types: Cigarettes    Quit date: 08/28/1991  . Smokeless tobacco: Current User    Types: Chew  . Alcohol Use: No     Comment: everyday 1/2 to 1 pint  daily- last drink 1 month ago      Review of Systems  Constitutional: Negative for fever and chills.  Respiratory: Negative for cough, chest tightness and shortness of breath.   Cardiovascular: Negative for chest pain and palpitations.  Gastrointestinal: Positive for nausea ( mild), abdominal pain and abdominal distention. Negative for vomiting, diarrhea and constipation.  Genitourinary: Negative for dysuria and flank pain.  Skin: Negative.     Allergies  Codeine; Vicodin; Benazepril; Ativan; and Corticosteroids  Home Medications   Current Outpatient Rx  Name  Route  Sig  Dispense  Refill  . albuterol (PROVENTIL HFA;VENTOLIN HFA) 108 (90 BASE) MCG/ACT inhaler   Inhalation   Inhale 2 puffs into the lungs every 6 (six) hours as needed. For shortness of breath.         Marland Kitchen aspirin 81 MG chewable tablet  Oral   Chew 81 mg by mouth daily.         Marland Kitchen atenolol (TENORMIN) 50 MG tablet   Oral   Take 50 mg by mouth daily.          . carbamazepine (TEGRETOL) 200 MG tablet   Oral   Take 200 mg by mouth 2 (two) times daily.         . cloNIDine (CATAPRES) 0.1 MG tablet   Oral   Take 0.1 mg by mouth 2 (two) times daily.          Marland Kitchen FLUoxetine (PROZAC) 20 MG capsule   Oral   Take 40 mg by mouth daily.         Marland Kitchen gabapentin (NEURONTIN) 300 MG capsule   Oral   Take 300 mg by mouth 2 (two) times daily.         Marland Kitchen gemfibrozil (LOPID) 600 MG tablet   Oral   Take 600 mg by mouth 2 (two) times daily before a meal.         . hydrochlorothiazide (HYDRODIURIL) 25 MG tablet   Oral   Take 1 tablet (25 mg total) by mouth daily. For  high blood pressure   30 tablet   0   . hydrOXYzine (ATARAX/VISTARIL) 25 MG tablet   Oral   Take 25 mg by mouth 2 (two) times daily as needed for anxiety. For anxiety.         . Multiple Vitamin (MULITIVITAMIN WITH MINERALS) TABS   Oral   Take 1 tablet by mouth daily. Vitamin supplement         . naproxen (NAPROSYN) 500 MG tablet   Oral   Take 500 mg by mouth 2 (two) times daily as needed (pain).         . ranitidine (ZANTAC) 150 MG tablet   Oral   Take 150 mg by mouth 2 (two) times daily.         Marland Kitchen oxyCODONE-acetaminophen (PERCOCET/ROXICET) 5-325 MG per tablet      Take 1-2 tabs every 6-8hrs as needed for pain   6 tablet   0     BP 150/95  Pulse 81  Temp(Src) 99.1 F (37.3 C) (Oral)  Resp 18  SpO2 97%  Physical Exam  Nursing note and vitals reviewed. Constitutional: He appears well-developed and well-nourished. No distress.  Obese male resting comfortably on exam bed  HENT:  Head: Normocephalic and atraumatic.  Eyes: Conjunctivae are normal. No scleral icterus.  Neck: Normal range of motion. Neck supple.  Cardiovascular: Normal rate, regular rhythm and normal heart sounds.   Pulmonary/Chest: Effort normal and breath sounds normal. No respiratory distress. He has no wheezes. He has no rales. He exhibits no tenderness.  Abdominal: Soft. Bowel sounds are normal. He exhibits no distension and no mass. There is tenderness ( mild TTP RUQ and RLQ). There is no rebound and no guarding. Hernia confirmed negative in the right inguinal area and confirmed negative in the left inguinal area.  Genitourinary: Testes normal and penis normal.    Right testis shows no mass, no swelling and no tenderness. Right testis is descended. Cremasteric reflex is not absent on the right side. Left testis shows no mass, no swelling and no tenderness. Left testis is descended. Cremasteric reflex is not absent on the left side. No penile tenderness.  TTP in RLQ new inguinal ligament w/o  herniation. No masses or swelling.  No erythema.   Musculoskeletal: Normal range of motion.  Lymphadenopathy:    He has no cervical adenopathy.       Right: No inguinal adenopathy present.       Left: No inguinal adenopathy present.  Neurological: He is alert.  Skin: Skin is warm and dry. He is not diaphoretic.    ED Course  Procedures (including critical care time)  Labs Reviewed  CBC WITH DIFFERENTIAL - Abnormal; Notable for the following:    HCT 38.4 (*)    Monocytes Relative 14 (*)    All other components within normal limits  LIPASE, BLOOD - Abnormal; Notable for the following:    Lipase 60 (*)    All other components within normal limits  COMPREHENSIVE METABOLIC PANEL - Abnormal; Notable for the following:    Glucose, Bld 101 (*)    GFR calc non Af Amer 74 (*)    GFR calc Af Amer 86 (*)    All other components within normal limits  URINALYSIS, ROUTINE W REFLEX MICROSCOPIC   US Abdomen Complete  12/13/2012  *RADIOLOGY REPORT*  Clinical Data:  47 year old male with right side pain.  COMPLETE ABDOMINAL ULTRASOUND  Comparison:  CT abdomen and pelvis 10/09/2011 and earlier.  Findings:  Gallbladder:  No gallstones, gallbladder wall thickening, or pericholecystic fluid. No sonographic Murphy's sign elicited.  Common bile duct:  Somewhat difficult to visualize but within normal limits measuring up to 4 mm diameter.  Liver:  Interval decreased echogenicity.  Mildly coarse echotexture.  No focal liver lesion.  No intrahepatic ductal dilatation.  IVC:  Incompletely visualized due to overlying bowel gas, visualized portions within normal limits.  Pancreas:  Not visible due to overlying bowel gas.  Spleen:  Normal measuring 6 cm in length.  Right Kidney:  Stable and normal with a small 12 mm mid pole cyst re-identified.  11.1 cm in length.  Left Kidney:  Normal measuring 11.4 cm in length.  Abdominal aorta:  Incompletely visualized due to overlying bowel gas, visualized portions within normal  limits.  IMPRESSION: 1.  Stable and negative gallbladder. 2.  Suspect interval regression of hepatic steatosis. 3.  No acute findings identified in the abdomen.   Original Report Authenticated By: Erskine Speed, M.D.    Ct Abdomen Pelvis W Contrast  12/13/2012  *RADIOLOGY REPORT*  Clinical Data: Right side abdominal pain for 1 month, worse with eating sitting up, past history hypertension, GERD, diverticulitis, irritable bowel syndrome  CT ABDOMEN AND PELVIS WITH CONTRAST  Technique:  Multidetector CT imaging of the abdomen and pelvis was performed following the standard protocol during bolus administration of intravenous contrast. Sagittal and coronal MPR images reconstructed from axial data set.  Contrast: OMNIPAQUE IOHEXOL 300 MG/ML  SOLN Dilute oral contrast.  Comparison: 10/09/2011  Findings: Lung bases clear. Liver, spleen, pancreas, kidneys, and adrenal glands normal appearance. Small diverticulum arising from posterior margin of gastric fundus. Normal appendix. Few scattered sigmoid diverticula without evidence of acute diverticulitis. Stomach and bowel loops otherwise normal appearance.  No mass, adenopathy, free fluid or inflammatory process. Question tiny periumbilical hernia containing fat. Bladder and ureters normal appearance. No acute osseous findings.  IMPRESSION: Sigmoid diverticulosis without evidence of diverticulitis. Small gastric diverticulum and suspected tiny periumbilical hernia. No acute intra-abdominal or pelvic abnormalities.   Original Report Authenticated By: Ulyses Southward, M.D.    Dg Abd Acute W/chest  12/13/2012  *RADIOLOGY REPORT*  Clinical Data: 47 year old male with abdominal pain.  Intense right abdominal pain.  History pancreatitis.  ACUTE ABDOMEN SERIES (ABDOMEN 2 VIEW & CHEST  1 VIEW)  Comparison: Chest radiograph 06/10/2012.  Chest CTA and CT abdomen and pelvis 10/09/2011.  Findings: Stable lung volumes.  No pneumothorax or pneumoperitoneum.  The lungs remain clear.   Cardiac size and mediastinal contours are within normal limits.  Visualized tracheal air column is within normal limits.  Nonobstructed bowel gas pattern.  Stable abdominal and pelvic visceral contours. No osseous abnormality identified.  IMPRESSION:  1. Nonobstructed bowel gas pattern, no free air. 2. No acute cardiopulmonary abnormality.   Original Report Authenticated By: Erskine Speed, M.D.      1. RUQ abdominal pain   2. RLQ abdominal pain       MDM  Pt c/o RUQ pain x45mo.  Saw PCP for same who believed sxs were due to UTI.  Pt completed antibiotics a few days ago.  C/o some nausea but no vomiting or diarrhea.  Mild dysuria.  Also c/o bloating x95mo. Denies chest pain or dyspnea but does state RUQ pain worse with deep inspiration.  Denies previous abdominal surgeries.   PE: heart & lungs nl, CTAB, no wheezes rubs or rhonchi.   Ab: soft, mild distention (pt is obese but states distended more than nl), TTP RUL and RLQ.  No rebound guarding or masses.  TTP near inguinal ligament w/o mass Testicular: no ttp, masses or swelling. No hernia.   Labs: CBC, CMP, Lipase, UA Imaging: acute abd w/ chest, US abdomen.  Consulted Dr. Ethelda Chick.  Will start with US abdomen, may need CT if nothing seen on [place]. Pt mentioned to Dr. 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    Gave zofran and dilaudid for pain.      7:36 PM Pt states pain has improved from 6/10 to 1-2/10.  Advised pt [place] was nl and labs noncontributory.  Will obtain CT.    Labs: noncontributory.  Imaging: noncontributory, possible tiny periumbilical hernia (not where pt is experiencing pain)   7:36 PM Discussed with pt labs and imaging were unremarkable.  Not able to pinpoint cause for patient's pain.  Pt mentioned he does have a GI specialist, Dr. Starr Sinclair.  Advised pt to f/u with him for further evaluation and pain management.    Low concern for emergent  process taking place at this time.  No further work up or intervention required at this time.   Rx: percocet.  Pt stated he breaks out in hives and rash with vicodin but has taken percocet in past w/o problems.  Provided pt with 6 tabs until he can f/u with GI.    Vitals: unremarkable. Discharged in stable condition.    Discussed pt with attending during ED encounter.       Junius Finner, PA-C 12/13/12 1936

## 2012-12-13 NOTE — ED Notes (Signed)
Patient transported to Ultrasound 

## 2012-12-13 NOTE — ED Provider Notes (Signed)
Complains of right-sided abdominal pain for one month, worse with eating. No fever no vomiting. Last bowel movement this morning, normal last 8:10 AM today. Pain is worse with sitting up. Improved Remaining still. On exam alert nontoxic HEENT exam extremities moist. No upper teeth neck supple trachea midline lungs clear auscultation abdomen obese tender right upper quadrant and right lower quadrants.(RLQ>RUQ) no mass no bruit no guarding no rigidity genitalia normal male  Doug Sou, MD 12/13/12 1605

## 2012-12-13 NOTE — ED Notes (Signed)
Per patient, saw PCP yesterday for same symptoms-RUQ pain for 1 month-No N/V/D

## 2012-12-13 NOTE — ED Provider Notes (Signed)
Medical screening examination/treatment/procedure(s) were conducted as a shared visit with non-physician practitioner(s) and myself.  I personally evaluated the patient during the encounter  Doug Sou, MD 12/13/12 2108

## 2012-12-20 ENCOUNTER — Emergency Department (HOSPITAL_COMMUNITY): Payer: Self-pay

## 2012-12-20 ENCOUNTER — Emergency Department (HOSPITAL_COMMUNITY)
Admission: EM | Admit: 2012-12-20 | Discharge: 2012-12-20 | Disposition: A | Discharge status: Home / Self Care | Payer: Self-pay | Attending: Emergency Medicine | Admitting: Emergency Medicine

## 2012-12-20 ENCOUNTER — Encounter (HOSPITAL_COMMUNITY): Payer: Self-pay

## 2012-12-20 ENCOUNTER — Other Ambulatory Visit: Payer: Self-pay

## 2012-12-20 DIAGNOSIS — J40 Bronchitis, not specified as acute or chronic: Secondary | ICD-10-CM

## 2012-12-20 DIAGNOSIS — F329 Major depressive disorder, single episode, unspecified: Secondary | ICD-10-CM | POA: Insufficient documentation

## 2012-12-20 DIAGNOSIS — J45909 Unspecified asthma, uncomplicated: Secondary | ICD-10-CM | POA: Insufficient documentation

## 2012-12-20 DIAGNOSIS — Z79899 Other long term (current) drug therapy: Secondary | ICD-10-CM | POA: Insufficient documentation

## 2012-12-20 DIAGNOSIS — I10 Essential (primary) hypertension: Secondary | ICD-10-CM | POA: Insufficient documentation

## 2012-12-20 DIAGNOSIS — Z87891 Personal history of nicotine dependence: Secondary | ICD-10-CM | POA: Insufficient documentation

## 2012-12-20 DIAGNOSIS — R509 Fever, unspecified: Secondary | ICD-10-CM | POA: Insufficient documentation

## 2012-12-20 DIAGNOSIS — Z8719 Personal history of other diseases of the digestive system: Secondary | ICD-10-CM | POA: Insufficient documentation

## 2012-12-20 DIAGNOSIS — F1010 Alcohol abuse, uncomplicated: Secondary | ICD-10-CM | POA: Insufficient documentation

## 2012-12-20 DIAGNOSIS — R0789 Other chest pain: Secondary | ICD-10-CM | POA: Insufficient documentation

## 2012-12-20 DIAGNOSIS — F3289 Other specified depressive episodes: Secondary | ICD-10-CM | POA: Insufficient documentation

## 2012-12-20 DIAGNOSIS — Z7982 Long term (current) use of aspirin: Secondary | ICD-10-CM | POA: Insufficient documentation

## 2012-12-20 DIAGNOSIS — Z791 Long term (current) use of non-steroidal anti-inflammatories (NSAID): Secondary | ICD-10-CM | POA: Insufficient documentation

## 2012-12-20 DIAGNOSIS — J209 Acute bronchitis, unspecified: Secondary | ICD-10-CM | POA: Insufficient documentation

## 2012-12-20 DIAGNOSIS — Z8701 Personal history of pneumonia (recurrent): Secondary | ICD-10-CM | POA: Insufficient documentation

## 2012-12-20 MED ORDER — AMOXICILLIN-POT CLAVULANATE 875-125 MG PO TABS: 1.0000 | ORAL_TABLET | Freq: Two times a day (BID) | ORAL | Status: DC

## 2012-12-20 MED ORDER — ALBUTEROL SULFATE (5 MG/ML) 0.5% IN NEBU
5.0000 mg | INHALATION_SOLUTION | Freq: Once | RESPIRATORY_TRACT | Status: AC
  Administered 2012-12-20: 5 mg via RESPIRATORY_TRACT
  Filled 2012-12-20: qty 1

## 2012-12-20 NOTE — ED Notes (Signed)
Pt discharged.Vital signs stable and GCS 15 

## 2012-12-20 NOTE — ED Provider Notes (Signed)
History     CSN: [id number]  Arrival date & time 12/20/12  1607   First MD Initiated Contact with Patient 12/20/12 1657      Chief Complaint  Patient presents with  . Chest Pain    (Consider location/radiation/quality/duration/timing/severity/associated sxs/prior treatment) HPI Pt is a 47yo male hx of asthma c/o productive cough x3-4days with yellow sputum, associated with CP that is diffuse and sore/achy.  CP is worse with inspiration and coughing.  Reports fever of 101F earlier today.  Took 324mg  ASA earlier per 911 operator.  Tried albuterol inhaler and robitussin w/o relief.  Denies fever, n/v/d. Denies recent travel or sick contacts.  Pt is a former smoker.   Past Medical History  Diagnosis Date  . Hypertension   . Depressive disorder, not elsewhere classified   . Osteoarthrosis, unspecified whether generalized or localized, unspecified site   . Irritable bowel syndrome   . Esophageal reflux   . Colon polyps     hyperplastic/  . Allergic rhinitis   . Pancreatitis   . History of pneumonia   . Insomnia   . Anxiety   . Diverticulitis   . Alcohol abuse 2012  . Benzodiazepine abuse   . Seizures     Past Surgical History  Procedure Laterality Date  . Tibula surgery      right  . Fibula fracture surgery      right    Family History  Problem Relation Age of Onset  . Heart disease Mother   . Gallbladder disease Mother   . Nephrolithiasis Mother   . Colon cancer Neg Hx   . Hypertension Father     moth  . Arthritis Other   . Hyperlipidemia Other   . Stroke Other     History  Substance Use Topics  . Smoking status: Former Smoker -- 1.50 packs/day for 18 years    Types: Cigarettes    Quit date: 08/28/1991  . Smokeless tobacco: Current User    Types: Chew  . Alcohol Use: No     Comment: everyday 1/2 to 1 pint  daily- last drink 1 month ago      Review of Systems  Constitutional: Positive for fever and fatigue. Negative for chills and appetite change.   Respiratory: Positive for cough, chest tightness, shortness of breath and wheezing. Negative for stridor.   Cardiovascular: Positive for chest pain. Negative for palpitations and leg swelling.  Gastrointestinal: Positive for abdominal pain ( mild). Negative for nausea, vomiting and diarrhea.  All other systems reviewed and are negative.    Allergies  Codeine; Vicodin; Benazepril; Ativan; and Corticosteroids  Home Medications   Current Outpatient Rx  Name  Route  Sig  Dispense  Refill  . albuterol (PROVENTIL HFA;VENTOLIN HFA) 108 (90 BASE) MCG/ACT inhaler   Inhalation   Inhale 2 puffs into the lungs every 6 (six) hours as needed. For shortness of breath.         Marland Kitchen aspirin 81 MG chewable tablet   Oral   Chew 81 mg by mouth daily.         Marland Kitchen atenolol (TENORMIN) 50 MG tablet   Oral   Take 50 mg by mouth daily.          . carbamazepine (TEGRETOL) 200 MG tablet   Oral   Take 200 mg by mouth 2 (two) times daily.         . cloNIDine (CATAPRES) 0.1 MG tablet   Oral   Take 0.1 mg by mouth  2 (two) times daily.          Marland Kitchen dextromethorphan-guaiFENesin (ROBITUSSIN-DM) 10-100 MG/5ML liquid   Oral   Take 10 mLs by mouth every 4 (four) hours as needed for cough.         Marland Kitchen FLUoxetine (PROZAC) 20 MG capsule   Oral   Take 40 mg by mouth daily.         Marland Kitchen gabapentin (NEURONTIN) 300 MG capsule   Oral   Take 300 mg by mouth 2 (two) times daily.         Marland Kitchen gemfibrozil (LOPID) 600 MG tablet   Oral   Take 600 mg by mouth 2 (two) times daily before a meal.         . hydrochlorothiazide (HYDRODIURIL) 25 MG tablet   Oral   Take 1 tablet (25 mg total) by mouth daily. For high blood pressure   30 tablet   0   . hydrOXYzine (ATARAX/VISTARIL) 25 MG tablet   Oral   Take 25 mg by mouth 2 (two) times daily as needed for anxiety. For anxiety.         Marland Kitchen loratadine (CLARITIN) 10 MG tablet   Oral   Take 10 mg by mouth daily.         . Multiple Vitamin (MULITIVITAMIN WITH  MINERALS) TABS   Oral   Take 1 tablet by mouth daily. Vitamin supplement         . naproxen (NAPROSYN) 500 MG tablet   Oral   Take 500 mg by mouth 2 (two) times daily as needed (pain).         Marland Kitchen oxyCODONE-acetaminophen (PERCOCET/ROXICET) 5-325 MG per tablet      Take 1-2 tabs every 6-8hrs as needed for pain   6 tablet   0   . ranitidine (ZANTAC) 150 MG tablet   Oral   Take 150 mg by mouth 2 (two) times daily.         Marland Kitchen amoxicillin-clavulanate (AUGMENTIN) 875-125 MG per tablet   Oral   Take 1 tablet by mouth every 12 (twelve) hours.   14 tablet   0     BP 129/96  Pulse 72  Temp(Src) 98.4 F (36.9 C) (Oral)  Resp 18  SpO2 100%  Physical Exam  Nursing note and vitals reviewed. Constitutional: He appears well-developed and well-nourished. No distress.  HENT:  Head: Normocephalic and atraumatic.  Right Ear: External ear normal.  Left Ear: External ear normal.  Mouth/Throat: Oropharynx is clear and moist. No oropharyngeal exudate.  Eyes: Conjunctivae are normal. No scleral icterus.  Neck: Normal range of motion. Neck supple. No JVD present.  Cardiovascular: Normal rate, regular rhythm and normal heart sounds.   Pulmonary/Chest: Effort normal. No respiratory distress. He has wheezes ( diffuse ). He has rales ( diffuse). He exhibits tenderness (entire chest wall).  Abdominal: Soft. Bowel sounds are normal. He exhibits no distension and no mass. There is tenderness ( mild, epigastrium). There is no rebound and no guarding.  Musculoskeletal: Normal range of motion.  Lymphadenopathy:    He has no cervical adenopathy.  Neurological: He is alert.  Skin: Skin is warm and dry. He is not diaphoretic.    ED Course  Procedures (including critical care time)  Labs Reviewed - No data to display Dg Chest 2 View  12/20/2012  *RADIOLOGY REPORT*  Clinical Data: Shortness of breath, cough and chest pain.  CHEST - 2 VIEW  Comparison: 12/13/2012  Findings: The cardiomediastinal  silhouette  is unremarkable.  There is no evidence of focal airspace disease, pulmonary edema, suspicious pulmonary nodule/mass, pleural effusion, or pneumothorax. No acute bony abnormalities are identified.  IMPRESSION: No evidence of active cardiopulmonary disease.   Original Report Authenticated By: Harmon Pier, M.D.     Date: 12/20/2012  Rate: 73  Rhythm: normal sinus rhythm  QRS Axis: normal  Intervals: normal  ST/T Wave abnormalities: normal  Conduction Disutrbances:none  Narrative Interpretation:   Old EKG Reviewed: none available     1. Bronchitis       MDM  Pt c/o productive cough and chest pain worsened with cough and inspiration x3 days.  Hx of asthma and smoking. Reports fever of 101 earlier today. Has tried albuterol and Robutussin w/o relief.  ASA with minimal relief.  Denies n/v/d, recent travel or sick contacts.  Denies calf pain.  Vitals: nl, 100% O2. PE: diffuse wheezes and rhonchi, diffuse chest wall tenderness. Concern for pneumonia, asthma exacerbation, bronchitis.  PERC: neg, Low likelihood of PE. No cardiac hx. H&P not consistent with ACS. Will obtain CXR. CXR: neg.  Lungs CTAB post neb tx in ED.  No need for additional workup.  Will dx pt home. Rx: augmentin. Pt states he has albuterol at home.  Advised not to smoke and stay away from smokers.  F/u with PCP Dr. Minerva Areola.   Discussed plan with Dr. Effie Shy who agreed. Vitals: unremarkable. Discharged in stable condition.    Discussed pt with attending during ED encounter.           Junius Finner, PA-C 12/20/12 1920  Junius Finner, PA-C 12/20/12 2326

## 2012-12-20 NOTE — ED Notes (Signed)
Pt presents today with c/o CP.  Pt has pain upon inspiration and coughing.  Pt states productive cough x 3 days with yellow sputum.  Pt took 324 mg ASA per instructions from 911 operator.

## 2012-12-21 NOTE — ED Provider Notes (Signed)
Medical screening examination/treatment/procedure(s) were performed by non-physician practitioner and as supervising physician I was immediately available for consultation/collaboration.  Flint Melter, MD 12/21/12 [phone]

## 2012-12-31 ENCOUNTER — Ambulatory Visit (INDEPENDENT_AMBULATORY_CARE_PROVIDER_SITE_OTHER): Payer: Self-pay | Admitting: Nurse Practitioner

## 2012-12-31 ENCOUNTER — Telehealth: Payer: Self-pay | Admitting: Internal Medicine

## 2012-12-31 ENCOUNTER — Encounter: Payer: Self-pay | Admitting: *Deleted

## 2012-12-31 VITALS — BP 118/74 | HR 80 | Ht 60.5 in | Wt 174.0 lb

## 2012-12-31 DIAGNOSIS — R140 Abdominal distension (gaseous): Secondary | ICD-10-CM

## 2012-12-31 DIAGNOSIS — R112 Nausea with vomiting, unspecified: Secondary | ICD-10-CM | POA: Insufficient documentation

## 2012-12-31 DIAGNOSIS — R141 Gas pain: Secondary | ICD-10-CM

## 2012-12-31 NOTE — Progress Notes (Signed)
History of Present Illness:  Patient is a 47 year old male known to Dr. Marina Goodell. He was evaluated in 2005, 2009, and July 2012 for multiple gastrointestinal complaints including GERD,  Nausea, vomiting and abdominal distention. He has a history of alcohol abuse, alcoholic pancreatitis, and irritable bowel syndrome. He hasn't consumed ETOH in 10 months. EGD in 2005 compatible with ulcerative esophagitis. A colonoscopy with polypectomy, also done in 2005, was normal except for polyps (path = hamartomas).  A repeat EGD in 2009 revealed erosive gastritis. Following his July 2012 visit patient underwent repeat colonoscopy with intubation of the terminal ileum for evaluation of rectal bleeding. The exam was normal except for diverticulosis and hemorrhoids.   Patient is back for evaluation of abdominal distention, vomiting, lower back pain, RUQ pain, nausea and vomiting. Patient evaluated at ED 12/13/12 for RUQ pain. Abdominal series, Ultrasound, contrast CTscan and labs were all unremarkable. He was given rx for percocet and discharged home. He and caregiver are here to find out why he has had these symptoms for years. His bowel movements are normal. Patient has been taking Zantac BID for 6 months. Recently made some dietary changes with reduction in spicy foods, sauces. He has been trying to lose some weight.  He takes Naprosyn BID.   Current Medications, Allergies, Past Medical History, Past Surgical History, Family History and Social History were reviewed in [employer] record.     US Abdomen Complete  12/13/2012  *RADIOLOGY REPORT*  Clinical Data:  47 year old male with right side pain.  COMPLETE ABDOMINAL ULTRASOUND  Comparison:  CT abdomen and pelvis 10/09/2011 and earlier.  Findings:  Gallbladder:  No gallstones, gallbladder wall thickening, or pericholecystic fluid. No sonographic Murphy's sign elicited.  Common bile duct:  Somewhat difficult to visualize but within normal limits  measuring up to 4 mm diameter.  Liver:  Interval decreased echogenicity.  Mildly coarse echotexture.  No focal liver lesion.  No intrahepatic ductal dilatation.  IVC:  Incompletely visualized due to overlying bowel gas, visualized portions within normal limits.  Pancreas:  Not visible due to overlying bowel gas.  Spleen:  Normal measuring 6 cm in length.  Right Kidney:  Stable and normal with a small 12 mm mid pole cyst re-identified.  11.1 cm in length.  Left Kidney:  Normal measuring 11.4 cm in length.  Abdominal aorta:  Incompletely visualized due to overlying bowel gas, visualized portions within normal limits.  IMPRESSION: 1.  Stable and negative gallbladder. 2.  Suspect interval regression of hepatic steatosis. 3.  No acute findings identified in the abdomen.   Original Report Authenticated By: Erskine Speed, M.D.    Ct Abdomen Pelvis W Contrast  12/13/2012  *RADIOLOGY REPORT*  Clinical Data: Right side abdominal pain for 1 month, worse with eating sitting up, past history hypertension, GERD, diverticulitis, irritable bowel syndrome  CT ABDOMEN AND PELVIS WITH CONTRAST  Technique:  Multidetector CT imaging of the abdomen and pelvis was performed following the standard protocol during bolus administration of intravenous contrast. Sagittal and coronal MPR images reconstructed from axial data set.  Contrast: OMNIPAQUE IOHEXOL 300 MG/ML  SOLN Dilute oral contrast.  Comparison: 10/09/2011  Findings: Lung bases clear. Liver, spleen, pancreas, kidneys, and adrenal glands normal appearance. Small diverticulum arising from posterior margin of gastric fundus. Normal appendix. Few scattered sigmoid diverticula without evidence of acute diverticulitis. Stomach and bowel loops otherwise normal appearance.  No mass, adenopathy, free fluid or inflammatory process. Question tiny periumbilical hernia containing fat. Bladder and ureters  normal appearance. No acute osseous findings.  IMPRESSION: Sigmoid diverticulosis  without evidence of diverticulitis. Small gastric diverticulum and suspected tiny periumbilical hernia. No acute intra-abdominal or pelvic abnormalities.   Original Report Authenticated By: Ulyses Southward, M.D.    Dg Abd Acute W/chest  12/13/2012  *RADIOLOGY REPORT*  Clinical Data: 47 year old male with abdominal pain.  Intense right abdominal pain.  History pancreatitis.  ACUTE ABDOMEN SERIES (ABDOMEN 2 VIEW & CHEST 1 VIEW)  Comparison: Chest radiograph 06/10/2012.  Chest CTA and CT abdomen and pelvis 10/09/2011.  Findings: Stable lung volumes.  No pneumothorax or pneumoperitoneum.  The lungs remain clear.  Cardiac size and mediastinal contours are within normal limits.  Visualized tracheal air column is within normal limits.  Nonobstructed bowel gas pattern.  Stable abdominal and pelvic visceral contours. No osseous abnormality identified.  IMPRESSION:  1. Nonobstructed bowel gas pattern, no free air. 2. No acute cardiopulmonary abnormality.   Original Report Authenticated By: Erskine Speed, M.D.    Physical Exam: General: Well developed , white male is male in no acute distress Head: Normocephalic and atraumatic Eyes:  sclerae anicteric, conjunctiva pink  Ears: Normal auditory acuity Lungs: Clear throughout to auscultation Heart: Regular rate and rhythm Abdomen: Soft, proturberant, non-tender. No masses, no hepatomegaly. Normal bowel sounds Musculoskeletal: Symmetrical with no gross deformities  Extremities: No edema  Neurological: Alert oriented x 4, grossly nonfocal Psychological:  Alert and cooperative. Normal mood and affect  Assessment and Recommendations: Chronic intermittent nausea, vomiting, RUQ pain, and abdominal distention.  Extensive workup over the years has been unrevealing. Symptoms may be functional but suppose he could have delayed gastric emptying contributing to the chronic nausea, vomiting and bloating. Will check a gastric emptying study. Patient will follow up with Dr.Perry in a  month or so but knows to call in the interim if symptoms worsen.  2. RUQ pain, chronic. Etiology not clear, possibly functional or musculoskeletal. LFTs normal. U/S and CT scan negative.

## 2012-12-31 NOTE — Telephone Encounter (Signed)
Pt seen in the hospital ER recently for abdominal pain. States pt was placed on antibiotics but he is not getting better. Reports his abdomen is very distended. Pt also c/o nausea and vomiting.  Requesting pt be seen. Pt scheduled to see Willette Cluster NP today at 2pm. States pt has no insurance or money. States pt has applied for [facility] and is waiting to hear from them. Pt will be seen today as a one time charity visit, let them know that does not mean that they will not receive a bill. Pt aware of appt date and time.

## 2012-12-31 NOTE — Patient Instructions (Addendum)
You have been given a separate informational sheet regarding your tobacco use, the importance of quitting and local resources to help you quit.  You have been scheduled for a gastric emptying scan at [facility] on 01/14/13 at 9:00am. Please arrive at least 15 minutes prior to your appointment for registration. Please make certain not to have anything to eat or drink after midnight the night before your test. Hold all stomach medications (ex: Zofran, phenergan, Reglan) 48 hours prior to your test. If you need to reschedule your appointment, please contact radiology scheduling at [phone]. _____________________________________________________________________ A gastric-emptying study measures how long it takes for food to move through your stomach. There are several ways to measure stomach emptying. In the most common test, you eat food that contains a small amount of radioactive material. A scanner that detects the movement of the radioactive material is placed over your abdomen to monitor the rate at which food leaves your stomach. This test normally takes about 2 hours to complete. _____________________________________________________________________  Martin Singh have been scheduled for a follow up appointment with Dr Yancey Flemings on 02/02/13 @ 9:15 am.  Cc: Dr Yancey Flemings

## 2013-01-01 NOTE — Progress Notes (Signed)
Agree with initial assessment and plans 

## 2013-01-14 ENCOUNTER — Other Ambulatory Visit (HOSPITAL_COMMUNITY): Payer: Self-pay

## 2013-01-20 ENCOUNTER — Encounter (HOSPITAL_COMMUNITY)
Admission: RE | Admit: 2013-01-20 | Discharge: 2013-01-20 | Disposition: A | Discharge status: Home / Self Care | Payer: Self-pay | Source: Ambulatory Visit | Attending: Nurse Practitioner | Admitting: Nurse Practitioner

## 2013-01-20 DIAGNOSIS — R112 Nausea with vomiting, unspecified: Secondary | ICD-10-CM | POA: Insufficient documentation

## 2013-01-20 DIAGNOSIS — R141 Gas pain: Secondary | ICD-10-CM | POA: Insufficient documentation

## 2013-01-20 DIAGNOSIS — R140 Abdominal distension (gaseous): Secondary | ICD-10-CM

## 2013-01-20 DIAGNOSIS — R142 Eructation: Secondary | ICD-10-CM | POA: Insufficient documentation

## 2013-01-20 MED ORDER — TECHNETIUM TC 99M SULFUR COLLOID
2.0000 | Freq: Once | INTRAVENOUS | Status: AC | PRN
  Administered 2013-01-20: 2 via INTRAVENOUS

## 2013-02-02 ENCOUNTER — Ambulatory Visit: Payer: Self-pay | Admitting: Internal Medicine

## 2013-03-04 ENCOUNTER — Emergency Department (HOSPITAL_COMMUNITY): Payer: Self-pay

## 2013-03-04 ENCOUNTER — Emergency Department (HOSPITAL_COMMUNITY)
Admission: EM | Admit: 2013-03-04 | Discharge: 2013-03-04 | Disposition: A | Discharge status: Home / Self Care | Payer: Self-pay | Attending: Emergency Medicine | Admitting: Emergency Medicine

## 2013-03-04 ENCOUNTER — Encounter (HOSPITAL_COMMUNITY): Payer: Self-pay | Admitting: Emergency Medicine

## 2013-03-04 DIAGNOSIS — Z8679 Personal history of other diseases of the circulatory system: Secondary | ICD-10-CM | POA: Insufficient documentation

## 2013-03-04 DIAGNOSIS — F3289 Other specified depressive episodes: Secondary | ICD-10-CM | POA: Insufficient documentation

## 2013-03-04 DIAGNOSIS — I10 Essential (primary) hypertension: Secondary | ICD-10-CM | POA: Insufficient documentation

## 2013-03-04 DIAGNOSIS — Z87891 Personal history of nicotine dependence: Secondary | ICD-10-CM | POA: Insufficient documentation

## 2013-03-04 DIAGNOSIS — F329 Major depressive disorder, single episode, unspecified: Secondary | ICD-10-CM | POA: Insufficient documentation

## 2013-03-04 DIAGNOSIS — G40909 Epilepsy, unspecified, not intractable, without status epilepticus: Secondary | ICD-10-CM | POA: Insufficient documentation

## 2013-03-04 DIAGNOSIS — Z8709 Personal history of other diseases of the respiratory system: Secondary | ICD-10-CM | POA: Insufficient documentation

## 2013-03-04 DIAGNOSIS — M1990 Unspecified osteoarthritis, unspecified site: Secondary | ICD-10-CM | POA: Insufficient documentation

## 2013-03-04 DIAGNOSIS — R42 Dizziness and giddiness: Secondary | ICD-10-CM | POA: Insufficient documentation

## 2013-03-04 DIAGNOSIS — F411 Generalized anxiety disorder: Secondary | ICD-10-CM | POA: Insufficient documentation

## 2013-03-04 DIAGNOSIS — J45909 Unspecified asthma, uncomplicated: Secondary | ICD-10-CM | POA: Insufficient documentation

## 2013-03-04 DIAGNOSIS — Z8701 Personal history of pneumonia (recurrent): Secondary | ICD-10-CM | POA: Insufficient documentation

## 2013-03-04 DIAGNOSIS — F29 Unspecified psychosis not due to a substance or known physiological condition: Secondary | ICD-10-CM | POA: Insufficient documentation

## 2013-03-04 DIAGNOSIS — Z8719 Personal history of other diseases of the digestive system: Secondary | ICD-10-CM | POA: Insufficient documentation

## 2013-03-04 DIAGNOSIS — Z8669 Personal history of other diseases of the nervous system and sense organs: Secondary | ICD-10-CM | POA: Insufficient documentation

## 2013-03-04 DIAGNOSIS — R51 Headache: Secondary | ICD-10-CM | POA: Insufficient documentation

## 2013-03-04 DIAGNOSIS — Z7982 Long term (current) use of aspirin: Secondary | ICD-10-CM | POA: Insufficient documentation

## 2013-03-04 DIAGNOSIS — Z79899 Other long term (current) drug therapy: Secondary | ICD-10-CM | POA: Insufficient documentation

## 2013-03-04 DIAGNOSIS — R112 Nausea with vomiting, unspecified: Secondary | ICD-10-CM | POA: Insufficient documentation

## 2013-03-04 DIAGNOSIS — H538 Other visual disturbances: Secondary | ICD-10-CM | POA: Insufficient documentation

## 2013-03-04 DIAGNOSIS — K219 Gastro-esophageal reflux disease without esophagitis: Secondary | ICD-10-CM | POA: Insufficient documentation

## 2013-03-04 LAB — BASIC METABOLIC PANEL
BUN: 19 mg/dL (ref 6–23)
CO2: 26 mEq/L (ref 19–32)
Chloride: 103 mEq/L (ref 96–112)
Creatinine, Ser: 1.3 mg/dL (ref 0.50–1.35)
Glucose, Bld: 93 mg/dL (ref 70–99)

## 2013-03-04 LAB — CBC
HCT: 38.1 % — ABNORMAL LOW (ref 39.0–52.0)
Hemoglobin: 13 g/dL (ref 13.0–17.0)
MCV: 90.5 fL (ref 78.0–100.0)
RBC: 4.21 MIL/uL — ABNORMAL LOW (ref 4.22–5.81)
WBC: 4.9 10*3/uL (ref 4.0–10.5)

## 2013-03-04 LAB — URINALYSIS, ROUTINE W REFLEX MICROSCOPIC
Bilirubin Urine: NEGATIVE
Hgb urine dipstick: NEGATIVE
Nitrite: NEGATIVE
Specific Gravity, Urine: 1.016 (ref 1.005–1.030)
pH: 6 (ref 5.0–8.0)

## 2013-03-04 LAB — RAPID URINE DRUG SCREEN, HOSP PERFORMED
Cocaine: NOT DETECTED
Opiates: NOT DETECTED
Tetrahydrocannabinol: NOT DETECTED

## 2013-03-04 LAB — ETHANOL: Alcohol, Ethyl (B): <11 mg/dL (ref 0–11)

## 2013-03-04 MED ORDER — DIPHENHYDRAMINE HCL 50 MG/ML IJ SOLN
12.5000 mg | Freq: Once | INTRAMUSCULAR | Status: AC
  Administered 2013-03-04: 12.5 mg via INTRAVENOUS
  Filled 2013-03-04: qty 1

## 2013-03-04 MED ORDER — KETOROLAC TROMETHAMINE 60 MG/2ML IM SOLN
60.0000 mg | Freq: Once | INTRAMUSCULAR | Status: DC
  Filled 2013-03-04: qty 2

## 2013-03-04 MED ORDER — KETOROLAC TROMETHAMINE 30 MG/ML IJ SOLN
30.0000 mg | Freq: Once | INTRAMUSCULAR | Status: AC
  Administered 2013-03-04: 30 mg via INTRAVENOUS
  Filled 2013-03-04: qty 1

## 2013-03-04 MED ORDER — ONDANSETRON HCL 4 MG/2ML IJ SOLN
4.0000 mg | Freq: Once | INTRAMUSCULAR | Status: AC
  Administered 2013-03-04: 4 mg via INTRAVENOUS
  Filled 2013-03-04: qty 2

## 2013-03-04 MED ORDER — PROCHLORPERAZINE EDISYLATE 5 MG/ML IJ SOLN
10.0000 mg | Freq: Once | INTRAMUSCULAR | Status: AC
  Administered 2013-03-04: 10 mg via INTRAVENOUS
  Filled 2013-03-04: qty 2

## 2013-03-04 NOTE — ED Notes (Signed)
Pt states that he had a seizure this morning around 0100 that lasted about two minutes, has not had one since. Now states he has a headache that has been going on for 3 weeks non stop along with nausea.

## 2013-03-04 NOTE — ED Notes (Signed)
Patient transported to CT 

## 2013-03-04 NOTE — ED Notes (Signed)
Pt reports still having a headache that is rated a 5/10.  Pt also requesting something to drink.

## 2013-03-04 NOTE — ED Notes (Signed)
PA at bedside.

## 2013-03-04 NOTE — ED Provider Notes (Signed)
Medical screening examination/treatment/procedure(s) were performed by non-physician practitioner and as supervising physician I was immediately available for consultation/collaboration.    Nelia Shi, MD 03/04/13 2010

## 2013-03-04 NOTE — ED Notes (Signed)
Patient reports dizziness, blurred vision, tunnel vision. Patient's left hand tremors when held out.

## 2013-03-04 NOTE — ED Provider Notes (Signed)
History    CSN: [id number] Arrival date & time 03/04/13  1542  First MD Initiated Contact with Patient 03/04/13 1730     Chief Complaint  Patient presents with  . Migraine  . Seizures   (Consider location/radiation/quality/duration/timing/severity/associated sxs/prior Treatment) HPI Comments: 47 year old male with an extensive past medical history including seizures, alcohol abuse, anxiety, depression, hypertension among multiple other medical problems presents to the emergency department stating he had a seizure around 1:00 am today while he was laying in bed. States he felt himself tense and "seize" for about two minutes. Denies hitting his head or LOC. Admits to increased seizure activity over the past few weeks, saw his PCP 1 week ago who increased his tegretol to three times daily, however still had a seizure today. Also complaining of worsening headache over the past 3 weeks, constant, throbbing, unrelieved by OTC medication. Admits to associated nausea, dizziness, blurred vision, occasional confusion. Denies vomiting, fever or chills. Denies alcohol or drug use.  Patient is a 47 y.o. male presenting with migraines and seizures. The history is provided by the patient.  Migraine Associated symptoms include headaches, nausea and vomiting. Pertinent negatives include no abdominal pain, chest pain, chills, fever or neck pain.  Seizures  Past Medical History  Diagnosis Date  . Hypertension   . Depressive disorder, not elsewhere classified   . Osteoarthrosis, unspecified whether generalized or localized, unspecified site   . Irritable bowel syndrome   . Esophageal reflux   . Allergic rhinitis   . History of pneumonia   . Insomnia   . Anxiety   . Diverticulosis   . Alcohol abuse 2012  . Benzodiazepine abuse   . Seizures   . Alcoholic pancreatitis   . Erosive esophagitis   . Periumbilical hernia 12/13/12  . Abnormal LFTs   . Fatty liver 10/08/11  . Internal hemorrhoids 03/13/11   . Hiatal hernia 03/13/11  . Asthmatic bronchitis   . Astigmatism    Past Surgical History  Procedure Laterality Date  . Tibula surgery Right   . Fibula fracture surgery Right    Family History  Problem Relation Age of Onset  . Heart disease Mother   . Gallbladder disease Mother   . Nephrolithiasis Mother   . Colon cancer Neg Hx   . Hypertension Father     moth  . Arthritis Mother   . Hyperlipidemia Mother   . Stroke Mother   . Irritable bowel syndrome Mother   . Heart disease Father   . Cancer Mother     vulva  . Irritable bowel syndrome Brother     x 2  . Arthritis Father    History  Substance Use Topics  . Smoking status: Former Smoker -- 1.50 packs/day for 18 years    Types: Cigarettes    Quit date: 08/28/1991  . Smokeless tobacco: Current User    Types: Chew  . Alcohol Use: No     Comment: everyday 1/2 to 1 pint  daily- last drink 1 month ago    Review of Systems  Constitutional: Negative for fever and chills.  HENT: Negative for neck pain and neck stiffness.   Eyes: Positive for visual disturbance.  Respiratory: Negative for shortness of breath.   Cardiovascular: Negative for chest pain.  Gastrointestinal: Positive for nausea and vomiting. Negative for abdominal pain.  Musculoskeletal: Negative for back pain.  Neurological: Positive for dizziness, seizures and headaches. Negative for syncope.  Psychiatric/Behavioral: Positive for confusion.    Allergies  Codeine; Vicodin;  Benazepril; Ativan; and Corticosteroids  Home Medications   Current Outpatient Rx  Name  Route  Sig  Dispense  Refill  . albuterol (PROVENTIL HFA;VENTOLIN HFA) 108 (90 BASE) MCG/ACT inhaler   Inhalation   Inhale 2 puffs into the lungs every 6 (six) hours as needed. For shortness of breath.         Marland Kitchen aspirin 81 MG chewable tablet   Oral   Chew 81 mg by mouth daily.         Marland Kitchen atenolol (TENORMIN) 50 MG tablet   Oral   Take 50 mg by mouth daily.          . carbamazepine  (TEGRETOL) 200 MG tablet   Oral   Take 200 mg by mouth 2 (two) times daily.         . cloNIDine (CATAPRES) 0.1 MG tablet   Oral   Take 0.1 mg by mouth 2 (two) times daily.          Marland Kitchen FLUoxetine (PROZAC) 20 MG capsule   Oral   Take 40 mg by mouth daily.         Marland Kitchen gabapentin (NEURONTIN) 300 MG capsule   Oral   Take 300 mg by mouth 2 (two) times daily.         Marland Kitchen gemfibrozil (LOPID) 600 MG tablet   Oral   Take 600 mg by mouth 2 (two) times daily before a meal.         . hydrochlorothiazide (HYDRODIURIL) 25 MG tablet   Oral   Take 1 tablet (25 mg total) by mouth daily. For high blood pressure   30 tablet   0   . hydrOXYzine (ATARAX/VISTARIL) 25 MG tablet   Oral   Take 25 mg by mouth 2 (two) times daily as needed for anxiety. For anxiety.         . naproxen (NAPROSYN) 500 MG tablet   Oral   Take 500 mg by mouth 2 (two) times daily as needed (pain).         Marland Kitchen omeprazole (PRILOSEC) 20 MG capsule   Oral   Take 20 mg by mouth 2 (two) times daily.         . promethazine (PHENERGAN) 25 MG tablet   Oral   Take 25 mg by mouth every 8 (eight) hours as needed for nausea.         . ranitidine (ZANTAC) 150 MG tablet   Oral   Take 150 mg by mouth 2 (two) times daily.          BP 148/92  Pulse 73  Temp(Src) 98.9 F (37.2 C) (Oral)  Resp 20  SpO2 99% Physical Exam  Nursing note and vitals reviewed. Constitutional: He is oriented to person, place, and time. He appears well-developed and well-nourished. No distress.  HENT:  Head: Normocephalic and atraumatic.  Mouth/Throat: Oropharynx is clear and moist.  Eyes: Conjunctivae and EOM are normal. Pupils are equal, round, and reactive to light.  Neck: Normal range of motion. Neck supple.  Cardiovascular: Normal rate, regular rhythm, normal heart sounds and intact distal pulses.   Pulmonary/Chest: Effort normal and breath sounds normal. No respiratory distress. He has no wheezes. He has no rales.   Musculoskeletal: Normal range of motion. He exhibits no edema.  Neurological: He is alert and oriented to person, place, and time. He has normal strength. No cranial nerve deficit or sensory deficit. He displays a negative Romberg sign. Coordination and gait normal.  Bilateral  hands shaky.  Skin: Skin is warm and dry. He is not diaphoretic.  Psychiatric: His behavior is normal. His mood appears anxious.    ED Course  Procedures (including critical care time) Labs Reviewed  CBC  BASIC METABOLIC PANEL   Date: 03/04/2013  Rate: 67  Rhythm: normal sinus rhythm  QRS Axis: normal  Intervals: normal  ST/T Wave abnormalities: normal  Conduction Disutrbances:none  Narrative Interpretation: no stemi, normal EKG  Old EKG Reviewed: unchanged   Ct Head Wo Contrast  03/04/2013   *RADIOLOGY REPORT*  Clinical Data: Migraine, seizures.  CT HEAD WITHOUT CONTRAST  Technique:  Contiguous axial images were obtained from the base of the skull through the vertex without contrast.  Comparison: None.  Findings: No acute intracranial abnormality.  Specifically, no hemorrhage, hydrocephalus, mass lesion, acute infarction, or significant intracranial injury.  No acute calvarial abnormality.  Mucous retention cyst within the floor of the right maxillary sinus.  Otherwise paranasal sinuses and mastoids clear.  Orbital soft tissues unremarkable.  IMPRESSION: No intracranial abnormality.   Original Report Authenticated By: Charlett Nose, M.D.   No diagnosis found.  MDM  Patient with known history of seizures, increased seizure activity, PCP increased tegretol, 1 seizure this morning. States he has been dizzy, confused. CT head obtained to r/o acute changes- CT head negative. Awaiting labs- cbc, bmp, etoh, ua, uds. Patient very anxious. 8:03 PM After patient told normal CT results, he had a huge sigh of relief and states he is beginning to feel better with his headache. He did receive toradol. Awaiting UA and UDS.  Patient discussed with Schinlever, PA-C at shift change who will assume care at this time. Plan to discahrge patient home.  Trevor Mace, PA-C 03/04/13 2004

## 2013-03-04 NOTE — ED Provider Notes (Signed)
Pt received from [place], [place].  Pt presented to ED w/ headache and increased frequency of typical seizures.  Work-up negative in ED.  Headache resolved w/ IV toradol but returned.  I treated w/ IV compazine.  He reports feeling better.  D/c'd home w/ referral to neuro.  Return precautions discussed. 9:45 PM   Otilio Miu, PA-C 03/04/13 2145

## 2013-03-04 NOTE — ED Notes (Signed)
Pt given a sprite 

## 2013-03-10 ENCOUNTER — Other Ambulatory Visit (INDEPENDENT_AMBULATORY_CARE_PROVIDER_SITE_OTHER): Payer: PRIVATE HEALTH INSURANCE

## 2013-03-10 ENCOUNTER — Encounter: Payer: Self-pay | Admitting: Internal Medicine

## 2013-03-10 ENCOUNTER — Ambulatory Visit (INDEPENDENT_AMBULATORY_CARE_PROVIDER_SITE_OTHER): Payer: Self-pay | Admitting: Internal Medicine

## 2013-03-10 VITALS — BP 124/80 | HR 72 | Temp 97.7°F | Resp 16 | Wt 175.0 lb

## 2013-03-10 DIAGNOSIS — E785 Hyperlipidemia, unspecified: Secondary | ICD-10-CM

## 2013-03-10 DIAGNOSIS — I10 Essential (primary) hypertension: Secondary | ICD-10-CM

## 2013-03-10 DIAGNOSIS — R51 Headache: Secondary | ICD-10-CM

## 2013-03-10 LAB — LIPID PANEL
Cholesterol: 268 mg/dL — ABNORMAL HIGH (ref 0–200)
HDL: 56.6 mg/dL (ref >39.00)
Total CHOL/HDL Ratio: 5
Triglycerides: 435 mg/dL — ABNORMAL HIGH (ref 0.0–149.0)

## 2013-03-10 MED ORDER — NORTRIPTYLINE HCL 10 MG PO CAPS: 10.0000 mg | ORAL_CAPSULE | Freq: Every day | ORAL | Status: DC

## 2013-03-10 NOTE — Assessment & Plan Note (Signed)
CT is normal, recent labs normal ESR is neg for vasculitis I have asked him to start pamelor for the HA and insomnia He must avoid narcotics due to his history of alcohol and substance abuse

## 2013-03-10 NOTE — Assessment & Plan Note (Signed)
Repeat FLP today shows marked improvement in the LDL Trigs are still high so he will work on his lifestyle modifications He is doing well on gemfib with no s/s of myopathy

## 2013-03-10 NOTE — Patient Instructions (Signed)
General Headache Without Cause A headache is pain or discomfort felt around the head or neck area. The specific cause of a headache may not be found. There are many causes and types of headaches. A few common ones are:  Tension headaches.  Migraine headaches.  Cluster headaches.  Chronic daily headaches. HOME CARE INSTRUCTIONS   Keep all follow-up appointments with your caregiver or any specialist referral.  Only take over-the-counter or prescription medicines for pain or discomfort as directed by your caregiver.  Lie down in a dark, quiet room when you have a headache.  Keep a headache journal to find out what may trigger your migraine headaches. For example, write down:  What you eat and drink.  How much sleep you get.  Any change to your diet or medicines.  Try massage or other relaxation techniques.  Put ice packs or heat on the head and neck. Use these 3 to 4 times per day for 15 to 20 minutes each time, or as needed.  Limit stress.  Sit up straight, and do not tense your muscles.  Quit smoking if you smoke.  Limit alcohol use.  Decrease the amount of caffeine you drink, or stop drinking caffeine.  Eat and sleep on a regular schedule.  Get 7 to 9 hours of sleep, or as recommended by your caregiver.  Keep lights dim if bright lights bother you and make your headaches worse. SEEK MEDICAL CARE IF:   You have problems with the medicines you were prescribed.  Your medicines are not working.  You have a change from the usual headache.  You have nausea or vomiting. SEEK IMMEDIATE MEDICAL CARE IF:   Your headache becomes severe.  You have a fever.  You have a stiff neck.  You have loss of vision.  You have muscular weakness or loss of muscle control.  You start losing your balance or have trouble walking.  You feel faint or pass out.  You have severe symptoms that are different from your first symptoms. MAKE SURE YOU:   Understand these  instructions.  Will watch your condition.  Will get help right away if you are not doing well or get worse. Document Released: 08/13/2005 Document Revised: 11/05/2011 Document Reviewed: 08/29/2011 [employer] Patient Information 2014 [employer].  

## 2013-03-10 NOTE — Assessment & Plan Note (Signed)
His BP is well controlled 

## 2013-03-10 NOTE — Progress Notes (Signed)
Subjective:    Patient ID: Martin Singh, male    DOB: [birth year], 47 y.o.   MRN: [id number]  Headache  This is a recurrent problem. The current episode started 1 to 4 weeks ago. The problem occurs intermittently. The problem has been unchanged. The pain is located in the bilateral, frontal and occipital region. The pain does not radiate. The pain quality is similar to prior headaches. The quality of the pain is described as squeezing. The pain is at a severity of 2/10. The pain is mild. Associated symptoms include blurred vision, dizziness, insomnia, nausea and photophobia. Pertinent negatives include no abdominal pain, abnormal behavior, anorexia, back pain, coughing, drainage, ear pain, eye pain, eye redness, facial sweating, fever, hearing loss, loss of balance, muscle aches, neck pain, numbness, phonophobia, rhinorrhea, scalp tenderness, seizures, sinus pressure, sore throat, swollen glands, tingling, tinnitus, visual change, vomiting, weakness or weight loss. He has tried nothing for the symptoms. The treatment provided no relief. His past medical history is significant for hypertension and obesity. There is no history of cancer, migraine headaches, recent head traumas or sinus disease.      Review of Systems  Constitutional: Negative.  Negative for fever, chills, weight loss, diaphoresis, activity change, appetite change, fatigue and unexpected weight change.  HENT: Negative for hearing loss, ear pain, sore throat, rhinorrhea, neck pain, sinus pressure and tinnitus.   Eyes: Positive for blurred vision and photophobia. Negative for pain and redness.  Respiratory: Negative.  Negative for cough, chest tightness, shortness of breath, wheezing and stridor.   Cardiovascular: Negative.  Negative for chest pain, palpitations and leg swelling.  Gastrointestinal: Positive for nausea. Negative for vomiting, abdominal pain, diarrhea, constipation and anorexia.  Endocrine: Negative.   Genitourinary:  Negative.   Musculoskeletal: Negative.  Negative for myalgias, back pain, joint swelling and arthralgias.  Skin: Negative.   Neurological: Positive for dizziness and headaches. Negative for tingling, seizures, weakness, numbness and loss of balance.  Hematological: Negative.  Negative for adenopathy. Does not bruise/bleed easily.  Psychiatric/Behavioral: Positive for sleep disturbance and dysphoric mood. Negative for suicidal ideas, hallucinations, behavioral problems, confusion, self-injury, decreased concentration and agitation. The patient is nervous/anxious and has insomnia. The patient is not hyperactive.        Objective:   Physical Exam  Vitals reviewed. Constitutional: He is oriented to person, place, and time. He appears well-developed and well-nourished.  Non-toxic appearance. He does not have a sickly appearance. He does not appear ill. No distress.  HENT:  Head: Normocephalic and atraumatic.  Mouth/Throat: Oropharynx is clear and moist. No oropharyngeal exudate.  Eyes: Conjunctivae and EOM are normal. Pupils are equal, round, and reactive to light. Right eye exhibits no discharge. Left eye exhibits no discharge. No scleral icterus.  Neck: Normal range of motion. Neck supple. No JVD present. No tracheal deviation present. No thyromegaly present.  Cardiovascular: Normal rate, regular rhythm, normal heart sounds and intact distal pulses.  Exam reveals no gallop and no friction rub.   No murmur heard. Pulmonary/Chest: Effort normal and breath sounds normal. No stridor. No respiratory distress. He has no wheezes. He has no rales. He exhibits no tenderness.  Abdominal: Soft. Bowel sounds are normal. He exhibits no distension and no mass. There is no tenderness. There is no rebound and no guarding.  Musculoskeletal: Normal range of motion. He exhibits no edema and no tenderness.  Lymphadenopathy:    He has no cervical adenopathy.  Neurological: He is alert and oriented to person, place,  and time.  He has normal strength. He displays no atrophy, no tremor and normal reflexes. No cranial nerve deficit or sensory deficit. He exhibits normal muscle tone. He displays a negative Romberg sign. He displays no seizure activity. Coordination and gait normal. He displays no Babinski's sign on the right side. He displays no Babinski's sign on the left side.  Reflex Scores:      Tricep reflexes are 1+ on the right side and 1+ on the left side.      Bicep reflexes are 1+ on the right side and 1+ on the left side.      Brachioradialis reflexes are 1+ on the right side and 1+ on the left side.      Patellar reflexes are 1+ on the right side and 1+ on the left side.      Achilles reflexes are 1+ on the right side and 1+ on the left side. Skin: Skin is warm and dry. No rash noted. He is not diaphoretic. No erythema. No pallor.  Psychiatric: He has a normal mood and affect. His behavior is normal. Judgment and thought content normal.    Lab Results  Component Value Date   WBC 4.9 03/04/2013   HGB 13.0 03/04/2013   HCT 38.1* 03/04/2013   PLT 300 03/04/2013   GLUCOSE 93 03/04/2013   CHOL 609* 09/20/2011   TRIG 669.0* 09/20/2011   HDL 20.00* 09/20/2011   LDLDIRECT 484.0 09/20/2011   LDLCALC  Value: 34        Total Cholesterol/HDL:CHD Risk Coronary Heart Disease Risk Table                     Men   Women  1/2 Average Risk   3.4   3.3  Average Risk       5.0   4.4  2 X Average Risk   9.6   7.1  3 X Average Risk  23.4   11.0        Use the calculated Patient Ratio above and the CHD Risk Table to determine the patient's CHD Risk.        ATP III CLASSIFICATION (LDL):  <100     mg/dL   Optimal  454-098  mg/dL   Near or Above                    Optimal  130-159  mg/dL   Borderline  119-147  mg/dL   High  >829     mg/dL   Very High 5/62/1308   ALT 22 12/13/2012   AST 27 12/13/2012   NA 142 03/04/2013   K 3.9 03/04/2013   CL 103 03/04/2013   CREATININE 1.30 03/04/2013   BUN 19 03/04/2013   CO2 26 03/04/2013   TSH 2.650  01/22/2012   INR 1.05 05/07/2010        Assessment & Plan:

## 2013-03-16 ENCOUNTER — Ambulatory Visit (INDEPENDENT_AMBULATORY_CARE_PROVIDER_SITE_OTHER): Payer: Self-pay | Admitting: Internal Medicine

## 2013-03-16 ENCOUNTER — Encounter (HOSPITAL_COMMUNITY): Payer: Self-pay | Admitting: Emergency Medicine

## 2013-03-16 ENCOUNTER — Emergency Department (HOSPITAL_COMMUNITY)
Admission: EM | Admit: 2013-03-16 | Discharge: 2013-03-17 | Disposition: A | Discharge status: Other Acute Inpatient Hospital | Payer: Self-pay | Attending: Emergency Medicine | Admitting: Emergency Medicine

## 2013-03-16 ENCOUNTER — Encounter: Payer: Self-pay | Admitting: Internal Medicine

## 2013-03-16 VITALS — BP 130/90 | HR 75 | Temp 98.6°F | Resp 16 | Wt 179.0 lb

## 2013-03-16 DIAGNOSIS — R45850 Homicidal ideations: Secondary | ICD-10-CM | POA: Insufficient documentation

## 2013-03-16 DIAGNOSIS — Z8659 Personal history of other mental and behavioral disorders: Secondary | ICD-10-CM | POA: Insufficient documentation

## 2013-03-16 DIAGNOSIS — Z8719 Personal history of other diseases of the digestive system: Secondary | ICD-10-CM | POA: Insufficient documentation

## 2013-03-16 DIAGNOSIS — R51 Headache: Secondary | ICD-10-CM | POA: Insufficient documentation

## 2013-03-16 DIAGNOSIS — Z8701 Personal history of pneumonia (recurrent): Secondary | ICD-10-CM | POA: Insufficient documentation

## 2013-03-16 DIAGNOSIS — F3289 Other specified depressive episodes: Secondary | ICD-10-CM | POA: Insufficient documentation

## 2013-03-16 DIAGNOSIS — K219 Gastro-esophageal reflux disease without esophagitis: Secondary | ICD-10-CM | POA: Insufficient documentation

## 2013-03-16 DIAGNOSIS — G4700 Insomnia, unspecified: Secondary | ICD-10-CM | POA: Insufficient documentation

## 2013-03-16 DIAGNOSIS — Z7982 Long term (current) use of aspirin: Secondary | ICD-10-CM | POA: Insufficient documentation

## 2013-03-16 DIAGNOSIS — Z87891 Personal history of nicotine dependence: Secondary | ICD-10-CM | POA: Insufficient documentation

## 2013-03-16 DIAGNOSIS — F411 Generalized anxiety disorder: Secondary | ICD-10-CM | POA: Insufficient documentation

## 2013-03-16 DIAGNOSIS — Z8669 Personal history of other diseases of the nervous system and sense organs: Secondary | ICD-10-CM | POA: Insufficient documentation

## 2013-03-16 DIAGNOSIS — F329 Major depressive disorder, single episode, unspecified: Secondary | ICD-10-CM

## 2013-03-16 DIAGNOSIS — R45851 Suicidal ideations: Secondary | ICD-10-CM | POA: Insufficient documentation

## 2013-03-16 DIAGNOSIS — Z79899 Other long term (current) drug therapy: Secondary | ICD-10-CM | POA: Insufficient documentation

## 2013-03-16 DIAGNOSIS — M1990 Unspecified osteoarthritis, unspecified site: Secondary | ICD-10-CM | POA: Insufficient documentation

## 2013-03-16 DIAGNOSIS — F1020 Alcohol dependence, uncomplicated: Secondary | ICD-10-CM | POA: Insufficient documentation

## 2013-03-16 DIAGNOSIS — F1021 Alcohol dependence, in remission: Secondary | ICD-10-CM | POA: Insufficient documentation

## 2013-03-16 DIAGNOSIS — I10 Essential (primary) hypertension: Secondary | ICD-10-CM | POA: Insufficient documentation

## 2013-03-16 LAB — CBC
HCT: 37.3 % — ABNORMAL LOW (ref 39.0–52.0)
MCH: 31.2 pg (ref 26.0–34.0)
MCV: 90.3 fL (ref 78.0–100.0)
RDW: 12.3 % (ref 11.5–15.5)
WBC: 3.2 10*3/uL — ABNORMAL LOW (ref 4.0–10.5)

## 2013-03-16 LAB — COMPREHENSIVE METABOLIC PANEL
Albumin: 4.5 g/dL (ref 3.5–5.2)
BUN: 23 mg/dL (ref 6–23)
CO2: 27 mEq/L (ref 19–32)
Calcium: 9.8 mg/dL (ref 8.4–10.5)
Chloride: 98 mEq/L (ref 96–112)
Creatinine, Ser: 1.2 mg/dL (ref 0.50–1.35)
GFR calc non Af Amer: 70 mL/min — ABNORMAL LOW (ref >90)
Total Bilirubin: 0.3 mg/dL (ref 0.3–1.2)

## 2013-03-16 LAB — ETHANOL: Alcohol, Ethyl (B): <11 mg/dL (ref 0–11)

## 2013-03-16 LAB — RAPID URINE DRUG SCREEN, HOSP PERFORMED
Cocaine: NOT DETECTED
Opiates: NOT DETECTED

## 2013-03-16 MED ORDER — HYDROCHLOROTHIAZIDE 25 MG PO TABS
25.0000 mg | ORAL_TABLET | Freq: Every day | ORAL | Status: DC
  Administered 2013-03-17: 25 mg via ORAL
  Filled 2013-03-16: qty 1

## 2013-03-16 MED ORDER — CLONIDINE HCL 0.1 MG PO TABS
0.1000 mg | ORAL_TABLET | Freq: Two times a day (BID) | ORAL | Status: DC
  Administered 2013-03-16 – 2013-03-17 (×2): 0.1 mg via ORAL
  Filled 2013-03-16 (×2): qty 1

## 2013-03-16 MED ORDER — IBUPROFEN 200 MG PO TABS
600.0000 mg | ORAL_TABLET | Freq: Three times a day (TID) | ORAL | Status: DC | PRN
  Administered 2013-03-16: 600 mg via ORAL
  Filled 2013-03-16: qty 3

## 2013-03-16 MED ORDER — ASPIRIN 81 MG PO CHEW
81.0000 mg | CHEWABLE_TABLET | Freq: Every day | ORAL | Status: DC
Start: 2013-03-16 — End: 2013-03-17
  Administered 2013-03-16 – 2013-03-17 (×2): 81 mg via ORAL
  Filled 2013-03-16 (×2): qty 1

## 2013-03-16 MED ORDER — GEMFIBROZIL 600 MG PO TABS
600.0000 mg | ORAL_TABLET | Freq: Two times a day (BID) | ORAL | Status: DC
Start: 2013-03-17 — End: 2013-03-17
  Administered 2013-03-17: 600 mg via ORAL
  Filled 2013-03-16 (×3): qty 1

## 2013-03-16 MED ORDER — FAMOTIDINE 20 MG PO TABS: 10.0000 mg | ORAL_TABLET | Freq: Every day | ORAL | Status: DC

## 2013-03-16 MED ORDER — ALUM & MAG HYDROXIDE-SIMETH 200-200-20 MG/5ML PO SUSP: 30.0000 mL | ORAL | Status: DC | PRN

## 2013-03-16 MED ORDER — PANTOPRAZOLE SODIUM 40 MG PO TBEC
40.0000 mg | DELAYED_RELEASE_TABLET | Freq: Every day | ORAL | Status: DC
  Administered 2013-03-16 – 2013-03-17 (×2): 40 mg via ORAL
  Filled 2013-03-16 (×2): qty 1

## 2013-03-16 MED ORDER — PROMETHAZINE HCL 25 MG PO TABS: 25.0000 mg | ORAL_TABLET | Freq: Three times a day (TID) | ORAL | Status: DC | PRN

## 2013-03-16 MED ORDER — ATENOLOL 50 MG PO TABS
50.0000 mg | ORAL_TABLET | Freq: Every day | ORAL | Status: DC
  Administered 2013-03-17: 50 mg via ORAL
  Filled 2013-03-16: qty 1

## 2013-03-16 MED ORDER — ACETAMINOPHEN 325 MG PO TABS
650.0000 mg | ORAL_TABLET | ORAL | Status: DC | PRN
  Administered 2013-03-17: 650 mg via ORAL
  Filled 2013-03-16: qty 2

## 2013-03-16 MED ORDER — FLUOXETINE HCL 20 MG PO CAPS
40.0000 mg | ORAL_CAPSULE | Freq: Every day | ORAL | Status: DC
  Administered 2013-03-17: 40 mg via ORAL
  Filled 2013-03-16: qty 2

## 2013-03-16 MED ORDER — ONDANSETRON HCL 4 MG PO TABS: 4.0000 mg | ORAL_TABLET | Freq: Three times a day (TID) | ORAL | Status: DC | PRN

## 2013-03-16 MED ORDER — GABAPENTIN 300 MG PO CAPS
300.0000 mg | ORAL_CAPSULE | Freq: Two times a day (BID) | ORAL | Status: DC
  Administered 2013-03-16 – 2013-03-17 (×2): 300 mg via ORAL
  Filled 2013-03-16 (×3): qty 1

## 2013-03-16 MED ORDER — NORTRIPTYLINE HCL 10 MG PO CAPS
10.0000 mg | ORAL_CAPSULE | Freq: Every day | ORAL | Status: DC
  Administered 2013-03-16: 10 mg via ORAL
  Filled 2013-03-16 (×2): qty 1

## 2013-03-16 MED ORDER — ALBUTEROL SULFATE HFA 108 (90 BASE) MCG/ACT IN AERS: 2.0000 | INHALATION_SPRAY | Freq: Four times a day (QID) | RESPIRATORY_TRACT | Status: DC | PRN

## 2013-03-16 MED ORDER — NAPROXEN 500 MG PO TABS: 500.0000 mg | ORAL_TABLET | Freq: Two times a day (BID) | ORAL | Status: DC | PRN

## 2013-03-16 MED ORDER — CARBAMAZEPINE 200 MG PO TABS
200.0000 mg | ORAL_TABLET | Freq: Two times a day (BID) | ORAL | Status: DC
  Administered 2013-03-16 – 2013-03-17 (×2): 200 mg via ORAL
  Filled 2013-03-16 (×3): qty 1

## 2013-03-16 MED ORDER — ZOLPIDEM TARTRATE 5 MG PO TABS: 5.0000 mg | ORAL_TABLET | Freq: Every evening | ORAL | Status: DC | PRN

## 2013-03-16 MED ORDER — NICOTINE 21 MG/24HR TD PT24: 21.0000 mg | MEDICATED_PATCH | Freq: Every day | TRANSDERMAL | Status: DC | PRN

## 2013-03-16 MED ORDER — HYDROXYZINE HCL 25 MG PO TABS: 25.0000 mg | ORAL_TABLET | Freq: Two times a day (BID) | ORAL | Status: DC | PRN
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
## 2013-03-16 NOTE — Patient Instructions (Signed)
General Headache Without Cause A headache is pain or discomfort felt around the head or neck area. The specific cause of a headache may not be found. There are many causes and types of headaches. A few common ones are:  Tension headaches.  Migraine headaches.  Cluster headaches.  Chronic daily headaches. HOME CARE INSTRUCTIONS   Keep all follow-up appointments with your caregiver or any specialist referral.  Only take over-the-counter or prescription medicines for pain or discomfort as directed by your caregiver.  Lie down in a dark, quiet room when you have a headache.  Keep a headache journal to find out what may trigger your migraine headaches. For example, write down:  What you eat and drink.  How much sleep you get.  Any change to your diet or medicines.  Try massage or other relaxation techniques.  Put ice packs or heat on the head and neck. Use these 3 to 4 times per day for 15 to 20 minutes each time, or as needed.  Limit stress.  Sit up straight, and do not tense your muscles.  Quit smoking if you smoke.  Limit alcohol use.  Decrease the amount of caffeine you drink, or stop drinking caffeine.  Eat and sleep on a regular schedule.  Get 7 to 9 hours of sleep, or as recommended by your caregiver.  Keep lights dim if bright lights bother you and make your headaches worse. SEEK MEDICAL CARE IF:   You have problems with the medicines you were prescribed.  Your medicines are not working.  You have a change from the usual headache.  You have nausea or vomiting. SEEK IMMEDIATE MEDICAL CARE IF:   Your headache becomes severe.  You have a fever.  You have a stiff neck.  You have loss of vision.  You have muscular weakness or loss of muscle control.  You start losing your balance or have trouble walking.  You feel faint or pass out.  You have severe symptoms that are different from your first symptoms. MAKE SURE YOU:   Understand these  instructions.  Will watch your condition.  Will get help right away if you are not doing well or get worse. Document Released: 08/13/2005 Document Revised: 11/05/2011 Document Reviewed: 08/29/2011 [employer] Patient Information 2014 [employer].  

## 2013-03-16 NOTE — Assessment & Plan Note (Signed)
I don't have anything else to add regarding the diagnosis and treatment of his headache I have asked him to see neurology for further evaluation

## 2013-03-16 NOTE — ED Notes (Signed)
Pt presenting to ed with c/o "I have been under a lot of stress and I feel like I want to hurt myself and others". Pt states this has been going on for a year and he received a phone call 3 weeks ago that he can't have visitation with his son and this has set him off"

## 2013-03-16 NOTE — Progress Notes (Signed)
Subjective:    Patient ID: Martin Singh, male    DOB: 1966/07/09, 47 y.o.   MRN: [id number]  Headache  This is a recurrent problem. The current episode started 1 to 4 weeks ago. The problem occurs intermittently. The problem has been unchanged. The pain is located in the bilateral region. The pain does not radiate. The pain quality is similar to prior headaches. The quality of the pain is described as aching. The pain is at a severity of 2/10. The pain is mild. Pertinent negatives include no abdominal pain, abnormal behavior, anorexia, back pain, blurred vision, eye redness, fever, hearing loss, loss of balance, muscle aches, nausea, neck pain, numbness, scalp tenderness, sinus pressure, tingling, tinnitus, vomiting, weakness or weight loss. Nothing aggravates the symptoms. The treatment provided no relief. His past medical history is significant for obesity.      Review of Systems  Constitutional: Negative.  Negative for fever and weight loss.  HENT: Negative for hearing loss, neck pain, sinus pressure and tinnitus.   Eyes: Negative.  Negative for blurred vision and redness.  Respiratory: Negative.   Cardiovascular: Negative.  Negative for palpitations and leg swelling.  Gastrointestinal: Negative.  Negative for nausea, vomiting, abdominal pain, diarrhea, constipation and anorexia.  Endocrine: Negative.   Genitourinary: Negative.   Musculoskeletal: Negative.  Negative for myalgias, back pain, joint swelling, arthralgias and gait problem.  Skin: Negative.  Negative for color change, pallor, rash and wound.  Allergic/Immunologic: Negative.   Neurological: Positive for headaches. Negative for tingling, weakness, numbness and loss of balance.  Hematological: Negative.  Negative for adenopathy. Does not bruise/bleed easily.  Psychiatric/Behavioral: Positive for sleep disturbance and dysphoric mood. Negative for suicidal ideas, hallucinations, behavioral problems, confusion, self-injury,  decreased concentration and agitation. The patient is nervous/anxious. The patient is not hyperactive.        Objective:   Physical Exam  Constitutional: He is oriented to person, place, and time. He appears well-developed and well-nourished.  Non-toxic appearance. He does not have a sickly appearance. He does not appear ill. No distress.  HENT:  Head: Normocephalic and atraumatic.  Mouth/Throat: Oropharynx is clear and moist. No oropharyngeal exudate.  Eyes: Conjunctivae and EOM are normal. Pupils are equal, round, and reactive to light. Right eye exhibits no discharge. Left eye exhibits no discharge. No scleral icterus.  Neck: Normal range of motion. Neck supple. No JVD present. No tracheal deviation present. No thyromegaly present.  Cardiovascular: Normal rate, regular rhythm, normal heart sounds and intact distal pulses.  Exam reveals no gallop and no friction rub.   No murmur heard. Pulmonary/Chest: Effort normal and breath sounds normal. No stridor. No respiratory distress. He has no wheezes. He has no rales. He exhibits no tenderness.  Abdominal: Soft. Bowel sounds are normal. He exhibits no distension and no mass. There is no tenderness. There is no rebound and no guarding.  Musculoskeletal: Normal range of motion. He exhibits no edema and no tenderness.  Lymphadenopathy:    He has no cervical adenopathy.  Neurological: He is alert and oriented to person, place, and time. He has normal reflexes. He displays normal reflexes. No cranial nerve deficit. He exhibits normal muscle tone. Coordination normal.  Skin: Skin is warm and dry. No rash noted. He is not diaphoretic. No erythema. No pallor.  Psychiatric: He has a normal mood and affect. His behavior is normal. Judgment and thought content normal.    Lab Results  Component Value Date   WBC 4.9 03/04/2013   HGB 13.0  03/04/2013   HCT 38.1* 03/04/2013   PLT 300 03/04/2013   GLUCOSE 93 03/04/2013   CHOL 268* 03/10/2013   TRIG 435.0  Triglyceride is over 400; calculations on Lipids are invalid.* 03/10/2013   HDL 56.60 03/10/2013   LDLDIRECT 143.8 03/10/2013   LDLCALC  Value: 34        Total Cholesterol/HDL:CHD Risk Coronary Heart Disease Risk Table                     Men   Women  1/2 Average Risk   3.4   3.3  Average Risk       5.0   4.4  2 X Average Risk   9.6   7.1  3 X Average Risk  23.4   11.0        Use the calculated Patient Ratio above and the CHD Risk Table to determine the patient's CHD Risk.        ATP III CLASSIFICATION (LDL):  <100     mg/dL   Optimal  161-096  mg/dL   Near or Above                    Optimal  130-159  mg/dL   Borderline  045-409  mg/dL   High  >811     mg/dL   Very High 05/10/7828   ALT 22 12/13/2012   AST 27 12/13/2012   NA 142 03/04/2013   K 3.9 03/04/2013   CL 103 03/04/2013   CREATININE 1.30 03/04/2013   BUN 19 03/04/2013   CO2 26 03/04/2013   TSH 2.09 03/10/2013   INR 1.05 05/07/2010        Assessment & Plan:

## 2013-03-17 ENCOUNTER — Inpatient Hospital Stay (HOSPITAL_COMMUNITY)
Admission: EM | Admit: 2013-03-17 | Discharge: 2013-03-20 | DRG: 885 | Disposition: A | Discharge status: Home / Self Care | Payer: Self-pay | Source: Intra-hospital | Attending: Psychiatry | Admitting: Psychiatry

## 2013-03-17 ENCOUNTER — Encounter (HOSPITAL_COMMUNITY): Payer: Self-pay | Admitting: *Deleted

## 2013-03-17 DIAGNOSIS — F1020 Alcohol dependence, uncomplicated: Secondary | ICD-10-CM

## 2013-03-17 DIAGNOSIS — F39 Unspecified mood [affective] disorder: Secondary | ICD-10-CM

## 2013-03-17 DIAGNOSIS — R45851 Suicidal ideations: Secondary | ICD-10-CM

## 2013-03-17 DIAGNOSIS — F411 Generalized anxiety disorder: Secondary | ICD-10-CM | POA: Diagnosis present

## 2013-03-17 DIAGNOSIS — R45850 Homicidal ideations: Secondary | ICD-10-CM

## 2013-03-17 DIAGNOSIS — F1010 Alcohol abuse, uncomplicated: Secondary | ICD-10-CM | POA: Diagnosis present

## 2013-03-17 DIAGNOSIS — E781 Pure hyperglyceridemia: Secondary | ICD-10-CM

## 2013-03-17 DIAGNOSIS — K859 Acute pancreatitis without necrosis or infection, unspecified: Secondary | ICD-10-CM

## 2013-03-17 DIAGNOSIS — Z79899 Other long term (current) drug therapy: Secondary | ICD-10-CM

## 2013-03-17 DIAGNOSIS — M1990 Unspecified osteoarthritis, unspecified site: Secondary | ICD-10-CM

## 2013-03-17 DIAGNOSIS — F329 Major depressive disorder, single episode, unspecified: Secondary | ICD-10-CM | POA: Diagnosis present

## 2013-03-17 DIAGNOSIS — K219 Gastro-esophageal reflux disease without esophagitis: Secondary | ICD-10-CM | POA: Diagnosis present

## 2013-03-17 DIAGNOSIS — R05 Cough: Secondary | ICD-10-CM

## 2013-03-17 DIAGNOSIS — E785 Hyperlipidemia, unspecified: Secondary | ICD-10-CM

## 2013-03-17 DIAGNOSIS — F3289 Other specified depressive episodes: Secondary | ICD-10-CM | POA: Diagnosis present

## 2013-03-17 DIAGNOSIS — I10 Essential (primary) hypertension: Secondary | ICD-10-CM | POA: Diagnosis present

## 2013-03-17 DIAGNOSIS — F332 Major depressive disorder, recurrent severe without psychotic features: Principal | ICD-10-CM | POA: Diagnosis present

## 2013-03-17 DIAGNOSIS — F1920 Other psychoactive substance dependence, uncomplicated: Secondary | ICD-10-CM

## 2013-03-17 DIAGNOSIS — F32A Depression, unspecified: Secondary | ICD-10-CM | POA: Diagnosis present

## 2013-03-17 DIAGNOSIS — F419 Anxiety disorder, unspecified: Secondary | ICD-10-CM | POA: Diagnosis present

## 2013-03-17 MED ORDER — CLONIDINE HCL 0.1 MG PO TABS
0.1000 mg | ORAL_TABLET | Freq: Two times a day (BID) | ORAL | Status: DC
  Administered 2013-03-17 – 2013-03-20 (×6): 0.1 mg via ORAL
  Filled 2013-03-17: qty 28
  Filled 2013-03-17: qty 1
  Filled 2013-03-17: qty 28
  Filled 2013-03-17 (×7): qty 1

## 2013-03-17 MED ORDER — GEMFIBROZIL 600 MG PO TABS
600.0000 mg | ORAL_TABLET | Freq: Two times a day (BID) | ORAL | Status: DC
  Administered 2013-03-17 – 2013-03-20 (×6): 600 mg via ORAL
  Filled 2013-03-17 (×10): qty 1

## 2013-03-17 MED ORDER — ONDANSETRON HCL 4 MG PO TABS: 4.0000 mg | ORAL_TABLET | Freq: Three times a day (TID) | ORAL | Status: DC | PRN

## 2013-03-17 MED ORDER — MAGNESIUM HYDROXIDE 400 MG/5ML PO SUSP
30.0000 mL | Freq: Every day | ORAL | Status: DC | PRN
  Administered 2013-03-18 – 2013-03-19 (×2): 30 mL via ORAL

## 2013-03-17 MED ORDER — PANTOPRAZOLE SODIUM 40 MG PO TBEC
40.0000 mg | DELAYED_RELEASE_TABLET | Freq: Two times a day (BID) | ORAL | Status: DC
  Administered 2013-03-17 – 2013-03-20 (×6): 40 mg via ORAL
  Filled 2013-03-17: qty 1
  Filled 2013-03-17: qty 6
  Filled 2013-03-17 (×4): qty 1
  Filled 2013-03-17: qty 6
  Filled 2013-03-17 (×3): qty 1

## 2013-03-17 MED ORDER — FLUOXETINE HCL 20 MG PO CAPS
20.0000 mg | ORAL_CAPSULE | Freq: Two times a day (BID) | ORAL | Status: DC
  Administered 2013-03-17 – 2013-03-18 (×2): 20 mg via ORAL
  Filled 2013-03-17 (×6): qty 1

## 2013-03-17 MED ORDER — ASPIRIN EC 81 MG PO TBEC
81.0000 mg | DELAYED_RELEASE_TABLET | Freq: Every day | ORAL | Status: DC
  Administered 2013-03-17 – 2013-03-20 (×4): 81 mg via ORAL
  Filled 2013-03-17 (×6): qty 1

## 2013-03-17 MED ORDER — HYDROCHLOROTHIAZIDE 25 MG PO TABS
25.0000 mg | ORAL_TABLET | Freq: Every day | ORAL | Status: DC
  Administered 2013-03-17 – 2013-03-20 (×4): 25 mg via ORAL
  Filled 2013-03-17 (×6): qty 1

## 2013-03-17 MED ORDER — ALUM & MAG HYDROXIDE-SIMETH 200-200-20 MG/5ML PO SUSP
30.0000 mL | ORAL | Status: DC | PRN
  Administered 2013-03-19: 30 mL via ORAL

## 2013-03-17 MED ORDER — TRAZODONE HCL 50 MG PO TABS
50.0000 mg | ORAL_TABLET | Freq: Every day | ORAL | Status: DC
  Administered 2013-03-17 – 2013-03-19 (×3): 50 mg via ORAL
  Filled 2013-03-17 (×2): qty 1
  Filled 2013-03-17: qty 14
  Filled 2013-03-17 (×3): qty 1

## 2013-03-17 MED ORDER — ALBUTEROL SULFATE HFA 108 (90 BASE) MCG/ACT IN AERS: 2.0000 | INHALATION_SPRAY | RESPIRATORY_TRACT | Status: DC | PRN

## 2013-03-17 MED ORDER — GABAPENTIN 300 MG PO CAPS
300.0000 mg | ORAL_CAPSULE | Freq: Two times a day (BID) | ORAL | Status: DC
  Administered 2013-03-17 – 2013-03-20 (×6): 300 mg via ORAL
  Filled 2013-03-17 (×3): qty 1
  Filled 2013-03-17: qty 28
  Filled 2013-03-17: qty 1
  Filled 2013-03-17: qty 28
  Filled 2013-03-17 (×4): qty 1

## 2013-03-17 MED ORDER — NAPROXEN 500 MG PO TABS
500.0000 mg | ORAL_TABLET | Freq: Two times a day (BID) | ORAL | Status: DC
  Administered 2013-03-17 – 2013-03-20 (×6): 500 mg via ORAL
  Filled 2013-03-17 (×10): qty 1

## 2013-03-17 MED ORDER — ATENOLOL 50 MG PO TABS
50.0000 mg | ORAL_TABLET | Freq: Every day | ORAL | Status: DC
  Administered 2013-03-17 – 2013-03-20 (×4): 50 mg via ORAL
  Filled 2013-03-17 (×6): qty 1

## 2013-03-17 MED ORDER — HYDROXYZINE HCL 25 MG PO TABS: 25.0000 mg | ORAL_TABLET | Freq: Four times a day (QID) | ORAL | Status: DC | PRN

## 2013-03-17 MED ORDER — CARBAMAZEPINE 200 MG PO TABS
200.0000 mg | ORAL_TABLET | Freq: Two times a day (BID) | ORAL | Status: DC
  Administered 2013-03-17 – 2013-03-20 (×6): 200 mg via ORAL
  Filled 2013-03-17 (×3): qty 1
  Filled 2013-03-17: qty 28
  Filled 2013-03-17 (×2): qty 1
  Filled 2013-03-17: qty 28
  Filled 2013-03-17 (×3): qty 1

## 2013-03-17 NOTE — BHH Counselor (Signed)
03-16-13       Accepted by Glennon Hamilton.  Patient accepted to [facility]. The attending psychiatrist is Dr. Elsie Saas. The nursing report # is [phone].

## 2013-03-17 NOTE — Progress Notes (Signed)
[facility] did not see patient but will be sending him information about the [facility], using the address provided.

## 2013-03-17 NOTE — Consult Note (Signed)
Reason for Consult:Eval for IP psychiatric mgmt Referring Physician: Dan Humphreys MD  Martin Singh is an 47 y.o. male.  HPI: Pt is a 47 y/o Wm known to [facility] with prior admission for alcohol detox. The patient has been C/D/S for greater than one year. The patient presents voluntarily requesting IP psychiatric mgmt of mood d/o with passive HI/SI. Stressors include relationship problems with family and estranged son. Patient also unemployed and awaiting disability. Patient rates his depressive sx 7/10 with feelings of hopelessness, helplessness, guilt, racing thoughts, insomnia and anhedonia. Patient is on psychotropic medications Rx per his PCP.  Past Medical History  Diagnosis Date  . Hypertension   . Depressive disorder, not elsewhere classified   . Osteoarthrosis, unspecified whether generalized or localized, unspecified site   . Irritable bowel syndrome   . Esophageal reflux   . Allergic rhinitis   . History of pneumonia   . Insomnia   . Anxiety   . Diverticulosis   . Alcohol abuse 2012  . Benzodiazepine abuse   . Seizures   . Alcoholic pancreatitis   . Erosive esophagitis   . Periumbilical hernia 12/13/12  . Abnormal LFTs   . Fatty liver 10/08/11  . Internal hemorrhoids 03/13/11  . Hiatal hernia 03/13/11  . Asthmatic bronchitis   . Astigmatism     Past Surgical History  Procedure Laterality Date  . Tibula surgery Right   . Fibula fracture surgery Right     Family History  Problem Relation Age of Onset  . Heart disease Mother   . Gallbladder disease Mother   . Nephrolithiasis Mother   . Colon cancer Neg Hx   . Hypertension Father     moth  . Arthritis Mother   . Hyperlipidemia Mother   . Stroke Mother   . Irritable bowel syndrome Mother   . Heart disease Father   . Cancer Mother     vulva  . Irritable bowel syndrome Brother     x 2  . Arthritis Father     Social History:  reports that he quit smoking about 21 years ago. His smoking use included Cigarettes. He has a  27 pack-year smoking history. His smokeless tobacco use includes Chew. He reports that he does not drink alcohol or use illicit drugs.  Allergies:  Allergies  Allergen Reactions  . Codeine Anaphylaxis  . Vicodin (Hydrocodone-Acetaminophen) Anaphylaxis  . Benazepril Cough  . Ativan (Lorazepam) Other (See Comments)    Becomes violent and hallucinates  . Corticosteroids Other (See Comments)    Shaky and high blood pressure    Medications: I have reviewed the patient's current medications.  Results for orders placed during the hospital encounter of 03/16/13 (from the past 48 hour(s))  CBC     Status: Abnormal   Collection Time    03/16/13 10:17 AM      Result Value Range   WBC 3.2 (*) 4.0 - 10.5 K/uL   RBC 4.13 (*) 4.22 - 5.81 MIL/uL   Hemoglobin 12.9 (*) 13.0 - 17.0 g/dL   HCT 16.1 (*) 09.6 - 04.5 %   MCV 90.3  78.0 - 100.0 fL   MCH 31.2  26.0 - 34.0 pg   MCHC 34.6  30.0 - 36.0 g/dL   RDW 40.9  81.1 - 91.4 %   Platelets 265  150 - 400 K/uL  COMPREHENSIVE METABOLIC PANEL     Status: Abnormal   Collection Time    03/16/13 10:17 AM      Result Value  Range   Sodium 137  135 - 145 mEq/L   Potassium 3.5  3.5 - 5.1 mEq/L   Chloride 98  96 - 112 mEq/L   CO2 27  19 - 32 mEq/L   Glucose, Bld 105 (*) 70 - 99 mg/dL   BUN 23  6 - 23 mg/dL   Creatinine, Ser 0.45  0.50 - 1.35 mg/dL   Calcium 9.8  8.4 - 40.9 mg/dL   Total Protein 7.8  6.0 - 8.3 g/dL   Albumin 4.5  3.5 - 5.2 g/dL   AST 23  0 - 37 U/L   ALT 17  0 - 53 U/L   Alkaline Phosphatase 51  39 - 117 U/L   Total Bilirubin 0.3  0.3 - 1.2 mg/dL   GFR calc non Af Amer 70 (*) >90 mL/min   GFR calc Af Amer 82 (*) >90 mL/min   Comment:            The eGFR has been calculated     using the CKD EPI equation.     This calculation has not been     validated in all clinical     situations.     eGFR's persistently     <90 mL/min signify     possible Chronic Kidney Disease.  Martin Singh     Status: None   Collection Time    03/16/13  10:17 AM      Result Value Range   Alcohol, Ethyl (B) <11  0 - 11 mg/dL   Comment:            LOWEST DETECTABLE LIMIT FOR     SERUM ALCOHOL IS 11 mg/dL     FOR MEDICAL PURPOSES ONLY  URINE RAPID DRUG SCREEN (HOSP PERFORMED)     Status: None   Collection Time    03/16/13  4:52 PM      Result Value Range   Opiates NONE DETECTED  NONE DETECTED   Cocaine NONE DETECTED  NONE DETECTED   Benzodiazepines NONE DETECTED  NONE DETECTED   Amphetamines NONE DETECTED  NONE DETECTED   Tetrahydrocannabinol NONE DETECTED  NONE DETECTED   Barbiturates NONE DETECTED  NONE DETECTED   Comment:            DRUG SCREEN FOR MEDICAL PURPOSES     ONLY.  IF CONFIRMATION IS NEEDED     FOR ANY PURPOSE, NOTIFY LAB     WITHIN 5 DAYS.                LOWEST DETECTABLE LIMITS     FOR URINE DRUG SCREEN     Drug Class       Cutoff (ng/mL)     Amphetamine      1000     Barbiturate      200     Benzodiazepine   200     Tricyclics       300     Opiates          300     Cocaine          300     THC              50    No results found.  Review of Systems  Psychiatric/Behavioral: Positive for depression, suicidal ideas and memory loss. Negative for hallucinations and substance abuse. The patient is nervous/anxious and has insomnia.        Patient endorsing racing thoughts, insomnia, SI/Hi but  denying AVH. Rates depressive  Sx 6/10 cannot contract for safety  All other systems reviewed and are negative.   Blood pressure 133/95, pulse 73, temperature 98.1 F (36.7 C), temperature source Oral, resp. rate 18, height 5' (1.524 m), weight 79.379 kg (175 lb), SpO2 97.00%. Physical Exam  Nursing note and vitals reviewed. Constitutional: He is oriented to person, place, and time. He appears well-developed.  HENT:  Head: Normocephalic.  Eyes: Pupils are equal, round, and reactive to light.  Neck: Neck supple.  Respiratory: Breath sounds normal.  GI: Soft.  Neurological: He is alert and oriented to person, place,  and time.  Skin: Skin is warm and dry.  Psychiatric:  Patient with good eye contact, fair insight with normal tone non pressured speech pattern.    Assessment/Plan: 1) Admit 500 hall [facility] for crises mgmt, safety and stabilization for mood d/o with SI/HI 2) Continue current psychotropic regimen in addition to psychotherapeutic interventions 3) Mgmt of co-morbid conditions 4) Social work to aid in [employer] and support services to decrease chance for relapse.  Martin Singh E 03/17/2013, 2:28 AM

## 2013-03-17 NOTE — BHH Group Notes (Signed)
[facility] LCSW Group Therapy  03/17/2013 3:30 PM  Type of Therapy:  Group Therapy  Participation Level:  Did Not Attend  Wynn Banker 03/17/2013, 3:30 PM

## 2013-03-17 NOTE — BHH Suicide Risk Assessment (Signed)
Suicide Risk Assessment  Admission Assessment     Nursing information obtained from:  Patient Demographic factors:  Male;Caucasian;Low socioeconomic status;Unemployed Current Mental Status:  Suicidal ideation indicated by patient;Belief that plan would result in death;Thoughts of violence towards others Loss Factors:  Decrease in vocational status;Loss of significant relationship;Financial problems / change in socioeconomic status Historical Factors:  Prior suicide attempts;Family history of suicide;Family history of mental illness or substance abuse;Impulsivity Risk Reduction Factors:  Responsible for children under 61 years of age;Sense of responsibility to family;Religious beliefs about death;Living with another person, especially a relative;Positive social support  CLINICAL FACTORS:   Severe Anxiety and/or Agitation Depression:   Anhedonia Hopelessness Impulsivity Severe  COGNITIVE FEATURES THAT CONTRIBUTE TO RISK:  Closed-mindedness Polarized thinking Thought constriction (tunnel vision)    SUICIDE RISK:   Moderate:  Frequent suicidal ideation with limited intensity, and duration, some specificity in terms of plans, no associated intent, good self-control, limited dysphoria/symptomatology, some risk factors present, and identifiable protective factors, including available and accessible social support.  PLAN OF CARE: Supportive approach/coping skills/relapse prevention                               Optimize treatment with psychotropics  I certify that inpatient services furnished can reasonably be expected to improve the patient's condition.  Eron Goble A 03/17/2013, 5:21 PM

## 2013-03-17 NOTE — Progress Notes (Signed)
Patient was admitted to [facility] approximately one year ago.  Has had thoughts of SI off/on, contracts for safety.  HI to his sister in law and brother in law who now has custody of patient's 48 year old son.  Patient's wife who is mother of his son left family.  Separated 9 years, divorced this past year.  Larey Seat out of bed because of nightmares this morning at ED.  Bruise on forehead and right lower arm.  History of seizures.  Right lower leg broken bone 2002, surgery, rod/screws.  Has been very upset because of sister in law and brother in law who has custody of his 45 yr old son, has been hitting walls, throwing objects at home.  Medical history of HTN, asthma, arthritis, seizures.  First stated he has been sober 5 years, then stated he has been sober one year.  Has drank alcohol approximately 35 years.   Has used THC and cocaine in past, no THC or cocaine in past year.  No upper teeth, no dentures.  Right ear hearing loss, uses reading glasses.  Chews tobacco, would like nicotine patch, one can tobacco weekly.   Tatoos on right upper arm and left hand tatoo.   Lost job, laid off due to health and anxiety issues 3 years ago.  Applied for disability.   Lives with fiancee who is his care giver.  Allergies to codeine, vicodin, ativan, cortisteroids.   Has OD in the past, went to [place] years ago.  Both brothers have tried to hurt themselves and have alcohol/drug problems.   Patient offered food/drink during admission, oriented to unit. Patient is high fall risk after falling out of bed during ER.  Fall risk information reviewed and given to patient No locker needed, no items brought from ER. Patient has been cooperative and pleasant.

## 2013-03-17 NOTE — Tx Team (Signed)
Initial Interdisciplinary Treatment Plan  PATIENT STRENGTHS: (choose at least two) Ability for insight Average or above average intelligence Capable of independent living Communication skills General fund of knowledge Motivation for treatment/growth Physical Health Religious Affiliation Supportive family/friends  PATIENT STRESSORS: Financial difficulties Marital or family conflict Occupational concerns Substance abuse   PROBLEM LIST: Problem List/Patient Goals Date to be addressed Date deferred Reason deferred Estimated date of resolution  Suicidal ideation 03/17/2013   D/c        Substance abuse 03/17/2013   D/c        depression 03/17/2013   D/c                           DISCHARGE CRITERIA:  Ability to meet basic life and health needs Adequate post-discharge living arrangements Improved stabilization in mood, thinking, and/or behavior Medical problems require only outpatient monitoring Motivation to continue treatment in a less acute level of care Need for constant or close observation no longer present Reduction of life-threatening or endangering symptoms to within safe limits Safe-care adequate arrangements made Verbal commitment to aftercare and medication compliance  PRELIMINARY DISCHARGE PLAN: Attend aftercare/continuing care group Attend PHP/IOP Attend 12-step recovery group Outpatient therapy Participate in family therapy Return to previous living arrangement  PATIENT/FAMIILY INVOLVEMENT: This treatment plan has been presented to and reviewed with the patient, Martin Singh.  The patient and family have been given the opportunity to ask questions and make suggestions.  Quintella Reichert [facility] 03/17/2013, 11:26 AM

## 2013-03-17 NOTE — BH Assessment (Signed)
Assessment Note   Patient is a 47 year old white male.  Patient reports SI and HI.  Patient reports, "I have thoughts about hurting myself but not killing myself," such as "pounding the wall until my hand bleeds."  Patient reports that he has been having thoughts of hurting his family members.   Patient repots feelings of stress and anxiety associated with his relationship problems with his family and estranged son. Patient reports feelings of hopelessness, helplessness, guilt, racing thoughts, insomnia.    Patient reports seeing shadows for the past three months. Patient reports hearing voices.  Patient reports that the voices, "might be telling him to hurt himself or others".  Patient reports that he is compliant with taking is medication.  However, patient reports that his medication is not helping his racing thoughts anymore.    Patient reports that he was, "set off" when he received a phone call informing him that he could not have a visitation with his son. He states "I got really bummed out and really mad" and states "I feel like if I'd seen them, I'd kill them".  Patient reports having very violent dreams and uncontrollable dreams.  Patient reports that his dreams are so uncontrollable that he swung at his fiance in his sleep and "her screaming woke me up."  Patient denies having a plan to hurt himself or others.   Patient reports a prior history of substance abuse.  Patient reports a prior admission to [facility] for alcohol detox.  Patient has been sober for over 1 year.  Patients BAL is <11.  Patients UDS is negative.      Axis I: Bipolar, mixed and Schizoaffective Disorder Axis II: Deferred Axis III:  Past Medical History  Diagnosis Date  . Hypertension   . Depressive disorder, not elsewhere classified   . Osteoarthrosis, unspecified whether generalized or localized, unspecified site   . Irritable bowel syndrome   . Esophageal reflux   . Allergic rhinitis   . History of pneumonia   .  Insomnia   . Anxiety   . Diverticulosis   . Alcohol abuse 2012  . Benzodiazepine abuse   . Seizures   . Alcoholic pancreatitis   . Erosive esophagitis   . Periumbilical hernia 12/13/12  . Abnormal LFTs   . Fatty liver 10/08/11  . Internal hemorrhoids 03/13/11  . Hiatal hernia 03/13/11  . Asthmatic bronchitis   . Astigmatism    Axis IV: economic problems, occupational problems, other psychosocial or environmental problems, problems related to social environment and problems with access to health care services Axis V: 21-30 behavior considerably influenced by delusions or hallucinations OR serious impairment in judgment, communication OR inability to function in almost all areas  Past Medical History:  Past Medical History  Diagnosis Date  . Hypertension   . Depressive disorder, not elsewhere classified   . Osteoarthrosis, unspecified whether generalized or localized, unspecified site   . Irritable bowel syndrome   . Esophageal reflux   . Allergic rhinitis   . History of pneumonia   . Insomnia   . Anxiety   . Diverticulosis   . Alcohol abuse 2012  . Benzodiazepine abuse   . Seizures   . Alcoholic pancreatitis   . Erosive esophagitis   . Periumbilical hernia 12/13/12  . Abnormal LFTs   . Fatty liver 10/08/11  . Internal hemorrhoids 03/13/11  . Hiatal hernia 03/13/11  . Asthmatic bronchitis   . Astigmatism     Past Surgical History  Procedure Laterality  Date  . Tibula surgery Right   . Fibula fracture surgery Right     Family History:  Family History  Problem Relation Age of Onset  . Heart disease Mother   . Gallbladder disease Mother   . Nephrolithiasis Mother   . Colon cancer Neg Hx   . Hypertension Father     moth  . Arthritis Mother   . Hyperlipidemia Mother   . Stroke Mother   . Irritable bowel syndrome Mother   . Heart disease Father   . Cancer Mother     vulva  . Irritable bowel syndrome Brother     x 2  . Arthritis Father     Social History:   reports that he quit smoking about 21 years ago. His smoking use included Cigarettes. He has a 27 pack-year smoking history. His smokeless tobacco use includes Chew. He reports that he does not drink alcohol or use illicit drugs.  Additional Social History:     CIWA: CIWA-Ar BP: 133/95 mmHg Pulse Rate: 73 COWS:    Allergies:  Allergies  Allergen Reactions  . Codeine Anaphylaxis  . Vicodin (Hydrocodone-Acetaminophen) Anaphylaxis  . Benazepril Cough  . Ativan (Lorazepam) Other (See Comments)    Becomes violent and hallucinates  . Corticosteroids Other (See Comments)    Shaky and high blood pressure    Home Medications:  (Not in a hospital admission)  OB/GYN Status:  No LMP for male patient.  General Assessment Data Location of Assessment: [facility] ED ACT Assessment: Yes Living Arrangements: Spouse/significant other Can pt return to current living arrangement?: Yes Admission Status: Voluntary Is patient capable of signing voluntary admission?: Yes Transfer from: Acute Hospital Referral Source: Self/Family/Friend  Education Status Is patient currently in school?: No  Risk to self Suicidal Ideation: Yes-Currently Present Suicidal Intent: No Is patient at risk for suicide?: No Suicidal Plan?: No Access to Means: No What has been your use of drugs/alcohol within the last 12 months?: None  Previous Attempts/Gestures: Yes How many times?: 1 Other Self Harm Risks: None Triggers for Past Attempts: Family contact;Spouse contact;Anniversary;Unpredictable Intentional Self Injurious Behavior: None Family Suicide History: No Recent stressful life event(s): Financial Problems;Conflict (Comment) (Arguing with his wife. ) Persecutory voices/beliefs?: Yes (seeing shadows) Depression: Yes Depression Symptoms: Isolating;Guilt;Feeling worthless/self pity;Feeling angry/irritable;Despondent Substance abuse history and/or treatment for substance abuse?: Yes (Pt reports that he has been clean  for 1 yr.  BAL is <11.) Suicide prevention information given to non-admitted patients: Yes  Risk to Others Homicidal Ideation: Yes-Currently Present Thoughts of Harm to Others: Yes-Currently Present Comment - Thoughts of Harm to Others: Pt reports having thoughts of huring other famiy members.  Current Homicidal Intent: No Current Homicidal Plan: No Access to Homicidal Means: No Identified Victim: Bro and Sis in law  (His in laws will not allow his to see his children unsupervi) History of harm to others?: Yes Assessment of Violence: In distant past Violent Behavior Description: None  Does patient have access to weapons?: No Criminal Charges Pending?: No Does patient have a court date: No  Psychosis Hallucinations: Auditory;Visual Delusions: None noted  Mental Status Report Appear/Hygiene: Disheveled Eye Contact: Fair Motor Activity: Freedom of movement Speech: Logical/coherent Level of Consciousness: Quiet/awake Mood: Despair Affect: Depressed Anxiety Level: None Thought Processes: Coherent;Relevant Judgement: Unimpaired Orientation: Person;Place;Time;Situation Obsessive Compulsive Thoughts/Behaviors: None  Cognitive Functioning Concentration: Decreased Memory: Recent Intact;Remote Intact IQ: Average Insight: Poor Impulse Control: Poor Appetite: Fair Weight Loss: 0 Weight Gain: 0 Sleep: Decreased Total Hours of Sleep: 4 Vegetative  Symptoms: None  ADLScreening [facility] Assessment Services) Patient's cognitive ability adequate to safely complete daily activities?: Yes Patient able to express need for assistance with ADLs?: Yes Independently performs ADLs?: Yes (appropriate for developmental age)  Abuse/Neglect [facility]) Physical Abuse: Denies Verbal Abuse: Denies Sexual Abuse: Denies  Prior Inpatient Therapy Prior Inpatient Therapy: Yes Prior Therapy Dates: 2013 Prior Therapy Facilty/Provider(s): [facility] Reason for Treatment: Depression  Prior Outpatient  Therapy Prior Outpatient Therapy: Yes Prior Therapy Dates: ongoing Prior Therapy Facilty/Provider(s): [facility] Reason for Treatment: Depression   ADL Screening (condition at time of admission) Patient's cognitive ability adequate to safely complete daily activities?: Yes Patient able to express need for assistance with ADLs?: Yes Independently performs ADLs?: Yes (appropriate for developmental age)       Abuse/Neglect Assessment (Assessment to be complete while patient is alone) Physical Abuse: Denies Verbal Abuse: Denies Sexual Abuse: Denies Values / Beliefs Cultural Requests During Hospitalization: None Spiritual Requests During Hospitalization: None        Additional Information 1:1 In Past 12 Months?: No CIRT Risk: No Elopement Risk: No Does patient have medical clearance?: Yes     Disposition: Pending [facility].  Disposition Initial Assessment Completed for this Encounter: Yes Disposition of Patient: Referred to Patient referred to: Other (Comment)  On Site Evaluation by:   Reviewed with Physician:     Martin Singh 03/17/2013 3:24 AM

## 2013-03-17 NOTE — Progress Notes (Signed)
Adult Psychoeducational Group Note  Date:  03/17/2013 Time:  10:42 PM  Group Topic/Focus:  Healthy Communication:   The focus of this group is to discuss communication, barriers to communication, as well as healthy ways to communicate with others.  Participation Level:  Active  Participation Quality:  Sharing  Affect:  Appropriate  Cognitive:  Alert and Appropriate  Insight: Appropriate and Good  Engagement in Group:  Supportive  Modes of Intervention:  Problem-solving  Additional Comments:  Yosgart was able to relate a current situation with the activity during group.  Annell Greening Watson 03/17/2013, 10:42 PM

## 2013-03-17 NOTE — ED Provider Notes (Signed)
Medical screening examination/treatment/procedure(s) were performed by non-physician practitioner and as supervising physician I was immediately available for consultation/collaboration.   Ashby Dawes, MD 03/17/13 1002

## 2013-03-17 NOTE — H&P (Signed)
Psychiatric Admission Assessment Adult  Patient Identification:  Martin Singh Date of Evaluation:  03/17/2013 Chief Complaint:  MDD History of Present Illness:  Patient's depression increased two months ago and he visited his primary care provider but no medication changes made.  Martin Singh states his depression increased from that time with multiple stressors:  Loss of custody of his son, loss of job and home, applying for disability.  His depression increased to the point of suicidal and homicidal ideations with lack of sleep and he came to the ED for help before he relapsed on alcohol or other substance.   Clean for one year.  His depression decreases with rest and increases when he thinks about his son.  Cooperative on assessment with logical thought processes, concentration fair to poor, calm demeanor.  Associated Signs/Synptoms: Depression Symptoms:  depressed mood, difficulty concentrating, recurrent thoughts of death, disturbed sleep, decreased appetite, (Hypo) Manic Symptoms:  None Anxiety Symptoms:  Excessive Worry, Psychotic Symptoms:  None PTSD Symptoms: NA  Psychiatric Specialty Exam: Physical Exam:  Completed in ED, reviewed, stable  Review of Systems  Constitutional: Negative.   HENT: Negative.   Eyes: Negative.   Respiratory: Negative.   Cardiovascular: Negative.   Gastrointestinal: Negative.   Genitourinary: Negative.   Musculoskeletal: Negative.   Skin: Negative.   Neurological: Negative.   Endo/Heme/Allergies: Negative.   Psychiatric/Behavioral: Positive for depression. The patient is nervous/anxious.     Blood pressure 160/103, pulse 81, temperature 98.7 F (37.1 C), temperature source Oral, resp. rate 18, height 5\' 1"  (1.549 m), weight 79.833 kg (176 lb).Body mass index is 33.27 kg/(m^2).  General Appearance: Casual  Eye Contact::  Fair  Speech:  Normal Rate  Volume:  Normal  Mood:  Anxious and Depressed  Affect:  Congruent  Thought Process:  Coherent   Orientation:  Full (Time, Place, and Person)  Thought Content:  WDL  Suicidal Thoughts:  Yes.  without intent/plan  Homicidal Thoughts:  No  Memory:  Immediate;   Fair Recent;   Fair Remote;   Fair  Judgement:  Impaired  Insight:  Lacking  Psychomotor Activity:  Decreased  Concentration:  Fair  Recall:  Fair  Akathisia:  No  Handed:  Right  AIMS (if indicated):     Assets:  Resilience  Sleep:       Past Psychiatric History: Diagnosis:  Depression and anxiety  Hospitalizations:  [place] years ago  Outpatient Care:  None  Substance Abuse Care:  Rehab for substance abuse  Self-Mutilation: None  Suicidal Attempts:  Overdose x 1  Violent Behaviors:  None   Past Medical History:   Past Medical History  Diagnosis Date  . Hypertension   . Depressive disorder, not elsewhere classified   . Osteoarthrosis, unspecified whether generalized or localized, unspecified site   . Irritable bowel syndrome   . Esophageal reflux   . Allergic rhinitis   . History of pneumonia   . Insomnia   . Anxiety   . Diverticulosis   . Alcohol abuse 2012  . Benzodiazepine abuse   . Seizures   . Alcoholic pancreatitis   . Erosive esophagitis   . Periumbilical hernia 12/13/12  . Abnormal LFTs   . Fatty liver 10/08/11  . Internal hemorrhoids 03/13/11  . Hiatal hernia 03/13/11  . Asthmatic bronchitis   . Astigmatism    Seizure history Allergies:   Allergies  Allergen Reactions  . Codeine Anaphylaxis  . Vicodin (Hydrocodone-Acetaminophen) Anaphylaxis  . Benazepril Cough  . Ativan (Lorazepam) Other (See  Comments)    Becomes violent and hallucinates  . Corticosteroids Other (See Comments)    Shaky and high blood pressure   PTA Medications: Prescriptions prior to admission  Medication Sig Dispense Refill  . albuterol (PROVENTIL HFA;VENTOLIN HFA) 108 (90 BASE) MCG/ACT inhaler Inhale 2 puffs into the lungs every 6 (six) hours as needed for wheezing or shortness of breath.       Marland Kitchen aspirin 81 MG  chewable tablet Chew 81 mg by mouth daily.      Marland Kitchen atenolol (TENORMIN) 50 MG tablet Take 50 mg by mouth daily.       . carbamazepine (TEGRETOL) 200 MG tablet Take 200 mg by mouth 2 (two) times daily.      . cloNIDine (CATAPRES) 0.1 MG tablet Take 0.1 mg by mouth 2 (two) times daily.       Marland Kitchen FLUoxetine (PROZAC) 20 MG capsule Take 40 mg by mouth daily.      Marland Kitchen gabapentin (NEURONTIN) 300 MG capsule Take 300 mg by mouth 2 (two) times daily.      Marland Kitchen gemfibrozil (LOPID) 600 MG tablet Take 600 mg by mouth 2 (two) times daily before a meal.      . hydrOXYzine (ATARAX/VISTARIL) 25 MG tablet Take 25 mg by mouth 2 (two) times daily as needed for anxiety.       . naproxen (NAPROSYN) 500 MG tablet Take 500 mg by mouth 2 (two) times daily as needed (pain).      . nortriptyline (PAMELOR) 10 MG capsule Take 1 capsule (10 mg total) by mouth at bedtime.  90 capsule  1  . omeprazole (PRILOSEC) 20 MG capsule Take 20 mg by mouth 2 (two) times daily.      . promethazine (PHENERGAN) 25 MG tablet Take 25 mg by mouth every 8 (eight) hours as needed for nausea.      . ranitidine (ZANTAC) 150 MG tablet Take 150 mg by mouth 2 (two) times daily.      . hydrochlorothiazide (HYDRODIURIL) 25 MG tablet Take 1 tablet (25 mg total) by mouth daily. For high blood pressure  30 tablet  0    Previous Psychotropic Medications:  None  Medication/Dose:  Prozac, nortriptyline, atarax     Substance Abuse History in the last 12 months:  no  Consequences of Substance Abuse: NA  Social History:  reports that he quit smoking about 21 years ago. His smoking use included Cigarettes. He has a 27 pack-year smoking history. His smokeless tobacco use includes Chew. He reports that  drinks alcohol. He reports that he does not use illicit drugs. 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 Legal History: Hobbies/Interests:  Family History:   Family History  Problem Relation Age of Onset  . Heart disease Mother   . Gallbladder disease Mother   . Nephrolithiasis Mother   . Arthritis Mother   . Hyperlipidemia Mother   . Stroke Mother   . Irritable bowel syndrome  Mother   . Cancer Mother     vulva  . Colon cancer Neg Hx   . Hypertension Father     moth  . Heart disease Father   . Arthritis Father   . Irritable bowel syndrome Brother     x 2    Results for orders placed during the hospital encounter of 03/16/13 (from the past 72 hour(s))  CBC     Status: Abnormal   Collection Time    03/16/13 10:17 AM      Result Value Range   WBC 3.2 (*) 4.0 - 10.5 K/uL   RBC 4.13 (*) 4.22 - 5.81 MIL/uL   Hemoglobin 12.9 (*) 13.0 - 17.0 g/dL   HCT 16.1 (*) 09.6 - 04.5 %   MCV 90.3  78.0 - 100.0 fL   MCH 31.2  26.0 - 34.0 pg   MCHC 34.6  30.0 - 36.0 g/dL   RDW 40.9  81.1 - 91.4 %   Platelets 265  150 - 400 K/uL  COMPREHENSIVE METABOLIC PANEL     Status: Abnormal   Collection Time    03/16/13 10:17 AM      Result Value Range   Sodium 137  135 - 145 mEq/L   Potassium 3.5  3.5 - 5.1 mEq/L   Chloride 98  96 - 112 mEq/L   CO2 27  19 - 32 mEq/L   Glucose, Bld 105 (*) 70 - 99 mg/dL   BUN 23  6 - 23 mg/dL   Creatinine, Ser 7.82  0.50 - 1.35 mg/dL   Calcium 9.8  8.4 - 95.6 mg/dL   Total Protein 7.8  6.0 - 8.3 g/dL   Albumin 4.5  3.5 - 5.2 g/dL   AST 23  0 - 37 U/L   ALT 17  0 -  53 U/L   Alkaline Phosphatase 51  39 - 117 U/L   Total Bilirubin 0.3  0.3 - 1.2 mg/dL   GFR calc non Af Amer 70 (*) >90 mL/min   GFR calc Af Amer 82 (*) >90 mL/min   Comment:            The eGFR has been calculated     using the CKD EPI equation.     This calculation has not been     validated in all clinical     situations.     eGFR's persistently     <90 mL/min signify     possible Chronic Kidney Disease.  ETHANOL     Status: None   Collection Time    03/16/13 10:17 AM      Result Value Range   Alcohol, Ethyl (B) <11  0 - 11 mg/dL   Comment:            LOWEST DETECTABLE LIMIT FOR     SERUM ALCOHOL IS 11 mg/dL     FOR MEDICAL PURPOSES ONLY  URINE RAPID DRUG SCREEN (HOSP PERFORMED)     Status: None   Collection Time    03/16/13  4:52 PM      Result Value Range   Opiates NONE DETECTED  NONE DETECTED   Cocaine NONE DETECTED  NONE DETECTED   Benzodiazepines NONE DETECTED  NONE DETECTED   Amphetamines NONE DETECTED  NONE DETECTED   Tetrahydrocannabinol NONE DETECTED  NONE DETECTED   Barbiturates NONE DETECTED  NONE DETECTED   Comment:            DRUG SCREEN FOR MEDICAL PURPOSES  ONLY.  IF CONFIRMATION IS NEEDED     FOR ANY PURPOSE, NOTIFY LAB     WITHIN 5 DAYS.                LOWEST DETECTABLE LIMITS     FOR URINE DRUG SCREEN     Drug Class       Cutoff (ng/mL)     Amphetamine      1000     Barbiturate      200     Benzodiazepine   200     Tricyclics       300     Opiates          300     Cocaine          300     THC              50   Psychological Evaluations:  Assessment:   AXIS I:  Major Depression, Recurrent severe AXIS II:  Deferred AXIS III:   Past Medical History  Diagnosis Date  . Hypertension   . Depressive disorder, not elsewhere classified   . Osteoarthrosis, unspecified whether generalized or localized, unspecified site   . Irritable bowel syndrome   . Esophageal reflux   . Allergic rhinitis   . History of pneumonia   . Insomnia   . Anxiety    . Diverticulosis   . Alcohol abuse 2012  . Benzodiazepine abuse   . Seizures   . Alcoholic pancreatitis   . Erosive esophagitis   . Periumbilical hernia 12/13/12  . Abnormal LFTs   . Fatty liver 10/08/11  . Internal hemorrhoids 03/13/11  . Hiatal hernia 03/13/11  . Asthmatic bronchitis   . Astigmatism    AXIS IV:  economic problems, occupational problems, other psychosocial or environmental problems, problems related to social environment and problems with primary support group AXIS V:  41-50 serious symptoms  Treatment Plan/Recommendations:  Plan:  Review of chart, vital signs, medications, and notes. 1-Admit for crisis management and stabilization.  Estimated length of stay 5-7 days past his current stay of 0 2-Individual and group therapy encouraged 3-Medication management for depression and anxiety to reduce current symptoms to base line and improve the patient's overall level of functioning:  Medications reviewed with the patient and he stated no untoward effects, home medications reinstated except phenergan PRN--replaced with Zofran PRN for nausea, nortriptyline 10 mg for sleep replaced with Trazodone 50 mg, repeat x 1. 4-Coping skills for depression and anxiety developing-- 5-Continue crisis stabilization and management 6-Address health issues--monitoring vital signs, elevated BP-- home blood pressure medications reinstated 7-Treatment plan in progress to prevent relapse of depression and anxiety 8-Psychosocial education regarding relapse prevention and self-care 8-Health care follow up as needed for any health concerns 9-Call for consult with hospitalist for additional specialty patient services as needed.  Treatment Plan Summary: Daily contact with patient to assess and evaluate symptoms and progress in treatment Medication management Supportive approach/identify stressors/work on coping skils/CBT/optimize treatment with psychotropics/relapse prevention Current Medications:   Current Facility-Administered Medications  Medication Dose Route Frequency Provider Last Rate Last Dose  . albuterol (PROVENTIL HFA;VENTOLIN HFA) 108 (90 BASE) MCG/ACT inhaler 2 puff  2 puff Inhalation Q4H PRN Nanine Means, NP      . alum & mag hydroxide-simeth (MAALOX/MYLANTA) 200-200-20 MG/5ML suspension 30 mL  30 mL Oral Q4H PRN Kerry Hough, PA-C      . aspirin EC tablet 81 mg  81 mg Oral Daily Nanine Means,  NP      . atenolol (TENORMIN) tablet 50 mg  50 mg Oral Daily Nanine Means, NP      . carbamazepine (TEGRETOL) tablet 200 mg  200 mg Oral BID Nanine Means, NP      . cloNIDine (CATAPRES) tablet 0.1 mg  0.1 mg Oral BID Nanine Means, NP      . FLUoxetine (PROZAC) capsule 20 mg  20 mg Oral BID Nanine Means, NP      . gabapentin (NEURONTIN) capsule 300 mg  300 mg Oral BID Nanine Means, NP      . gemfibrozil (LOPID) tablet 600 mg  600 mg Oral BID AC Nanine Means, NP      . hydrochlorothiazide (HYDRODIURIL) tablet 25 mg  25 mg Oral Daily Nanine Means, NP      . hydrOXYzine (ATARAX/VISTARIL) tablet 25 mg  25 mg Oral Q6H PRN Nanine Means, NP      . magnesium hydroxide (MILK OF MAGNESIA) suspension 30 mL  30 mL Oral Daily PRN Kerry Hough, PA-C      . naproxen (NAPROSYN) tablet 500 mg  500 mg Oral BID WC Nanine Means, NP      . ondansetron (ZOFRAN) tablet 4 mg  4 mg Oral Q8H PRN Nanine Means, NP      . pantoprazole (PROTONIX) EC tablet 40 mg  40 mg Oral BID AC Nanine Means, NP      . traZODone (DESYREL) tablet 50 mg  50 mg Oral QHS Nanine Means, NP        Observation Level/Precautions:  15 minute checks  Laboratory:  Completed in ED, reviewed, stable  Psychotherapy:  Individual and group therapy  Medications:  See PTA above  Consultations:  None  Discharge Concerns:  None    Estimated LOS:  5-7 days  Other:     I certify that inpatient services furnished can reasonably be expected to improve the patient's condition.   Nanine Means, PMH-NP 7/22/201411:14 AM Agree with assessment  and plan Madie Reno A.Dub Mikes, M.D.

## 2013-03-18 DIAGNOSIS — F411 Generalized anxiety disorder: Secondary | ICD-10-CM

## 2013-03-18 MED ORDER — FLUOXETINE HCL 20 MG PO CAPS
60.0000 mg | ORAL_CAPSULE | Freq: Every day | ORAL | Status: DC
  Administered 2013-03-19 – 2013-03-20 (×2): 60 mg via ORAL
  Filled 2013-03-18 (×2): qty 3
  Filled 2013-03-18: qty 42
  Filled 2013-03-18: qty 3

## 2013-03-18 MED ORDER — HYDROXYZINE HCL 50 MG PO TABS
50.0000 mg | ORAL_TABLET | Freq: Four times a day (QID) | ORAL | Status: DC | PRN
  Administered 2013-03-19: 50 mg via ORAL

## 2013-03-18 NOTE — Progress Notes (Signed)
[facility] MD Progress Note  03/18/2013 3:11 PM Martin Singh  MRN:  [id number]  Subjective:  Patient has complained of feeling depressed 6/10, anxiety 5/10 and headache. He has sleep difficulty and feeling tired. He has intact suicidal thoughts with plan of banging his head on wall or beating himself. Reportedly he slept good last night  Diagnosis:  Axis I: Generalized Anxiety Disorder and Major Depression, Recurrent severe  ADL's:  Intact  Sleep: Fair  Appetite:  Fair  Suicidal Ideation:  Plan:  yes Intent:  yes Means:  yes Homicidal Ideation:  denied AEB (as evidenced by):  Psychiatric Specialty Exam: ROS  Blood pressure 143/98, pulse 65, temperature 97.5 F (36.4 C), temperature source Oral, resp. rate 20, height 5\' 1"  (1.549 m), weight 79.833 kg (176 lb).Body mass index is 33.27 kg/(m^2).  General Appearance: Casual and Fairly Groomed  Patent attorney::  Fair  Speech:  Clear and Coherent  Volume:  Normal  Mood:  Anxious, Depressed, Hopeless, Irritable and Worthless  Affect:  Congruent and Constricted  Thought Process:  Coherent and Goal Directed  Orientation:  Full (Time, Place, and Person)  Thought Content:  Rumination  Suicidal Thoughts:  Yes.  with intent/plan  Homicidal Thoughts:  No  Memory:  Immediate;   Fair  Judgement:  Intact  Insight:  Lacking  Psychomotor Activity:  Psychomotor Retardation  Concentration:  Fair  Recall:  Fair  Akathisia:  Negative  Handed:  Right  AIMS (if indicated):     Assets:  Communication Skills Desire for Improvement Physical Health Social Support Transportation  Sleep:  Number of Hours: 6.5   Current Medications: Current Facility-Administered Medications  Medication Dose Route Frequency Provider Last Rate Last Dose  . albuterol (PROVENTIL HFA;VENTOLIN HFA) 108 (90 BASE) MCG/ACT inhaler 2 puff  2 puff Inhalation Q4H PRN Nanine Means, NP      . alum & mag hydroxide-simeth (MAALOX/MYLANTA) 200-200-20 MG/5ML suspension 30 mL  30 mL  Oral Q4H PRN Kerry Hough, PA-C      . aspirin EC tablet 81 mg  81 mg Oral Daily Nanine Means, NP   81 mg at 03/18/13 0815  . atenolol (TENORMIN) tablet 50 mg  50 mg Oral Daily Nanine Means, NP   50 mg at 03/18/13 0814  . carbamazepine (TEGRETOL) tablet 200 mg  200 mg Oral BID Nanine Means, NP   200 mg at 03/18/13 0813  . cloNIDine (CATAPRES) tablet 0.1 mg  0.1 mg Oral BID Nanine Means, NP   0.1 mg at 03/18/13 0813  . [START ON 03/19/2013] FLUoxetine (PROZAC) capsule 60 mg  60 mg Oral Daily Nehemiah Settle, MD      . gabapentin (NEURONTIN) capsule 300 mg  300 mg Oral BID Nanine Means, NP   300 mg at 03/18/13 0814  . gemfibrozil (LOPID) tablet 600 mg  600 mg Oral BID AC Nanine Means, NP   600 mg at 03/18/13 0655  . hydrochlorothiazide (HYDRODIURIL) tablet 25 mg  25 mg Oral Daily Nanine Means, NP   25 mg at 03/18/13 0815  . hydrOXYzine (ATARAX/VISTARIL) tablet 50 mg  50 mg Oral Q6H PRN Nehemiah Settle, MD      . magnesium hydroxide (MILK OF MAGNESIA) suspension 30 mL  30 mL Oral Daily PRN Kerry Hough, PA-C      . naproxen (NAPROSYN) tablet 500 mg  500 mg Oral BID WC Nanine Means, NP   500 mg at 03/18/13 0815  . ondansetron (ZOFRAN) tablet 4 mg  4  mg Oral Q8H PRN Nanine Means, NP      . pantoprazole (PROTONIX) EC tablet 40 mg  40 mg Oral BID AC Nanine Means, NP   40 mg at 03/18/13 0655  . traZODone (DESYREL) tablet 50 mg  50 mg Oral QHS Nanine Means, NP   50 mg at 03/17/13 2138    Lab Results:  Results for orders placed during the hospital encounter of 03/16/13 (from the past 48 hour(s))  URINE RAPID DRUG SCREEN (HOSP PERFORMED)     Status: None   Collection Time    03/16/13  4:52 PM      Result Value Range   Opiates NONE DETECTED  NONE DETECTED   Cocaine NONE DETECTED  NONE DETECTED   Benzodiazepines NONE DETECTED  NONE DETECTED   Amphetamines NONE DETECTED  NONE DETECTED   Tetrahydrocannabinol NONE DETECTED  NONE DETECTED   Barbiturates NONE DETECTED  NONE  DETECTED   Comment:            DRUG SCREEN FOR MEDICAL PURPOSES     ONLY.  IF CONFIRMATION IS NEEDED     FOR ANY PURPOSE, NOTIFY LAB     WITHIN 5 DAYS.                LOWEST DETECTABLE LIMITS     FOR URINE DRUG SCREEN     Drug Class       Cutoff (ng/mL)     Amphetamine      1000     Barbiturate      200     Benzodiazepine   200     Tricyclics       300     Opiates          300     Cocaine          300     THC              50    Physical Findings: AIMS: Facial and Oral Movements Muscles of Facial Expression: None, normal Lips and Perioral Area: None, normal Jaw: None, normal Tongue: None, normal,Extremity Movements Upper (arms, wrists, hands, fingers): None, normal Lower (legs, knees, ankles, toes): None, normal, Trunk Movements Neck, shoulders, hips: None, normal, Overall Severity Severity of abnormal movements (highest score from questions above): None, normal Incapacitation due to abnormal movements: None, normal Patient's awareness of abnormal movements (rate only patient's report): No Awareness, Dental Status Current problems with teeth and/or dentures?: No Does patient usually wear dentures?: No (no dentures/teeth on top)  CIWA:  CIWA-Ar Total: 4 COWS:  COWS Total Score: 5  Treatment Plan Summary: Daily contact with patient to assess and evaluate symptoms and progress in treatment Medication management  Plan: Increase Fluoxetine 60 mg Qam Increase Vistaril 50 mg Q6H PRN for anxiety Medication management to reduce current symptoms to base line and improve the patient's overall level of functioning. Treat health problems as indicated. Develop treatment plan to decrease risk of relapse upon discharge and to reduce the need for readmission. Psycho-social education regarding relapse prevention and self care. Health care follow up as needed for medical problems. Restart home medications where appropriate.   Medical Decision Making Problem Points:  Established  problem, worsening (2) and Review of psycho-social stressors (1) Data Points:  Review or order clinical lab tests (1) Review of medication regiment & side effects (2) Review of new medications or change in dosage (2)  I certify that inpatient services furnished can reasonably be expected to  improve the patient's condition.   Auna Mikkelsen,JANARDHAHA R. 03/18/2013, 3:11 PM

## 2013-03-18 NOTE — Progress Notes (Signed)
Adult Psychoeducational Group Note  Date:  03/18/2013 Time:  10:42 PM  Group Topic/Focus:  Wrap-Up Group:   The focus of this group is to help patients review their daily goal of treatment and discuss progress on daily workbooks.  Participation Level:  Active  Participation Quality:  Appropriate and Supportive  Affect:  Appropriate  Cognitive:  Appropriate  Insight: Appropriate  Engagement in Group:  Engaged and Supportive  Modes of Intervention:  Discussion, Socialization and Support  Additional Comments:  Pt stated he had a real real good day. Pt stated he slept good, all night. Pt stated he attended all groups instead of staying in his room and sleeping. Pt stated that he worked on mediation today and that it helps with his anxiety.  Caswell Corwin 03/18/2013, 10:42 PM

## 2013-03-18 NOTE — Progress Notes (Signed)
Patient has been up and active on the unit, attended group this evening and has voiced no complaints. Patient currently denies having pain, -si/hi/a/v hall.  Patient questioned what medications we have him on here because he feels that all his medications were not started. Writer went over medications with him and he was encouraged to talk with the doctor about the ones not started. Patients blood pressure is elevated and fluids encouraged, Clinical research associate provided patient with a pitcher of water. Support and encouragement offered, safety maintained on unit, will continue to monitor.

## 2013-03-18 NOTE — Tx Team (Signed)
Interdisciplinary Treatment Plan Update   Date Reviewed:  03/18/2013  Time Reviewed:  10:09 AM  Progress in Treatment:   Attending groups: Yes Participating in groups: Yes Taking medication as prescribed: Yes  Tolerating medication: Yes Family/Significant other contact made: No, but will ask patient for consent for collateral contact  Patient understands diagnosis: Yes  Discussing patient identified problems/goals with staff: Yes Medical problems stabilized or resolved: Yes Denies suicidal/homicidal ideation: No, but contracts for safety Patient has not harmed self or others: Yes  For review of initial/current patient goals, please see plan of care.  Estimated Length of Stay:  3-5 days  Reasons for Continued Hospitalization:  Anxiety Depression Medication stabilization Suicidal ideation  New Problems/Goals identified:    Discharge Plan or Barriers:   Home with outpatient follow up   Additional Comments: N/A  Attendees:  Patient:  03/18/2013 10:09 AM   Signature: Mervyn Gay, MD 03/18/2013 10:09 AM  Signature:  Verne Spurr, PA 03/18/2013 10:09 AM  Signature: Harold Barban, RN 03/18/2013 10:09 AM  Signature:Britney Chales Abrahams, RN 03/18/2013 10:09 AM  Signature:   03/18/2013 10:09 AM  Signature:  Juline Patch, LCSW 03/18/2013 10:09 AM  Signature:  Reyes Ivan, LCSW 03/18/2013 10:09 AM  Signature:  Maseta Dorley,Care Coordinator 03/18/2013 10:09 AM  Signature: 03/18/2013 10:09 AM  Signature:    Signature:    Signature:      Scribe for Treatment Team:   Juline Patch,  03/18/2013 10:09 AM

## 2013-03-18 NOTE — [facility] Group Notes (Signed)
[facility] LCSW Group Therapy  03/18/2013 3:16 PM  Type of Therapy:  Group Therapy  Participation Level:  Minimal  Participation Quality:  Appropriate  Affect:  Appropriate  Cognitive:  Alert, Appropriate  Insight:  Developing/Improving and Engaged  Engagement in Therapy:  Developing/Improving and Engaged  Modes of Intervention:  Discussion, Education, Exploration, Problem-solving, Rapport Building and Support  Summary of Progress/Problems:  Patient listened attentively but did not engage in discussion.  Wynn Banker 03/18/2013, 3:16 PM

## 2013-03-18 NOTE — BHH Group Notes (Signed)
[facility] LCSW Aftercare Discharge Planning Group Note   03/18/2013 1:17 PM  Participation Quality:  Appropriate  Mood/Affect:  Appropriate, Depressed and Flat  Depression Rating:  5  Anxiety Rating:  5  Thoughts of Suicide:  Yes  Will you contract for safety?   Yes  Current AVH:  No  Plan for Discharge/Comments:  Patient attending discharge planning group and actively participated in group.  He continues to endorse fleeting SI.  Patient will return to his home at discharge and will continue being seen by [place].CSW provided all participants with daily workbook.  Patient also advised of services offered by [facility].   Transportation Means: Patient has transportation.   Supports:  Patient has limited support system.   Presleigh Feldstein, Joesph July

## 2013-03-18 NOTE — Progress Notes (Signed)
Recreation Therapy Notes   Date: 07.23.2014  Time: 3:00pm Location: [address] Dayroom  Group Topic/Focus: Problem Solving, Communication  Participation Level:  Did not attend.   Marykay Lex Cannen Dupras, LRT/CTRS  Jearl Klinefelter 03/18/2013 4:27 PM

## 2013-03-18 NOTE — Progress Notes (Signed)
D: Patient appropriate and cooperative with staff and peers. Patient's affect/mood is anxious. He reported on the self inventory sheet that he slept well, appetite/ability to pay attention is improving and energy level is high. Patient rated depression "6" and feelings of hopelessness "5". He's attending groups/meals and compliant with medication regimen.  A: Support and encouragement provided to patient. Administered scheduled medications per ordering MD. Monitor Q15 minute checks for safety.  R: Patient receptive. Passive SI, but contracts for safety. Denies HI and AVH. Patient remains safe on the unit.

## 2013-03-18 NOTE — Progress Notes (Signed)
Recreation Therapy Notes   Date: 07.23.2014  Time: 3:00pm Location: [address] Dayroom  Group Topic/Focus: Problem Solving, Communication  Participation Level:  Active  Participation Quality:  Appropriate, Attentive   Affect:  Euthymic   Cognitive:  Appropriate  Additional Comments: Activity: Life Boat ; Explanation: Patients were given the following scenario: We have chartered a Radio producer for the afternoon with the below listed guests, half way through out trip the [employer] a leak and we start to sink. We can put everyone in the group on a life raft and sail back to shore. Including the people in this room we can eight of out guests. The list of guest is as follows: Materials engineer, [address], Janyth Pupa, Zorita Pang, [place], Runner, broadcasting/film/video, Chef, Curator, Personnel officer, Ex-Convict, Ex-Marine, Pregnant Woman, Physician, Nurse.   Patient missed the majority of recreation therapy group session due to being asked to leave by physician. Upon returning to group session patient listened to group members debate. Additionally patient listened to wrap up discussion, but did not contribute to discussion.   Marykay Lex Jazmen Lindenbaum, LRT/CTRS  Jearl Klinefelter 03/18/2013 4:44 PM

## 2013-03-19 NOTE — Progress Notes (Signed)
Patient ID: RAEQWON LUX, male   DOB: 12-Apr-1966, 47 y.o.   MRN: [id number] D: Patient denies SI/HI/AVH. Pt mood/affect seems anxious. Pt attended evening karaoke group and was engaged and  supportive of peers. Pt stated he slept well and had a good day.  Pt also  continue to meditate to help deal with his anxiety. Pt denies any needs or concerns.  Cooperative with assessment. No acute distressed noted at this time.   A: Met with pt 1:1. Medications administered as prescribed. Writer encouraged pt to discuss feelings. Pt encouraged to come to staff with any question or concerns. 15 minutes checks for safety.  R: Patient remains safe. He is complaint with medications and denies any adverse reaction. Continue current POC.

## 2013-03-19 NOTE — Progress Notes (Signed)
Adult Psychoeducational Group Note  Date:  03/19/2013 Time:  09:00 AM  Group Topic/Focus:  Dimensions of Wellness:   The focus of this group is to introduce the topic of wellness and discuss the role each dimension of wellness plays in total health.  Participation Level:  Active  Participation Quality:  Appropriate and Attentive  Affect:  Appropriate and Excited  Cognitive:  Alert and Oriented  Insight: Appropriate  Engagement in Group:  Engaged  Modes of Intervention:  Discussion  Additional Comments: Patient participated in the morning stretching exercise.  Harold Barban E 03/19/2013, 10:23 AM

## 2013-03-19 NOTE — BHH Group Notes (Signed)
[facility] LCSW Group Therapy  [facility] 1:15 - 2:30 PM  03/19/2013   Type of Therapy:  Group Therapy  Participation Level:  Active  Participation Quality:  Attentive  Affect:  Appropriate  Cognitive:  Appropriate  Insight:  Developing/Improving and Engaged  Engagement in Therapy:  Developing/Improving Engaged  Modes of Intervention:  Discussion, Education, Exploration, Problem-Solving, Rapport Building, Support   Summary of Progress/Problems:  Patient listened attentively to speaker from [facility].  Patient shared he plans to attend programming at [facility] and hopes his fiance will attend support groups.  Wynn Banker 03/19/2013

## 2013-03-19 NOTE — Progress Notes (Signed)
Patient ID: Martin Singh, male   DOB: 1966-03-24, 47 y.o.   MRN: [id number] [facility] Singh Progress Note  03/19/2013 2:11 PM Martin Singh  MRN:  [id number]  Subjective:  Patient has complained of feeling depressed 4/10, anxiety 2/10 and has no headache. He has sleep difficulty and feeling tired but says his medication adjustment helped last night. He has intact suicidal thoughts with plan of banging his head on wall or beating himself. Patient can not contract for safety and willing to work with staff regarding disposition plans  Diagnosis:  Axis I: Generalized Anxiety Disorder and Major Depression, Recurrent severe  ADL's:  Intact  Sleep: Fair  Appetite:  Fair  Suicidal Ideation:  Plan:  yes Intent:  yes Singh:  yes Homicidal Ideation:  denied AEB (as evidenced by):  Psychiatric Specialty Exam: ROS  Blood pressure 130/82, pulse 73, temperature 96.9 F (36.1 C), temperature source Oral, resp. rate 18, height 5\' 1"  (1.549 m), weight 79.833 kg (176 lb).Body mass index is 33.27 kg/(m^2).  General Appearance: Casual and Fairly Groomed  Patent attorney::  Fair  Speech:  Clear and Coherent  Volume:  Normal  Mood:  Anxious, Depressed, Hopeless, Irritable and Worthless  Affect:  Congruent and Constricted  Thought Process:  Coherent and Goal Directed  Orientation:  Full (Time, Place, and Person)  Thought Content:  Rumination  Suicidal Thoughts:  Yes.  with intent/plan  Homicidal Thoughts:  No  Memory:  Immediate;   Fair  Judgement:  Intact  Insight:  Lacking  Psychomotor Activity:  Psychomotor Retardation  Concentration:  Fair  Recall:  Fair  Akathisia:  Negative  Handed:  Right  AIMS (if indicated):     Assets:  Communication Skills Desire for Improvement Physical Health Social Support Transportation  Sleep:  Number of Hours: 6.75   Current Medications: Current Facility-Administered Medications  Medication Dose Route Frequency Provider Last Rate Last Dose  . albuterol (PROVENTIL  HFA;VENTOLIN HFA) 108 (90 BASE) MCG/ACT inhaler 2 puff  2 puff Inhalation Q4H PRN Martin Singh      . alum & mag hydroxide-simeth (MAALOX/MYLANTA) 200-200-20 MG/5ML suspension 30 mL  30 mL Oral Q4H PRN Martin Singh      . aspirin EC tablet 81 mg  81 mg Oral Daily Martin Singh   81 mg at 03/19/13 0811  . atenolol (TENORMIN) tablet 50 mg  50 mg Oral Daily Martin Singh   50 mg at 03/19/13 7829  . carbamazepine (TEGRETOL) tablet 200 mg  200 mg Oral BID Martin Singh   200 mg at 03/19/13 0811  . cloNIDine (CATAPRES) tablet 0.1 mg  0.1 mg Oral BID Martin Singh   0.1 mg at 03/19/13 0813  . FLUoxetine (PROZAC) capsule 60 mg  60 mg Oral Daily Martin Singh   60 mg at 03/19/13 5621  . gabapentin (NEURONTIN) capsule 300 mg  300 mg Oral BID Martin Singh   300 mg at 03/19/13 0811  . gemfibrozil (LOPID) tablet 600 mg  600 mg Oral BID AC Martin Singh   600 mg at 03/19/13 3086  . hydrochlorothiazide (HYDRODIURIL) tablet 25 mg  25 mg Oral Daily Martin Singh   25 mg at 03/19/13 5784  . hydrOXYzine (ATARAX/VISTARIL) tablet 50 mg  50 mg Oral Q6H PRN Martin Singh      . magnesium hydroxide (MILK OF MAGNESIA) suspension 30 mL  30 mL Oral Daily PRN Martin Singh  30 mL at 03/18/13 1830  . naproxen (NAPROSYN) tablet 500 mg  500 mg Oral BID WC Martin Singh   500 mg at 03/19/13 0454  . ondansetron (ZOFRAN) tablet 4 mg  4 mg Oral Q8H PRN Martin Singh      . pantoprazole (PROTONIX) EC tablet 40 mg  40 mg Oral BID AC Martin Singh   40 mg at 03/19/13 0629  . traZODone (DESYREL) tablet 50 mg  50 mg Oral QHS Martin Singh   50 mg at 03/18/13 2154    Lab Results:  No results found for this or any previous visit (from the past 48 hour(s)).  Physical Findings: AIMS: Facial and Oral Movements Muscles of Facial Expression: None, normal Lips and Perioral Area: None, normal Jaw: None, normal Tongue: None, normal,Extremity  Movements Upper (arms, wrists, hands, fingers): None, normal Lower (legs, knees, ankles, toes): None, normal, Trunk Movements Neck, shoulders, hips: None, normal, Overall Severity Severity of abnormal movements (highest score from questions above): None, normal Incapacitation due to abnormal movements: None, normal Patient's awareness of abnormal movements (rate only patient's report): No Awareness, Dental Status Current problems with teeth and/or dentures?: No Does patient usually wear dentures?: No (no dentures/teeth on top)  CIWA:  CIWA-Ar Total: 4 COWS:  COWS Total Score: 5  Treatment Plan Summary: Daily contact with patient to assess and evaluate symptoms and progress in treatment Medication management  Plan: Continue Fluoxetine 60 mg Qam Continue Vistaril 50 mg Q6H PRN for anxiety Medication management to reduce current symptoms to base line and improve the patient's overall level of functioning. Treat health problems as indicated. Develop treatment plan to decrease risk of relapse upon discharge and to reduce the need for readmission. Psycho-social education regarding relapse prevention and self care. Health care follow up as needed for medical problems. Restart home medications where appropriate.   Medical Decision Making Problem Points:  Established problem, worsening (2) and Review of psycho-social stressors (1) Data Points:  Review or order clinical lab tests (1) Review of medication regiment & side effects (2) Review of new medications or change in dosage (2)  I certify that inpatient services furnished can reasonably be expected to improve the patient's condition.   Martin Singh,Martin R. 03/19/2013, 2:11 PM

## 2013-03-19 NOTE — Progress Notes (Signed)
Patient ID: Martin Singh, male   DOB: 11-Jan-1966, 47 y.o.   MRN: [id number] D: Patient denies SI/HI/AVH. Pt seems to have a depressed mood and flat affect.  Pt rates depression as 3 and anxiety as 3 on a 0-10 scale.  Pt attended evening wrap up group and was engaged and  supportive of peers. Pt stated in group he slept well and had a good day.  Pt stated he attended all groups instead of staying in his room and sleeping. Pt also  stated he has been meditating to help deal with his anxiety. Pt denies any needs or concerns.  Cooperative with assessment. No acute distressed noted at this time.   A: Met with pt 1:1. Medications administered as prescribed. Writer encouraged pt to discuss feelings. Pt encouraged to come to staff with any question or concerns. 15 minutes checks for safety.  R: Patient remains safe. He is complaint with medications and denies any adverse reaction. Continue current POC.

## 2013-03-19 NOTE — Progress Notes (Signed)
D: Patient pleasant and cooperative with staff and peers. Patient's affect is appropriate to circumstance, but mood is anxious. He reported on the self inventory sheet that he slept well, appetite/ability to pay attention is good and energy level is normal. Patient rated depression "2" and feelings of hopelessness "1". He's attending and participating in groups.  A: Support and encouragement provided to patient. Scheduled medications administered per MD orders. Maintain Q15 minute checks for safety.  R: Patient receptive. Denies SI/HI/AVH. Patient remains safe.

## 2013-03-20 DIAGNOSIS — F1010 Alcohol abuse, uncomplicated: Secondary | ICD-10-CM

## 2013-03-20 MED ORDER — OMEPRAZOLE 20 MG PO CPDR: 20.0000 mg | DELAYED_RELEASE_CAPSULE | Freq: Two times a day (BID) | ORAL | Status: DC

## 2013-03-20 MED ORDER — RANITIDINE HCL 150 MG PO TABS: 150.0000 mg | ORAL_TABLET | Freq: Two times a day (BID) | ORAL | Status: DC

## 2013-03-20 MED ORDER — GEMFIBROZIL 600 MG PO TABS: 600.0000 mg | ORAL_TABLET | Freq: Two times a day (BID) | ORAL | Status: DC

## 2013-03-20 MED ORDER — TRAZODONE HCL 50 MG PO TABS: 50.0000 mg | ORAL_TABLET | Freq: Every day | ORAL | Status: DC

## 2013-03-20 MED ORDER — CLONIDINE HCL 0.1 MG PO TABS: 0.1000 mg | ORAL_TABLET | Freq: Two times a day (BID) | ORAL | Status: DC

## 2013-03-20 MED ORDER — HYDROCHLOROTHIAZIDE 25 MG PO TABS: 25.0000 mg | ORAL_TABLET | Freq: Every day | ORAL | Status: DC

## 2013-03-20 MED ORDER — ATENOLOL 50 MG PO TABS: 50.0000 mg | ORAL_TABLET | Freq: Every day | ORAL | Status: DC

## 2013-03-20 MED ORDER — FLUOXETINE HCL 20 MG PO CAPS: 60.0000 mg | ORAL_CAPSULE | Freq: Every day | ORAL | Status: DC

## 2013-03-20 MED ORDER — GABAPENTIN 300 MG PO CAPS: 300.0000 mg | ORAL_CAPSULE | Freq: Two times a day (BID) | ORAL | Status: DC

## 2013-03-20 MED ORDER — PANTOPRAZOLE SODIUM 40 MG PO TBEC: 40.0000 mg | DELAYED_RELEASE_TABLET | Freq: Two times a day (BID) | ORAL | Status: DC

## 2013-03-20 MED ORDER — CARBAMAZEPINE 200 MG PO TABS: 200.0000 mg | ORAL_TABLET | Freq: Two times a day (BID) | ORAL | Status: DC

## 2013-03-20 NOTE — Progress Notes (Signed)
[facility] Group Notes:  (Nursing/MHT/Case Management/Adjunct)  Date:  03/20/2013  Time:  6:54 PM  Type of Therapy:  Therapeutic Activity  Participation Level:  Active  Participation Quality:  Appropriate and Attentive  Affect:  Appropriate  Cognitive:  Appropriate  Insight:  Appropriate  Engagement in Group:  Developing/Improving  Modes of Intervention:  Activity, Discussion, Exploration and Socialization  Summary of Progress/Problems: Harman attended and participated in coping skills pictionary, where one person in the group comes up and chooses a coping skill and draws a picture on the board and patient guesses what the picture is. Patients learned how communication was a big part of the game and how you interpret and share information with others to get your point across.    Karleen Hampshire Brittini 03/20/2013, 6:54 PM

## 2013-03-20 NOTE — Tx Team (Signed)
Interdisciplinary Treatment Plan Update   Date Reviewed:  03/20/2013  Time Reviewed:  12:30 PM  Progress in Treatment:   Attending groups: Yes Participating in groups: Yes Taking medication as prescribed: Yes  Tolerating medication: Yes Family/Significant other contact made: No,attempts made but unable to reach with collateral.  Will attempt to make contact with collateral upon arrival to pick patient up for discharge. Patient understands diagnosis: Yes  Discussing patient identified problems/goals with staff: Yes Medical problems stabilized or resolved: Yes Denies suicidal/homicidal ideation: No, but contracts for safety Patient has not harmed self or others: Yes  For review of initial/current patient goals, please see plan of care.  Estimated Length of Stay:  Discharge today  Reasons for Continued Hospitalization:    New Problems/Goals identified:    Discharge Plan or Barriers:   Home with outpatient follow up with [facility].   Additional Comments: N/A  Attendees:  Patient:  03/20/2013 12:30 PM   Signature: Mervyn Gay, MD 03/20/2013 12:30 PM  Signature:  Verne Spurr, PA 03/20/2013 12:30 PM  Signature: Harold Barban, RN 03/20/2013 12:30 PM  Signature: Alease Frame, RN 03/20/2013 12:30 PM  Signature:   03/20/2013 12:30 PM  Signature:  Juline Patch, LCSW 03/20/2013 12:30 PM  Signature:  Reyes Ivan, LCSW 03/20/2013 12:30 PM  Signature:   03/20/2013 12:30 PM  Signature: 03/20/2013 12:30 PM  Signature:    Signature:    Signature:      Scribe for Treatment Team:   Juline Patch,  03/20/2013 12:30 PM

## 2013-03-20 NOTE — BHH Group Notes (Signed)
[facility] LCSW Aftercare Discharge Planning Group Note   03/20/2013 12:28 PM  Participation Quality: Appropriate  Mood/Affect:  Appropriate  Depression Rating:  0  Anxiety Rating:  0  Thoughts of Suicide:  No  Will you contract for safety?   NA  Current AVH:  No  Plan for Discharge/Comments:  Patient attending discharge planning group and actively participated in group.  He reports being much better and hopes to discharge soon.  Patient is followed by [facility].CSW provided all participants with daily workbook and information on services offered by [facility] [place].   Transportation Means: Patient has transportation.   Supports:  Patient has a good support system.   Martin Singh, Martin Singh

## 2013-03-20 NOTE — BHH Suicide Risk Assessment (Signed)
B[facility] INPATIENT:  Family/Significant Other Suicide Prevention Education  Suicide Prevention Education:  Education Completed; Loni Dolly, [phone]; has been identified by the patient as the family member/significant other with whom the patient will be residing, and identified as the person(s) who will aid the patient in the event of a mental health crisis (suicidal ideations/suicide attempt).  With written consent from the patient, the family member/significant other has been provided the following suicide prevention education, prior to the and/or following the discharge of the patient.  The suicide prevention education provided includes the following:  Suicide risk factors  Suicide prevention and interventions  National Suicide Hotline telephone number  [facility] assessment telephone number  [facility] Emergency Assistance 911  [facility] and/or Residential Mobile Crisis Unit telephone number  Request made of family/significant other to:  Remove weapons (e.g., guns, rifles, knives), all items previously/currently identified as safety concern.  Fiance reports patient does not have access to guns.  Remove drugs/medications (over-the-counter, prescriptions, illicit drugs), all items previously/currently identified as a safety concern.  The family member/significant other verbalizes understanding of the suicide prevention education information provided.  The family member/significant other agrees to remove the items of safety concern listed above.  Wynn Banker 03/20/2013, 2:29 PM

## 2013-03-20 NOTE — Progress Notes (Signed)
Patient ID: Martin Singh, male   DOB: 02/23/1966, 47 y.o.   MRN: [id number] D. The patient reported difficulty sleeping. Stated he was over stimulated after attending Karaoke. Reports that the karaoke lifted his mood and he really enjoyed attending the group. A. Administered medication for anxiety/insomnia. R. Resting in bed with eyes closed.

## 2013-03-20 NOTE — Progress Notes (Signed)
Adult Psychoeducational Group Note  Date:  03/20/2013 Time:  1:24 PM  Group Topic/Focus:  Relapse Prevention Planning:   The focus of this group is to define relapse and discuss the need for planning to combat relapse.  Participation Level:  Active  Participation Quality:  Appropriate, Attentive and Sharing  Affect:  Appropriate  Cognitive:  Alert and Appropriate  Insight: Good  Engagement in Group:  Engaged  Modes of Intervention:  Activity, Discussion, Socialization and Support  Additional Comments:  Pt came to group and was sharing of his personal triggers, early warning signs, and high risk situations. Pt found many of the coping skills discussed useful for his life.  Cathlean Cower 03/20/2013, 1:24 PM

## 2013-03-20 NOTE — Progress Notes (Signed)
Discharge Note: Discharge instructions/prescriptions/medication samples given to patient. Patient verbalized understanding of discharge instructions and prescriptions. Returned belongings to patient. Denies SI/HI/AVH. Patient d/c without incident to the lobby and transported home by fiancee'.

## 2013-03-20 NOTE — Progress Notes (Signed)
[facility] Adult Case Management Discharge Plan :  Will you be returning to the same living situation after discharge: Yes,  Patient returning to his home. At discharge, do you have transportation home?:Yes,  Patient to arrange transportation. Do you have the ability to pay for your medications:No.   No, patient will be assisted with indigent medications.   Release of information consent forms completed and in the chart;  Patient's signature needed at discharge.  Patient to Follow up at: Follow-up Information   Follow up with [facility] On 03/23/2013. (You are scheduled with Ralene Cork on Monday, March 23, 2013 at [facility])    Contact information:   [address] [place], [place]   [address]  [phone]       Patient denies SI/HI:   Patient no longer endorsing SI/HI or other thoughts of self harm.    . Safety Planning and Suicide Prevention discussed:  .Reviewed with all patients during discharge planning group   Antawn Sison, Joesph July 03/20/2013, 12:32 PM

## 2013-03-20 NOTE — BHH Suicide Risk Assessment (Signed)
BHH INPATIENT:  Family/Significant Other Suicide Prevention Education  Suicide Prevention Education:  Contact Attempts; Loni Dolly, [place], [phone] has been identified by the patient as the family member/significant other with whom the patient will be residing, and identified as the person(s) who will aid the patient in the event of a mental health crisis.  With written consent from the patient, two attempts were made to provide suicide prevention education, prior to and/or following the patient's discharge.  We were unsuccessful in providing suicide prevention education.  A suicide education pamphlet was given to the patient to share with family/significant other.  Date and time of first attempt:  03/19/13 Date and time of second attempt: 03/20/13  Writer will attempt to meet with fiance upon arrival to pick patient up for discharge. Wynn Banker 03/20/2013, 12:34 PM

## 2013-03-20 NOTE — BHH Suicide Risk Assessment (Signed)
Suicide Risk Assessment  Discharge Assessment     Demographic Factors:  Male, Adolescent or young adult, Caucasian, Low socioeconomic status and Unemployed  Mental Status Per Nursing Assessment::   On Admission:  Suicidal ideation indicated by patient;Belief that plan would result in death;Thoughts of violence towards others  Current Mental Status by Physician: Mental Status Examination: Patient appeared as per his stated age, casually dressed, and fairly groomed, and maintaining good eye contact. Patient has good mood and his affect was constricted. He has normal rate, rhythm, and volume of speech. His thought process is linear and goal directed. Patient has denied suicidal, homicidal ideations, intentions or plans. Patient has no evidence of auditory or visual hallucinations, delusions, and paranoia. Patient has fair insight judgment and impulse control.  Loss Factors: Financial problems/change in socioeconomic status  Historical Factors: Prior suicide attempts, Family history of suicide, Family history of mental illness or substance abuse and Impulsivity  Risk Reduction Factors:   Sense of responsibility to family, Religious beliefs about death, Living with another person, especially a relative, Positive social support, Positive therapeutic relationship and Positive coping skills or problem solving skills  Continued Clinical Symptoms:  Severe Anxiety and/or Agitation Depression:   Comorbid alcohol abuse/dependence Recent sense of peace/wellbeing Severe  Cognitive Features That Contribute To Risk:  Polarized thinking    Suicide Risk:  Minimal: No identifiable suicidal ideation.  Patients presenting with no risk factors but with morbid ruminations; may be classified as minimal risk based on the severity of the depressive symptoms  Discharge Diagnoses:   AXIS I:  Alcohol Abuse, Generalized Anxiety Disorder and Major Depression, Recurrent severe AXIS II:  Deferred AXIS III:    Past Medical History  Diagnosis Date  . Hypertension   . Depressive disorder, not elsewhere classified   . Osteoarthrosis, unspecified whether generalized or localized, unspecified site   . Irritable bowel syndrome   . Esophageal reflux   . Allergic rhinitis   . History of pneumonia   . Insomnia   . Anxiety   . Diverticulosis   . Alcohol abuse 2012  . Benzodiazepine abuse   . Seizures   . Alcoholic pancreatitis   . Erosive esophagitis   . Periumbilical hernia 12/13/12  . Abnormal LFTs   . Fatty liver 10/08/11  . Internal hemorrhoids 03/13/11  . Hiatal hernia 03/13/11  . Asthmatic bronchitis   . Astigmatism    AXIS IV:  economic problems, occupational problems, other psychosocial or environmental problems and problems related to social environment AXIS V:  51-60 moderate symptoms  Plan Of Care/Follow-up recommendations:  Activity:  As tolerated Diet:  Regular  Is patient on multiple antipsychotic therapies at discharge:  No   Has Patient had three or more failed trials of antipsychotic monotherapy by history:  No  Recommended Plan for Multiple Antipsychotic Therapies: Not applicable  Maddyx Vallie,JANARDHAHA R. 03/20/2013, 11:25 AM

## 2013-03-20 NOTE — Discharge Summary (Signed)
Physician Discharge Summary Note  Patient:  Martin Singh is an 47 y.o., male MRN:  [id number] DOB:  06/07/1966 Patient phone:  [phone] (home)  Patient address:   [address] [place] [address],   Date of Admission:  03/17/2013 Date of Discharge: 03/20/2013  Reason for Admission:    Discharge Diagnoses: Principal Problem:   Major depressive disorder, recurrent episode, severe, without mention of psychotic behavior Active Problems:   Generalized anxiety disorder  Review of Systems  Constitutional: Negative.  Negative for fever, chills, weight loss, malaise/fatigue and diaphoresis.  HENT: Negative for congestion and sore throat.   Eyes: Negative for blurred vision, double vision and photophobia.  Respiratory: Negative for cough, shortness of breath and wheezing.   Cardiovascular: Negative for chest pain, palpitations and PND.  Gastrointestinal: Negative for heartburn, nausea, vomiting, abdominal pain, diarrhea and constipation.  Musculoskeletal: Negative for myalgias, joint pain and falls.  Neurological: Negative for dizziness, tingling, tremors, sensory change, speech change, focal weakness, seizures, loss of consciousness, weakness and headaches.  Endo/Heme/Allergies: Negative for polydipsia. Does not bruise/bleed easily.  Psychiatric/Behavioral: Negative for depression, suicidal ideas, hallucinations, memory loss and substance abuse. The patient is not nervous/anxious and does not have insomnia.   Discharge Diagnoses:  AXIS I: Alcohol Abuse, Generalized Anxiety Disorder and Major Depression, Recurrent severe  AXIS II: Deferred  AXIS III:  Past Medical History   Diagnosis  Date   .  Hypertension    .  Depressive disorder, not elsewhere classified    .  Osteoarthrosis, unspecified whether generalized or localized, unspecified site    .  Irritable bowel syndrome    .  Esophageal reflux    .  Allergic rhinitis    .  History of pneumonia    .  Insomnia    .   Anxiety    .  Diverticulosis    .  Alcohol abuse  2012   .  Benzodiazepine abuse    .  Seizures    .  Alcoholic pancreatitis    .  Erosive esophagitis    .  Periumbilical hernia  12/13/12   .  Abnormal LFTs    .  Fatty liver  10/08/11   .  Internal hemorrhoids  03/13/11   .  Hiatal hernia  03/13/11   .  Asthmatic bronchitis    .  Astigmatism     AXIS IV: economic problems, occupational problems, other psychosocial or environmental problems and problems related to social environment  AXIS V: 51-60 moderate symptoms  Level of Care:  OP  Hospital Course:  Martin Singh was admitted to [facility] after he presented with a family member to the ED reporting increased depression with thoughs of suicide and homicide towards others.  He noted that his symptoms have worsened by losing his visitation rights to his son, likely his job as well.  He reports taking a swing at his fiancee' during sleep, financial difficulties, and poor anger management.  He was felt to be at risk for self harm and accepted to [facility] for further stabilization and crisis management.       Martin Singh was admitted to the unit and evaluated. The symptoms were identified as noted above.  He was oriented to the unit and encouraged to participate in unit programming. Medication management was initiated.        He was evaluated by a clinical provider each day to ascertain the response to treatment.  Improvement was noted by the patient's  report of decreasing symptoms, improved sleep and appetite, affect, medication tolerance, behavior, and participation in unit programming.  The patient was asked each day to complete a self inventory noting mood, mental status, pain, new symptoms, anxiety and concerns.        The patient responded well to medication and being in a therapeutic and supportive environment. Positive and appropriate behavior was noted and the patient was motivated for recovery. The patient worked closely with the treatment team and case  manager to develop a discharge plan with appropriate goals. Coping skills, problem solving as well as relaxation therapies were also part of the unit programming.         By the day of discharge Martin Singh was in much improved condition than upon admission.  Symptoms were reported as significantly decreased or resolved completely. The patient denied SI/HI and voiced no AVH. He was motivated to continue taking medication with a goal of continued improvement in mental health. He was discharged home with a plan to follow up as noted below.         Consults:  None  Significant Diagnostic Studies:  None  Discharge Vitals:   Blood pressure 120/82, pulse 67, temperature 97.4 F (36.3 C), temperature source Oral, resp. rate 16, height 5\' 1"  (1.549 m), weight 79.833 kg (176 lb). Body mass index is 33.27 kg/(m^2). Lab Results:   No results found for this or any previous visit (from the past 72 hour(s)).  Physical Findings: AIMS: Facial and Oral Movements Muscles of Facial Expression: None, normal Lips and Perioral Area: None, normal Jaw: None, normal Tongue: None, normal,Extremity Movements Upper (arms, wrists, hands, fingers): None, normal Lower (legs, knees, ankles, toes): None, normal, Trunk Movements Neck, shoulders, hips: None, normal, Overall Severity Severity of abnormal movements (highest score from questions above): None, normal Incapacitation due to abnormal movements: None, normal Patient's awareness of abnormal movements (rate only patient's report): No Awareness, Dental Status Current problems with teeth and/or dentures?: No Does patient usually wear dentures?: No (no dentures/teeth on top)  CIWA:  CIWA-Ar Total: 4 COWS:  COWS Total Score: 5  Psychiatric Specialty Exam: See Psychiatric Specialty Exam and Suicide Risk Assessment completed by Attending Physician prior to discharge.  Discharge destination:  Home  Is patient on multiple antipsychotic therapies at discharge:  No   Has  Patient had three or more failed trials of antipsychotic monotherapy by history:  No  Recommended Plan for Multiple Antipsychotic Therapies: Not applicable   Discharge Orders   Future Appointments Provider Department Dept Phone   03/31/2013 10:15 AM Etta Grandchild, MD [facility] [phone]   Future Orders Complete By Expires     Diet - low sodium heart healthy  As directed     Discharge instructions  As directed     Comments:      Take all of your medications as directed. Be sure to keep all of your follow up appointments.  If you are unable to keep your follow up appointment, call your Doctor's office to let them know, and reschedule.  Make sure that you have enough medication to last until your appointment. Be sure to get plenty of rest. Going to bed at the same time each night will help. Try to avoid sleeping during the day.  Increase your activity as tolerated. Regular exercise will help you to sleep better and improve your mental health. Eating a heart healthy diet is recommended. Try to avoid salty or fried foods. Be sure to  avoid all alcohol and illegal drugs.    Increase activity slowly  As directed         Medication List    STOP taking these medications       hydrOXYzine 25 MG tablet  Commonly known as:  ATARAX/VISTARIL     nortriptyline 10 MG capsule  Commonly known as:  PAMELOR     promethazine 25 MG tablet  Commonly known as:  PHENERGAN      TAKE these medications     Indication   albuterol 108 (90 BASE) MCG/ACT inhaler  Commonly known as:  PROVENTIL HFA;VENTOLIN HFA  Inhale 2 puffs into the lungs every 6 (six) hours as needed for wheezing or shortness of breath.    shortness of breath   aspirin 81 MG chewable tablet  Chew 81 mg by mouth daily.    for heart disease   atenolol 50 MG tablet  Commonly known as:  TENORMIN  Take 1 tablet (50 mg total) by mouth daily.   Indication:  High Blood Pressure     carbamazepine 200 MG tablet   Commonly known as:  TEGRETOL  Take 1 tablet (200 mg total) by mouth 2 (two) times daily.   Indication:  Multiple Seizure Types     cloNIDine 0.1 MG tablet  Commonly known as:  CATAPRES  Take 1 tablet (0.1 mg total) by mouth 2 (two) times daily.   Indication:  High Blood Pressure     FLUoxetine 20 MG capsule  Commonly known as:  PROZAC  Take 3 capsules (60 mg total) by mouth daily.   Indication:  Depression     gabapentin 300 MG capsule  Commonly known as:  NEURONTIN  Take 1 capsule (300 mg total) by mouth 2 (two) times daily.   Indication:  Agitation, Neurogenic Pain     gemfibrozil 600 MG tablet  Commonly known as:  LOPID  Take 1 tablet (600 mg total) by mouth 2 (two) times daily before a meal.   Indication:  Increased Fats, Triglycerides & Cholesterol in the Blood     hydrochlorothiazide 25 MG tablet  Commonly known as:  HYDRODIURIL  Take 1 tablet (25 mg total) by mouth daily.   Indication:  High Blood Pressure     naproxen 500 MG tablet  Commonly known as:  NAPROSYN  Take 500 mg by mouth 2 (two) times daily as needed (pain).    for pain   omeprazole 20 MG capsule  Commonly known as:  PRILOSEC  Take 1 capsule (20 mg total) by mouth 2 (two) times daily.   Indication:  GERD-Related Laryngitis     pantoprazole 40 MG tablet  Commonly known as:  PROTONIX  Take 1 tablet (40 mg total) by mouth 2 (two) times daily before a meal.   Indication:  Gastroesophageal Reflux Disease     ranitidine 150 MG tablet  Commonly known as:  ZANTAC  Take 1 tablet (150 mg total) by mouth 2 (two) times daily.   Indication:  Gastroesophageal Reflux Disease     traZODone 50 MG tablet  Commonly known as:  DESYREL  Take 1 tablet (50 mg total) by mouth at bedtime.   Indication:  Trouble Sleeping           Follow-up Information   Follow up with Ralene [facility] On 03/23/2013. (You are scheduled with Ralene Cork on Monday, March 23, 2013 at [facility])       Follow-up  recommendations:   Activities: Resume activity  as tolerated. Diet: Heart healthy low sodium diet Tests: Follow up testing will be determined by your out patient provider. Comments:    Total Discharge Time:  Greater than 30 minutes.  Signed: Rona Ravens. Mashburn RPAC 10:50 AM 03/20/2013  Reviewed the information documented and agree with the treatment plan.  Ailed Defibaugh,JANARDHAHA R. 03/31/2013 12:48 PM

## 2013-03-25 NOTE — Progress Notes (Signed)
Patient Discharge Instructions:  After Visit Summary (AVS):   Faxed to:  03/26/13 Psychiatric Admission Assessment Note:   Faxed to:  03/26/13 Suicide Risk Assessment - Discharge Assessment:   Faxed to:  03/26/13 Faxed/Sent to the Next Level Care provider:  03/26/13 Faxed to [facility] @ [phone] Jerelene Redden, 03/25/2013, 4:25 PM

## 2013-03-31 ENCOUNTER — Ambulatory Visit: Payer: Self-pay | Admitting: Internal Medicine

## 2013-03-31 ENCOUNTER — Ambulatory Visit (INDEPENDENT_AMBULATORY_CARE_PROVIDER_SITE_OTHER): Payer: Self-pay | Admitting: Physician Assistant

## 2013-03-31 ENCOUNTER — Telehealth: Payer: Self-pay | Admitting: Internal Medicine

## 2013-03-31 ENCOUNTER — Encounter: Payer: Self-pay | Admitting: Physician Assistant

## 2013-03-31 VITALS — BP 102/70 | HR 60 | Ht 61.0 in | Wt 177.4 lb

## 2013-03-31 DIAGNOSIS — R1012 Left upper quadrant pain: Secondary | ICD-10-CM

## 2013-03-31 DIAGNOSIS — R143 Flatulence: Secondary | ICD-10-CM

## 2013-03-31 DIAGNOSIS — R1011 Right upper quadrant pain: Secondary | ICD-10-CM

## 2013-03-31 DIAGNOSIS — R140 Abdominal distension (gaseous): Secondary | ICD-10-CM

## 2013-03-31 NOTE — Patient Instructions (Addendum)
We made you an Ultrasound appointment to [facility] for Monday 04-06-2013.  Arrive at 7:45 am . Have nothing to eat or drink after midnight.    Make an appointment with Dr. Sanda Linger for further evaluation of back pain.

## 2013-03-31 NOTE — Telephone Encounter (Signed)
Pt states he is still having pain on his right side, states it has gotten worse than it was before he was seen. Reports he is having a lot of bloating and gas also. Requesting to be seen asap. Pt scheduled to see Mike Gip PA today at 2:30pm. Pt aware of appt.

## 2013-03-31 NOTE — Progress Notes (Signed)
Agree this is not GI or organic GI process

## 2013-03-31 NOTE — Progress Notes (Signed)
Subjective:    Patient ID: Martin Singh, male    DOB: Sep 10, 1965, 47 y.o.   MRN: [id number]  HPI  Martin Singh is a 47 year old white male known to Dr. Marina Goodell who has had multiple GI complaints. He has history of GERD, EtOH abuse and previous alcohol-induced pancreatitis as well as IBS. He says he is not actively drinking over the past several months. He did have a recent admission to behavioral health for major recurrent depression in July of 2014 He was last seen in the office in May of 2014. He had gastric emptying scan ordered at that time for complaints of upper abdominal pain and bloating and this was a normal exam. He had also had CT of the abdomen and pelvis in May of 2014 which showed a fatty liver and sigmoid diverticulosis. His last EGD was done in 2009 with finding of acute gastritis. He has been taking a daily PPI and and has also been taking naproxen twice daily .He says he has been having upper abdominal discomfort over the past 6-8 weeks which she describes as feeling like "a spur". Since located in the epigastrium and seems to be associated with a sense of bloating. He says he is not constipated has been having bowel movements regularly has not noted any melena or hematochezia. He says he has had some recent urinary frequency and also had an episode of urinary incontinence at night recently. He is having pain in his lower back particularly on the right is exacerbated by movement and particularly sitting, better with lying down.Marland Kitchen He says he is able to sleep at night lying flat on his back but cannot lie on  either side due to pain.    Review of Systems  Constitutional: Positive for activity change.  HENT: Negative.   Eyes: Negative.   Respiratory: Negative.   Cardiovascular: Negative.   Gastrointestinal: Positive for abdominal pain.  Endocrine: Negative.   Genitourinary: Positive for frequency.  Musculoskeletal: Positive for back pain and arthralgias.  Skin: Negative.    Allergic/Immunologic: Negative.   Neurological: Negative.   Hematological: Negative.   Psychiatric/Behavioral: Positive for dysphoric mood.   Outpatient Prescriptions Prior to Visit  Medication Sig Dispense Refill  . albuterol (PROVENTIL HFA;VENTOLIN HFA) 108 (90 BASE) MCG/ACT inhaler Inhale 2 puffs into the lungs every 6 (six) hours as needed for wheezing or shortness of breath.       Marland Kitchen aspirin 81 MG chewable tablet Chew 81 mg by mouth daily.      Marland Kitchen atenolol (TENORMIN) 50 MG tablet Take 1 tablet (50 mg total) by mouth daily.  30 tablet  0  . carbamazepine (TEGRETOL) 200 MG tablet Take 1 tablet (200 mg total) by mouth 2 (two) times daily.  60 tablet  0  . cloNIDine (CATAPRES) 0.1 MG tablet Take 1 tablet (0.1 mg total) by mouth 2 (two) times daily.  60 tablet  0  . FLUoxetine (PROZAC) 20 MG capsule Take 3 capsules (60 mg total) by mouth daily.  90 capsule  0  . gabapentin (NEURONTIN) 300 MG capsule Take 1 capsule (300 mg total) by mouth 2 (two) times daily.  60 capsule  0  . gemfibrozil (LOPID) 600 MG tablet Take 1 tablet (600 mg total) by mouth 2 (two) times daily before a meal.      . hydrochlorothiazide (HYDRODIURIL) 25 MG tablet Take 1 tablet (25 mg total) by mouth daily.  30 tablet  0  . naproxen (NAPROSYN) 500 MG tablet Take 500 mg by  mouth 2 (two) times daily as needed (pain).      Marland Kitchen omeprazole (PRILOSEC) 20 MG capsule Take 1 capsule (20 mg total) by mouth 2 (two) times daily.      . pantoprazole (PROTONIX) 40 MG tablet Take 1 tablet (40 mg total) by mouth 2 (two) times daily before a meal.  30 tablet  0  . traZODone (DESYREL) 50 MG tablet Take 1 tablet (50 mg total) by mouth at bedtime.  30 tablet  0  . ranitidine (ZANTAC) 150 MG tablet Take 1 tablet (150 mg total) by mouth 2 (two) times daily.  60 tablet  0   No facility-administered medications prior to visit.   Allergies  Allergen Reactions  . Codeine Anaphylaxis  . Vicodin (Hydrocodone-Acetaminophen) Anaphylaxis  . Benazepril  Cough  . Ativan (Lorazepam) Other (See Comments)    Becomes violent and hallucinates  . Corticosteroids Other (See Comments)    Shaky and high blood pressure   Patient Active Problem List   Diagnosis Date Noted  . Major depressive disorder, recurrent episode, severe, without mention of psychotic behavior 03/17/2013  . Generalized anxiety disorder 03/17/2013  . Chronic daily headache 03/10/2013  . Alcoholism 01/20/2012  . Alcohol abuse 12/22/2011    Class: Acute  . Drug abuse and dependence 10/29/2011  . Pancreatitis 10/29/2011  . 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 Chronic cough 09/11/2011  . Elev Transaminase/LDH 05/24/2008  . HYPERLIPIDEMIA 05/21/2008  . HYPERTENSION 05/21/2008  . GERD 05/21/2008  . OSTEOARTHRITIS 05/21/2008   History  Substance Use Topics  . Smoking status: Former Smoker -- 1.50 packs/day for 18 years    Types: Cigarettes    Quit date: 08/28/1991  . Smokeless tobacco: Current User    Types: Chew     Comment: Tobacco info given 03/31/13  . Alcohol Use: No     Comment: no alcohol in one yr; drank 35 years alcohol, quit 02/2012   family history includes Arthritis in his father and mother; Cancer in his mother; Gallbladder disease in his mother; Heart disease in his father and mother; Hyperlipidemia in his mother; Hypertension in his father; Irritable bowel syndrome in his brother and mother; Nephrolithiasis in his mother; and Stroke in his mother.  There is no history of Colon cancer.     Objective:   Physical Exam Well-developed white male in no acute distress, blood pressure 102/70 pulse 60 height 5 foot 1 weight 177. HEENT; nontraumatic normocephalic EOMI PERRLA sclera anicteric, Supple; no JVD, Cardiovascular; regular rate and rhythm with S1-S2 no murmur or gallop, Pulmonary; clear bilaterally, Abdomen ;protuberant and fairly firm no fluid wave, no focal tenderness no palpable mass or hepatosplenomegaly bowel sounds are present. He is tender to palpation of  the costal margin on the right. Rectal; exam not done,  Back; he has some tenderness in the right SI joint region. He ambulates slowly and changes position with discomfort. Extremities; no clubbing cyanosis or edema skin warm and dry, Psych ;mood and affect       Assessment & Plan:  #38 47 year old male with a several week history of right sided pain which on exam is more consistent with costal margin pain/musculoskeletal etiology. He is also having right lower back pain. I do not think he has a primary GI etiology for his pain currently. He is convinced that he is bloated but I feel that he is protruding his abdomen in response to back pain. He had CT scan in May which was unremarkable or any causes of more  acute abdominal pain  #2 significant psychiatric disease with recent admission for major depression #3 hepatic steatosis #4 chronic GERD #5 history of EtOH abuse inactive  Plan; discussed with the patient and his caregiver/girlfriend. I asked that he get an appointment with Dr. Yetta Barre his PCP for further evaluation of his back pain. I offered a trial of Ultram however he says he was told this may have been associated with a seizure in the past- therefore we'll continue naproxen twice daily Will schedule upper abdominal ultrasound to assure no ascites etc. given his insistence that his abdomen is distended Continue Prilosec 20 mg by mouth twice daily  Continue

## 2013-04-01 ENCOUNTER — Ambulatory Visit: Payer: Self-pay | Admitting: Internal Medicine

## 2013-04-01 ENCOUNTER — Encounter: Payer: Self-pay | Admitting: Neurology

## 2013-04-01 ENCOUNTER — Ambulatory Visit (INDEPENDENT_AMBULATORY_CARE_PROVIDER_SITE_OTHER): Payer: Self-pay | Admitting: Neurology

## 2013-04-01 VITALS — BP 124/72 | HR 80 | Temp 98.2°F | Ht 60.5 in | Wt 178.0 lb

## 2013-04-01 DIAGNOSIS — R51 Headache: Secondary | ICD-10-CM

## 2013-04-01 DIAGNOSIS — R569 Unspecified convulsions: Secondary | ICD-10-CM

## 2013-04-01 NOTE — Patient Instructions (Addendum)
1.  We will slowly increase the atenolol to 1.5 tabs daily for one week, then increase to 2 tabs daily 2.  Limit use of pain relievers to no more than 2 to 3 days out of the week.  These medications include acetaminophen, ibuprofen, Excedrin.  This will help reduce risk of rebound headaches. 3.  Keep a headache diary. 4.  Stay adequately hydrated. 5.  Maintain good sleep hygiene. 6.  Maintain proper stress management. 7.  Physical therapy for the neck. 8.  Continue tegretol 200mg  three times daily 9.  MRI of brain 10.  EEG 11.  Check tegretol level. 12.  Follow up in 2 months or as needed.  Your EEG is scheduled at [facility] on Thursday, August 14th at 10:00 AM.  Please arrive at first floor admitting fifteen minutes prior to your appointment. [phone]). After the EEG is completed, you will have a MRI of your brain at 11:45 AM at [facility]. [phone])  Please enter the hospital off of [address] at [address].     We will refer you to the outpatient physical therapy located at [address] in [place] . They will call you to make the appointment.   [phone]

## 2013-04-01 NOTE — Consult Note (Signed)
Agree with assessment and plan Aneisha Skyles A. Lillyian Heidt, M.D. 

## 2013-04-01 NOTE — Progress Notes (Signed)
NEUROLOGY CONSULTATION NOTE  Martin Singh MRN: [id number] DOB: Jul 31, 1966  Referring provider: Dr. Yetta Barre Primary care provider: Dr. Yetta Barre  Reason for consult:  Seizures and chronic daily headaches.  HISTORY OF PRESENT ILLNESS: Martin Singh is a 47 year old male with history of seizures, major depressive disorder, generalized anxiety disorder, alcohol abuse, hypertension and hyperlipidemia, who presents for evaluation of chronic daily headaches and seizures.Marland Kitchen  He is accompanied by his caretaker.  Records and images were personally reviewed where available.    Seizures: Onset of seizures started about 1 year ago.  It started around the time he quit alcohol.  He will suddenly feel his whole body stiffen and shake.  He does not lose awareness.  It lasts for a couple of minutes.  To postictal state or preceding aura.  No urinary incontinence.  No tongue biting.  Will sometimes note urinary urgency but usually able to hold it in.  He also has spells that occur while asleep, as witnessed by his caretaker.  In his sleep, he would stiffen with flexed posturing of his arms and start shaking for a couple of minutes.  Sometimes he would awake but other times he would remain asleep.  In the morning, he sometimes he has wet the bed.  Spells occur about 2 times a week but can fluctuate, sometimes goes a couple of weeks without episode.  Stress and panic attacks trigger them.  He was started on tegretol after a witnessed seizure one year ago.  Onset coincides around the time he quit alcohol and lost custody of his son. He never had a formal seizure workup, such as MRI or EEG.  He has never been on any other AEDs.  He denies prior history of seizures.  Has had history of head trauma.  No family history of seizures.  Chronic daily headaches: History of migraines but has been daily for the past 2 to 3 months.  It's a nonthrobbing pressure-like pain in a band-like distribution, radiating into his neck, about 7/10.   It is associated with nausea, but not photophobia or phonophobia.  Resting and ice packs help relieve.  Activity makes it worse.  Advil, Tylenol and Excedrin are not effective.  He was taking them daily up until 2 weeks ago.  He does have neck pain.  He presented to the ED on 03/04/13, reporting that he had a seizure while laying in bed.  He felt himself tense and "seizure" for about two minutes.  He was aware during the spell and did not lose consciousness.  His PCP, Dr. Sanda Linger, increased his carbamazepine from 200mg  BID to 200mg  TID.  He also presented with worsening chronic headaches.  CT Brain was performed and was unremarkable.  He received toradol.  After he found out that his CT was okay, he was much relieved and the headache improved.  Labs were okay and he was discharged.  He soon followed up with Dr. Yetta Barre, who started him on nortriptyline.  He subsequently returned to the ED on 03/17/13 with thoughts of harming himself or others.  He was admitted to [facility].  Nortriptyline was discontinued, as a potential cause of his symptoms.  Fluoxetine was increased.  He was to continue carbamazepine and gabapentin.  CT Head (03/04/13): unremarkable. Recent labs: ETOH negative, CK 227, TSH 2.09, ESR 22, urine drug screen negative  PAST MEDICAL HISTORY: Past Medical History  Diagnosis Date  . Hypertension   . Depressive disorder, not elsewhere classified   . Osteoarthrosis, unspecified  whether generalized or localized, unspecified site   . Irritable bowel syndrome   . Esophageal reflux   . Allergic rhinitis   . History of pneumonia   . Insomnia   . Anxiety   . Diverticulosis   . Alcohol abuse 2012  . Benzodiazepine abuse   . Seizures   . Alcoholic pancreatitis   . Erosive esophagitis   . Periumbilical hernia 12/13/12  . Abnormal LFTs   . Fatty liver 10/08/11  . Internal hemorrhoids 03/13/11  . Hiatal hernia 03/13/11  . Asthmatic bronchitis   . Astigmatism   . Frequent headaches     PAST  SURGICAL HISTORY: Past Surgical History  Procedure Laterality Date  . Tibula surgery Right 2002  . Fibula fracture surgery Right     MEDICATIONS: Current Outpatient Prescriptions on File Prior to Visit  Medication Sig Dispense Refill  . atenolol (TENORMIN) 50 MG tablet Take 1 tablet (50 mg total) by mouth daily.  30 tablet  0  . carbamazepine (TEGRETOL) 200 MG tablet Take 1 tablet (200 mg total) by mouth 2 (two) times daily.  60 tablet  0  . cloNIDine (CATAPRES) 0.1 MG tablet Take 1 tablet (0.1 mg total) by mouth 2 (two) times daily.  60 tablet  0  . FLUoxetine (PROZAC) 20 MG capsule Take 3 capsules (60 mg total) by mouth daily.  90 capsule  0  . gabapentin (NEURONTIN) 300 MG capsule Take 1 capsule (300 mg total) by mouth 2 (two) times daily.  60 capsule  0  . gemfibrozil (LOPID) 600 MG tablet Take 1 tablet (600 mg total) by mouth 2 (two) times daily before a meal.      . hydrochlorothiazide (HYDRODIURIL) 25 MG tablet Take 1 tablet (25 mg total) by mouth daily.  30 tablet  0  . naproxen (NAPROSYN) 500 MG tablet Take 500 mg by mouth 2 (two) times daily as needed (pain).      Marland Kitchen omeprazole (PRILOSEC) 20 MG capsule Take 1 capsule (20 mg total) by mouth 2 (two) times daily.      . pantoprazole (PROTONIX) 40 MG tablet Take 1 tablet (40 mg total) by mouth 2 (two) times daily before a meal.  30 tablet  0  . ranitidine (ZANTAC) 150 MG tablet Take 1 tablet (150 mg total) by mouth 2 (two) times daily.  60 tablet  0  . traZODone (DESYREL) 50 MG tablet Take 1 tablet (50 mg total) by mouth at bedtime.  30 tablet  0  . albuterol (PROVENTIL HFA;VENTOLIN HFA) 108 (90 BASE) MCG/ACT inhaler Inhale 2 puffs into the lungs every 6 (six) hours as needed for wheezing or shortness of breath.       Marland Kitchen aspirin 81 MG chewable tablet Chew 81 mg by mouth daily.       No current facility-administered medications on file prior to visit.    ALLERGIES: Allergies  Allergen Reactions  . Codeine Anaphylaxis  . Vicodin  (Hydrocodone-Acetaminophen) Anaphylaxis  . Benazepril Cough  . Ativan (Lorazepam) Other (See Comments)    Becomes violent and hallucinates  . Corticosteroids Other (See Comments)    Shaky and high blood pressure    FAMILY HISTORY: Family History  Problem Relation Age of Onset  . Heart disease Mother   . Gallbladder disease Mother   . Nephrolithiasis Mother   . Arthritis Mother   . Hyperlipidemia Mother   . Stroke Mother   . Irritable bowel syndrome Mother   . Cancer Mother  vulva  . Colon cancer Neg Hx   . Hypertension Father     moth  . Heart disease Father   . Arthritis Father   . Irritable bowel syndrome Brother     x 2    SOCIAL HISTORY: History   Social History  . Marital Status: Divorced    Spouse Name: N/A    Number of Children: 1  . Years of Education: N/A   Occupational History  . unemployed    Social History Main Topics  . Smoking status: Former Smoker -- 1.50 packs/day for 18 years    Types: Cigarettes    Quit date: 08/28/1991  . Smokeless tobacco: Current User    Types: Chew     Comment: Tobacco info given 03/31/13  . Alcohol Use: No     Comment: no alcohol in one yr; drank 35 years alcohol, quit 02/2012  . Drug Use: No     Comment: benzos- last used 1 month ago; hx of THC, cocaine, uppers/downers  . Sexually Active: Yes     Comment: no birth control   Other Topics Concern  . Not on file   Social History Narrative  . No narrative on file    REVIEW OF SYSTEMS: Constitutional: No fevers, chills, or sweats, no generalized fatigue, change in appetite Eyes: No visual changes, double vision, eye pain Ear, nose and throat: No hearing loss, ear pain, nasal congestion, sore throat Cardiovascular: No chest pain, palpitations Respiratory:  No shortness of breath at rest or with exertion, wheezes GastrointestinaI: No nausea, vomiting, diarrhea, abdominal pain, fecal incontinence Genitourinary:  No dysuria, urinary retention or  frequency Musculoskeletal:  No neck pain, back pain Integumentary: No rash, pruritus, skin lesions Neurological: as above Psychiatric: No depression, insomnia, anxiety Endocrine: No palpitations, fatigue, diaphoresis, mood swings, change in appetite, change in weight, increased thirst Hematologic/Lymphatic:  No anemia, purpura, petechiae. Allergic/Immunologic: no itchy/runny eyes, nasal congestion, recent allergic reactions, rashes  PHYSICAL EXAM: Filed Vitals:   04/01/13 1436  BP: 124/72  Pulse: 80  Temp: 98.2 F (36.8 C)   General: No acute distress Head:  Normocephalic/atraumatic Neck: supple, no paraspinal tenderness, full range of motion Back: No paraspinal tenderness Heart: regular rate and rhythm Lungs: Clear to auscultation bilaterally. Vascular: No carotid bruits. Neurological Exam: Mental status: alert and oriented to person, place, time and self, speech fluent and not dysarthric, language intact. Cranial nerves: CN I: not tested CN II: pupils equal, round and reactive to light, visual fields intact, fundi unremarkable. CN III, IV, VI:  full range of motion, no nystagmus, no ptosis CN V: facial sensation intact CN VII: upper and lower face symmetric CN VIII: hearing intact CN IX, X: gag intact, uvula midline CN XI: sternocleidomastoid and trapezius muscles intact CN XII: tongue midline Bulk & Tone: normal, no fasciculations. Motor: 5/5 throughout, postural and kinetic tremor noted in both hands Sensation: endorses reduced temperature sensation in right foot.  Vibration intact. Deep Tendon Reflexes: brisk throughout, toes down Finger to nose testing: with kinetic tremor but no dysmetria Gait: mildly antalgic, no ataxia, difficulty walking in tandem. Romberg negative.  IMPRESSION & PLAN: Martin Singh is a 47 y.o. male with: 1.  History of seizures:  His seizures during the day do not seem epileptic, given that he maintains consciousness during the generalized  convulsion, panic attacks trigger them, and onset started around the time he lost custody of his son.  This may be psychogenic and related to stress.  However, spells occuring  during sleep are more suggestive of epileptic seizures.  He has never had a seizure workup.  - Continue tegretol 200mg  TID   - MRI of brain to look for structural etiology for potential seizures  - EEG  - He does not drive  2.  Chronic daily headaches:  Migrainous in nature.  Nortriptyline would have been a good choice.  Other options include topiramate, however I would prefer not adding another neuroleptic medication.    - Instead, we will further titrate atenolol from 50mg  to 100mg  daily  - His history of stress and anxiety will be problematic in controlling his headaches.  Continue therapy for this.  - Limit use of daily pain relievers to prevent rebound headaches.  - Proper sleep hygiene.  - Refer to PT for neck, as likely contributing factor.  Follow up in 2 months or as needed.  Thank you for allowing me to take part in the care of this patient.  Shon Millet, DO  CC:  Dartha Lodge, DO  Sanda Linger, MD

## 2013-04-02 ENCOUNTER — Encounter: Payer: Self-pay | Admitting: Internal Medicine

## 2013-04-02 ENCOUNTER — Ambulatory Visit (INDEPENDENT_AMBULATORY_CARE_PROVIDER_SITE_OTHER): Payer: Self-pay | Admitting: Internal Medicine

## 2013-04-02 ENCOUNTER — Other Ambulatory Visit (INDEPENDENT_AMBULATORY_CARE_PROVIDER_SITE_OTHER): Payer: Self-pay

## 2013-04-02 ENCOUNTER — Other Ambulatory Visit: Payer: Self-pay

## 2013-04-02 VITALS — BP 160/100 | HR 68 | Temp 98.2°F | Resp 16 | Ht 60.0 in | Wt 178.0 lb

## 2013-04-02 DIAGNOSIS — I10 Essential (primary) hypertension: Secondary | ICD-10-CM

## 2013-04-02 DIAGNOSIS — K589 Irritable bowel syndrome without diarrhea: Secondary | ICD-10-CM

## 2013-04-02 DIAGNOSIS — R569 Unspecified convulsions: Secondary | ICD-10-CM

## 2013-04-02 DIAGNOSIS — R7309 Other abnormal glucose: Secondary | ICD-10-CM

## 2013-04-02 DIAGNOSIS — D510 Vitamin B12 deficiency anemia due to intrinsic factor deficiency: Secondary | ICD-10-CM

## 2013-04-02 DIAGNOSIS — F1920 Other psychoactive substance dependence, uncomplicated: Secondary | ICD-10-CM

## 2013-04-02 DIAGNOSIS — F1020 Alcohol dependence, uncomplicated: Secondary | ICD-10-CM

## 2013-04-02 LAB — HEMOGLOBIN A1C: Hgb A1c MFr Bld: 5.9 % (ref 4.6–6.5)

## 2013-04-02 LAB — BASIC METABOLIC PANEL
BUN: 19 mg/dL (ref 6–23)
CO2: 28 mEq/L (ref 19–32)
Calcium: 9.7 mg/dL (ref 8.4–10.5)
Creatinine, Ser: 1.4 mg/dL (ref 0.4–1.5)
GFR: 56.56 mL/min — ABNORMAL LOW (ref >60.00)
Glucose, Bld: 115 mg/dL — ABNORMAL HIGH (ref 70–99)

## 2013-04-02 LAB — CBC WITH DIFFERENTIAL/PLATELET
Eosinophils Relative: 6.3 % — ABNORMAL HIGH (ref 0.0–5.0)
HCT: 38.4 % — ABNORMAL LOW (ref 39.0–52.0)
Hemoglobin: 13 g/dL (ref 13.0–17.0)
Lymphs Abs: 1.6 10*3/uL (ref 0.7–4.0)
MCV: 92.3 fl (ref 78.0–100.0)
Monocytes Absolute: 0.6 10*3/uL (ref 0.1–1.0)
Monocytes Relative: 14.3 % — ABNORMAL HIGH (ref 3.0–12.0)
Neutro Abs: 1.7 10*3/uL (ref 1.4–7.7)
RDW: 12.9 % (ref 11.5–14.6)
WBC: 4.2 10*3/uL — ABNORMAL LOW (ref 4.5–10.5)

## 2013-04-02 LAB — URINALYSIS, ROUTINE W REFLEX MICROSCOPIC
Ketones, ur: NEGATIVE
Leukocytes, UA: NEGATIVE
Nitrite: NEGATIVE
RBC / HPF: NONE SEEN (ref 0–?)
Specific Gravity, Urine: 1.02 (ref 1.000–1.030)
Urobilinogen, UA: 0.2 (ref 0.0–1.0)
pH: 6 (ref 5.0–8.0)

## 2013-04-02 LAB — IBC PANEL: Saturation Ratios: 18 % — ABNORMAL LOW (ref 20.0–50.0)

## 2013-04-02 MED ORDER — LINACLOTIDE 290 MCG PO CAPS: 1.0000 | ORAL_CAPSULE | Freq: Every day | ORAL | Status: DC

## 2013-04-02 MED ORDER — CYANOCOBALAMIN 250 MCG PO TABS: 250.0000 ug | ORAL_TABLET | Freq: Every day | ORAL | Status: DC

## 2013-04-02 MED ORDER — LOSARTAN POTASSIUM 100 MG PO TABS: 100.0000 mg | ORAL_TABLET | Freq: Every day | ORAL | Status: DC

## 2013-04-02 NOTE — Assessment & Plan Note (Signed)
Will check an EtOH level on him today

## 2013-04-02 NOTE — Assessment & Plan Note (Signed)
I will recheck his CBC today and will look at his vitamin levels as well 

## 2013-04-02 NOTE — Assessment & Plan Note (Signed)
I will check his A1C to see if he has developed DM2 

## 2013-04-02 NOTE — Addendum Note (Signed)
Addended by: Etta Grandchild on: 04/02/2013 04:16 PM   Modules accepted: Orders

## 2013-04-02 NOTE — Assessment & Plan Note (Signed)
He will try linzess for the pain

## 2013-04-02 NOTE — Progress Notes (Signed)
Done

## 2013-04-02 NOTE — Assessment & Plan Note (Signed)
His BP is not well controlled, I am concerned that he may have a high renin level due to the HCTZ therapy so I have asked him to add losartan to his current regimen. Today I will check his labs to look for end organ damage and to screen for secondary causes of HTN.

## 2013-04-02 NOTE — Assessment & Plan Note (Signed)
He has quite a few abnormal symptoms today I will check his UDS to see if he has had a relapse

## 2013-04-02 NOTE — Progress Notes (Signed)
Subjective:    Patient ID: Martin Singh, male    DOB: 06/05/1966, 47 y.o.   MRN: [id number]  Abdominal Pain This is a recurrent problem. The current episode started more than 1 year ago. The onset quality is gradual. The problem occurs intermittently. The problem has been unchanged. The pain is located in the RUQ. The pain is at a severity of 2/10. The pain is mild. The quality of the pain is aching. The abdominal pain does not radiate. Associated symptoms include headaches. Pertinent negatives include no anorexia, arthralgias, belching, constipation, diarrhea, dysuria, fever, flatus, frequency, hematochezia, hematuria, melena, myalgias, nausea, vomiting or weight loss. Nothing aggravates the pain. The pain is relieved by nothing. He has tried proton pump inhibitors for the symptoms. The treatment provided no relief. Prior diagnostic workup includes GI consult, CT scan and ultrasound. His past medical history is significant for GERD, irritable bowel syndrome and pancreatitis. There is no history of gallstones or PUD.      Review of Systems  Constitutional: Positive for unexpected weight change (weight gain). Negative for fever, chills, weight loss, diaphoresis, activity change, appetite change and fatigue.  Eyes: Negative.   Respiratory: Negative.  Negative for cough, chest tightness, shortness of breath, wheezing and stridor.   Cardiovascular: Negative.  Negative for chest pain, palpitations and leg swelling.  Gastrointestinal: Positive for abdominal pain. Negative for nausea, vomiting, diarrhea, constipation, melena, hematochezia, abdominal distention, anal bleeding, rectal pain, anorexia and flatus.  Endocrine: Negative.   Genitourinary: Negative.  Negative for dysuria, frequency and hematuria.  Musculoskeletal: Negative.  Negative for myalgias and arthralgias.  Skin: Negative.   Allergic/Immunologic: Negative.   Neurological: Positive for headaches. Negative for dizziness, tremors,  seizures, syncope, facial asymmetry, speech difficulty, weakness, light-headedness and numbness.  Hematological: Negative.  Negative for adenopathy. Does not bruise/bleed easily.  Psychiatric/Behavioral: Negative.        Objective:   Physical Exam  Vitals reviewed. Constitutional: He is oriented to person, place, and time. He appears well-developed and well-nourished.  Non-toxic appearance. He does not have a sickly appearance. He does not appear ill. No distress.  HENT:  Head: Normocephalic and atraumatic.  Mouth/Throat: Oropharynx is clear and moist. No oropharyngeal exudate.  Eyes: Conjunctivae are normal. Right eye exhibits no discharge. Left eye exhibits no discharge. No scleral icterus.  Neck: Normal range of motion. Neck supple. No JVD present. No tracheal deviation present. No thyromegaly present.  Cardiovascular: Normal rate, regular rhythm, normal heart sounds and intact distal pulses.  Exam reveals no gallop and no friction rub.   No murmur heard. Pulmonary/Chest: Effort normal and breath sounds normal. No stridor. No respiratory distress. He has no wheezes. He has no rales. He exhibits no tenderness.  Abdominal: Soft. Bowel sounds are normal. He exhibits no distension and no mass. There is no tenderness. There is no rebound and no guarding.  Musculoskeletal: Normal range of motion. He exhibits no edema and no tenderness.  Lymphadenopathy:    He has no cervical adenopathy.  Neurological: He is oriented to person, place, and time.  Skin: Skin is warm and dry. No rash noted. He is not diaphoretic. No erythema. No pallor.  Psychiatric: He has a normal mood and affect. His behavior is normal. Judgment and thought content normal.     Lab Results  Component Value Date   WBC 3.2* 03/16/2013   HGB 12.9* 03/16/2013   HCT 37.3* 03/16/2013   PLT 265 03/16/2013   GLUCOSE 105* 03/16/2013   CHOL 268* 03/10/2013  TRIG 435.0 Triglyceride is over 400; calculations on Lipids are invalid.*  03/10/2013   HDL 56.60 03/10/2013   LDLDIRECT 143.8 03/10/2013   LDLCALC  Value: 34        Total Cholesterol/HDL:CHD Risk Coronary Heart Disease Risk Table                     Men   Women  1/2 Average Risk   3.4   3.3  Average Risk       5.0   4.4  2 X Average Risk   9.6   7.1  3 X Average Risk  23.4   11.0        Use the calculated Patient Ratio above and the CHD Risk Table to determine the patient's CHD Risk.        ATP III CLASSIFICATION (LDL):  <100     mg/dL   Optimal  944-967  mg/dL   Near or Above                    Optimal  130-159  mg/dL   Borderline  591-638  mg/dL   High  >466     mg/dL   Very High 5/99/3570   ALT 17 03/16/2013   AST 23 03/16/2013   NA 137 03/16/2013   K 3.5 03/16/2013   CL 98 03/16/2013   CREATININE 1.20 03/16/2013   BUN 23 03/16/2013   CO2 27 03/16/2013   TSH 2.09 03/10/2013   INR 1.05 05/07/2010       Assessment & Plan:

## 2013-04-02 NOTE — Patient Instructions (Addendum)

## 2013-04-03 LAB — DRUGS OF ABUSE SCREEN W/O ALC, ROUTINE URINE
Barbiturate Quant, Ur: NEGATIVE
Cocaine Metabolites: NEGATIVE
Creatinine,U: 124.1 mg/dL
Methadone: NEGATIVE
Opiate Screen, Urine: NEGATIVE

## 2013-04-03 LAB — CARBAMAZEPINE LEVEL, TOTAL: Carbamazepine Lvl: 10 ug/mL (ref 4.0–12.0)

## 2013-04-06 ENCOUNTER — Ambulatory Visit (HOSPITAL_COMMUNITY)
Admission: RE | Admit: 2013-04-06 | Discharge: 2013-04-06 | Disposition: A | Discharge status: Home / Self Care | Payer: Self-pay | Source: Ambulatory Visit | Attending: Physician Assistant | Admitting: Physician Assistant

## 2013-04-06 DIAGNOSIS — R140 Abdominal distension (gaseous): Secondary | ICD-10-CM

## 2013-04-06 DIAGNOSIS — R109 Unspecified abdominal pain: Secondary | ICD-10-CM | POA: Insufficient documentation

## 2013-04-06 DIAGNOSIS — R1012 Left upper quadrant pain: Secondary | ICD-10-CM

## 2013-04-06 DIAGNOSIS — N281 Cyst of kidney, acquired: Secondary | ICD-10-CM | POA: Insufficient documentation

## 2013-04-06 DIAGNOSIS — R1011 Right upper quadrant pain: Secondary | ICD-10-CM

## 2013-04-07 ENCOUNTER — Encounter: Payer: Self-pay | Admitting: Internal Medicine

## 2013-04-09 ENCOUNTER — Ambulatory Visit (HOSPITAL_COMMUNITY)
Admission: RE | Admit: 2013-04-09 | Discharge: 2013-04-09 | Disposition: A | Discharge status: Home / Self Care | Payer: Self-pay | Source: Ambulatory Visit | Attending: Neurology | Admitting: Neurology

## 2013-04-09 DIAGNOSIS — M2749 Other cysts of jaw: Secondary | ICD-10-CM | POA: Insufficient documentation

## 2013-04-09 DIAGNOSIS — R569 Unspecified convulsions: Secondary | ICD-10-CM

## 2013-04-09 DIAGNOSIS — G40909 Epilepsy, unspecified, not intractable, without status epilepticus: Secondary | ICD-10-CM

## 2013-04-09 DIAGNOSIS — R51 Headache: Secondary | ICD-10-CM | POA: Insufficient documentation

## 2013-04-09 MED ORDER — GADOBENATE DIMEGLUMINE 529 MG/ML IV SOLN
17.0000 mL | Freq: Once | INTRAVENOUS | Status: AC
  Administered 2013-04-09: 17 mL via INTRAVENOUS

## 2013-04-09 NOTE — Progress Notes (Signed)
OP EEG completed

## 2013-04-13 ENCOUNTER — Encounter: Payer: Self-pay | Admitting: Neurology

## 2013-04-13 ENCOUNTER — Telehealth: Payer: Self-pay | Admitting: Internal Medicine

## 2013-04-13 DIAGNOSIS — R32 Unspecified urinary incontinence: Secondary | ICD-10-CM

## 2013-04-13 NOTE — Telephone Encounter (Signed)
Requesting referral to see urologist. Pt having urine incontinence

## 2013-04-13 NOTE — Telephone Encounter (Signed)
done

## 2013-04-21 NOTE — Procedures (Signed)
ELECTROENCEPHALOGRAM REPORT  Date of Study: 04/09/2013  Patient's Name: Martin Singh MRN: [id number] Date of Birth: [birth year]  Referring Provider: Dr. Everlena Cooper  Indication: 47 year old male with history of alcohol abuse and major depressive disorder with one year history of seizures.  Medications: Carbamazepine Gabapentin Trazodone Fluoxetine  Technical Summary: This is a multichannel digital EEG recording, using the international 10-20 placement system.  Spike detection software was employed.  Description: The EEG background is symmetric, with a well-developed posterior dominant rhythm of 7 to 8 Hz, which is reactive to eye opening and closing.  Diffuse beta activity is seen, with a bilateral frontal preponderance.  Mild generalized background slowing of delta frequencies are seen.  No focal abnormalities are seen.  No focal or generalized epileptiform discharges are seen.  Stage II sleep is not seen.  Hyperventilation and photic stimulation were performed, and produced no abnormalities.  ECG revealed normal cardiac rate and rhythm.  Impression: This is a mildly abnormal routine EEG of the awake and drowsy states, with activating procedures, due to mild background slowing, which may be secondary to pharmacologic effect.    Treana Lacour R. Everlena Cooper, DO

## 2013-04-22 ENCOUNTER — Encounter: Payer: Self-pay | Admitting: Neurology

## 2013-04-23 ENCOUNTER — Ambulatory Visit: Payer: Self-pay | Attending: Neurology | Admitting: Physical Therapy

## 2013-04-23 DIAGNOSIS — R51 Headache: Secondary | ICD-10-CM | POA: Insufficient documentation

## 2013-04-23 DIAGNOSIS — M542 Cervicalgia: Secondary | ICD-10-CM | POA: Insufficient documentation

## 2013-04-23 DIAGNOSIS — IMO0001 Reserved for inherently not codable concepts without codable children: Secondary | ICD-10-CM | POA: Insufficient documentation

## 2013-04-28 ENCOUNTER — Ambulatory Visit: Payer: Self-pay | Attending: Neurology | Admitting: Physical Therapy

## 2013-04-28 DIAGNOSIS — M542 Cervicalgia: Secondary | ICD-10-CM | POA: Insufficient documentation

## 2013-04-28 DIAGNOSIS — IMO0001 Reserved for inherently not codable concepts without codable children: Secondary | ICD-10-CM | POA: Insufficient documentation

## 2013-04-28 DIAGNOSIS — R51 Headache: Secondary | ICD-10-CM | POA: Insufficient documentation

## 2013-04-29 ENCOUNTER — Telehealth: Payer: Self-pay | Admitting: Internal Medicine

## 2013-04-29 NOTE — Telephone Encounter (Signed)
Pts caregiver states he is having a lot of mid-lower back pain that radiates around to his back. States he is also having a lot of abdominal distention. Pt requesting to see Dr. Marina Goodell. Pt scheduled to see Dr. Marina Goodell 06/01/13@3 :15pm. Pt aware of appt date and time.

## 2013-04-30 ENCOUNTER — Ambulatory Visit: Payer: Self-pay | Admitting: Physical Therapy

## 2013-05-01 ENCOUNTER — Other Ambulatory Visit: Payer: Self-pay | Admitting: Neurology

## 2013-05-01 MED ORDER — ATENOLOL 100 MG PO TABS: ORAL_TABLET | ORAL | Status: DC

## 2013-05-04 ENCOUNTER — Ambulatory Visit: Payer: Self-pay | Admitting: Physical Therapy

## 2013-05-07 ENCOUNTER — Ambulatory Visit (INDEPENDENT_AMBULATORY_CARE_PROVIDER_SITE_OTHER): Payer: Self-pay | Admitting: Internal Medicine

## 2013-05-07 ENCOUNTER — Encounter: Payer: Self-pay | Admitting: Internal Medicine

## 2013-05-07 ENCOUNTER — Ambulatory Visit: Payer: Self-pay | Admitting: Physical Therapy

## 2013-05-07 ENCOUNTER — Other Ambulatory Visit (INDEPENDENT_AMBULATORY_CARE_PROVIDER_SITE_OTHER): Payer: Self-pay

## 2013-05-07 VITALS — BP 118/84 | HR 72 | Temp 99.9°F | Resp 16 | Ht 60.0 in | Wt 182.0 lb

## 2013-05-07 DIAGNOSIS — E781 Pure hyperglyceridemia: Secondary | ICD-10-CM

## 2013-05-07 DIAGNOSIS — K859 Acute pancreatitis without necrosis or infection, unspecified: Secondary | ICD-10-CM

## 2013-05-07 DIAGNOSIS — K219 Gastro-esophageal reflux disease without esophagitis: Secondary | ICD-10-CM

## 2013-05-07 DIAGNOSIS — N189 Chronic kidney disease, unspecified: Secondary | ICD-10-CM

## 2013-05-07 DIAGNOSIS — I129 Hypertensive chronic kidney disease with stage 1 through stage 4 chronic kidney disease, or unspecified chronic kidney disease: Secondary | ICD-10-CM

## 2013-05-07 DIAGNOSIS — R7309 Other abnormal glucose: Secondary | ICD-10-CM

## 2013-05-07 DIAGNOSIS — D510 Vitamin B12 deficiency anemia due to intrinsic factor deficiency: Secondary | ICD-10-CM

## 2013-05-07 DIAGNOSIS — F411 Generalized anxiety disorder: Secondary | ICD-10-CM

## 2013-05-07 LAB — CBC WITH DIFFERENTIAL/PLATELET
Basophils Relative: 0.5 % (ref 0.0–3.0)
Eosinophils Absolute: 0.2 10*3/uL (ref 0.0–0.7)
Hemoglobin: 12.5 g/dL — ABNORMAL LOW (ref 13.0–17.0)
MCHC: 35 g/dL (ref 30.0–36.0)
MCV: 89.6 fl (ref 78.0–100.0)
Monocytes Absolute: 0.3 10*3/uL (ref 0.1–1.0)
Neutro Abs: 1.3 10*3/uL — ABNORMAL LOW (ref 1.4–7.7)
RBC: 3.99 Mil/uL — ABNORMAL LOW (ref 4.22–5.81)

## 2013-05-07 LAB — LIPID PANEL
Cholesterol: 191 mg/dL (ref 0–200)
HDL: 54.2 mg/dL (ref >39.00)
Triglycerides: 333 mg/dL — ABNORMAL HIGH (ref 0.0–149.0)

## 2013-05-07 LAB — COMPREHENSIVE METABOLIC PANEL
AST: 26 U/L (ref 0–37)
Alkaline Phosphatase: 44 U/L (ref 39–117)
BUN: 19 mg/dL (ref 6–23)
Calcium: 9.6 mg/dL (ref 8.4–10.5)
Chloride: 101 mEq/L (ref 96–112)
Creatinine, Ser: 1 mg/dL (ref 0.4–1.5)

## 2013-05-07 LAB — LIPASE: Lipase: 39 U/L (ref 11.0–59.0)

## 2013-05-07 MED ORDER — CYANOCOBALAMIN 2000 MCG PO TABS: 2000.0000 ug | ORAL_TABLET | Freq: Every day | ORAL | Status: DC

## 2013-05-07 MED ORDER — RANITIDINE HCL 300 MG PO TABS: 300.0000 mg | ORAL_TABLET | Freq: Every day | ORAL | Status: DC

## 2013-05-07 MED ORDER — HYDROCHLOROTHIAZIDE 25 MG PO TABS: 25.0000 mg | ORAL_TABLET | Freq: Every day | ORAL | Status: DC

## 2013-05-07 MED ORDER — GABAPENTIN 300 MG PO CAPS: 300.0000 mg | ORAL_CAPSULE | Freq: Two times a day (BID) | ORAL | Status: DC

## 2013-05-07 MED ORDER — CLONIDINE HCL 0.1 MG PO TABS: 0.1000 mg | ORAL_TABLET | Freq: Two times a day (BID) | ORAL | Status: DC

## 2013-05-07 NOTE — Assessment & Plan Note (Signed)
His RUQ pain is chronic, exam is normal Amylase and lipase are normal I am concerned that he is drug seeking for an opiate

## 2013-05-07 NOTE — Assessment & Plan Note (Signed)
He will work on his lifestyle modifications to improve this 

## 2013-05-07 NOTE — Progress Notes (Signed)
Subjective:    Patient ID: Martin Singh, male    DOB: [birth year], 47 y.o.   MRN: [id number]  Anemia Presents for follow-up visit. Symptoms include abdominal pain (chronic pain in his RUQ). There has been no anorexia, bruising/bleeding easily, confusion, fever, leg swelling, light-headedness, malaise/fatigue, palpitations, paresthesias, pica or weight loss. Signs of blood loss that are not present include hematemesis, hematochezia and melena. Past treatments include oral vitamin B12. Past medical history includes alcohol abuse. There is no history of chronic renal disease or hypothyroidism.  Hyperlipidemia This is a chronic problem. The current episode started more than 1 year ago. The problem is uncontrolled. Recent lipid tests were reviewed and are variable. Exacerbating diseases include obesity. He has no history of chronic renal disease, diabetes, hypothyroidism, liver disease or nephrotic syndrome. Factors aggravating his hyperlipidemia include fatty foods. Pertinent negatives include no chest pain, focal sensory loss, focal weakness, leg pain, myalgias or shortness of breath. Current antihyperlipidemic treatment includes fibric acid derivatives. The current treatment provides moderate improvement of lipids. Compliance problems include adherence to exercise and adherence to diet.       Review of Systems  Constitutional: Positive for unexpected weight change (weight gain). Negative for fever, chills, weight loss, malaise/fatigue, diaphoresis, activity change, appetite change and fatigue.  HENT: Negative.   Eyes: Negative.   Respiratory: Negative.  Negative for cough, choking, chest tightness, shortness of breath, wheezing and stridor.   Cardiovascular: Negative.  Negative for chest pain and palpitations.  Gastrointestinal: Positive for abdominal pain (chronic pain in his RUQ). Negative for nausea, vomiting, diarrhea, constipation, melena, hematochezia, abdominal distention, anal bleeding,  rectal pain, anorexia and hematemesis.  Endocrine: Negative.   Genitourinary: Negative.   Musculoskeletal: Negative.  Negative for myalgias.  Skin: Negative.   Allergic/Immunologic: Negative.   Neurological: Negative.  Negative for focal weakness, light-headedness and paresthesias.  Hematological: Negative.  Does not bruise/bleed easily.  Psychiatric/Behavioral: Negative.  Negative for confusion.       Objective:   Physical Exam  Vitals reviewed. Constitutional: He is oriented to person, place, and time. He appears well-developed and well-nourished. No distress.  HENT:  Head: Normocephalic and atraumatic.  Mouth/Throat: Oropharynx is clear and moist. No oropharyngeal exudate.  Eyes: Conjunctivae are normal. Right eye exhibits no discharge. Left eye exhibits no discharge. No scleral icterus.  Neck: Normal range of motion. Neck supple. No JVD present. No tracheal deviation present. No thyromegaly present.  Cardiovascular: Normal rate, regular rhythm, normal heart sounds and intact distal pulses.  Exam reveals no gallop and no friction rub.   No murmur heard. Pulmonary/Chest: Effort normal and breath sounds normal. No stridor. No respiratory distress. He has no wheezes. He has no rales. He exhibits no tenderness.  Abdominal: Soft. Bowel sounds are normal. He exhibits no distension and no mass. There is no tenderness. There is no rebound and no guarding.  Musculoskeletal: Normal range of motion. He exhibits no edema and no tenderness.  Lymphadenopathy:    He has no cervical adenopathy.  Neurological: He is oriented to person, place, and time.  Skin: Skin is warm and dry. No rash noted. He is not diaphoretic. No erythema. No pallor.      Lab Results  Component Value Date   WBC 4.2* 04/02/2013   HGB 13.0 04/02/2013   HCT 38.4* 04/02/2013   PLT 309.0 04/02/2013   GLUCOSE 115* 04/02/2013   CHOL 268* 03/10/2013   TRIG 435.0 Triglyceride is over 400; calculations on Lipids are invalid.*  03/10/2013  HDL 56.60 03/10/2013   LDLDIRECT 143.8 03/10/2013   LDLCALC  Value: 34        Total Cholesterol/HDL:CHD Risk Coronary Heart Disease Risk Table                     Men   Women  1/2 Average Risk   3.4   3.3  Average Risk       5.0   4.4  2 X Average Risk   9.6   7.1  3 X Average Risk  23.4   11.0        Use the calculated Patient Ratio above and the CHD Risk Table to determine the patient's CHD Risk.        ATP III CLASSIFICATION (LDL):  <100     mg/dL   Optimal  409-811  mg/dL   Near or Above                    Optimal  130-159  mg/dL   Borderline  914-782  mg/dL   High  >956     mg/dL   Very High 10/10/863   ALT 17 03/16/2013   AST 23 03/16/2013   NA 137 04/02/2013   K 4.6 04/02/2013   CL 100 04/02/2013   CREATININE 1.4 04/02/2013   BUN 19 04/02/2013   CO2 28 04/02/2013   TSH 2.09 03/10/2013   INR 1.05 05/07/2010   HGBA1C 5.9 04/02/2013      Assessment & Plan:

## 2013-05-07 NOTE — Assessment & Plan Note (Signed)
CBC is stable I have increased the dose on the oral B12 supplement

## 2013-05-07 NOTE — Assessment & Plan Note (Signed)
I will check his A1C to see if he has developed DM2 

## 2013-05-07 NOTE — Assessment & Plan Note (Signed)
Will change prilosec to zantac to see if they will be less expensive

## 2013-05-07 NOTE — Patient Instructions (Signed)
Hypertriglyceridemia  Diet for High blood levels of Triglycerides Most fats in food are triglycerides. Triglycerides in your blood are stored as fat in your body. High levels of triglycerides in your blood may put you at a greater risk for heart disease and stroke.  Normal triglyceride levels are less than 150 mg/dL. Borderline high levels are 150-199 mg/dl. High levels are 200 - 499 mg/dL, and very high triglyceride levels are greater than 500 mg/dL. The decision to treat high triglycerides is generally based on the level. For people with borderline or high triglyceride levels, treatment includes weight loss and exercise. Drugs are recommended for people with very high triglyceride levels. Many people who need treatment for high triglyceride levels have metabolic syndrome. This syndrome is a collection of disorders that often include: insulin resistance, high blood pressure, blood clotting problems, high cholesterol and triglycerides. TESTING PROCEDURE FOR TRIGLYCERIDES  You should not eat 4 hours before getting your triglycerides measured. The normal range of triglycerides is between 10 and 250 milligrams per deciliter (mg/dl). Some people may have extreme levels (1000 or above), but your triglyceride level may be too high if it is above 150 mg/dl, depending on what other risk factors you have for heart disease.  People with high blood triglycerides may also have high blood cholesterol levels. If you have high blood cholesterol as well as high blood triglycerides, your risk for heart disease is probably greater than if you only had high triglycerides. High blood cholesterol is one of the main risk factors for heart disease. CHANGING YOUR DIET  Your weight can affect your blood triglyceride level. If you are more than 20% above your ideal body weight, you may be able to lower your blood triglycerides by losing weight. Eating less and exercising regularly is the best way to combat this. Fat provides more  calories than any other food. The best way to lose weight is to eat less fat. Only 30% of your total calories should come from fat. Less than 7% of your diet should come from saturated fat. A diet low in fat and saturated fat is the same as a diet to decrease blood cholesterol. By eating a diet lower in fat, you may lose weight, lower your blood cholesterol, and lower your blood triglyceride level.  Eating a diet low in fat, especially saturated fat, may also help you lower your blood triglyceride level. Ask your dietitian to help you figure how much fat you can eat based on the number of calories your caregiver has prescribed for you.  Exercise, in addition to helping with weight loss may also help lower triglyceride levels.   Alcohol can increase blood triglycerides. You may need to stop drinking alcoholic beverages.  Too much carbohydrate in your diet may also increase your blood triglycerides. Some complex carbohydrates are necessary in your diet. These may include bread, rice, potatoes, other starchy vegetables and cereals.  Reduce "simple" carbohydrates. These may include pure sugars, candy, honey, and jelly without losing other nutrients. If you have the kind of high blood triglycerides that is affected by the amount of carbohydrates in your diet, you will need to eat less sugar and less high-sugar foods. Your caregiver can help you with this.  Adding 2-4 grams of fish oil (EPA+ DHA) may also help lower triglycerides. Speak with your caregiver before adding any supplements to your regimen. Following the Diet  Maintain your ideal weight. Your caregivers can help you with a diet. Generally, eating less food and getting more   exercise will help you lose weight. Joining a weight control group may also help. Ask your caregivers for a good weight control group in your area.  Eat low-fat foods instead of high-fat foods. This can help you lose weight too.  These foods are lower in fat. Eat MORE of these:    Dried beans, peas, and lentils.  Egg whites.  Low-fat cottage cheese.  Fish.  Lean cuts of meat, such as round, sirloin, rump, and flank (cut extra fat off meat you fix).  Whole grain breads, cereals and pasta.  Skim and nonfat dry milk.  Low-fat yogurt.  Poultry without the skin.  Cheese made with skim or part-skim milk, such as mozzarella, parmesan, farmers', ricotta, or pot cheese. These are higher fat foods. Eat LESS of these:   Whole milk and foods made from whole milk, such as American, blue, cheddar, monterey jack, and swiss cheese  High-fat meats, such as luncheon meats, sausages, knockwurst, bratwurst, hot dogs, ribs, corned beef, ground pork, and regular ground beef.  Fried foods. Limit saturated fats in your diet. Substituting unsaturated fat for saturated fat may decrease your blood triglyceride level. You will need to read package labels to know which products contain saturated fats.  These foods are high in saturated fat. Eat LESS of these:   Fried pork skins.  Whole milk.  Skin and fat from poultry.  Palm oil.  Butter.  Shortening.  Cream cheese.  Bacon.  Margarines and baked goods made from listed oils.  Vegetable shortenings.  Chitterlings.  Fat from meats.  Coconut oil.  Palm kernel oil.  Lard.  Cream.  Sour cream.  Fatback.  Coffee whiteners and non-dairy creamers made with these oils.  Cheese made from whole milk. Use unsaturated fats (both polyunsaturated and monounsaturated) moderately. Remember, even though unsaturated fats are better than saturated fats; you still want a diet low in total fat.  These foods are high in unsaturated fat:   Canola oil.  Sunflower oil.  Mayonnaise.  Almonds.  Peanuts.  Pine nuts.  Margarines made with these oils.  Safflower oil.  Olive oil.  Avocados.  Cashews.  Peanut butter.  Sunflower seeds.  Soybean oil.  Peanut  oil.  Olives.  Pecans.  Walnuts.  Pumpkin seeds. Avoid sugar and other high-sugar foods. This will decrease carbohydrates without decreasing other nutrients. Sugar in your food goes rapidly to your blood. When there is excess sugar in your blood, your liver may use it to make more triglycerides. Sugar also contains calories without other important nutrients.  Eat LESS of these:   Sugar, brown sugar, powdered sugar, jam, jelly, preserves, honey, syrup, molasses, pies, candy, cakes, cookies, frosting, pastries, colas, soft drinks, punches, fruit drinks, and regular gelatin.  Avoid alcohol. Alcohol, even more than sugar, may increase blood triglycerides. In addition, alcohol is high in calories and low in nutrients. Ask for sparkling water, or a diet soft drink instead of an alcoholic beverage. Suggestions for planning and preparing meals   Bake, broil, grill or roast meats instead of frying.  Remove fat from meats and skin from poultry before cooking.  Add spices, herbs, lemon juice or vinegar to vegetables instead of salt, rich sauces or gravies.  Use a non-stick skillet without fat or use no-stick sprays.  Cool and refrigerate stews and broth. Then remove the hardened fat floating on the surface before serving.  Refrigerate meat drippings and skim off fat to make low-fat gravies.  Serve more fish.  Use less butter,   margarine and other high-fat spreads on bread or vegetables.  Use skim or reconstituted non-fat dry milk for cooking.  Cook with low-fat cheeses.  Substitute low-fat yogurt or cottage cheese for all or part of the sour cream in recipes for sauces, dips or congealed salads.  Use half yogurt/half mayonnaise in salad recipes.  Substitute evaporated skim milk for cream. Evaporated skim milk or reconstituted non-fat dry milk can be whipped and substituted for whipped cream in certain recipes.  Choose fresh fruits for dessert instead of high-fat foods such as pies or  cakes. Fruits are naturally low in fat. When Dining Out   Order low-fat appetizers such as fruit or vegetable juice, pasta with vegetables or tomato sauce.  Select clear, rather than cream soups.  Ask that dressings and gravies be served on the side. Then use less of them.  Order foods that are baked, broiled, poached, steamed, stir-fried, or roasted.  Ask for margarine instead of butter, and use only a small amount.  Drink sparkling water, unsweetened tea or coffee, or diet soft drinks instead of alcohol or other sweet beverages. QUESTIONS AND ANSWERS ABOUT OTHER FATS IN THE BLOOD: SATURATED FAT, TRANS FAT, AND CHOLESTEROL What is trans fat? Trans fat is a type of fat that is formed when vegetable oil is hardened through a process called hydrogenation. This process helps makes foods more solid, gives them shape, and prolongs their shelf life. Trans fats are also called hydrogenated or partially hydrogenated oils.  What do saturated fat, trans fat, and cholesterol in foods have to do with heart disease? Saturated fat, trans fat, and cholesterol in the diet all raise the level of LDL "bad" cholesterol in the blood. The higher the LDL cholesterol, the greater the risk for coronary heart disease (CHD). Saturated fat and trans fat raise LDL similarly.  What foods contain saturated fat, trans fat, and cholesterol? High amounts of saturated fat are found in animal products, such as fatty cuts of meat, chicken skin, and full-fat dairy products like butter, whole milk, cream, and cheese, and in tropical vegetable oils such as palm, palm kernel, and coconut oil. Trans fat is found in some of the same foods as saturated fat, such as vegetable shortening, some margarines (especially hard or stick margarine), crackers, cookies, baked goods, fried foods, salad dressings, and other processed foods made with partially hydrogenated vegetable oils. Small amounts of trans fat also occur naturally in some animal  products, such as milk products, beef, and lamb. Foods high in cholesterol include liver, other organ meats, egg yolks, shrimp, and full-fat dairy products. How can I use the new food label to make heart-healthy food choices? Check the Nutrition Facts panel of the food label. Choose foods lower in saturated fat, trans fat, and cholesterol. For saturated fat and cholesterol, you can also use the Percent Daily Value (%DV): 5% DV or less is low, and 20% DV or more is high. (There is no %DV for trans fat.) Use the Nutrition Facts panel to choose foods low in saturated fat and cholesterol, and if the trans fat is not listed, read the ingredients and limit products that list shortening or hydrogenated or partially hydrogenated vegetable oil, which tend to be high in trans fat. POINTS TO REMEMBER:   Discuss your risk for heart disease with your caregivers, and take steps to reduce risk factors.  Change your diet. Choose foods that are low in saturated fat, trans fat, and cholesterol.  Add exercise to your daily routine if   it is not already being done. Participate in physical activity of moderate intensity, like brisk walking, for at least 30 minutes on most, and preferably all days of the week. No time? Break the 30 minutes into three, 10-minute segments during the day.  Stop smoking. If you do smoke, contact your caregiver to discuss ways in which they can help you quit.  Do not use street drugs.  Maintain a normal weight.  Maintain a healthy blood pressure.  Keep up with your blood work for checking the fats in your blood as directed by your caregiver. Document Released: 05/31/2004 Document Revised: 02/12/2012 Document Reviewed: 12/27/2008 [employer] Patient Information 2014 [employer].  

## 2013-05-08 ENCOUNTER — Emergency Department (HOSPITAL_COMMUNITY)
Admission: EM | Admit: 2013-05-08 | Discharge: 2013-05-08 | Disposition: A | Discharge status: Home / Self Care | Payer: Self-pay | Attending: Emergency Medicine | Admitting: Emergency Medicine

## 2013-05-08 ENCOUNTER — Emergency Department (HOSPITAL_COMMUNITY): Payer: Self-pay

## 2013-05-08 ENCOUNTER — Encounter (HOSPITAL_COMMUNITY): Payer: Self-pay | Admitting: Emergency Medicine

## 2013-05-08 DIAGNOSIS — Z8701 Personal history of pneumonia (recurrent): Secondary | ICD-10-CM | POA: Insufficient documentation

## 2013-05-08 DIAGNOSIS — K208 Other esophagitis without bleeding: Secondary | ICD-10-CM | POA: Insufficient documentation

## 2013-05-08 DIAGNOSIS — F3289 Other specified depressive episodes: Secondary | ICD-10-CM | POA: Insufficient documentation

## 2013-05-08 DIAGNOSIS — R350 Frequency of micturition: Secondary | ICD-10-CM | POA: Insufficient documentation

## 2013-05-08 DIAGNOSIS — F411 Generalized anxiety disorder: Secondary | ICD-10-CM | POA: Insufficient documentation

## 2013-05-08 DIAGNOSIS — R300 Dysuria: Secondary | ICD-10-CM | POA: Insufficient documentation

## 2013-05-08 DIAGNOSIS — K219 Gastro-esophageal reflux disease without esophagitis: Secondary | ICD-10-CM | POA: Insufficient documentation

## 2013-05-08 DIAGNOSIS — G4700 Insomnia, unspecified: Secondary | ICD-10-CM | POA: Insufficient documentation

## 2013-05-08 DIAGNOSIS — F329 Major depressive disorder, single episode, unspecified: Secondary | ICD-10-CM | POA: Insufficient documentation

## 2013-05-08 DIAGNOSIS — R142 Eructation: Secondary | ICD-10-CM | POA: Insufficient documentation

## 2013-05-08 DIAGNOSIS — Z79899 Other long term (current) drug therapy: Secondary | ICD-10-CM | POA: Insufficient documentation

## 2013-05-08 DIAGNOSIS — Z87891 Personal history of nicotine dependence: Secondary | ICD-10-CM | POA: Insufficient documentation

## 2013-05-08 DIAGNOSIS — J45909 Unspecified asthma, uncomplicated: Secondary | ICD-10-CM | POA: Insufficient documentation

## 2013-05-08 DIAGNOSIS — R141 Gas pain: Secondary | ICD-10-CM | POA: Insufficient documentation

## 2013-05-08 DIAGNOSIS — R32 Unspecified urinary incontinence: Secondary | ICD-10-CM | POA: Insufficient documentation

## 2013-05-08 DIAGNOSIS — R112 Nausea with vomiting, unspecified: Secondary | ICD-10-CM | POA: Insufficient documentation

## 2013-05-08 DIAGNOSIS — M1990 Unspecified osteoarthritis, unspecified site: Secondary | ICD-10-CM | POA: Insufficient documentation

## 2013-05-08 DIAGNOSIS — I10 Essential (primary) hypertension: Secondary | ICD-10-CM | POA: Insufficient documentation

## 2013-05-08 DIAGNOSIS — G40909 Epilepsy, unspecified, not intractable, without status epilepticus: Secondary | ICD-10-CM | POA: Insufficient documentation

## 2013-05-08 DIAGNOSIS — G8929 Other chronic pain: Secondary | ICD-10-CM | POA: Insufficient documentation

## 2013-05-08 DIAGNOSIS — R109 Unspecified abdominal pain: Secondary | ICD-10-CM | POA: Insufficient documentation

## 2013-05-08 DIAGNOSIS — Z7982 Long term (current) use of aspirin: Secondary | ICD-10-CM | POA: Insufficient documentation

## 2013-05-08 DIAGNOSIS — Z8669 Personal history of other diseases of the nervous system and sense organs: Secondary | ICD-10-CM | POA: Insufficient documentation

## 2013-05-08 LAB — COMPREHENSIVE METABOLIC PANEL
BUN: 26 mg/dL — ABNORMAL HIGH (ref 6–23)
CO2: 27 mEq/L (ref 19–32)
Calcium: 9.7 mg/dL (ref 8.4–10.5)
Creatinine, Ser: 1.21 mg/dL (ref 0.50–1.35)
GFR calc Af Amer: 80 mL/min — ABNORMAL LOW (ref >90)
GFR calc non Af Amer: 69 mL/min — ABNORMAL LOW (ref >90)
Glucose, Bld: 99 mg/dL (ref 70–99)
Sodium: 141 mEq/L (ref 135–145)
Total Protein: 7.5 g/dL (ref 6.0–8.3)

## 2013-05-08 LAB — CBC WITH DIFFERENTIAL/PLATELET
Eosinophils Absolute: 0.2 10*3/uL (ref 0.0–0.7)
Eosinophils Relative: 5 % (ref 0–5)
HCT: 35.9 % — ABNORMAL LOW (ref 39.0–52.0)
Lymphs Abs: 1.5 10*3/uL (ref 0.7–4.0)
MCH: 30.8 pg (ref 26.0–34.0)
MCV: 90 fL (ref 78.0–100.0)
Monocytes Absolute: 0.6 10*3/uL (ref 0.1–1.0)
Monocytes Relative: 16 % — ABNORMAL HIGH (ref 3–12)
Platelets: 258 10*3/uL (ref 150–400)
RBC: 3.99 MIL/uL — ABNORMAL LOW (ref 4.22–5.81)

## 2013-05-08 LAB — LIPASE, BLOOD: Lipase: 45 U/L (ref 11–59)

## 2013-05-08 LAB — URINALYSIS, ROUTINE W REFLEX MICROSCOPIC
Nitrite: NEGATIVE
Protein, ur: NEGATIVE mg/dL
Specific Gravity, Urine: 1.018 (ref 1.005–1.030)
Urobilinogen, UA: 0.2 mg/dL (ref 0.0–1.0)

## 2013-05-08 MED ORDER — ONDANSETRON HCL 4 MG/2ML IJ SOLN
4.0000 mg | Freq: Once | INTRAMUSCULAR | Status: AC
  Administered 2013-05-08: 4 mg via INTRAVENOUS
  Filled 2013-05-08: qty 2

## 2013-05-08 MED ORDER — HYDROMORPHONE HCL PF 1 MG/ML IJ SOLN
1.0000 mg | Freq: Once | INTRAMUSCULAR | Status: AC
  Administered 2013-05-08: 1 mg via INTRAVENOUS
  Filled 2013-05-08: qty 1

## 2013-05-08 MED ORDER — IOHEXOL 300 MG/ML  SOLN
50.0000 mL | Freq: Once | INTRAMUSCULAR | Status: AC | PRN
  Administered 2013-05-08: 50 mL via ORAL

## 2013-05-08 MED ORDER — IOHEXOL 300 MG/ML  SOLN
100.0000 mL | Freq: Once | INTRAMUSCULAR | Status: AC | PRN
  Administered 2013-05-08: 100 mL via INTRAVENOUS

## 2013-05-08 NOTE — Progress Notes (Signed)
[facility] provided pt with a list of primary care resources. Patient stated that he was receiving assistance with medical bills from [facility], but did not have the letter with him.

## 2013-05-08 NOTE — ED Notes (Signed)
PA Browning at bedside. 

## 2013-05-08 NOTE — ED Notes (Signed)
PA Browning notified that pt wants more pain meds. No further orders received.

## 2013-05-08 NOTE — ED Notes (Signed)
Pt reports right upper quadrant abdominal pain, of which patient reports that he has had since April. Pt reports that he has had urinary incontinence. Pt reports being seen by his PCP, however has been unable to see his GI provider. Pt reports nausea and emesis, however denies diarrhea. Pt reports increased abdominal distention. Pt is A/O x4 and in NAD.

## 2013-05-08 NOTE — ED Notes (Signed)
Pt states he needs more pain meds. Pt c/o continued cramping to lower abdomen. Pt denies nausea at present.

## 2013-05-08 NOTE — ED Provider Notes (Signed)
CSN: [id number]     Arrival date & time 05/08/13  1431 History   First MD Initiated Contact with Patient 05/08/13 1527     Chief Complaint  Patient presents with  . Abdominal Pain   (Consider location/radiation/quality/duration/timing/severity/associated sxs/prior Treatment) HPI Comments: Presents emergency department with chief complaint of chronic right upper quadrant pain. States that he has had pain since April. He states that he is followed up with his primary care doctor several times, but his PCP has done nothing to help him. The patient's caregiver accompanies him, and states that the patient's abdomen has become progressively more distended over the past several months, starting in April. Patient has had pancreatitis and hepatitis in the past. However, he states that he is eating healthy, and not drinking. He states that his pain is 8/10, and radiates to his back. Additionally he complains of occasional urinary incontinence, dysuria, and frequency. She denies fevers, chills, constipation, or diarrhea. States he has had normal bowel movements. Also endorses some nausea and vomiting.  The history is provided by the patient. No language interpreter was used.    Past Medical History  Diagnosis Date  . Hypertension   . Depressive disorder, not elsewhere classified   . Osteoarthrosis, unspecified whether generalized or localized, unspecified site   . Irritable bowel syndrome   . Esophageal reflux   . Allergic rhinitis   . History of pneumonia   . Insomnia   . Anxiety   . Diverticulosis   . Alcohol abuse 2012  . Benzodiazepine abuse   . Seizures   . Alcoholic pancreatitis   . Erosive esophagitis   . Periumbilical hernia 12/13/12  . Abnormal LFTs   . Fatty liver 10/08/11  . Internal hemorrhoids 03/13/11  . Hiatal hernia 03/13/11  . Asthmatic bronchitis   . Astigmatism   . Frequent headaches    Past Surgical History  Procedure Laterality Date  . Tibula surgery Right 2002  .  Fibula fracture surgery Right    Family History  Problem Relation Age of Onset  . Heart disease Mother   . Gallbladder disease Mother   . Nephrolithiasis Mother   . Arthritis Mother   . Hyperlipidemia Mother   . Stroke Mother   . Irritable bowel syndrome Mother   . Cancer Mother     vulva  . Colon cancer Neg Hx   . Hypertension Father     moth  . Heart disease Father   . Arthritis Father   . Irritable bowel syndrome Brother     x 2   History  Substance Use Topics  . Smoking status: Former Smoker -- 1.50 packs/day for 18 years    Types: Cigarettes    Quit date: 08/28/1991  . Smokeless tobacco: Current User    Types: Chew     Comment: Tobacco info given 03/31/13  . Alcohol Use: No     Comment: no alcohol in one yr; drank 35 years alcohol, quit 02/2012    Review of Systems  All other systems reviewed and are negative.    Allergies  Codeine; Benazepril; Ativan; and Corticosteroids  Home Medications   Current Outpatient Rx  Name  Route  Sig  Dispense  Refill  . albuterol (PROVENTIL HFA;VENTOLIN HFA) 108 (90 BASE) MCG/ACT inhaler   Inhalation   Inhale 2 puffs into the lungs every 6 (six) hours as needed for wheezing or shortness of breath.          Marland Kitchen aspirin 81 MG chewable tablet  Oral   Chew 81 mg by mouth daily.         Marland Kitchen atenolol (TENORMIN) 100 MG tablet      Take one tablet (100 mg total) by mouth daily.   30 tablet   3   . carbamazepine (TEGRETOL) 200 MG tablet   Oral   Take 1 tablet (200 mg total) by mouth 2 (two) times daily.   60 tablet   0   . cloNIDine (CATAPRES) 0.1 MG tablet   Oral   Take 1 tablet (0.1 mg total) by mouth 2 (two) times daily.   60 tablet   11   . FLUoxetine (PROZAC) 20 MG capsule   Oral   Take 3 capsules (60 mg total) by mouth daily.   90 capsule   0   . gabapentin (NEURONTIN) 300 MG capsule   Oral   Take 1 capsule (300 mg total) by mouth 2 (two) times daily.   60 capsule   11   . gemfibrozil (LOPID) 600 MG  tablet   Oral   Take 1 tablet (600 mg total) by mouth 2 (two) times daily before a meal.         . hydrochlorothiazide (HYDRODIURIL) 25 MG tablet   Oral   Take 1 tablet (25 mg total) by mouth daily.   90 tablet   1   . ranitidine (ZANTAC) 300 MG tablet   Oral   Take 1 tablet (300 mg total) by mouth at bedtime.   90 tablet   3   . traZODone (DESYREL) 50 MG tablet   Oral   Take 1 tablet (50 mg total) by mouth at bedtime.   30 tablet   0   . cyanocobalamin 2000 MCG tablet   Oral   Take 1 tablet (2,000 mcg total) by mouth daily.   30 tablet   11    BP 128/80  Pulse 64  Temp(Src) 98.7 F (37.1 C) (Oral)  Resp 20  SpO2 98% Physical Exam  Nursing note and vitals reviewed. Constitutional: He is oriented to person, place, and time. He appears well-developed and well-nourished.  HENT:  Head: Normocephalic and atraumatic.  Nose: Nose normal.  Mouth/Throat: Oropharynx is clear and moist. No oropharyngeal exudate.  No evidence of jaundice  Eyes: Conjunctivae and EOM are normal. Pupils are equal, round, and reactive to light. Right eye exhibits no discharge. Left eye exhibits no discharge. No scleral icterus.  Neck: Normal range of motion. Neck supple. No JVD present.  Cardiovascular: Normal rate, regular rhythm, normal heart sounds and intact distal pulses.  Exam reveals no gallop and no friction rub.   No murmur heard. Pulmonary/Chest: Effort normal and breath sounds normal. No respiratory distress. He has no wheezes. He has no rales. He exhibits no tenderness.  Abdominal: Soft. Bowel sounds are normal. He exhibits distension. He exhibits no mass. There is no tenderness. There is no rebound and no guarding.  Mildly distended abdomen, no evidence of fluid wave, or signs of peritonitis, no focal abdominal tenderness  Musculoskeletal: Normal range of motion. He exhibits no edema and no tenderness.  Neurological: He is alert and oriented to person, place, and time.  CN 3-12  intact  Skin: Skin is warm and dry.  Psychiatric: He has a normal mood and affect. His behavior is normal. Judgment and thought content normal.    ED Course  Procedures (including critical care time) Labs Review Labs Reviewed  CBC WITH DIFFERENTIAL  COMPREHENSIVE METABOLIC PANEL  LIPASE,  BLOOD  URINALYSIS, ROUTINE W REFLEX MICROSCOPIC   Results for orders placed during the hospital encounter of 05/08/13  CBC WITH DIFFERENTIAL      Result Value Range   WBC 3.9 (*) 4.0 - 10.5 K/uL   RBC 3.99 (*) 4.22 - 5.81 MIL/uL   Hemoglobin 12.3 (*) 13.0 - 17.0 g/dL   HCT 78.4 (*) 69.6 - 29.5 %   MCV 90.0  78.0 - 100.0 fL   MCH 30.8  26.0 - 34.0 pg   MCHC 34.3  30.0 - 36.0 g/dL   RDW 28.4  13.2 - 44.0 %   Platelets 258  150 - 400 K/uL   Neutrophils Relative % 39 (*) 43 - 77 %   Neutro Abs 1.5 (*) 1.7 - 7.7 K/uL   Lymphocytes Relative 40  12 - 46 %   Lymphs Abs 1.5  0.7 - 4.0 K/uL   Monocytes Relative 16 (*) 3 - 12 %   Monocytes Absolute 0.6  0.1 - 1.0 K/uL   Eosinophils Relative 5  0 - 5 %   Eosinophils Absolute 0.2  0.0 - 0.7 K/uL   Basophils Relative 1  0 - 1 %   Basophils Absolute 0.0  0.0 - 0.1 K/uL  COMPREHENSIVE METABOLIC PANEL      Result Value Range   Sodium 141  135 - 145 mEq/L   Potassium 4.0  3.5 - 5.1 mEq/L   Chloride 102  96 - 112 mEq/L   CO2 27  19 - 32 mEq/L   Glucose, Bld 99  70 - 99 mg/dL   BUN 26 (*) 6 - 23 mg/dL   Creatinine, Ser 1.02  0.50 - 1.35 mg/dL   Calcium 9.7  8.4 - 72.5 mg/dL   Total Protein 7.5  6.0 - 8.3 g/dL   Albumin 4.4  3.5 - 5.2 g/dL   AST 24  0 - 37 U/L   ALT 19  0 - 53 U/L   Alkaline Phosphatase 50  39 - 117 U/L   Total Bilirubin 0.3  0.3 - 1.2 mg/dL   GFR calc non Af Amer 69 (*) >90 mL/min   GFR calc Af Amer 80 (*) >90 mL/min  LIPASE, BLOOD      Result Value Range   Lipase 45  11 - 59 U/L  URINALYSIS, ROUTINE W REFLEX MICROSCOPIC      Result Value Range   Color, Urine YELLOW  YELLOW   APPearance CLEAR  CLEAR   Specific Gravity, Urine  1.018  1.005 - 1.030   pH 6.0  5.0 - 8.0   Glucose, UA NEGATIVE  NEGATIVE mg/dL   Hgb urine dipstick NEGATIVE  NEGATIVE   Bilirubin Urine NEGATIVE  NEGATIVE   Ketones, ur NEGATIVE  NEGATIVE mg/dL   Protein, ur NEGATIVE  NEGATIVE mg/dL   Urobilinogen, UA 0.2  0.0 - 1.0 mg/dL   Nitrite NEGATIVE  NEGATIVE   Leukocytes, UA NEGATIVE  NEGATIVE   Mr Laqueta Jean Wo Contrast  04/09/2013   *RADIOLOGY REPORT*  Clinical Data: Seizure.  Headaches  MRI HEAD WITHOUT AND WITH CONTRAST  Technique:  Multiplanar, multiecho pulse sequences of the brain and surrounding structures were obtained according to standard protocol without and with intravenous contrast  Contrast: 17mL MULTIHANCE GADOBENATE DIMEGLUMINE 529 MG/ML IV SOLN  Comparison: CT head 03/04/2013  Findings: Ventricle size is normal.  Craniocervical junction is normal.  Pituitary is normal in size.  Negative for acute infarct.  Several small subcortical white matter hyperintensities bilaterally  appear chronic.  Negative for hemorrhage or mass lesion.  Hippocampus and temporal lobe is normal in volume and signal bilaterally.  Negative for mesial temporal sclerosis.  Normal enhancement following contrast administration.  Mucous retention cyst right maxillary sinus.  IMPRESSION: No acute abnormality.  Scattered small white matter hyperintensities, likely due to chronic microvascular ischemia, very mild in degree.   Original Report Authenticated By: Janeece Riggers, M.D.   Ct Abdomen Pelvis W Contrast  05/08/2013   CLINICAL DATA:  47 year old male with right upper quadrant pain, incontinence. Nausea and emesis.  EXAM: CT ABDOMEN AND PELVIS WITH CONTRAST  TECHNIQUE: Multidetector CT imaging of the abdomen and pelvis was performed using the standard protocol following bolus administration of intravenous contrast.  CONTRAST:  50mL OMNIPAQUE IOHEXOL 300 MG/ML SOLN, OMNIPAQUE IOHEXOL 300 MG/ML SOLN  COMPARISON:  11/1912 and earlier.  FINDINGS: Negative lung bases.  Mediastinal lipomatosis. No pericardial or pleural effusion. No acute osseous abnormality identified.  No pelvic free fluid. Occasional sigmoid diverticula without active inflammation. Negative left colon. Negative transverse colon. Normal right colon and appendix. Terminal ileum within normal limits. No dilated small bowel. Small diverticulum of the gastric fundus re- identified and stable (series 2, image 22). Otherwise negative stomach and duodenum.  Slight decreased density throughout the liver compared to the spleen. No liver lesion. Negative gallbladder, portal venous system, spleen, pancreas, adrenal glands, and kidneys. Negative right ureter. Unremarkable bladder. No abdominal free fluid. Major arterial structures in the abdomen and pelvis are patent and within normal limits.  IMPRESSION: Stable and largely unremarkable abdomen and pelvis. Small juxtacardiac gastric diverticulum is stable.   Electronically Signed   By: Augusto Gamble M.D.   On: 05/08/2013 17:08      MDM   1. Abdominal  pain, other specified site   2. Urinary frequency     Patient presents with chronic right upper quadrant pain. In reviewing his notes from his PCP, there is concern for narcotic seeking behavior. However, given the patient and his caregiver report that his abdomen has become progressively more distended since April, I feel that it is appropriate to order a new CT scan to further evaluate this, so we'll compare to previous CT scans before the abdomen became "distended."  Will check basic labs, lipase, and will give some pain medicine.  Labs and CT are unremarkable for explaining the patient's symptoms.  Had a long conversation about appropriate follow-up with the patient and the caregiver.  They understand and agree with the plan.  Tolerating PO here.  VSS.  Ready for discharge.    Roxy Horseman, PA-C 05/08/13 1816

## 2013-05-12 ENCOUNTER — Ambulatory Visit: Payer: Self-pay | Admitting: Physical Therapy

## 2013-05-14 ENCOUNTER — Ambulatory Visit: Payer: Self-pay | Admitting: Physical Therapy

## 2013-05-19 ENCOUNTER — Ambulatory Visit: Payer: Self-pay | Admitting: Physical Therapy

## 2013-05-20 NOTE — ED Provider Notes (Signed)
Medical screening examination/treatment/procedure(s) were performed by non-physician practitioner and as supervising physician I was immediately available for consultation/collaboration.   Gwenn Teodoro J Jesusmanuel Erbes, MD 05/20/13 1057 

## 2013-05-21 ENCOUNTER — Ambulatory Visit: Payer: Self-pay | Admitting: Physical Therapy

## 2013-05-26 ENCOUNTER — Ambulatory Visit: Payer: Self-pay | Admitting: Physical Therapy

## 2013-05-28 ENCOUNTER — Ambulatory Visit: Payer: Self-pay | Attending: Neurology | Admitting: Physical Therapy

## 2013-05-28 DIAGNOSIS — R51 Headache: Secondary | ICD-10-CM | POA: Insufficient documentation

## 2013-05-28 DIAGNOSIS — M542 Cervicalgia: Secondary | ICD-10-CM | POA: Insufficient documentation

## 2013-05-28 DIAGNOSIS — IMO0001 Reserved for inherently not codable concepts without codable children: Secondary | ICD-10-CM | POA: Insufficient documentation

## 2013-06-01 ENCOUNTER — Encounter: Payer: Self-pay | Admitting: Internal Medicine

## 2013-06-01 ENCOUNTER — Ambulatory Visit (INDEPENDENT_AMBULATORY_CARE_PROVIDER_SITE_OTHER): Payer: Self-pay | Admitting: Internal Medicine

## 2013-06-01 VITALS — BP 110/80 | HR 72 | Ht 60.0 in | Wt 181.1 lb

## 2013-06-01 DIAGNOSIS — R141 Gas pain: Secondary | ICD-10-CM

## 2013-06-01 DIAGNOSIS — R1011 Right upper quadrant pain: Secondary | ICD-10-CM

## 2013-06-01 DIAGNOSIS — K219 Gastro-esophageal reflux disease without esophagitis: Secondary | ICD-10-CM

## 2013-06-01 DIAGNOSIS — R140 Abdominal distension (gaseous): Secondary | ICD-10-CM

## 2013-06-01 MED ORDER — ESOMEPRAZOLE MAGNESIUM 40 MG PO CPDR: 40.0000 mg | DELAYED_RELEASE_CAPSULE | Freq: Every day | ORAL | Status: DC

## 2013-06-01 NOTE — Patient Instructions (Addendum)
You have been given some samples of Nexium.  Take one tablet 30 minutes before breakfast  Please follow up with Dr. Marina Goodell as needed

## 2013-06-01 NOTE — Progress Notes (Signed)
HISTORY OF PRESENT ILLNESS:  Martin Singh is a 47 y.o. 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 He presents today for evaluation of abdominal pain. He has been seen on multiple occasions for this complaint. Also complains of abdominal distention. He is accompanied by his girlfriend. I last saw the patient in July of 2012 for chronic abdominal pain. Abdominal ultrasound was unremarkable. Colonoscopy including intubation of the terminal ileum was negative except for moderate left-sided diverticulosis and internal hemorrhoids. Upper endoscopy revealed esophagitis and mild gastritis (Helicobacter pylori negative) but was otherwise normal. He tells me that he has been abstinent from alcohol for about a year and a half. Problems with abdominal distention have occurred over the past 6 months. He was seen by our physician assistant in August. Subsequent ultrasound was unremarkable. He was seen in the emergency room and September. CT scan was unremarkable. As well, laboratories in September were unremarkable including normal liver tests, pancreas enzymes, and CBC. His current complaint is a sharp spur like pain in right upper quadrant. He also has complaints of back pain that are unrelated. Multiple urologic complaints for which she has a urology appointment tomorrow. He has had to stop taking some of his medications because he has no insurance. Not on PPI at this time. He has gained 25 pounds in the past 2 years  REVIEW OF SYSTEMS:  All non-GI ROS negative except for anxiety, arthritis, back pain, depression, fever, headaches, hearing problems, itching, muscle cramps, night sweats, shortness of breath, excessive urination, urinary frequency, urinary leakage  Past  Medical History  Diagnosis Date  . Hypertension   . Depressive disorder, not elsewhere classified   . Osteoarthrosis, unspecified whether generalized or localized, unspecified site   . Irritable bowel syndrome   . Esophageal reflux   . Allergic rhinitis   . History of pneumonia   . Insomnia   . Anxiety   . Diverticulosis   . Alcohol abuse 2012  . Benzodiazepine abuse   . Seizures   . Alcoholic pancreatitis   . Erosive esophagitis   . Periumbilical hernia 12/13/12  . Abnormal LFTs   . Fatty liver 10/08/11  . Internal hemorrhoids 03/13/11  . Hiatal hernia 03/13/11  . Asthmatic bronchitis   . Astigmatism   . Frequent headaches     Past Surgical History  Procedure Laterality Date  . Tibula surgery Right 2002  . Fibula fracture surgery Right     Social History Martin Singh  reports that he quit smoking about 21 years ago. His smoking use included Cigarettes. He has a 27 pack-year smoking history. His smokeless tobacco use includes Chew. He reports that he does not drink alcohol or use illicit drugs.  family history includes Arthritis in his father and mother; Cancer in his mother; Gallbladder disease in his mother; Heart disease in his father and mother; Hyperlipidemia in his mother; Hypertension in his father; Irritable bowel syndrome in his brother and mother; Nephrolithiasis in his mother; Stroke in his mother. There is no history of Colon cancer.  Allergies  Allergen Reactions  . Codeine Anaphylaxis  . Benazepril Cough  . Ativan [Lorazepam] Other (See Comments)    Becomes violent and hallucinates  . Corticosteroids Other (See Comments)    Shaky and high blood pressure       PHYSICAL EXAMINATION:  Vital signs: BP 110/80  Pulse 72  Ht 5' (1.524 m)  Wt 181 lb 2 oz (82.158 kg)  BMI 35.37 kg/m2 General: Well-developed, well-nourished, no acute distress HEENT: Sclerae are anicteric, conjunctiva pink. Oral mucosa intact Lungs: Clear Heart: Regular Abdomen: soft, obese,  nontender, nondistended, no obvious ascites, no peritoneal signs, normal bowel sounds. No organomegaly. Extremities: No edema Psychiatric: alert and oriented x3. Cooperative   ASSESSMENT:  #1. Right upper quadrant abdominal pain is not primary GI in origin. It is musculoskeletal or functional #2. GERD. Expect recurrent symptoms off PPI #3. Abdominal distention with discomfort. Secondary to truncal obesity. The patient has gained 25 pounds in 2 years. His BMI is 35 #4. Multiple somatic complaints, non-GI.   PLAN:  #1. PPI samples provided. Hopefully he can find affordable medication because his GERD will become an issue given prior erosive esophagitis #2. No further GI workup planned for chronic abdominal complaints. He has had an overwhelmingly extensive workup without significant findings. #3. Resume general medical care with PCP

## 2013-06-02 ENCOUNTER — Ambulatory Visit (INDEPENDENT_AMBULATORY_CARE_PROVIDER_SITE_OTHER): Payer: Self-pay | Admitting: Urology

## 2013-06-02 ENCOUNTER — Ambulatory Visit: Payer: Self-pay | Admitting: Physical Therapy

## 2013-06-02 DIAGNOSIS — M545 Low back pain, unspecified: Secondary | ICD-10-CM

## 2013-06-02 DIAGNOSIS — F5232 Male orgasmic disorder: Secondary | ICD-10-CM

## 2013-06-02 DIAGNOSIS — R350 Frequency of micturition: Secondary | ICD-10-CM

## 2013-06-02 DIAGNOSIS — N3942 Incontinence without sensory awareness: Secondary | ICD-10-CM

## 2013-06-03 ENCOUNTER — Ambulatory Visit (INDEPENDENT_AMBULATORY_CARE_PROVIDER_SITE_OTHER): Payer: Self-pay | Admitting: Neurology

## 2013-06-03 ENCOUNTER — Encounter: Payer: Self-pay | Admitting: Neurology

## 2013-06-03 VITALS — BP 128/80 | HR 62 | Temp 98.4°F | Ht 60.0 in | Wt 179.0 lb

## 2013-06-03 DIAGNOSIS — R51 Headache: Secondary | ICD-10-CM

## 2013-06-03 DIAGNOSIS — R569 Unspecified convulsions: Secondary | ICD-10-CM

## 2013-06-03 MED ORDER — CARBAMAZEPINE 200 MG PO TABS: ORAL_TABLET | ORAL | Status: DC

## 2013-06-03 NOTE — Patient Instructions (Signed)
1.  Increase carbamazepine (Tegretol) 200mg  tablets to 1 tablet in morning, 1 tablet in afternoon and 2 tablets at bedtime. We will draw a level in one week. 2.  Continue atenolol as prescribed.  Do not take pain medications more than 10 days out of the month. 3.  No driving as per [place] law. 4.  Follow up in 3 months.  Call in one month with update.

## 2013-06-03 NOTE — Progress Notes (Signed)
NEUROLOGY FOLLOW UP OFFICE NOTE  ALIAS VILLAGRAN [id number]  HISTORY OF PRESENT ILLNESS: Martin Singh is a 47 year old man who is following up for chronic daily headaches and seizures.  Records and images were personally reviewed where available.    Seizures: Onset of seizures started about 1 year ago, around the time he quit alcohol and lost custody of his son.  It started around the time he quit alcohol.  He will suddenly feel his whole body stiffen and shake.  He does not lose awareness.  It lasts for a couple of minutes.  To postictal state or preceding aura.  No urinary incontinence.  No tongue biting.  Will sometimes note urinary urgency but usually able to hold it in.  Stress and panic attacks trigger them.  He also has spells that occur while asleep, as witnessed by his caretaker.  In his sleep, he would stiffen with flexed posturing of his arms and start shaking for a couple of minutes.  Sometimes he would awake but other times he would remain asleep.  In the morning, he sometimes he has wet the bed.  Spells occur about 2 times a week but can fluctuate, sometimes goes a couple of weeks without episode.  He was started on tegretol 200mg  TID.  Since last visit, daytime spells improved, but he still has spells out of sleep 1-2x/week as per his fiancee.  His last daytime spell was 2 weeks ago.  His last nighttime spell was on Monday.  04/13/13 MRI Brain w/wo:  mild small vessel changes, but nothing remarkable. 04/21/13 EEG revealed mild generalized background slowing, likely pharmacologic effect.  Chronic daily headaches, migrainous in nature: History of migraines but has been daily for the past 2 to 3 months.  It's a nonthrobbing pressure-like pain in a band-like distribution, radiating into his neck, about 7/10.  It is associated with nausea, but not photophobia or phonophobia.  Resting and ice packs help relieve.  Activity makes it worse.  Advil, Tylenol and Excedrin are not effective.  He was  taking them daily up until 2 weeks ago.  He does have neck pain.  He was initially on nortriptyline, which was discontinued after it was thought to have contributed to suicidal ideation.  He was titrated up on atenolol to 100mg  daily.  He went to PT for the neck.    Since last visit, the headaches have much improved.  They are no longer constant and only occur a small handful of times a month.  They are manageable.  He thinks PT and atenolol helps.  CT Head (03/04/13): unremarkable. Recent labs: ETOH negative, CK 227, TSH 2.09, ESR 22, urine drug screen negative.  PAST MEDICAL HISTORY: Past Medical History  Diagnosis Date  . Hypertension   . Depressive disorder, not elsewhere classified   . Osteoarthrosis, unspecified whether generalized or localized, unspecified site   . Irritable bowel syndrome   . Esophageal reflux   . Allergic rhinitis   . History of pneumonia   . Insomnia   . Anxiety   . Diverticulosis   . Alcohol abuse 2012  . Benzodiazepine abuse   . Seizures   . Alcoholic pancreatitis   . Erosive esophagitis   . Periumbilical hernia 12/13/12  . Abnormal LFTs   . Fatty liver 10/08/11  . Internal hemorrhoids 03/13/11  . Hiatal hernia 03/13/11  . Asthmatic bronchitis   . Astigmatism   . Frequent headaches     MEDICATIONS: Current Outpatient Prescriptions on File Prior  to Visit  Medication Sig Dispense Refill  . albuterol (PROVENTIL HFA;VENTOLIN HFA) 108 (90 BASE) MCG/ACT inhaler Inhale 2 puffs into the lungs every 6 (six) hours as needed for wheezing or shortness of breath.       Marland Kitchen aspirin 81 MG chewable tablet Chew 81 mg by mouth daily.      Marland Kitchen atenolol (TENORMIN) 100 MG tablet Take one tablet (100 mg total) by mouth daily.  30 tablet  3  . cloNIDine (CATAPRES) 0.1 MG tablet Take 1 tablet (0.1 mg total) by mouth 2 (two) times daily.  60 tablet  11  . cyanocobalamin 2000 MCG tablet Take 1 tablet (2,000 mcg total) by mouth daily.  30 tablet  11  . esomeprazole (NEXIUM) 40 MG  capsule Take 1 capsule (40 mg total) by mouth daily before breakfast.  30 capsule  0  . FLUoxetine (PROZAC) 20 MG capsule Take 3 capsules (60 mg total) by mouth daily.  90 capsule  0  . gabapentin (NEURONTIN) 300 MG capsule Take 1 capsule (300 mg total) by mouth 2 (two) times daily.  60 capsule  11  . hydrochlorothiazide (HYDRODIURIL) 25 MG tablet Take 1 tablet (25 mg total) by mouth daily.  90 tablet  1  . omeprazole (PRILOSEC) 40 MG capsule Take 40 mg by mouth daily.      . ranitidine (ZANTAC) 300 MG tablet Take 1 tablet (300 mg total) by mouth at bedtime.  90 tablet  3  . traZODone (DESYREL) 50 MG tablet Take 1 tablet (50 mg total) by mouth at bedtime.  30 tablet  0  . gemfibrozil (LOPID) 600 MG tablet Take 1 tablet (600 mg total) by mouth 2 (two) times daily before a meal.       No current facility-administered medications on file prior to visit.    ALLERGIES: Allergies  Allergen Reactions  . Codeine Anaphylaxis  . Benazepril Cough  . Ativan [Lorazepam] Other (See Comments)    Becomes violent and hallucinates  . Corticosteroids Other (See Comments)    Shaky and high blood pressure    FAMILY HISTORY: Family History  Problem Relation Age of Onset  . Heart disease Mother   . Gallbladder disease Mother   . Nephrolithiasis Mother   . Arthritis Mother   . Hyperlipidemia Mother   . Stroke Mother   . Irritable bowel syndrome Mother   . Cancer Mother     vulva  . Colon cancer Neg Hx   . Hypertension Father     moth  . Heart disease Father   . Arthritis Father   . Irritable bowel syndrome Brother     x 2    SOCIAL HISTORY: History   Social History  . Marital Status: Divorced    Spouse Name: N/A    Number of Children: 1  . Years of Education: N/A   Occupational History  . unemployed    Social History Main Topics  . Smoking status: Former Smoker -- 1.50 packs/day for 18 years    Types: Cigarettes    Quit date: 08/28/1991  . Smokeless tobacco: Current User     Types: Chew     Comment: Tobacco info given 03/31/13  . Alcohol Use: No     Comment: no alcohol in one yr; drank 35 years alcohol, quit 02/2012  . Drug Use: No     Comment: benzos- last used 1 month ago; hx of THC, cocaine, uppers/downers  . Sexual Activity: Yes     Comment: no  birth control   Other Topics Concern  . Not on file   Social History Narrative  . No narrative on file    PHYSICAL EXAM: Filed Vitals:   06/03/13 0849  BP: 128/80  Pulse: 62  Temp: 98.4 F (36.9 C)   General: No acute distress Head:  Normocephalic/atraumatic Neck: supple, no paraspinal tenderness, full range of motion Back: No paraspinal tenderness Neurological Exam: alert and oriented to person, place, and time, speech fluent and not dysarthric, language intact.  CN II-XII intact, bulk and tone normal, muscle strength 5/5 throughout, sensation to light touch, temperature and vibration intact, deep tendon reflexes 2+ throughout, toes downgoing, finger to nose intact, gait normal, Romberg negative.  IMPRESSION: 1.  Seizures.  Based on semiology, daytime seizures do not seem epileptic.  I cannot say that for the spells out of sleep, however. 2.  Chronic daily headaches, migrainous.  PLAN: 1.  Increase evening dose of Tegretol to 200mg  qAM, 200mg  qNOON, and 400mg  qhs.  Check trough level in one week. 2.  Continue atenolol.  Limit pain meds to no more than 10 days out of month to prevent rebound headache. 3.  No driving as per [place] law. 4.  Follow up in 3 months.  Call in one month with update.  Shon Millet, DO  CC:  Sanda Linger, MD

## 2013-06-04 ENCOUNTER — Ambulatory Visit: Payer: Self-pay | Admitting: Physical Therapy

## 2013-06-05 ENCOUNTER — Ambulatory Visit (INDEPENDENT_AMBULATORY_CARE_PROVIDER_SITE_OTHER): Payer: Self-pay

## 2013-06-05 DIAGNOSIS — Z23 Encounter for immunization: Secondary | ICD-10-CM

## 2013-06-08 ENCOUNTER — Ambulatory Visit: Payer: Self-pay | Admitting: Physical Therapy

## 2013-06-10 ENCOUNTER — Ambulatory Visit: Payer: Self-pay | Admitting: Physical Therapy

## 2013-06-12 ENCOUNTER — Encounter: Payer: Self-pay | Admitting: Rehabilitation

## 2013-06-12 ENCOUNTER — Telehealth: Payer: Self-pay | Admitting: *Deleted

## 2013-06-12 NOTE — Telephone Encounter (Signed)
Pt called requesting a muscle relaxer or pain medication for back pain.  Please advise

## 2013-06-12 NOTE — Telephone Encounter (Signed)
no

## 2013-06-12 NOTE — Telephone Encounter (Signed)
Left message on pts identified Vm of MDs message

## 2013-06-16 ENCOUNTER — Ambulatory Visit: Payer: Self-pay | Admitting: Physical Therapy

## 2013-06-18 ENCOUNTER — Ambulatory Visit: Payer: Self-pay | Admitting: Physical Therapy

## 2013-06-25 ENCOUNTER — Ambulatory Visit (INDEPENDENT_AMBULATORY_CARE_PROVIDER_SITE_OTHER): Payer: Self-pay | Admitting: Internal Medicine

## 2013-06-25 ENCOUNTER — Encounter: Payer: Self-pay | Admitting: Internal Medicine

## 2013-06-25 VITALS — BP 132/84 | HR 64 | Temp 98.1°F | Resp 16 | Wt 186.0 lb

## 2013-06-25 DIAGNOSIS — D510 Vitamin B12 deficiency anemia due to intrinsic factor deficiency: Secondary | ICD-10-CM

## 2013-06-25 DIAGNOSIS — M549 Dorsalgia, unspecified: Secondary | ICD-10-CM

## 2013-06-25 DIAGNOSIS — G8929 Other chronic pain: Secondary | ICD-10-CM

## 2013-06-25 DIAGNOSIS — I129 Hypertensive chronic kidney disease with stage 1 through stage 4 chronic kidney disease, or unspecified chronic kidney disease: Secondary | ICD-10-CM

## 2013-06-25 DIAGNOSIS — F411 Generalized anxiety disorder: Secondary | ICD-10-CM

## 2013-06-25 DIAGNOSIS — K219 Gastro-esophageal reflux disease without esophagitis: Secondary | ICD-10-CM

## 2013-06-25 DIAGNOSIS — N189 Chronic kidney disease, unspecified: Secondary | ICD-10-CM

## 2013-06-25 MED ORDER — ESOMEPRAZOLE MAGNESIUM 40 MG PO CPDR: 40.0000 mg | DELAYED_RELEASE_CAPSULE | Freq: Every day | ORAL | Status: DC

## 2013-06-25 MED ORDER — CLONIDINE HCL 0.1 MG PO TABS: 0.1000 mg | ORAL_TABLET | Freq: Two times a day (BID) | ORAL | Status: DC

## 2013-06-25 MED ORDER — CARBAMAZEPINE 200 MG PO TABS: ORAL_TABLET | ORAL | Status: DC

## 2013-06-25 MED ORDER — RANITIDINE HCL 300 MG PO TABS: 300.0000 mg | ORAL_TABLET | Freq: Every day | ORAL | Status: DC

## 2013-06-25 MED ORDER — ATENOLOL 100 MG PO TABS: ORAL_TABLET | ORAL | Status: DC

## 2013-06-25 MED ORDER — FLUOXETINE HCL 20 MG PO CAPS: 60.0000 mg | ORAL_CAPSULE | Freq: Every day | ORAL | Status: DC

## 2013-06-25 MED ORDER — OMEPRAZOLE 40 MG PO CPDR: 40.0000 mg | DELAYED_RELEASE_CAPSULE | Freq: Every day | ORAL | Status: DC

## 2013-06-25 MED ORDER — GABAPENTIN 300 MG PO CAPS: 300.0000 mg | ORAL_CAPSULE | Freq: Two times a day (BID) | ORAL | Status: DC

## 2013-06-25 MED ORDER — HYDROCHLOROTHIAZIDE 25 MG PO TABS: 25.0000 mg | ORAL_TABLET | Freq: Every day | ORAL | Status: DC

## 2013-06-25 MED ORDER — GEMFIBROZIL 600 MG PO TABS: 600.0000 mg | ORAL_TABLET | Freq: Two times a day (BID) | ORAL | Status: DC

## 2013-06-25 MED ORDER — CYANOCOBALAMIN 2000 MCG PO TABS: 2000.0000 ug | ORAL_TABLET | Freq: Every day | ORAL | Status: DC

## 2013-06-25 NOTE — Assessment & Plan Note (Signed)
His BP is well controlled 

## 2013-06-25 NOTE — Assessment & Plan Note (Signed)
He will cont the current OTC meds as needed for pain

## 2013-06-25 NOTE — Patient Instructions (Signed)
Hypertriglyceridemia  Diet for High blood levels of Triglycerides Most fats in food are triglycerides. Triglycerides in your blood are stored as fat in your body. High levels of triglycerides in your blood may put you at a greater risk for heart disease and stroke.  Normal triglyceride levels are less than 150 mg/dL. Borderline high levels are 150-199 mg/dl. High levels are 200 - 499 mg/dL, and very high triglyceride levels are greater than 500 mg/dL. The decision to treat high triglycerides is generally based on the level. For people with borderline or high triglyceride levels, treatment includes weight loss and exercise. Drugs are recommended for people with very high triglyceride levels. Many people who need treatment for high triglyceride levels have metabolic syndrome. This syndrome is a collection of disorders that often include: insulin resistance, high blood pressure, blood clotting problems, high cholesterol and triglycerides. TESTING PROCEDURE FOR TRIGLYCERIDES  You should not eat 4 hours before getting your triglycerides measured. The normal range of triglycerides is between 10 and 250 milligrams per deciliter (mg/dl). Some people may have extreme levels (1000 or above), but your triglyceride level may be too high if it is above 150 mg/dl, depending on what other risk factors you have for heart disease.  People with high blood triglycerides may also have high blood cholesterol levels. If you have high blood cholesterol as well as high blood triglycerides, your risk for heart disease is probably greater than if you only had high triglycerides. High blood cholesterol is one of the main risk factors for heart disease. CHANGING YOUR DIET  Your weight can affect your blood triglyceride level. If you are more than 20% above your ideal body weight, you may be able to lower your blood triglycerides by losing weight. Eating less and exercising regularly is the best way to combat this. Fat provides more  calories than any other food. The best way to lose weight is to eat less fat. Only 30% of your total calories should come from fat. Less than 7% of your diet should come from saturated fat. A diet low in fat and saturated fat is the same as a diet to decrease blood cholesterol. By eating a diet lower in fat, you may lose weight, lower your blood cholesterol, and lower your blood triglyceride level.  Eating a diet low in fat, especially saturated fat, may also help you lower your blood triglyceride level. Ask your dietitian to help you figure how much fat you can eat based on the number of calories your caregiver has prescribed for you.  Exercise, in addition to helping with weight loss may also help lower triglyceride levels.   Alcohol can increase blood triglycerides. You may need to stop drinking alcoholic beverages.  Too much carbohydrate in your diet may also increase your blood triglycerides. Some complex carbohydrates are necessary in your diet. These may include bread, rice, potatoes, other starchy vegetables and cereals.  Reduce "simple" carbohydrates. These may include pure sugars, candy, honey, and jelly without losing other nutrients. If you have the kind of high blood triglycerides that is affected by the amount of carbohydrates in your diet, you will need to eat less sugar and less high-sugar foods. Your caregiver can help you with this.  Adding 2-4 grams of fish oil (EPA+ DHA) may also help lower triglycerides. Speak with your caregiver before adding any supplements to your regimen. Following the Diet  Maintain your ideal weight. Your caregivers can help you with a diet. Generally, eating less food and getting more   exercise will help you lose weight. Joining a weight control group may also help. Ask your caregivers for a good weight control group in your area.  Eat low-fat foods instead of high-fat foods. This can help you lose weight too.  These foods are lower in fat. Eat MORE of these:    Dried beans, peas, and lentils.  Egg whites.  Low-fat cottage cheese.  Fish.  Lean cuts of meat, such as round, sirloin, rump, and flank (cut extra fat off meat you fix).  Whole grain breads, cereals and pasta.  Skim and nonfat dry milk.  Low-fat yogurt.  Poultry without the skin.  Cheese made with skim or part-skim milk, such as mozzarella, parmesan, farmers', ricotta, or pot cheese. These are higher fat foods. Eat LESS of these:   Whole milk and foods made from whole milk, such as American, blue, cheddar, monterey jack, and swiss cheese  High-fat meats, such as luncheon meats, sausages, knockwurst, bratwurst, hot dogs, ribs, corned beef, ground pork, and regular ground beef.  Fried foods. Limit saturated fats in your diet. Substituting unsaturated fat for saturated fat may decrease your blood triglyceride level. You will need to read package labels to know which products contain saturated fats.  These foods are high in saturated fat. Eat LESS of these:   Fried pork skins.  Whole milk.  Skin and fat from poultry.  Palm oil.  Butter.  Shortening.  Cream cheese.  Bacon.  Margarines and baked goods made from listed oils.  Vegetable shortenings.  Chitterlings.  Fat from meats.  Coconut oil.  Palm kernel oil.  Lard.  Cream.  Sour cream.  Fatback.  Coffee whiteners and non-dairy creamers made with these oils.  Cheese made from whole milk. Use unsaturated fats (both polyunsaturated and monounsaturated) moderately. Remember, even though unsaturated fats are better than saturated fats; you still want a diet low in total fat.  These foods are high in unsaturated fat:   Canola oil.  Sunflower oil.  Mayonnaise.  Almonds.  Peanuts.  Pine nuts.  Margarines made with these oils.  Safflower oil.  Olive oil.  Avocados.  Cashews.  Peanut butter.  Sunflower seeds.  Soybean oil.  Peanut  oil.  Olives.  Pecans.  Walnuts.  Pumpkin seeds. Avoid sugar and other high-sugar foods. This will decrease carbohydrates without decreasing other nutrients. Sugar in your food goes rapidly to your blood. When there is excess sugar in your blood, your liver may use it to make more triglycerides. Sugar also contains calories without other important nutrients.  Eat LESS of these:   Sugar, brown sugar, powdered sugar, jam, jelly, preserves, honey, syrup, molasses, pies, candy, cakes, cookies, frosting, pastries, colas, soft drinks, punches, fruit drinks, and regular gelatin.  Avoid alcohol. Alcohol, even more than sugar, may increase blood triglycerides. In addition, alcohol is high in calories and low in nutrients. Ask for sparkling water, or a diet soft drink instead of an alcoholic beverage. Suggestions for planning and preparing meals   Bake, broil, grill or roast meats instead of frying.  Remove fat from meats and skin from poultry before cooking.  Add spices, herbs, lemon juice or vinegar to vegetables instead of salt, rich sauces or gravies.  Use a non-stick skillet without fat or use no-stick sprays.  Cool and refrigerate stews and broth. Then remove the hardened fat floating on the surface before serving.  Refrigerate meat drippings and skim off fat to make low-fat gravies.  Serve more fish.  Use less butter,   margarine and other high-fat spreads on bread or vegetables.  Use skim or reconstituted non-fat dry milk for cooking.  Cook with low-fat cheeses.  Substitute low-fat yogurt or cottage cheese for all or part of the sour cream in recipes for sauces, dips or congealed salads.  Use half yogurt/half mayonnaise in salad recipes.  Substitute evaporated skim milk for cream. Evaporated skim milk or reconstituted non-fat dry milk can be whipped and substituted for whipped cream in certain recipes.  Choose fresh fruits for dessert instead of high-fat foods such as pies or  cakes. Fruits are naturally low in fat. When Dining Out   Order low-fat appetizers such as fruit or vegetable juice, pasta with vegetables or tomato sauce.  Select clear, rather than cream soups.  Ask that dressings and gravies be served on the side. Then use less of them.  Order foods that are baked, broiled, poached, steamed, stir-fried, or roasted.  Ask for margarine instead of butter, and use only a small amount.  Drink sparkling water, unsweetened tea or coffee, or diet soft drinks instead of alcohol or other sweet beverages. QUESTIONS AND ANSWERS ABOUT OTHER FATS IN THE BLOOD: SATURATED FAT, TRANS FAT, AND CHOLESTEROL What is trans fat? Trans fat is a type of fat that is formed when vegetable oil is hardened through a process called hydrogenation. This process helps makes foods more solid, gives them shape, and prolongs their shelf life. Trans fats are also called hydrogenated or partially hydrogenated oils.  What do saturated fat, trans fat, and cholesterol in foods have to do with heart disease? Saturated fat, trans fat, and cholesterol in the diet all raise the level of LDL "bad" cholesterol in the blood. The higher the LDL cholesterol, the greater the risk for coronary heart disease (CHD). Saturated fat and trans fat raise LDL similarly.  What foods contain saturated fat, trans fat, and cholesterol? High amounts of saturated fat are found in animal products, such as fatty cuts of meat, chicken skin, and full-fat dairy products like butter, whole milk, cream, and cheese, and in tropical vegetable oils such as palm, palm kernel, and coconut oil. Trans fat is found in some of the same foods as saturated fat, such as vegetable shortening, some margarines (especially hard or stick margarine), crackers, cookies, baked goods, fried foods, salad dressings, and other processed foods made with partially hydrogenated vegetable oils. Small amounts of trans fat also occur naturally in some animal  products, such as milk products, beef, and lamb. Foods high in cholesterol include liver, other organ meats, egg yolks, shrimp, and full-fat dairy products. How can I use the new food label to make heart-healthy food choices? Check the Nutrition Facts panel of the food label. Choose foods lower in saturated fat, trans fat, and cholesterol. For saturated fat and cholesterol, you can also use the Percent Daily Value (%DV): 5% DV or less is low, and 20% DV or more is high. (There is no %DV for trans fat.) Use the Nutrition Facts panel to choose foods low in saturated fat and cholesterol, and if the trans fat is not listed, read the ingredients and limit products that list shortening or hydrogenated or partially hydrogenated vegetable oil, which tend to be high in trans fat. POINTS TO REMEMBER:   Discuss your risk for heart disease with your caregivers, and take steps to reduce risk factors.  Change your diet. Choose foods that are low in saturated fat, trans fat, and cholesterol.  Add exercise to your daily routine if   it is not already being done. Participate in physical activity of moderate intensity, like brisk walking, for at least 30 minutes on most, and preferably all days of the week. No time? Break the 30 minutes into three, 10-minute segments during the day.  Stop smoking. If you do smoke, contact your caregiver to discuss ways in which they can help you quit.  Do not use street drugs.  Maintain a normal weight.  Maintain a healthy blood pressure.  Keep up with your blood work for checking the fats in your blood as directed by your caregiver. Document Released: 05/31/2004 Document Revised: 02/12/2012 Document Reviewed: 12/27/2008 [employer] Patient Information 2014 [employer].  

## 2013-06-25 NOTE — Progress Notes (Signed)
Subjective:    Patient ID: Martin Singh, male    DOB: [birth year], 47 y.o.   MRN: [id number]  Back Pain This is a chronic problem. The current episode started more than 1 year ago. The problem occurs intermittently. The problem is unchanged. The pain is present in the lumbar spine. The quality of the pain is described as aching. The pain does not radiate. The pain is at a severity of 1/10. The pain is mild. The pain is worse during the day. The symptoms are aggravated by bending and position. Associated symptoms include abdominal pain (chronic, unchanged RUQ pain). Pertinent negatives include no bladder incontinence, bowel incontinence, chest pain, dysuria, fever, headaches, leg pain, numbness, paresis, paresthesias, pelvic pain, perianal numbness, tingling, weakness or weight loss. Risk factors include obesity and lack of exercise. He has tried analgesics and NSAIDs for the symptoms. The treatment provided moderate relief.      Review of Systems  Constitutional: Positive for unexpected weight change (wt gain). Negative for fever, chills, weight loss, diaphoresis, activity change, appetite change and fatigue.  HENT: Negative.   Eyes: Negative.   Respiratory: Negative.  Negative for cough, choking, chest tightness, shortness of breath, wheezing and stridor.   Cardiovascular: Negative.  Negative for chest pain, palpitations and leg swelling.  Gastrointestinal: Positive for abdominal pain (chronic, unchanged RUQ pain). Negative for nausea, diarrhea, constipation, blood in stool, abdominal distention, anal bleeding, rectal pain and bowel incontinence.  Endocrine: Negative.   Genitourinary: Negative.  Negative for bladder incontinence, dysuria and pelvic pain.  Musculoskeletal: Positive for back pain. Negative for arthralgias, gait problem, joint swelling, myalgias, neck pain and neck stiffness.  Skin: Negative.   Allergic/Immunologic: Negative.   Neurological: Negative for dizziness, tingling,  speech difficulty, weakness, light-headedness, numbness, headaches and paresthesias.  Hematological: Negative.  Negative for adenopathy. Does not bruise/bleed easily.  Psychiatric/Behavioral: Negative.        Objective:   Physical Exam  Vitals reviewed. Constitutional: He appears well-developed and well-nourished. No distress.  HENT:  Head: Normocephalic and atraumatic.  Mouth/Throat: Oropharynx is clear and moist. No oropharyngeal exudate.  Eyes: Conjunctivae are normal. Right eye exhibits no discharge. Left eye exhibits no discharge. No scleral icterus.  Neck: Normal range of motion. Neck supple. No JVD present. No tracheal deviation present. No thyromegaly present.  Cardiovascular: Normal rate, regular rhythm, normal heart sounds and intact distal pulses.  Exam reveals no gallop and no friction rub.   No murmur heard. Pulmonary/Chest: Effort normal and breath sounds normal. No stridor. No respiratory distress. He has no wheezes. He has no rales. He exhibits no tenderness.  Abdominal: Soft. Bowel sounds are normal. He exhibits no distension and no mass. There is no tenderness. There is no rebound and no guarding.  Musculoskeletal:       Lumbar back: Normal. He exhibits normal range of motion, no tenderness, no bony tenderness, no swelling, no edema, no deformity, no laceration, no pain, no spasm and normal pulse.  Lymphadenopathy:    He has no cervical adenopathy.  Neurological: He is alert. He has normal strength. He displays no atrophy and no tremor. No cranial nerve deficit or sensory deficit. He exhibits normal muscle tone. He displays a negative Romberg sign. He displays no seizure activity. Coordination and gait normal.  Reflex Scores:      Tricep reflexes are 1+ on the right side and 1+ on the left side.      Bicep reflexes are 1+ on the right side and 1+ on the  left side.      Brachioradialis reflexes are 1+ on the right side and 1+ on the left side.      Patellar reflexes are  1+ on the right side and 1+ on the left side.      Achilles reflexes are 1+ on the right side and 1+ on the left side. Neg SLR in BLE  Skin: He is not diaphoretic.     Lab Results  Component Value Date   WBC 3.9* 05/08/2013   HGB 12.3* 05/08/2013   HCT 35.9* 05/08/2013   PLT 258 05/08/2013   GLUCOSE 99 05/08/2013   CHOL 191 05/07/2013   TRIG 333.0* 05/07/2013   HDL 54.20 05/07/2013   LDLDIRECT 104.5 05/07/2013   LDLCALC  Value: 34        Total Cholesterol/HDL:CHD Risk Coronary Heart Disease Risk Table                     Men   Women  1/2 Average Risk   3.4   3.3  Average Risk       5.0   4.4  2 X Average Risk   9.6   7.1  3 X Average Risk  23.4   11.0        Use the calculated Patient Ratio above and the CHD Risk Table to determine the patient's CHD Risk.        ATP III CLASSIFICATION (LDL):  <100     mg/dL   Optimal  960-454  mg/dL   Near or Above                    Optimal  130-159  mg/dL   Borderline  098-119  mg/dL   High  >147     mg/dL   Very High 04/24/5620   ALT 19 05/08/2013   AST 24 05/08/2013   NA 141 05/08/2013   K 4.0 05/08/2013   CL 102 05/08/2013   CREATININE 1.21 05/08/2013   BUN 26* 05/08/2013   CO2 27 05/08/2013   TSH 2.09 03/10/2013   INR 1.05 05/07/2010   HGBA1C 5.9 04/02/2013       Assessment & Plan:

## 2013-07-01 ENCOUNTER — Ambulatory Visit: Payer: Self-pay | Admitting: Internal Medicine

## 2013-07-22 ENCOUNTER — Telehealth: Payer: Self-pay | Admitting: *Deleted

## 2013-07-22 ENCOUNTER — Other Ambulatory Visit: Payer: Self-pay | Admitting: *Deleted

## 2013-07-22 ENCOUNTER — Other Ambulatory Visit: Payer: Self-pay

## 2013-07-22 DIAGNOSIS — R569 Unspecified convulsions: Secondary | ICD-10-CM

## 2013-07-22 DIAGNOSIS — E785 Hyperlipidemia, unspecified: Secondary | ICD-10-CM

## 2013-07-22 DIAGNOSIS — F332 Major depressive disorder, recurrent severe without psychotic features: Secondary | ICD-10-CM

## 2013-07-22 DIAGNOSIS — I129 Hypertensive chronic kidney disease with stage 1 through stage 4 chronic kidney disease, or unspecified chronic kidney disease: Secondary | ICD-10-CM

## 2013-07-22 MED ORDER — CARBAMAZEPINE ER 200 MG PO TB12: 400.0000 mg | ORAL_TABLET | Freq: Two times a day (BID) | ORAL | Status: DC

## 2013-07-22 MED ORDER — CARVEDILOL 12.5 MG PO TABS: 12.5000 mg | ORAL_TABLET | Freq: Two times a day (BID) | ORAL | Status: DC

## 2013-07-22 MED ORDER — CHOLINE FENOFIBRATE 135 MG PO CPDR: 135.0000 mg | DELAYED_RELEASE_CAPSULE | Freq: Every day | ORAL | Status: DC

## 2013-07-22 NOTE — Telephone Encounter (Signed)
Rx faxed as requested, pt informed

## 2013-07-22 NOTE — Telephone Encounter (Signed)
Denny Peon from [facility] sent a fax stating Carbamazepine 200mg  not available please change to Tegretol XR; Gemfibrozil 600mg  not available please change to Tri Cor or Trilipix; Atenolol not available please change to Carvedilol.  Please advise

## 2013-07-22 NOTE — Telephone Encounter (Signed)
Changes were made and were sent to his pharmacy

## 2013-07-27 ENCOUNTER — Encounter: Payer: Self-pay | Admitting: Neurology

## 2013-07-30 ENCOUNTER — Telehealth: Payer: Self-pay | Admitting: Internal Medicine

## 2013-07-30 NOTE — Telephone Encounter (Signed)
Martin Singh and I met with Martin Singh and his caretaker.  Advised to contact medical records with request for change to his medical history.  Patient requests transfer to another provider.  Advised to research and select provider of his choice.  Transfer to another provider okay per Dr. Yetta Barre.

## 2013-07-31 ENCOUNTER — Telehealth: Payer: Self-pay | Admitting: Internal Medicine

## 2013-07-31 NOTE — Telephone Encounter (Addendum)
Request for Amendment received at [facility] 07/30/13  DAJ  Denial of amendment letter mailed to patient August 06, 2013 [facility]  Received signed domestic return receipt verifying delivery of Denial of amendment letter on August 10, 2013  Article number [id number] DAJ

## 2013-09-01 ENCOUNTER — Ambulatory Visit (INDEPENDENT_AMBULATORY_CARE_PROVIDER_SITE_OTHER): Payer: Self-pay | Admitting: Urology

## 2013-09-01 DIAGNOSIS — R350 Frequency of micturition: Secondary | ICD-10-CM

## 2013-09-01 DIAGNOSIS — N3944 Nocturnal enuresis: Secondary | ICD-10-CM

## 2013-09-01 DIAGNOSIS — M545 Low back pain, unspecified: Secondary | ICD-10-CM

## 2013-09-01 DIAGNOSIS — F5232 Male orgasmic disorder: Secondary | ICD-10-CM

## 2013-09-03 ENCOUNTER — Ambulatory Visit: Payer: Self-pay | Admitting: Neurology

## 2013-09-04 ENCOUNTER — Other Ambulatory Visit: Payer: Self-pay | Admitting: Neurology

## 2013-09-04 MED ORDER — ATENOLOL 100 MG PO TABS: 100.0000 mg | ORAL_TABLET | Freq: Every day | ORAL | Status: DC

## 2013-09-07 ENCOUNTER — Ambulatory Visit (INDEPENDENT_AMBULATORY_CARE_PROVIDER_SITE_OTHER): Payer: Self-pay | Admitting: Neurology

## 2013-09-07 ENCOUNTER — Encounter: Payer: Self-pay | Admitting: Neurology

## 2013-09-07 VITALS — BP 118/70 | HR 68 | Temp 98.6°F | Ht 61.0 in | Wt 190.0 lb

## 2013-09-07 DIAGNOSIS — M542 Cervicalgia: Secondary | ICD-10-CM

## 2013-09-07 DIAGNOSIS — G44229 Chronic tension-type headache, not intractable: Secondary | ICD-10-CM

## 2013-09-07 DIAGNOSIS — R569 Unspecified convulsions: Secondary | ICD-10-CM

## 2013-09-07 DIAGNOSIS — R51 Headache: Secondary | ICD-10-CM

## 2013-09-07 DIAGNOSIS — R519 Headache, unspecified: Secondary | ICD-10-CM

## 2013-09-07 NOTE — Patient Instructions (Addendum)
I really don't think the shaking spells are seizures.  They may be due to anxiety. 1.  We will [facility] at [facility] for monitoring. They will call you to schedule an appointment.   [phone] 2.  Continue trileptal  For headaches 1.  Continue atenolol 2.  Refer to physical therapy again. They will call you to schedule an appointment.  Follow up after [facility].

## 2013-09-07 NOTE — Progress Notes (Signed)
NEUROLOGY FOLLOW UP OFFICE NOTE  Martin Singh [id number]  HISTORY OF PRESENT ILLNESS: Martin Singh is a 48 year old man who is following up for chronic daily headaches and seizure-like episodes.  Records and images were personally reviewed where available.    SEIZURE-LIKE EPISODES: Onset of seizures started about 1 year ago, around the time he quit alcohol and lost custody of his son.  It started around the time he quit alcohol.  He will suddenly feel his whole body stiffen and shake.  He does not lose awareness.  It lasts for a couple of minutes.  No postictal state or preceding aura.  No urinary incontinence.  No tongue biting.  Will sometimes note urinary urgency but usually able to hold it in.  Stress and panic attacks trigger them.  He also has spells that occur while asleep, as witnessed by his caretaker.  In his sleep, he would stiffen with flexed posturing of his arms and start shaking for a couple of minutes.  Sometimes he would awake but other times he would remain asleep.  In the morning, he sometimes he has wet the bed.  Spells occur about 2 times a week but can fluctuate, sometimes goes a couple of weeks without episode.  He takes Tegretol 200/200/400.  Tegretol level from 07/22/13 was 11.7.  Since last visit, he had about two daytime spells.  A month ago, he had one of these spells while carrying the trash down the stairs.  He hit his head and back.  He says his fiance still tells him he has spells while asleep.  04/13/13 MRI Brain w/wo:  mild small vessel changes, but nothing remarkable. 04/21/13 EEG revealed mild generalized background slowing, likely pharmacologic effect.  CHRONIC TENSION HEADACHES: History of migraines but has been daily for the past 2 to 3 months.  It's a nonthrobbing pressure-like pain in a band-like distribution, radiating into his neck, about 7/10.  It is associated with nausea, but not photophobia or phonophobia.  Resting and ice packs help relieve.   Activity makes it worse.  Advil, Tylenol and Excedrin are not effective.  He was taking them daily up until 2 weeks ago.  He does have neck pain.  He was initially on nortriptyline, which was discontinued after it was thought to have contributed to suicidal ideation.  He was titrated up on atenolol to 100mg  daily.  He went to PT for the neck, which helped.   Since last visit, the headaches have become daily since the fall and hitting his head.  He still tries to do the neck exercises that he learned last time at PT, but the headaches persist.  He rarely uses ibuprofen.  Currently in process of finding a new PCP.  Receives psychiatric care from Lauris Poag, NP and Sabas Sous at [facility].  PAST MEDICAL HISTORY: Past Medical History  Diagnosis Date  . Hypertension   . Depressive disorder, not elsewhere classified   . Osteoarthrosis, unspecified whether generalized or localized, unspecified site   . Irritable bowel syndrome   . Esophageal reflux   . Allergic rhinitis   . History of pneumonia   . Insomnia   . Anxiety   . Diverticulosis   . Alcohol abuse 2012  . Benzodiazepine abuse   . Seizures   . Alcoholic pancreatitis   . Erosive esophagitis   . Periumbilical hernia 12/13/12  . Abnormal LFTs   . Fatty liver 10/08/11  . Internal hemorrhoids 03/13/11  .  Hiatal hernia 03/13/11  . Asthmatic bronchitis   . Astigmatism   . Frequent headaches     MEDICATIONS: Current Outpatient Prescriptions on File Prior to Visit  Medication Sig Dispense Refill  . albuterol (PROVENTIL HFA;VENTOLIN HFA) 108 (90 BASE) MCG/ACT inhaler Inhale 2 puffs into the lungs every 6 (six) hours as needed for wheezing or shortness of breath.       Marland Kitchen aspirin 81 MG chewable tablet Chew 81 mg by mouth daily.      . carbamazepine (TEGRETOL-XR) 200 MG 12 hr tablet Take 2 tablets (400 mg total) by mouth 2 (two) times daily.  120 tablet  5  . carvedilol (COREG) 12.5 MG tablet Take 1 tablet (12.5 mg  total) by mouth 2 (two) times daily with a meal.  180 tablet  1  . cloNIDine (CATAPRES) 0.1 MG tablet Take 1 tablet (0.1 mg total) by mouth 2 (two) times daily.  60 tablet  5  . cyanocobalamin 2000 MCG tablet Take 1 tablet (2,000 mcg total) by mouth daily.  30 tablet  11  . esomeprazole (NEXIUM) 40 MG capsule Take 1 capsule (40 mg total) by mouth daily before breakfast.  90 capsule  1  . FLUoxetine (PROZAC) 20 MG capsule Take 3 capsules (60 mg total) by mouth daily.  90 capsule  1  . gabapentin (NEURONTIN) 300 MG capsule Take 1 capsule (300 mg total) by mouth 2 (two) times daily.  60 capsule  11  . hydrochlorothiazide (HYDRODIURIL) 25 MG tablet Take 1 tablet (25 mg total) by mouth daily.  30 tablet  0  . omeprazole (PRILOSEC) 40 MG capsule Take 1 capsule (40 mg total) by mouth daily.  90 capsule  1  . ranitidine (ZANTAC) 300 MG tablet Take 1 tablet (300 mg total) by mouth at bedtime.  90 tablet  1  . atenolol (TENORMIN) 100 MG tablet Take 1 tablet (100 mg total) by mouth daily.  30 tablet  0  . Choline Fenofibrate (TRILIPIX) 135 MG capsule Take 1 capsule (135 mg total) by mouth daily.  90 capsule  1   No current facility-administered medications on file prior to visit.    ALLERGIES: Allergies  Allergen Reactions  . Codeine Anaphylaxis  . Benazepril Cough  . Ativan [Lorazepam] Other (See Comments)    Becomes violent and hallucinates  . Corticosteroids Other (See Comments)    Shaky and high blood pressure    FAMILY HISTORY: Family History  Problem Relation Age of Onset  . Heart disease Mother   . Gallbladder disease Mother   . Nephrolithiasis Mother   . Arthritis Mother   . Hyperlipidemia Mother   . Stroke Mother   . Irritable bowel syndrome Mother   . Cancer Mother     vulva  . Colon cancer Neg Hx   . Hypertension Father     moth  . Heart disease Father   . Arthritis Father   . Irritable bowel syndrome Brother     x 2    SOCIAL HISTORY: History   Social History  .  Marital Status: Divorced    Spouse Name: N/A    Number of Children: 1  . Years of Education: N/A   Occupational History  . unemployed    Social History Main Topics  . Smoking status: Former Smoker -- 1.50 packs/day for 18 years    Types: Cigarettes    Quit date: 08/28/1991  . Smokeless tobacco: Current User    Types: Chew  Comment: Tobacco info given 03/31/13  . Alcohol Use: No     Comment: no alcohol in one yr; drank 35 years alcohol, quit 02/2012  . Drug Use: No     Comment: benzos- last used 1 month ago; hx of THC, cocaine, uppers/downers  . Sexual Activity: Yes     Comment: no birth control   Other Topics Concern  . Not on file   Social History Narrative  . No narrative on file    REVIEW OF SYSTEMS: Constitutional: No fevers, chills, or sweats, no generalized fatigue, change in appetite Eyes: No visual changes, double vision, eye pain Ear, nose and throat: No hearing loss, ear pain, nasal congestion, sore throat Cardiovascular: No chest pain, palpitations Respiratory:  No shortness of breath at rest or with exertion, wheezes GastrointestinaI: No nausea, vomiting, diarrhea, abdominal pain, fecal incontinence Genitourinary:  No dysuria, urinary retention or frequency Musculoskeletal:  No neck pain, back pain Integumentary: No rash, pruritus, skin lesions Neurological: as above Psychiatric: No depression, insomnia, anxiety Endocrine: No palpitations, fatigue, diaphoresis, mood swings, change in appetite, change in weight, increased thirst Hematologic/Lymphatic:  No anemia, purpura, petechiae. Allergic/Immunologic: no itchy/runny eyes, nasal congestion, recent allergic reactions, rashes  PHYSICAL EXAM: Filed Vitals:   09/07/13 0955  BP: 118/70  Pulse: 68  Temp: 98.6 F (37 C)   General: No acute distress Head:  Normocephalic/atraumatic Neck: supple, no paraspinal tenderness, full range of motion Heart:  Regular rate and rhythm Lungs:  Clear to auscultation  bilaterally Back: No paraspinal tenderness Neurological Exam: alert and oriented to person, place, and time. Speech fluent and not dysarthric, language intact.  CN II-XII intact. Bulk and tone normal, muscle strength 5/5 throughout.  Sensation to light touch intact.  Finger to nose and heel to shin testing intact.  Gait normal, Romberg negative.  IMPRESSION: 1.  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 2.  Chronic daily tension headaches.  PLAN: 1.  Will [facility] to [facility] for monitoring to try and verify non-epileptic. 2.  Continue trileptal and atenolol for now. 3.  Refer again to PT for post-traumatic chronic daily headaches. 4.  Follow up after EMU evaluation.  Shon MilletAdam Angelee Bahr, DO  CC:  Lauris PoagLeslie O Neal, NP  Sabas SousAudrey Croston

## 2013-09-09 ENCOUNTER — Ambulatory Visit: Payer: Self-pay | Admitting: Physical Therapy

## 2013-09-15 ENCOUNTER — Telehealth: Payer: Self-pay | Admitting: Neurology

## 2013-09-15 NOTE — Telephone Encounter (Signed)
Martin Singh w/ Epilepsy [facility] at [facility], CB# [phone]. Re: referral sent 09/08/13, for EMU admission. Pt with no insurance, this unit does not accept charity care. Patient is unable to make payments. / Martin KrasSherri S.

## 2013-09-15 NOTE — Telephone Encounter (Signed)
I would contact him to set up another follow up to discuss this further.

## 2013-09-15 NOTE — Telephone Encounter (Signed)
Please advise what  I need to do to assist this patient

## 2013-09-25 ENCOUNTER — Ambulatory Visit: Payer: Self-pay | Admitting: Internal Medicine

## 2013-09-28 ENCOUNTER — Telehealth: Payer: Self-pay | Admitting: *Deleted

## 2013-09-28 NOTE — Telephone Encounter (Signed)
RX faxed to [facility] [phone] [place]  For  Tenormin 100mg  #30 0 refills

## 2013-09-29 ENCOUNTER — Institutional Professional Consult (permissible substitution): Payer: Self-pay | Admitting: Internal Medicine

## 2013-10-02 ENCOUNTER — Encounter: Payer: Self-pay | Admitting: Internal Medicine

## 2013-10-18 ENCOUNTER — Encounter (HOSPITAL_COMMUNITY): Payer: Self-pay | Admitting: Emergency Medicine

## 2013-10-18 ENCOUNTER — Emergency Department (HOSPITAL_COMMUNITY)
Admission: EM | Admit: 2013-10-18 | Discharge: 2013-10-18 | Disposition: A | Discharge status: Home / Self Care | Payer: Self-pay | Attending: Emergency Medicine | Admitting: Emergency Medicine

## 2013-10-18 ENCOUNTER — Emergency Department (HOSPITAL_COMMUNITY): Payer: Self-pay

## 2013-10-18 DIAGNOSIS — I10 Essential (primary) hypertension: Secondary | ICD-10-CM | POA: Insufficient documentation

## 2013-10-18 DIAGNOSIS — M549 Dorsalgia, unspecified: Secondary | ICD-10-CM | POA: Insufficient documentation

## 2013-10-18 DIAGNOSIS — F329 Major depressive disorder, single episode, unspecified: Secondary | ICD-10-CM | POA: Insufficient documentation

## 2013-10-18 DIAGNOSIS — Z7982 Long term (current) use of aspirin: Secondary | ICD-10-CM | POA: Insufficient documentation

## 2013-10-18 DIAGNOSIS — R0602 Shortness of breath: Secondary | ICD-10-CM | POA: Insufficient documentation

## 2013-10-18 DIAGNOSIS — R609 Edema, unspecified: Secondary | ICD-10-CM | POA: Insufficient documentation

## 2013-10-18 DIAGNOSIS — G40909 Epilepsy, unspecified, not intractable, without status epilepticus: Secondary | ICD-10-CM | POA: Insufficient documentation

## 2013-10-18 DIAGNOSIS — F411 Generalized anxiety disorder: Secondary | ICD-10-CM | POA: Insufficient documentation

## 2013-10-18 DIAGNOSIS — G8929 Other chronic pain: Secondary | ICD-10-CM | POA: Insufficient documentation

## 2013-10-18 DIAGNOSIS — R142 Eructation: Secondary | ICD-10-CM | POA: Insufficient documentation

## 2013-10-18 DIAGNOSIS — J45909 Unspecified asthma, uncomplicated: Secondary | ICD-10-CM | POA: Insufficient documentation

## 2013-10-18 DIAGNOSIS — Z87891 Personal history of nicotine dependence: Secondary | ICD-10-CM | POA: Insufficient documentation

## 2013-10-18 DIAGNOSIS — M79609 Pain in unspecified limb: Secondary | ICD-10-CM | POA: Insufficient documentation

## 2013-10-18 DIAGNOSIS — Z8701 Personal history of pneumonia (recurrent): Secondary | ICD-10-CM | POA: Insufficient documentation

## 2013-10-18 DIAGNOSIS — F3289 Other specified depressive episodes: Secondary | ICD-10-CM | POA: Insufficient documentation

## 2013-10-18 DIAGNOSIS — M1990 Unspecified osteoarthritis, unspecified site: Secondary | ICD-10-CM | POA: Insufficient documentation

## 2013-10-18 DIAGNOSIS — R141 Gas pain: Secondary | ICD-10-CM | POA: Insufficient documentation

## 2013-10-18 DIAGNOSIS — G4700 Insomnia, unspecified: Secondary | ICD-10-CM | POA: Insufficient documentation

## 2013-10-18 DIAGNOSIS — R143 Flatulence: Secondary | ICD-10-CM

## 2013-10-18 DIAGNOSIS — R079 Chest pain, unspecified: Secondary | ICD-10-CM | POA: Insufficient documentation

## 2013-10-18 DIAGNOSIS — K219 Gastro-esophageal reflux disease without esophagitis: Secondary | ICD-10-CM | POA: Insufficient documentation

## 2013-10-18 DIAGNOSIS — Z79899 Other long term (current) drug therapy: Secondary | ICD-10-CM | POA: Insufficient documentation

## 2013-10-18 LAB — URINALYSIS, ROUTINE W REFLEX MICROSCOPIC
Bilirubin Urine: NEGATIVE
Glucose, UA: NEGATIVE mg/dL
Hgb urine dipstick: NEGATIVE
KETONES UR: NEGATIVE mg/dL
Leukocytes, UA: NEGATIVE
NITRITE: NEGATIVE
PH: 7 (ref 5.0–8.0)
Protein, ur: NEGATIVE mg/dL
Specific Gravity, Urine: 1.011 (ref 1.005–1.030)
UROBILINOGEN UA: 0.2 mg/dL (ref 0.0–1.0)

## 2013-10-18 LAB — BASIC METABOLIC PANEL
BUN: 26 mg/dL — ABNORMAL HIGH (ref 6–23)
CALCIUM: 9 mg/dL (ref 8.4–10.5)
CO2: 27 mEq/L (ref 19–32)
Chloride: 102 mEq/L (ref 96–112)
Creatinine, Ser: 1.47 mg/dL — ABNORMAL HIGH (ref 0.50–1.35)
GFR calc Af Amer: 63 mL/min — ABNORMAL LOW (ref >90)
GFR calc non Af Amer: 55 mL/min — ABNORMAL LOW (ref >90)
Glucose, Bld: 100 mg/dL — ABNORMAL HIGH (ref 70–99)
Potassium: 4.1 mEq/L (ref 3.7–5.3)
SODIUM: 141 meq/L (ref 137–147)

## 2013-10-18 LAB — D-DIMER, QUANTITATIVE (NOT AT ARMC): D DIMER QUANT: 0.37 ug{FEU}/mL (ref 0.00–0.48)

## 2013-10-18 LAB — CBC
HCT: 35.1 % — ABNORMAL LOW (ref 39.0–52.0)
Hemoglobin: 11.7 g/dL — ABNORMAL LOW (ref 13.0–17.0)
MCH: 30.1 pg (ref 26.0–34.0)
MCHC: 33.3 g/dL (ref 30.0–36.0)
MCV: 90.2 fL (ref 78.0–100.0)
PLATELETS: 282 10*3/uL (ref 150–400)
RBC: 3.89 MIL/uL — AB (ref 4.22–5.81)
RDW: 13.5 % (ref 11.5–15.5)
WBC: 3.9 10*3/uL — AB (ref 4.0–10.5)

## 2013-10-18 LAB — HEPATIC FUNCTION PANEL
ALT: 21 U/L (ref 0–53)
AST: 29 U/L (ref 0–37)
Albumin: 4.1 g/dL (ref 3.5–5.2)
Alkaline Phosphatase: 47 U/L (ref 39–117)
Total Bilirubin: <0.2 mg/dL — ABNORMAL LOW (ref 0.3–1.2)
Total Protein: 7 g/dL (ref 6.0–8.3)

## 2013-10-18 LAB — PRO B NATRIURETIC PEPTIDE: Pro B Natriuretic peptide (BNP): 230.4 pg/mL — ABNORMAL HIGH (ref 0–125)

## 2013-10-18 LAB — TROPONIN I: Troponin I: <0.3 ng/mL (ref <0.30)

## 2013-10-18 MED ORDER — KETOROLAC TROMETHAMINE 30 MG/ML IJ SOLN
30.0000 mg | Freq: Once | INTRAMUSCULAR | Status: AC
  Administered 2013-10-18: 30 mg via INTRAVENOUS
  Filled 2013-10-18: qty 2

## 2013-10-18 MED ORDER — NAPROXEN 500 MG PO TABS: 500.0000 mg | ORAL_TABLET | Freq: Two times a day (BID) | ORAL | Status: DC

## 2013-10-18 NOTE — ED Notes (Addendum)
Pt reports lower back pain and bil leg swelling with pitting edema. Leg edema x1 month after riding in bus, leg swelling increased after pt flew in airplane on 2/18. Reports intermittent SOB. Able to speak in full sentences

## 2013-10-18 NOTE — Discharge Instructions (Signed)
RESOURCE GUIDE  Chronic Pain Problems: Contact [facility]  [phone] Patients need to be referred by their primary care doctor.  Insufficient Money for Medicine: Contact [employer]:  call "211."   No Primary Care Doctor: - Call [facility]  [phone] - can help you locate a primary care doctor that  accepts your insurance, provides certain services, etc. - Physician Referral Service- [phone]  Agencies that provide inexpensive medical care: - [facility]  [phone] - [facility]  [phone] - [facility]  [phone] - [facility]  [phone] - [facility]  [phone] - [facility]  [phone]  [place] Providers: - [facility]- [address]  [phone], Mon-Fri 9am-7pm, Sat 9am-1pm - Quincy Lucas, [place], [address] [place]  [phone] [facility]- [address]  [place], [address], [phone]  Only accepts [place] patients after they have their name  applied to their card  Self Pay (no insurance) in [place]: - Sickle Cell Patients: Dr Kevan Ny, [facility]  [place], [facility]- [place]  [facility]- [address], [facility]- see information above (Speak to [employer] if you do not have insurance)       -  [facility]- [place],  Ethel Crafton, [place]  Dr Vista Lawman-  [address], [place], [place], [place]       -  [facility] - [address], [phone]       -  [facility]- [address], [place],  also [address], [phone]       -    [facility]- [address], [place], 1st & 3rd Saturday        every month, 10am-1pm  1) Find a Doctor and Pay Out of Pocket Although you won't have to find out who is covered by your insurance plan, it is a good idea to ask around and get recommendations. You will then need to call the office and see if the doctor you have chosen will accept you as a new patient and what types of options they offer for patients who are self-pay. Some doctors offer discounts or will set up payment plans for their patients who do not have insurance, but you will need to ask so you aren't surprised when you get to your appointment.  2) Contact Your Local Health Department Not all health departments have doctors that can see patients for sick visits, but many do, so it is worth a call to see if yours does. If you don't know where your local health department is, you can check in your phone book. The CDC also has a tool to help you locate your state's health department, and many state websites also have listings of all of their local health departments.  3) Find a [employer]  If your illness is not likely to be very severe or complicated, you may want to try a walk in clinic. These are popping up all over the country in pharmacies, drugstores, and shopping centers. They're usually staffed by nurse practitioners or physician assistants that have been trained to treat common illnesses and complaints. They're usually fairly quick and inexpensive. However, if you have serious medical issues or chronic medical problems, these are probably not your best option  Chronic Pain Chronic pain can be defined as pain that is off and on and lasts for 3 6 months or longer. Many things cause chronic pain, which can make it difficult to make a diagnosis. There are many treatment options available for chronic pain. However, finding a treatment that works well for  you may require trying various approaches until the right one is found. Many people benefit from a combination of two or more types of treatment to control their pain. SYMPTOMS  Chronic pain can occur anywhere in the body and can range from mild to very severe. Some types of chronic pain include:  Headache.  Low back pain.  Cancer pain.  Arthritis pain.  Neurogenic pain. This is pain resulting from damage to nerves. People with chronic pain may also have other symptoms such as:  Depression.  Anger.  Insomnia.  Anxiety. DIAGNOSIS  Your health care provider will help diagnose your condition over time. In many cases, the initial focus will be on excluding possible conditions that could be causing the pain. Depending on your symptoms, your health care provider may order tests to diagnose your condition. Some of these tests may include:   Blood tests.   CT scan.   MRI.   X-rays.   Ultrasounds.   Nerve conduction studies.  You may need to see a specialist.  TREATMENT  Finding treatment that works well may take time. You may be referred to a pain specialist. He or she may prescribe medicine or therapies, such as:   Mindful meditation or yoga.  Shots (injections) of numbing or pain-relieving medicines into the spine or area of pain.  Local electrical stimulation.  Acupuncture.   Massage therapy.   Aroma, color, light, or sound therapy.   Biofeedback.   Working with a physical therapist to keep from getting stiff.   Regular, gentle exercise.   Cognitive or behavioral therapy.   Group support.  Sometimes, surgery may be recommended.  HOME CARE INSTRUCTIONS   Take all medicines as directed by your health care provider.   Lessen stress in your life by relaxing and doing things such as listening to calming music.   Exercise or be active as directed by your health care provider.   Eat a healthy diet and include things such as vegetables,  fruits, fish, and lean meats in your diet.   Keep all follow-up appointments with your health care provider.   Attend a support group with others suffering from chronic pain. SEEK MEDICAL CARE IF:   Your pain gets worse.   You develop a new pain that was not there before.   You cannot tolerate medicines given to you by your health care provider.   You have new symptoms since your last visit with your health care provider.  SEEK IMMEDIATE MEDICAL CARE IF:   You feel weak.   You have decreased sensation or numbness.   You lose control of bowel or bladder function.   Your pain suddenly gets much worse.   You develop shaking.  You  develop chills.  You develop confusion.  You develop chest pain.  You develop shortness of breath.  MAKE SURE YOU:  Understand these instructions.  Will watch your condition.  Will get help right away if you are not doing well or get worse. Document Released: 05/05/2002 Document Revised: 04/15/2013 Document Reviewed: 02/06/2013 [facility] Patient Information 2014 [place], [place].  Edema Edema is an abnormal build-up of fluids in tissues. Because this is partly dependent on gravity (water flows to the lowest place), it is more common in the legs and thighs (lower extremities). It is also common in the looser tissues, like around the eyes. Painless swelling of the feet and ankles is common and increases as a person ages. It may affect both legs and may include the calves or even thighs. When squeezed, the fluid may move out of the affected area and may leave a dent for a few moments. CAUSES   Prolonged standing or sitting in one place for extended periods of time. Movement helps pump tissue fluid into the veins, and absence of movement prevents this, resulting in edema.  Varicose veins. The valves in the veins do not work as well as they should. This causes fluid to leak into the tissues.  Fluid and salt overload.  Injury, burn, or  surgery to the leg, ankle, or foot, may damage veins and allow fluid to leak out.  Sunburn damages vessels. Leaky vessels allow fluid to go out into the sunburned tissues.  Allergies (from insect bites or stings, medications or chemicals) cause swelling by allowing vessels to become leaky.  Protein in the blood helps keep fluid in your vessels. Low protein, as in malnutrition, allows fluid to leak out.  Hormonal changes, including pregnancy and menstruation, cause fluid retention. This fluid may leak out of vessels and cause edema.  Medications that cause fluid retention. Examples are sex hormones, blood pressure medications, steroid treatment, or anti-depressants.  Some illnesses cause edema, especially heart failure, kidney disease, or liver disease.  Surgery that cuts veins or lymph nodes, such as surgery done for the heart or for breast cancer, may result in edema. DIAGNOSIS  Your caregiver is usually easily able to determine what is causing your swelling (edema) by simply asking what is wrong (getting a history) and examining you (doing a physical). Sometimes x-rays, EKG (electrocardiogram or heart tracing), and blood work may be done to evaluate for underlying medical illness. TREATMENT  General treatment includes:  Leg elevation (or elevation of the affected body part).  Restriction of fluid intake.  Prevention of fluid overload.  Compression of the affected body part. Compression with elastic bandages or support stockings squeezes the tissues, preventing fluid from entering and forcing it back into the blood vessels.  Diuretics (also called water pills or fluid pills) pull fluid out of your body in the form of increased urination. These are effective in reducing the swelling, but can have side effects and must be used only under your caregiver's supervision. Diuretics are appropriate only for some types of edema. The specific treatment can be directed at any underlying causes  discovered. Heart, liver, or kidney disease should be treated appropriately. HOME CARE INSTRUCTIONS   Elevate the legs (or affected body part) above the level of the heart, while lying down.  Avoid sitting or standing still for prolonged periods of time.  Avoid putting anything directly under the knees when lying down, and do not wear constricting clothing or garters on the upper legs.  Exercising the legs causes  the fluid to work back into the veins and lymphatic channels. This may help the swelling go down.  The pressure applied by elastic bandages or support stockings can help reduce ankle swelling.  A low-salt diet may help reduce fluid retention and decrease the ankle swelling.  Take any medications exactly as prescribed. SEEK MEDICAL CARE IF:  Your edema is not responding to recommended treatments. SEEK IMMEDIATE MEDICAL CARE IF:   You develop shortness of breath or chest pain.  You cannot breathe when you lay down; or if, while lying down, you have to get up and go to the window to get your breath.  You are having increasing swelling without relief from treatment.  You develop a fever over 102 F (38.9 C).  You develop pain or redness in the areas that are swollen.  Tell your caregiver right away if you have gained 03 lb/1.4 kg in 1 day or 05 lb/2.3 kg in a week. MAKE SURE YOU:   Understand these instructions.  Will watch your condition.  Will get help right away if you are not doing well or get worse. Document Released: 08/13/2005 Document Revised: 02/12/2012 Document Reviewed: 03/31/2008 [facility] Patient Information 2014 [place], [place].  Shortness of Breath Shortness of breath means you have trouble breathing. Shortness of breath may indicate that you have a medical problem. You should seek immediate medical care for shortness of breath. CAUSES   Not enough oxygen in the air (as with high altitudes or a smoke-filled room).  Short-term (acute) lung disease,  including:  Infections, such as pneumonia.  Fluid in the lungs, such as heart failure.  A blood clot in the lungs (pulmonary embolism).  Long-term (chronic) lung diseases.  Heart disease (heart attack, angina, heart failure, and others).  Low red blood cells (anemia).  Poor physical fitness. This can cause shortness of breath when you exercise.  Chest or back injuries or stiffness.  Being overweight.  Smoking.  Anxiety. This can make you feel like you are not getting enough air. DIAGNOSIS  Serious medical problems can usually be found during your physical exam. Tests may also be done to determine why you are having shortness of breath. Tests may include:  Chest X-rays.  Lung function tests.  Blood tests.  Electrocardiography.  Exercise testing.  Echocardiography.  Imaging scans. Your caregiver may not be able to find a cause for your shortness of breath after your exam. In this case, it is important to have a follow-up exam with your caregiver as directed.  TREATMENT  Treatment for shortness of breath depends on the cause of your symptoms and can vary greatly. HOME CARE INSTRUCTIONS   Do not smoke. Smoking is a common cause of shortness of breath. If you smoke, ask for help to quit.  Avoid being around chemicals or things that may bother your breathing, such as paint fumes and dust.  Rest as needed. Slowly resume your usual activities.  If medicines were prescribed, take them as directed for the full length of time directed. This includes oxygen and any inhaled medicines.  Keep all follow-up appointments as directed by your caregiver. SEEK MEDICAL CARE IF:   Your condition does not improve in the time expected.  You have a hard time doing your normal activities even with rest.  You have any side effects or problems with the medicines prescribed.  You develop any new symptoms. SEEK IMMEDIATE MEDICAL CARE IF:   Your shortness of breath gets worse.  You  feel lightheaded, faint,  or develop a cough not controlled with medicines.  You start coughing up blood.  You have pain with breathing.  You have chest pain or pain in your arms, shoulders, or abdomen.  You have a fever.  You are unable to walk up stairs or exercise the way you normally do. MAKE SURE YOU:  Understand these instructions.  Will watch your condition.  Will get help right away if you are not doing well or get worse. Document Released: 05/08/2001 Document Revised: 02/12/2012 Document Reviewed: 10/29/2011 [facility] Patient Information 2014 [place].

## 2013-10-18 NOTE — ED Provider Notes (Signed)
Medical screening examination/treatment/procedure(s) were performed by non-physician practitioner and as supervising physician I was immediately available for consultation/collaboration.  EKG Interpretation    Date/Time:  Sunday October 18 2013 15:05:12 EST Ventricular Rate:  72 PR Interval:  148 QRS Duration: 96 QT Interval:  405 QTC Calculation: 443 R Axis:   37 Text Interpretation:  Sinus rhythm Low voltage, precordial leads Abnormal inferior Q waves since last tracing no significant change Confirmed by Janeah Kovacich  MD, Lafe Clerk ([id number]) on 10/18/2013 3:29:10 PM              Martin BuccoMelanie Rochelle Nephew, MD 10/18/13 2348

## 2013-10-18 NOTE — Progress Notes (Signed)
VASCULAR LAB PRELIMINARY  PRELIMINARY  PRELIMINARY  PRELIMINARY  Bilateral lower extremity venous duplex completed.    Preliminary report:  Bilateral:  No evidence of DVT, superficial thrombosis, or Baker's Cyst.   Dawid Dupriest, RVS 10/18/2013, 4:37 PM

## 2013-10-18 NOTE — ED Provider Notes (Signed)
CSN: [id number]     Arrival date & time 10/18/13  1416 History   First MD Initiated Contact with Patient 10/18/13 1504     Chief Complaint  Patient presents with  . Chest Pain  . Shortness of Breath  . Leg Swelling     (Consider location/radiation/quality/duration/timing/severity/associated sxs/prior Treatment) HPI Comments: Pt is a 48 y/o male with an extensive PMHx including HTN, depression, alcoholic pancreatitis, alcohol and benzo abuse, fatty liver disease, asthma and erosive esophagitis who presents to the ED with his fiance with multiple complaints. Pt states 1 month ago he took a long bus ride to [place] and his legs began to swell. Since then he has has increased pain with walking and increased swelling especially in his right leg. He flew back to [place] on 2/18 and symptoms have not improved. Admits to associated increased intermittent SOB, worse on exertion. States his chronic back pain is flaring up and he has not seen his PCP in about 2 months because he does not like him and would like a new one. Also states his abdomen has been distended for months, he saw GI, however feels as if "they do not do anything and blame it on the alcohol" and would like to see a new one. Denies chest pain, n/vd/c, dizziness, lightheadedness. Admits to drinking "a few drinks" daily.  Patient is a 48 y.o. male presenting with chest pain and shortness of breath. The history is provided by the patient and a significant other.  Chest Pain Associated symptoms: back pain and shortness of breath   Shortness of Breath Associated symptoms: chest pain     Past Medical History  Diagnosis Date  . Hypertension   . Depressive disorder, not elsewhere classified   . Osteoarthrosis, unspecified whether generalized or localized, unspecified site   . Irritable bowel syndrome   . Esophageal reflux   . Allergic rhinitis   . History of pneumonia   . Insomnia   . Anxiety   . Diverticulosis   . Alcohol abuse 2012  .  Benzodiazepine abuse   . Seizures   . Alcoholic pancreatitis   . Erosive esophagitis   . Periumbilical hernia 12/13/12  . Abnormal LFTs   . Fatty liver 10/08/11  . Internal hemorrhoids 03/13/11  . Hiatal hernia 03/13/11  . Asthmatic bronchitis   . Astigmatism   . Frequent headaches    Past Surgical History  Procedure Laterality Date  . Tibula surgery Right 2002  . Fibula fracture surgery Right    Family History  Problem Relation Age of Onset  . Heart disease Mother   . Gallbladder disease Mother   . Nephrolithiasis Mother   . Arthritis Mother   . Hyperlipidemia Mother   . Stroke Mother   . Irritable bowel syndrome Mother   . Cancer Mother     vulva  . Colon cancer Neg Hx   . Hypertension Father     moth  . Heart disease Father   . Arthritis Father   . Irritable bowel syndrome Brother     x 2   History  Substance Use Topics  . Smoking status: Former Smoker -- 1.50 packs/day for 18 years    Types: Cigarettes    Quit date: 08/28/1991  . Smokeless tobacco: Current User    Types: Chew     Comment: Tobacco info given 03/31/13  . Alcohol Use: No     Comment: no alcohol in one yr; drank 35 years alcohol, quit 02/2012  Review of Systems  Constitutional: Positive for activity change.  Respiratory: Positive for shortness of breath.   Cardiovascular: Positive for chest pain and leg swelling.  Gastrointestinal: Positive for abdominal distention.  Musculoskeletal: Positive for back pain.  All other systems reviewed and are negative.      Allergies  Codeine; Benazepril; Ativan; and Corticosteroids  Home Medications   Current Outpatient Rx  Name  Route  Sig  Dispense  Refill  . albuterol (PROVENTIL HFA;VENTOLIN HFA) 108 (90 BASE) MCG/ACT inhaler   Inhalation   Inhale 2 puffs into the lungs every 6 (six) hours as needed for wheezing or shortness of breath.          Marland Kitchen aspirin 81 MG chewable tablet   Oral   Chew 81 mg by mouth daily.         Marland Kitchen atenolol  (TENORMIN) 100 MG tablet   Oral   Take 100 mg by mouth daily.         . carbamazepine (TEGRETOL XR) 200 MG 12 hr tablet   Oral   Take 400 mg by mouth 2 (two) times daily.         . carvedilol (COREG) 12.5 MG tablet   Oral   Take 12.5 mg by mouth daily.         . Choline Fenofibrate 135 MG capsule   Oral   Take 135 mg by mouth daily.         . cloNIDine (CATAPRES) 0.1 MG tablet   Oral   Take 0.1 mg by mouth 2 (two) times daily.         . cyanocobalamin 2000 MCG tablet   Oral   Take 2,000 mcg by mouth daily.         Marland Kitchen esomeprazole (NEXIUM) 40 MG capsule   Oral   Take 40 mg by mouth daily before breakfast.         . FLUoxetine (PROZAC) 20 MG capsule   Oral   Take 60 mg by mouth daily.         Marland Kitchen gabapentin (NEURONTIN) 300 MG capsule   Oral   Take 300 mg by mouth 2 (two) times daily.         . hydrochlorothiazide (HYDRODIURIL) 25 MG tablet   Oral   Take 25 mg by mouth daily.         Marland Kitchen ibuprofen (ADVIL,MOTRIN) 200 MG tablet   Oral   Take 400 mg by mouth every 6 (six) hours as needed for moderate pain.         Marland Kitchen omeprazole (PRILOSEC) 40 MG capsule   Oral   Take 40 mg by mouth daily.         . Salicylic Acid-Urea (KERASAL) 5-10 % OINT   Apply externally   Apply 1 application topically 2 (two) times daily.         Marland Kitchen senna-docusate (SENOKOT-S) 8.6-50 MG per tablet   Oral   Take 1 tablet by mouth daily.         Marland Kitchen tetrahydrozoline 0.05 % ophthalmic solution   Both Eyes   Place 1 drop into both eyes 2 (two) times daily as needed (dry eyes).          BP 118/69  Pulse 77  Temp(Src) 97.9 F (36.6 C) (Oral)  Resp 16  SpO2 97% Physical Exam  Nursing note and vitals reviewed. Constitutional: He is oriented to person, place, and time. He appears well-developed and well-nourished. No distress.  HENT:  Head: Normocephalic and atraumatic.  Mouth/Throat: Oropharynx is clear and moist.  Eyes: Conjunctivae and EOM are normal. Pupils are  equal, round, and reactive to light.  Neck: Normal range of motion. Neck supple. No JVD present.  Cardiovascular: Normal rate, regular rhythm, normal heart sounds and intact distal pulses.   Pulses:      Dorsalis pedis pulses are 2+ on the right side, and 2+ on the left side.       Posterior tibial pulses are 2+ on the right side, and 2+ on the left side.  +1 pitting edema LE on L, +2 on R, tenderness anterior lower leg.  Pulmonary/Chest: Effort normal and breath sounds normal.  Abdominal: Soft. Bowel sounds are normal. He exhibits distension. He exhibits no mass. There is no tenderness. There is no rigidity, no rebound and no guarding.  Musculoskeletal: Normal range of motion. He exhibits no edema.       Back:  Neurological: He is alert and oriented to person, place, and time.  Speech fluent, goal oriented. Moves limbs without ataxia. Equal grip strength BL.  Skin: Skin is warm and dry. He is not diaphoretic.  Psychiatric: He has a normal mood and affect. His behavior is normal.    ED Course  Procedures (including critical care time) Labs Review Labs Reviewed  BASIC METABOLIC PANEL - Abnormal; Notable for the following:    Glucose, Bld 100 (*)    BUN 26 (*)    Creatinine, Ser 1.47 (*)    GFR calc non Af Amer 55 (*)    GFR calc Af Amer 63 (*)    All other components within normal limits  CBC - Abnormal; Notable for the following:    WBC 3.9 (*)    RBC 3.89 (*)    Hemoglobin 11.7 (*)    HCT 35.1 (*)    All other components within normal limits  HEPATIC FUNCTION PANEL - Abnormal; Notable for the following:    Total Bilirubin <0.2 (*)    All other components within normal limits  PRO B NATRIURETIC PEPTIDE - Abnormal; Notable for the following:    Pro B Natriuretic peptide (BNP) 230.4 (*)    All other components within normal limits  D-DIMER, QUANTITATIVE  TROPONIN I  URINALYSIS, ROUTINE W REFLEX MICROSCOPIC   Imaging Review No results found.  EKG Interpretation      Date/Time:  Sunday October 18 2013 15:05:12 EST Ventricular Rate:  72 PR Interval:  148 QRS Duration: 96 QT Interval:  405 QTC Calculation: 443 R Axis:   37 Text Interpretation:  Sinus rhythm Low voltage, precordial leads Abnormal inferior Q waves since last tracing no significant change Confirmed by BELFI  MD, MELANIE ([id number]) on 10/18/2013 3:29:10 PM            MDM   Final diagnoses:  Chronic pain  Shortness of breath  Edema   Pt presenting with multiple complaints. He appears in NAD, VSS. Recent long trip with lower extremity edema, SOB. He does not see his PCP. Labs pending, r/o CHF vs PE vs DVT. Will obtain LE duplex to evaluate for DVT. 5:06 PM Pt reporting it burned to urinate. Will check UA. 5:49 PM Urinalysis without infection. Labs showing slightly elevated creatinine compared to prior, 1.47 compared to 1.21. BNP 23.4. Troponin negative. D-dimer within normal limits. Doubt PE, no tachycardia or tachypnea, blood pressure stable. Advised patient to followup with PCP, chronic pain management and gastroenterology regarding his chronic complaints. Stable for d/c. Return precautions  given. Patient states understanding of treatment care plan and is agreeable.   Trevor Mace, PA-C 10/18/13 1751

## 2013-10-30 ENCOUNTER — Encounter (HOSPITAL_COMMUNITY): Payer: Self-pay | Admitting: Emergency Medicine

## 2013-10-30 ENCOUNTER — Emergency Department (HOSPITAL_COMMUNITY)
Admission: EM | Admit: 2013-10-30 | Discharge: 2013-10-31 | Disposition: A | Discharge status: Home / Self Care | Payer: Self-pay | Attending: Emergency Medicine | Admitting: Emergency Medicine

## 2013-10-30 DIAGNOSIS — K219 Gastro-esophageal reflux disease without esophagitis: Secondary | ICD-10-CM | POA: Insufficient documentation

## 2013-10-30 DIAGNOSIS — M1990 Unspecified osteoarthritis, unspecified site: Secondary | ICD-10-CM | POA: Insufficient documentation

## 2013-10-30 DIAGNOSIS — Z8701 Personal history of pneumonia (recurrent): Secondary | ICD-10-CM | POA: Insufficient documentation

## 2013-10-30 DIAGNOSIS — I10 Essential (primary) hypertension: Secondary | ICD-10-CM | POA: Insufficient documentation

## 2013-10-30 DIAGNOSIS — G40909 Epilepsy, unspecified, not intractable, without status epilepticus: Secondary | ICD-10-CM | POA: Insufficient documentation

## 2013-10-30 DIAGNOSIS — Z87891 Personal history of nicotine dependence: Secondary | ICD-10-CM | POA: Insufficient documentation

## 2013-10-30 DIAGNOSIS — IMO0002 Reserved for concepts with insufficient information to code with codable children: Secondary | ICD-10-CM | POA: Insufficient documentation

## 2013-10-30 DIAGNOSIS — W19XXXA Unspecified fall, initial encounter: Secondary | ICD-10-CM

## 2013-10-30 DIAGNOSIS — M545 Low back pain, unspecified: Secondary | ICD-10-CM

## 2013-10-30 DIAGNOSIS — G4700 Insomnia, unspecified: Secondary | ICD-10-CM | POA: Insufficient documentation

## 2013-10-30 DIAGNOSIS — W010XXA Fall on same level from slipping, tripping and stumbling without subsequent striking against object, initial encounter: Secondary | ICD-10-CM | POA: Insufficient documentation

## 2013-10-30 DIAGNOSIS — Y929 Unspecified place or not applicable: Secondary | ICD-10-CM | POA: Insufficient documentation

## 2013-10-30 DIAGNOSIS — W108XXA Fall (on) (from) other stairs and steps, initial encounter: Secondary | ICD-10-CM | POA: Insufficient documentation

## 2013-10-30 DIAGNOSIS — F411 Generalized anxiety disorder: Secondary | ICD-10-CM | POA: Insufficient documentation

## 2013-10-30 DIAGNOSIS — Y9389 Activity, other specified: Secondary | ICD-10-CM | POA: Insufficient documentation

## 2013-10-30 DIAGNOSIS — Z7982 Long term (current) use of aspirin: Secondary | ICD-10-CM | POA: Insufficient documentation

## 2013-10-30 DIAGNOSIS — Z79899 Other long term (current) drug therapy: Secondary | ICD-10-CM | POA: Insufficient documentation

## 2013-10-30 LAB — I-STAT CHEM 8, ED
BUN: 10 mg/dL (ref 6–23)
CALCIUM ION: 1.18 mmol/L (ref 1.12–1.23)
CHLORIDE: 104 meq/L (ref 96–112)
Creatinine, Ser: 1.3 mg/dL (ref 0.50–1.35)
GLUCOSE: 128 mg/dL — AB (ref 70–99)
HEMATOCRIT: 35 % — AB (ref 39.0–52.0)
Hemoglobin: 11.9 g/dL — ABNORMAL LOW (ref 13.0–17.0)
Potassium: 3.7 mEq/L (ref 3.7–5.3)
Sodium: 143 mEq/L (ref 137–147)
TCO2: 26 mmol/L (ref 0–100)

## 2013-10-30 NOTE — ED Notes (Signed)
EKG given to EDP, Bednar,MD. For review.

## 2013-10-30 NOTE — ED Notes (Signed)
Pt fell a week ago coming down some steps,  And then again today,  He has low back pain and some bruising

## 2013-10-30 NOTE — ED Provider Notes (Signed)
CSN: [id number]     Arrival date & time 10/30/13  2138 History   First MD Initiated Contact with Patient 10/30/13 2207     Chief Complaint  Patient presents with  . Fall  . Back Pain     (Consider location/radiation/quality/duration/timing/severity/associated sxs/prior Treatment) Patient is a 48 y.o. male presenting with fall and back pain.  Fall  Back Pain  48 yo male presents with LBP that started yesterday after mechanical fall while moving into his new apartment. Patient states he slipped while going down the stairs and fell landing on his back. Patient states he tried to tough it out but pain got worse today. Pain described as sharp, crampy, tight pain that is constant and currently rated at 8/10. Patient admits to "legs giving out on me" x 1 month. Patient admits to urinary incontinence at night only that started approximately 9 months ago. Denies any bowel incontinence. Denies any leg numbness. Admits to bilateral leg weakness x 1+years. Patient admits to LOC, Head trauma, and injury to Left elbow. Denies any current elbow pain.  Patient denies dizziness or CP prior to fall. Patient states I got "short winded before I fell". Denies any IVDU, Hx of cancer, or leg numbness.  PMH significant for seizure d/o but patient states he did not have one at the time of the fall.   Advanced age > 49 yo: No HTN: Yes Hyperlipidemia: Yes Cigarette smoking: No Diabetes Mellitus: No Family hx of CAD or MI < 49 yo : No Male or Post menopausal: Yes Cocaine use: No Prior MI: No  CABG: No Stress test: Yes, x 15 years ago. Noted to be normal Angina: No    Past Medical History  Diagnosis Date  . Hypertension   . Depressive disorder, not elsewhere classified   . Osteoarthrosis, unspecified whether generalized or localized, unspecified site   . Irritable bowel syndrome   . Esophageal reflux   . Allergic rhinitis   . History of pneumonia   . Insomnia   . Anxiety   . Diverticulosis   .  Alcohol abuse 2012  . Benzodiazepine abuse   . Seizures   . Alcoholic pancreatitis   . Erosive esophagitis   . Periumbilical hernia 12/13/12  . Abnormal LFTs   . Fatty liver 10/08/11  . Internal hemorrhoids 03/13/11  . Hiatal hernia 03/13/11  . Asthmatic bronchitis   . Astigmatism   . Frequent headaches    Past Surgical History  Procedure Laterality Date  . Tibula surgery Right 2002  . Fibula fracture surgery Right    Family History  Problem Relation Age of Onset  . Heart disease Mother   . Gallbladder disease Mother   . Nephrolithiasis Mother   . Arthritis Mother   . Hyperlipidemia Mother   . Stroke Mother   . Irritable bowel syndrome Mother   . Cancer Mother     vulva  . Colon cancer Neg Hx   . Hypertension Father     moth  . Heart disease Father   . Arthritis Father   . Irritable bowel syndrome Brother     x 2   History  Substance Use Topics  . Smoking status: Former Smoker -- 1.50 packs/day for 18 years    Types: Cigarettes    Quit date: 08/28/1991  . Smokeless tobacco: Current User    Types: Chew     Comment: Tobacco info given 03/31/13  . Alcohol Use: No     Comment: no alcohol in one  yr; drank 35 years alcohol, quit 02/2012    Review of Systems  Musculoskeletal: Positive for back pain.  All other systems reviewed and are negative.      Allergies  Codeine; Benazepril; Ativan; and Corticosteroids  Home Medications   Current Outpatient Rx  Name  Route  Sig  Dispense  Refill  . albuterol (PROVENTIL HFA;VENTOLIN HFA) 108 (90 BASE) MCG/ACT inhaler   Inhalation   Inhale 2 puffs into the lungs every 6 (six) hours as needed for wheezing or shortness of breath.          Marland Kitchen aspirin 81 MG chewable tablet   Oral   Chew 81 mg by mouth daily.         Marland Kitchen atenolol (TENORMIN) 100 MG tablet   Oral   Take 100 mg by mouth daily.         . carbamazepine (TEGRETOL XR) 200 MG 12 hr tablet   Oral   Take 400 mg by mouth 2 (two) times daily.         .  carvedilol (COREG) 12.5 MG tablet   Oral   Take 12.5 mg by mouth daily.         . Choline Fenofibrate 135 MG capsule   Oral   Take 135 mg by mouth daily.         . cloNIDine (CATAPRES) 0.1 MG tablet   Oral   Take 0.1 mg by mouth 2 (two) times daily.         . cyanocobalamin 2000 MCG tablet   Oral   Take 2,000 mcg by mouth daily.         Marland Kitchen esomeprazole (NEXIUM) 40 MG capsule   Oral   Take 40 mg by mouth daily before breakfast.         . FLUoxetine (PROZAC) 20 MG capsule   Oral   Take 60 mg by mouth daily.         . fluticasone (FLONASE) 50 MCG/ACT nasal spray   Each Nare   Place 1 spray into both nostrils daily.         Marland Kitchen gabapentin (NEURONTIN) 300 MG capsule   Oral   Take 300 mg by mouth 2 (two) times daily.         . hydrochlorothiazide (HYDRODIURIL) 25 MG tablet   Oral   Take 25 mg by mouth daily.         Marland Kitchen ibuprofen (ADVIL,MOTRIN) 200 MG tablet   Oral   Take 400 mg by mouth every 6 (six) hours as needed for moderate pain.         Marland Kitchen omeprazole (PRILOSEC) 40 MG capsule   Oral   Take 40 mg by mouth daily.         . naproxen (NAPROSYN) 500 MG tablet   Oral   Take 1 tablet (500 mg total) by mouth 2 (two) times daily.   30 tablet   0    BP 128/83  Pulse 76  Temp(Src) 98.3 F (36.8 C) (Oral)  Resp 18  SpO2 96% Physical Exam  Nursing note and vitals reviewed. Constitutional: He is oriented to person, place, and time. He appears well-developed and well-nourished. No distress.  HENT:  Head: Normocephalic and atraumatic.  Mouth/Throat: Uvula is midline, oropharynx is clear and moist and mucous membranes are normal.  Eyes: Conjunctivae and EOM are normal. Pupils are equal, round, and reactive to light. No scleral icterus.  Neck: Trachea normal and phonation normal. Neck supple. No  JVD present. Carotid bruit is not present. No tracheal deviation present.  Cardiovascular: Normal rate and regular rhythm.  Exam reveals no gallop and no  friction rub.   No murmur heard. Pulmonary/Chest: Effort normal and breath sounds normal. No respiratory distress. He has no wheezes. He has no rhonchi. He has no rales.  Abdominal: Soft. Bowel sounds are normal. He exhibits no distension. There is no hepatosplenomegaly. There is no tenderness. There is no rigidity, no rebound, no guarding, no tenderness at McBurney's point and negative Murphy's sign.  Musculoskeletal: Normal range of motion. He exhibits no edema.  Midline tenderness at lower lumbar spine.  No midline tenderness in Thoracic or Cervical spine.  There is no visual evidence of injury to low back on exam. No bruising or swelling    Neurological: He is alert and oriented to person, place, and time. He has normal strength. No cranial nerve deficit or sensory deficit.  CN II-XII grossly intact. Cerebellar function appears intact with finger to nose exam. Patient ambulates in room without assistance.     Skin: Skin is warm and dry. He is not diaphoretic.  Psychiatric: He has a normal mood and affect. His behavior is normal.    ED Course  Procedures (including critical care time) Labs Review Labs Reviewed  I-STAT CHEM 8, ED - Abnormal; Notable for the following:    Glucose, Bld 128 (*)    Hemoglobin 11.9 (*)    HCT 35.0 (*)    All other components within normal limits   Imaging Review Dg Lumbar Spine Complete  10/31/2013   CLINICAL DATA:  Low back pain status post recent fall.  EXAM: LUMBAR SPINE - COMPLETE 4+ VIEW  COMPARISON:  Prior CT from 05/08/2013.  FINDINGS: Vertebral bodies are normally aligned with preservation of the normal lumbar lordosis. Vertebral body heights are preserved. No acute vertebral body fracture. There is no listhesis. A linear lucency traversing the left transverse process of L2 is present, unchanged relative to prior CT from 05/08/2013. Mild degenerative intervertebral disc space narrowing present at L5-S1. No significant canal or foraminal stenosis  appreciated.  IMPRESSION: 1. No acute fracture or listhesis within the lumbar spine. 2. Mild degenerative disc disease at L5-S1.   Electronically Signed   By: Rise Mu M.D.   On: 10/31/2013 01:37     EKG Interpretation   Date/Time:  Friday October 30 2013 23:56:22 EST Ventricular Rate:  69 PR Interval:  139 QRS Duration: 96 QT Interval:  434 QTC Calculation: 465 R Axis:   55 Text Interpretation:  Sinus rhythm RSR' or QR pattern in V1 suggests right  ventricular conduction delay Compared to previous tracing RSR prime NOW  PRESENT Confirmed by [facility]  MD, Jonny Ruiz ([id number]) on 10/30/2013 11:59:11 PM      MDM   Final diagnoses:  LBP (low back pain)  Fall   Patient afebrile with normal VS.  Hgb low at 11.9. Appears stable. Improved since previous study.  Plain films shows no acute fracture or listhesis. Mild degenerative disc disease at L5-S1 Residual post void volume < 100 ml Normal Neuro exam. No evidence of Saddle anesthesia or urinary retention. Doubt Cauda equina.   Patient pain markedly improved with tx in ED. Discussed labs, and exam findings with patient. Advised follow up with [facility]. Recommend return to ED should symptoms worsen. Patient agrees with plan.  Discharged in good condition.    Meds given in ED:  Medications  ketorolac (TORADOL) injection 60  mg (60 mg Intramuscular Given 10/31/13 0032)    New Prescriptions   No medications on file         Rudene Anda, [place] 10/31/13 0308

## 2013-10-31 ENCOUNTER — Emergency Department (HOSPITAL_COMMUNITY): Payer: Self-pay

## 2013-10-31 MED ORDER — KETOROLAC TROMETHAMINE 60 MG/2ML IM SOLN
60.0000 mg | Freq: Once | INTRAMUSCULAR | Status: AC
  Administered 2013-10-31: 60 mg via INTRAMUSCULAR
  Filled 2013-10-31: qty 2

## 2013-10-31 MED ORDER — METHOCARBAMOL 500 MG PO TABS: 500.0000 mg | ORAL_TABLET | Freq: Two times a day (BID) | ORAL | Status: DC

## 2013-10-31 NOTE — Discharge Instructions (Signed)
Make follow up appointment with [facility] for further management of your back pain. Take medications as directed. Return to Emergency department should you develop any worsening symptoms.   . Back Pain, Adult Back pain is very common. The pain often gets better over time. The cause of back pain is usually not dangerous. Most people can learn to manage their back pain on their own.  HOME CARE   Stay active. Start with short walks on flat ground if you can. Try to walk farther each day.  Do not sit, drive, or stand in one place for more than 30 minutes. Do not stay in bed.  Do not avoid exercise or work. Activity can help your back heal faster.  Be careful when you bend or lift an object. Bend at your knees, keep the object close to you, and do not twist.  Sleep on a firm mattress. Lie on your side, and bend your knees. If you lie on your back, put a pillow under your knees.  Only take medicines as told by your doctor.  Put ice on the injured area.  Put ice in a plastic bag.  Place a towel between your skin and the bag.  Leave the ice on for 15-20 minutes, 03-04 times a day for the first 2 to 3 days. After that, you can switch between ice and heat packs.  Ask your doctor about back exercises or massage.  Avoid feeling anxious or stressed. Find good ways to deal with stress, such as exercise. GET HELP RIGHT AWAY IF:   Your pain does not go away with rest or medicine.  Your pain does not go away in 1 week.  You have new problems.  You do not feel well.  The pain spreads into your legs.  You cannot control when you poop (bowel movement) or pee (urinate).  Your arms or legs feel weak or lose feeling (numbness).  You feel sick to your stomach (nauseous) or throw up (vomit).  You have belly (abdominal) pain.  You feel like you may pass out (faint). MAKE SURE YOU:   Understand these instructions.  Will watch your condition.  Will get  help right away if you are not doing well or get worse. Document Released: 01/30/2008 Document Revised: 11/05/2011 Document Reviewed: 01/01/2011 [facility] Patient Information 2014 [employer] [place].

## 2013-11-01 NOTE — ED Provider Notes (Signed)
Medical screening examination/treatment/procedure(s) were performed by non-physician practitioner and as supervising physician I was immediately available for consultation/collaboration.  Toy BakerAnthony T Ovida Delagarza, MD 11/01/13 [phone]

## 2013-11-03 ENCOUNTER — Encounter: Payer: Self-pay | Admitting: Internal Medicine

## 2013-11-05 ENCOUNTER — Encounter: Payer: Self-pay | Admitting: Neurology

## 2013-11-05 ENCOUNTER — Ambulatory Visit (INDEPENDENT_AMBULATORY_CARE_PROVIDER_SITE_OTHER): Payer: Self-pay | Admitting: Neurology

## 2013-11-05 VITALS — BP 140/90 | HR 80 | Temp 97.0°F | Resp 18 | Ht 62.0 in | Wt 195.3 lb

## 2013-11-05 DIAGNOSIS — G44229 Chronic tension-type headache, not intractable: Secondary | ICD-10-CM

## 2013-11-05 DIAGNOSIS — R569 Unspecified convulsions: Secondary | ICD-10-CM

## 2013-11-05 MED ORDER — ATENOLOL 100 MG PO TABS: 100.0000 mg | ORAL_TABLET | Freq: Every day | ORAL | Status: DC

## 2013-11-05 MED ORDER — TOPIRAMATE 25 MG PO TABS: 25.0000 mg | ORAL_TABLET | Freq: Every day | ORAL | Status: DC

## 2013-11-05 NOTE — Patient Instructions (Signed)
1.  We will try topiramate 25mg  at bedtime.  We will see if Med Assistance will cover this.  Possible side effects include: impaired thinking, sedation, paresthesias (numbness and tingling) and weight loss.  It may cause dehydration and there is a small risk for kidney stones, so make sure to stay hydrated with water during the day.  There is also a very small risk for glaucoma, so if you notice any change in your vision while taking this medication, see an ophthalmologist.  There is also a very small risk of possible suicidal ideation, as it the case with all antiepileptic medications.  2.  We will refill atenolol 100mg  daily  3.  [facility].  4.  Follow up in 3 months.

## 2013-11-05 NOTE — Progress Notes (Signed)
NEUROLOGY FOLLOW UP OFFICE NOTE  Martin Singh [id number]  HISTORY OF PRESENT ILLNESS: Martin Singh is a [age] year old man who is following up for chronic daily headaches and seizure-like episodes.  Records and images were personally reviewed where available.    UPDATE: I wanted to send him for evaluation at [facility] but he has no insurance.  His last daytime seizure was on Tuesday.  It was the first daytime seizure in a couple of months.  He sat down and his legs began to shake.  No loss of consciousness.  He calmed himself down and it resolved after about 4 minutes.  He still has the spells while asleep every night.  His fiance wakes him up when he is finishing having them.   He was seen in the ED on 10/18/13 for leg pain and edema and intermittent shortness of breath after a long bus ride.  His mother had passed away.  Workup for PE was negative.  He returned to the ED on 10/30/13 for back pain after he fell due to slipping down the stairs.  XR of lumbar spine revealed mild degenerative changes at L5-S1 but no acute fractures.  He has been taking naproxen a couple of times a day.  He still gets headaches daily, but they are manageable and better since increase of atenolol.  He takes an Advil or Tylenol about 4 times a month.   HISTORY: SEIZURE-LIKE EPISODES: Onset of seizures started about 1 year ago, around the time he quit alcohol and lost custody of his son.  It started around the time he quit alcohol.  He will suddenly feel his whole body stiffen and shake.  He does not lose awareness.  It lasts for a couple of minutes.  No postictal state or preceding aura.  No urinary incontinence.  No tongue biting.  Will sometimes note urinary urgency but usually able to hold it in.  Stress and panic attacks trigger them.  He also has spells that occur while asleep, as witnessed by his caretaker.  In his sleep, he would stiffen with flexed posturing of his arms and start shaking for a couple of minutes.   Sometimes he would awake but other times he would remain asleep.  In the morning, he sometimes he has wet the bed.  Spells occur about 2 times a week but can fluctuate, sometimes goes a couple of weeks without episode.  He takes Tegretol 200/200/400.  Tegretol level from 07/22/13 was 11.7.  Since last visit, he had about two daytime spells.  A month ago, he had one of these spells while carrying the trash down the stairs.  He hit his head and back.  He says his fiance still tells him he has spells while asleep.  04/13/13 MRI Brain w/wo:  mild small vessel changes, but nothing remarkable. 04/21/13 EEG revealed mild generalized background slowing, likely pharmacologic effect. 10/18/13 LABS:  Na 141, K 4.1, Cl 102, HCO2 27, gluc 100, BUN 26, Cr 1.47, Ca 9, TB 7, Alb 4.1, AST 29, ALT 27, AP 47, TB <0.2, DB <0.2  CHRONIC TENSION HEADACHES: History of migraines but has been daily for the past 6 months.  It's a nonthrobbing pressure-like pain in a band-like distribution, radiating into his neck, about 7/10.  It is associated with nausea, but not photophobia or phonophobia.  Resting and ice packs help relieve.  Activity makes it worse.  Advil, Tylenol and Excedrin are not effective.  He was taking them daily  but not anymore.  He does have neck pain.  He was initially on nortriptyline, which was discontinued after it was thought to have contributed to suicidal ideation.  He was titrated up on atenolol to 100mg  daily.  He went to PT for the neck, which helped.   He still tries to do the neck exercises that he learned last time at PT, but the headaches persist.  He rarely uses ibuprofen.  Receives psychiatric care from Lauris Poag, NP and [place] at [facility].  PAST MEDICAL HISTORY: Past Medical History  Diagnosis Date  . Hypertension   . Depressive disorder, not elsewhere classified   . Osteoarthrosis, unspecified whether generalized or localized, unspecified site   . Irritable  bowel syndrome   . Esophageal reflux   . Allergic rhinitis   . History of pneumonia   . Insomnia   . Anxiety   . Diverticulosis   . Alcohol abuse 2012  . Benzodiazepine abuse   . Seizures   . Alcoholic pancreatitis   . Erosive esophagitis   . Periumbilical hernia 12/13/12  . Abnormal LFTs   . Fatty liver 10/08/11  . Internal hemorrhoids 03/13/11  . Hiatal hernia 03/13/11  . Asthmatic bronchitis   . Astigmatism   . Frequent headaches     MEDICATIONS: Current Outpatient Prescriptions on File Prior to Visit  Medication Sig Dispense Refill  . albuterol (PROVENTIL HFA;VENTOLIN HFA) 108 (90 BASE) MCG/ACT inhaler Inhale 2 puffs into the lungs every 6 (six) hours as needed for wheezing or shortness of breath.       Marland Kitchen aspirin 81 MG chewable tablet Chew 81 mg by mouth daily.      . carbamazepine (TEGRETOL XR) 200 MG 12 hr tablet Take 400 mg by mouth 2 (two) times daily.      . carvedilol (COREG) 12.5 MG tablet Take 12.5 mg by mouth daily.      . Choline Fenofibrate 135 MG capsule Take 135 mg by mouth daily.      . cloNIDine (CATAPRES) 0.1 MG tablet Take 0.1 mg by mouth 2 (two) times daily.      . cyanocobalamin 2000 MCG tablet Take 2,000 mcg by mouth daily.      Marland Kitchen esomeprazole (NEXIUM) 40 MG capsule Take 40 mg by mouth daily before breakfast.      . FLUoxetine (PROZAC) 20 MG capsule Take 60 mg by mouth daily.      . fluticasone (FLONASE) 50 MCG/ACT nasal spray Place 1 spray into both nostrils daily.      Marland Kitchen gabapentin (NEURONTIN) 300 MG capsule Take 300 mg by mouth 2 (two) times daily.      . hydrochlorothiazide (HYDRODIURIL) 25 MG tablet Take 25 mg by mouth daily.      Marland Kitchen ibuprofen (ADVIL,MOTRIN) 200 MG tablet Take 400 mg by mouth every 6 (six) hours as needed for moderate pain.      . methocarbamol (ROBAXIN) 500 MG tablet Take 1 tablet (500 mg total) by mouth 2 (two) times daily.  20 tablet  0  . naproxen (NAPROSYN) 500 MG tablet Take 1 tablet (500 mg total) by mouth 2 (two) times daily.  30  tablet  0  . omeprazole (PRILOSEC) 40 MG capsule Take 40 mg by mouth daily.       No current facility-administered medications on file prior to visit.    ALLERGIES: Allergies  Allergen Reactions  . Codeine Anaphylaxis  . Benazepril Cough  . Ativan [Lorazepam] Other (See Comments)  Becomes violent and hallucinates  . Corticosteroids Other (See Comments)    Shaky and high blood pressure    FAMILY HISTORY: Family History  Problem Relation Age of Onset  . Heart disease Mother   . Gallbladder disease Mother   . Nephrolithiasis Mother   . Arthritis Mother   . Hyperlipidemia Mother   . Stroke Mother   . Irritable bowel syndrome Mother   . Cancer Mother     vulva  . Colon cancer Neg Hx   . Hypertension Father     moth  . Heart disease Father   . Arthritis Father   . Irritable bowel syndrome Brother     x 2    SOCIAL HISTORY: History   Social History  . Marital Status: Divorced    Spouse Name: N/A    Number of Children: 1  . Years of Education: N/A   Occupational History  . unemployed    Social History Main Topics  . Smoking status: Former Smoker -- 1.50 packs/day for 18 years    Types: Cigarettes    Quit date: 08/28/1991  . Smokeless tobacco: Current User    Types: Chew     Comment: Tobacco info given 03/31/13  . Alcohol Use: No     Comment: no alcohol in one yr; drank 35 years alcohol, quit 02/2012  . Drug Use: No     Comment: benzos- last used 1 month ago; hx of THC, cocaine, uppers/downers  . Sexual Activity: Yes     Comment: no birth control   Other Topics Concern  . Not on file   Social History Narrative  . No narrative on file    REVIEW OF SYSTEMS: Constitutional: No fevers, chills, or sweats, no generalized fatigue, change in appetite Eyes: No visual changes, double vision, eye pain Ear, nose and throat: No hearing loss, ear pain, nasal congestion, sore throat Cardiovascular: No chest pain, palpitations Respiratory:  No shortness of breath at  rest or with exertion, wheezes GastrointestinaI: No nausea, vomiting, diarrhea, abdominal pain, fecal incontinence Genitourinary:  No dysuria, urinary retention or frequency Musculoskeletal:  No neck pain, back pain Integumentary: No rash, pruritus, skin lesions Neurological: as above Psychiatric: No depression, insomnia, anxiety Endocrine: No palpitations, fatigue, diaphoresis, mood swings, change in appetite, change in weight, increased thirst Hematologic/Lymphatic:  No anemia, purpura, petechiae. Allergic/Immunologic: no itchy/runny eyes, nasal congestion, recent allergic reactions, rashes  PHYSICAL EXAM: Filed Vitals:   11/05/13 1448  BP: 140/90  Pulse: 80  Temp: 97 F (36.1 C)  Resp: 18   General: No acute distress Head:  Normocephalic/atraumatic Neck: supple, no paraspinal tenderness, full range of motion Heart:  Regular rate and rhythm Lungs:  Clear to auscultation bilaterally Back: No paraspinal tenderness Neurological Exam: alert and oriented to person, place, and time. Speech fluent and not dysarthric, language intact.  CN II-XII intact. Fundoscopic exam unremarkable, no papilledema.  Bulk and tone normal, muscle strength 5/5 throughout.  Sensation to light touch, temperature and vibration intact.  Deep tendon reflexes 2+ throughout, toes downgoing.  Finger to nose and heel to shin testing intact.  Gait normal, Romberg negative.  IMPRESSION: Convulsions.  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 Chronic daily tension headache  PLAN: 1.  Will refer to EMU at [facility]  2.  Continue atenolol 100mg  daily.  Will add topamax 25mg  at bedtime to see if it helps reduce frequency.  Continue neck exercises.  Limit use of NSAIDs 3.  Continue tegretol XR 400mg  twice daily. 4.  Follow up in 3 months.  Shon Millet, DO  CC:  Lauris Poag, NP

## 2013-11-09 ENCOUNTER — Telehealth: Payer: Self-pay | Admitting: *Deleted

## 2013-11-09 NOTE — Telephone Encounter (Signed)
I spoke with this patient  Per Dr Everlena CooperJaffe and ask him to let his PCP look into a change in his atenolol per his pharmacy there are requesting a different beta blocker this medication is prescribed by his PCP at present

## 2013-11-17 ENCOUNTER — Ambulatory Visit (INDEPENDENT_AMBULATORY_CARE_PROVIDER_SITE_OTHER): Payer: Self-pay | Admitting: Emergency Medicine

## 2013-11-17 ENCOUNTER — Encounter: Payer: Self-pay | Admitting: Emergency Medicine

## 2013-11-17 VITALS — BP 158/116 | HR 69 | Ht 62.0 in | Wt 196.0 lb

## 2013-11-17 DIAGNOSIS — J3489 Other specified disorders of nose and nasal sinuses: Secondary | ICD-10-CM

## 2013-11-17 DIAGNOSIS — R0609 Other forms of dyspnea: Secondary | ICD-10-CM

## 2013-11-17 DIAGNOSIS — R0600 Dyspnea, unspecified: Secondary | ICD-10-CM | POA: Insufficient documentation

## 2013-11-17 DIAGNOSIS — R0981 Nasal congestion: Secondary | ICD-10-CM | POA: Insufficient documentation

## 2013-11-17 DIAGNOSIS — R0989 Other specified symptoms and signs involving the circulatory and respiratory systems: Secondary | ICD-10-CM

## 2013-11-17 MED ORDER — TOPIRAMATE 25 MG PO TABS: 25.0000 mg | ORAL_TABLET | Freq: Every day | ORAL | Status: DC

## 2013-11-17 MED ORDER — ATENOLOL 100 MG PO TABS: 100.0000 mg | ORAL_TABLET | Freq: Every day | ORAL | Status: DC

## 2013-11-17 NOTE — Patient Instructions (Signed)
We will perform full pulmonary function testing You should try doing nasal saline irrigation washes once a day It is OK to restart your atenolol and topamax for now. We will give you prescriptions for these.  Follow with Dr Delton CoombesByrum next available with your full PFT

## 2013-11-17 NOTE — Assessment & Plan Note (Signed)
Etiology unclear, suspect multifactorial. Possible obstruction based on hx.  - full PFT - albuterol prn - start BD's' depending on the results of above - next avail

## 2013-11-17 NOTE — Assessment & Plan Note (Signed)
-   start NSW's

## 2013-11-17 NOTE — Progress Notes (Signed)
Subjective:    Patient ID: Martin Singh, male    DOB: June 23, 1966, 48 y.o.   MRN: [id number]  HPI 48 yo man, 28 pk-years tobacco, seizures, depression, EtOH and substance use, HH. Presents for eval of SOB that has been noticeable for almost 20 years. He was dx with suspected asthma at that time.  He stopped smoking about 20 yrs ago. He is having severe DOE with minimal exertion. He is having higher BP and HA after atenolol stopped recently.  He uses albuterol about 1-2x a day. Has a regular dry cough. Has some exertional wheeze. He snores and has witnessed apneas.  He has a lot of sinus drainage, congestion, some bleeding.  Has GERD that is managed by nexium.   He was seen in the ED on 10/18/13 for leg pain and edema and intermittent shortness of breath after a long bus ride / plane ride. His mother had passed away. Workup for PE was negative. He returned to the ED on 10/30/13 for back pain after he fell due to slipping down the stairs. XR of lumbar spine revealed mild degenerative changes at L5-S1 but no acute fractures. He has been taking naproxen a couple of times a day.     Review of Systems  Constitutional: Negative for fever and unexpected weight change.  HENT: Positive for dental problem, ear pain, postnasal drip, rhinorrhea and sinus pressure. Negative for congestion, nosebleeds, sneezing, sore throat and trouble swallowing.   Eyes: Negative for redness and itching.  Respiratory: Positive for cough, chest tightness and shortness of breath. Negative for wheezing.   Cardiovascular: Negative for palpitations and leg swelling.  Gastrointestinal: Negative for nausea and vomiting.  Genitourinary: Negative for dysuria.  Musculoskeletal: Positive for joint swelling.  Skin: Negative for rash.  Neurological: Negative for headaches.  Hematological: Does not bruise/bleed easily.  Psychiatric/Behavioral: Positive for dysphoric mood. The patient is nervous/anxious.    Past Medical History   Diagnosis Date  . Hypertension   . Depressive disorder, not elsewhere classified   . Osteoarthrosis, unspecified whether generalized or localized, unspecified site   . Irritable bowel syndrome   . Esophageal reflux   . Allergic rhinitis   . History of pneumonia   . Insomnia   . Anxiety   . Diverticulosis   . Alcohol abuse 2012  . Benzodiazepine abuse   . Seizures   . Alcoholic pancreatitis   . Erosive esophagitis   . Periumbilical hernia 12/13/12  . Abnormal LFTs   . Fatty liver 10/08/11  . Internal hemorrhoids 03/13/11  . Hiatal hernia 03/13/11  . Asthmatic bronchitis   . Astigmatism   . Frequent headaches      Family History  Problem Relation Age of Onset  . Heart disease Mother   . Gallbladder disease Mother   . Nephrolithiasis Mother   . Arthritis Mother   . Hyperlipidemia Mother   . Stroke Mother   . Irritable bowel syndrome Mother   . Cancer Mother     vulva  . Colon cancer Neg Hx   . Hypertension Father     moth  . Heart disease Father   . Arthritis Father   . Irritable bowel syndrome Brother     x 2     History   Social History  . Marital Status: Divorced    Spouse Name: N/A    Number of Children: 1  . Years of Education: N/A   Occupational History  . unemployed    Social History Main  Topics  . Smoking status: Former Smoker -- 1.50 packs/day for 18 years    Types: Cigarettes    Quit date: 08/28/1991  . Smokeless tobacco: Current User    Types: Chew     Comment: currently uses snuff.11/17/13  . Alcohol Use: No     Comment: no alcohol in one yr; drank 35 years alcohol, quit 02/2012  . Drug Use: No     Comment: benzos- last used 1 month ago; hx of THC, cocaine, uppers/downers  . Sexual Activity: Yes     Comment: no birth control   Other Topics Concern  . Not on file   Social History Narrative  . No narrative on file     Allergies  Allergen Reactions  . Codeine Anaphylaxis  . Benazepril Cough  . Ativan [Lorazepam] Other (See Comments)     Becomes violent and hallucinates  . Corticosteroids Other (See Comments)    Shaky and high blood pressure     Outpatient Prescriptions Prior to Visit  Medication Sig Dispense Refill  . albuterol (PROVENTIL HFA;VENTOLIN HFA) 108 (90 BASE) MCG/ACT inhaler Inhale 2 puffs into the lungs every 6 (six) hours as needed for wheezing or shortness of breath.       Marland Kitchen aspirin 81 MG chewable tablet Chew 81 mg by mouth daily.      . carbamazepine (TEGRETOL XR) 200 MG 12 hr tablet Take 400 mg by mouth 2 (two) times daily.      . carvedilol (COREG) 12.5 MG tablet Take 12.5 mg by mouth daily.      . Choline Fenofibrate 135 MG capsule Take 135 mg by mouth daily.      . cloNIDine (CATAPRES) 0.1 MG tablet Take 0.1 mg by mouth 2 (two) times daily.      Marland Kitchen esomeprazole (NEXIUM) 40 MG capsule Take 40 mg by mouth daily before breakfast.      . FLUoxetine (PROZAC) 20 MG capsule Take 60 mg by mouth daily.      . fluticasone (FLONASE) 50 MCG/ACT nasal spray Place 1 spray into both nostrils daily.      Marland Kitchen gabapentin (NEURONTIN) 300 MG capsule Take 300 mg by mouth 2 (two) times daily.      . hydrochlorothiazide (HYDRODIURIL) 25 MG tablet Take 25 mg by mouth daily.      Marland Kitchen ibuprofen (ADVIL,MOTRIN) 200 MG tablet Take 400 mg by mouth every 6 (six) hours as needed for moderate pain.      Marland Kitchen omeprazole (PRILOSEC) 40 MG capsule Take 40 mg by mouth daily.      . methocarbamol (ROBAXIN) 500 MG tablet Take 1 tablet (500 mg total) by mouth 2 (two) times daily.  20 tablet  0  . naproxen (NAPROSYN) 500 MG tablet Take 1 tablet (500 mg total) by mouth 2 (two) times daily.  30 tablet  0  . atenolol (TENORMIN) 100 MG tablet Take 1 tablet (100 mg total) by mouth daily.  90 tablet  3  . cyanocobalamin 2000 MCG tablet Take 2,000 mcg by mouth daily.      Marland Kitchen topiramate (TOPAMAX) 25 MG tablet Take 1 tablet (25 mg total) by mouth at bedtime.  30 tablet  0   No facility-administered medications prior to visit.   09/28/2013 - CXR COMPARISON:  12/20/2012, 12/13/2012, 06/10/2012, and 11/19/2011  FINDINGS:  Heart size and pulmonary vascularity are normal and the lungs are  clear except for minimal linear atelectasis visible on the lateral  view. No osseous abnormality of significance.  IMPRESSION:  Minimal atelectasis as described. Otherwise, normal exam.      Objective:   Physical Exam Filed Vitals:   11/17/13 1521  BP: 158/116  Pulse: 69  Height: 5\' 2"  (1.575 m)  Weight: 196 lb (88.905 kg)  SpO2: 98%   Gen: Pleasant, well-nourished, in no distress,  normal affect  ENT: No lesions,  mouth clear,  oropharynx clear, no postnasal drip  Neck: No JVD, no TMG, no carotid bruits  Lungs: No use of accessory muscles, clear without rales or rhonchi  Cardiovascular: RRR, heart sounds normal, no murmur or gallops, no peripheral edema  Abdomen: soft, protuberant, benign  Musculoskeletal: No deformities, no cyanosis or clubbing  Neuro: alert, non focal  Skin: Warm, no lesions or rashes        Assessment & Plan:  Dyspnea Etiology unclear, suspect multifactorial. Possible obstruction based on hx.  - full PFT - albuterol prn - start BD's' depending on the results of above - next avail  Sinus congestion - start NSW's

## 2013-12-08 ENCOUNTER — Ambulatory Visit (INDEPENDENT_AMBULATORY_CARE_PROVIDER_SITE_OTHER): Payer: Self-pay | Admitting: Emergency Medicine

## 2013-12-08 ENCOUNTER — Encounter: Payer: Self-pay | Admitting: Emergency Medicine

## 2013-12-08 ENCOUNTER — Ambulatory Visit (HOSPITAL_COMMUNITY)
Admission: RE | Admit: 2013-12-08 | Discharge: 2013-12-08 | Disposition: A | Discharge status: Home / Self Care | Payer: Self-pay | Source: Ambulatory Visit | Attending: Emergency Medicine | Admitting: Emergency Medicine

## 2013-12-08 VITALS — BP 130/84 | HR 76 | Ht 60.0 in | Wt 194.8 lb

## 2013-12-08 DIAGNOSIS — R0600 Dyspnea, unspecified: Secondary | ICD-10-CM

## 2013-12-08 DIAGNOSIS — R0989 Other specified symptoms and signs involving the circulatory and respiratory systems: Principal | ICD-10-CM | POA: Insufficient documentation

## 2013-12-08 DIAGNOSIS — Z87891 Personal history of nicotine dependence: Secondary | ICD-10-CM | POA: Insufficient documentation

## 2013-12-08 DIAGNOSIS — R0609 Other forms of dyspnea: Secondary | ICD-10-CM | POA: Insufficient documentation

## 2013-12-08 DIAGNOSIS — J45909 Unspecified asthma, uncomplicated: Secondary | ICD-10-CM | POA: Insufficient documentation

## 2013-12-08 LAB — PULMONARY FUNCTION TEST
DL/VA % pred: 157 %
DL/VA: 6.01 ml/min/mmHg/L
DLCO UNC: 23.99 ml/min/mmHg
DLCO unc % pred: 127 %
FEF 25-75 POST: 3.42 L/s
FEF 25-75 Pre: 1.97 L/sec
FEF2575-%CHANGE-POST: 73 %
FEF2575-%PRED-POST: 127 %
FEF2575-%Pred-Pre: 73 %
FEV1-%Change-Post: 18 %
FEV1-%PRED-PRE: 84 %
FEV1-%Pred-Post: 100 %
FEV1-POST: 2.8 L
FEV1-PRE: 2.37 L
FEV1FVC-%CHANGE-POST: 6 %
FEV1FVC-%Pred-Pre: 99 %
FEV6-%Change-Post: 10 %
FEV6-%Pred-Post: 99 %
FEV6-%Pred-Pre: 89 %
FEV6-Post: 3.39 L
FEV6-Pre: 3.06 L
FEV6FVC-%Pred-Post: 104 %
FEV6FVC-%Pred-Pre: 104 %
FVC-%Change-Post: 10 %
FVC-%PRED-POST: 96 %
FVC-%Pred-Pre: 86 %
FVC-Post: 3.39 L
FVC-Pre: 3.06 L
POST FEV1/FVC RATIO: 83 %
Post FEV6/FVC ratio: 100 %
Pre FEV1/FVC ratio: 77 %
Pre FEV6/FVC Ratio: 100 %
RV % PRED: 135 %
RV: 1.98 L
TLC % pred: 99 %
TLC: 4.92 L

## 2013-12-08 MED ORDER — ALBUTEROL SULFATE (2.5 MG/3ML) 0.083% IN NEBU
2.5000 mg | INHALATION_SOLUTION | Freq: Once | RESPIRATORY_TRACT | Status: AC
  Administered 2013-12-08: 2.5 mg via RESPIRATORY_TRACT

## 2013-12-08 NOTE — Assessment & Plan Note (Signed)
PFT support mild asthma based on BD response - will trial symbicort to see if he benefits.  - rov 1

## 2013-12-08 NOTE — Progress Notes (Signed)
   Subjective:    Patient ID: Martin Singh, male    DOB: 11-Jun-1966, 48 y.o.   MRN: [id number]  HPI 48 yo man, 28 pk-years tobacco, seizures, depression, EtOH and substance use, HH. Presents for eval of SOB that has been noticeable for almost 20 years. He was dx with suspected asthma at that time.  He stopped smoking about 20 yrs ago. He is having severe DOE with minimal exertion. He is having higher BP and HA after atenolol stopped recently.  He uses albuterol about 1-2x a day. Has a regular dry cough. Has some exertional wheeze. He snores and has witnessed apneas.  He has a lot of sinus drainage, congestion, some bleeding.  Has GERD that is managed by nexium.   He was seen in the ED on 10/18/13 for leg pain and edema and intermittent shortness of breath after a long bus ride / plane ride. His mother had passed away. Workup for PE was negative. He returned to the ED on 10/30/13 for back pain after he fell due to slipping down the stairs. XR of lumbar spine revealed mild degenerative changes at L5-S1 but no acute fractures. He has been taking naproxen a couple of times a day.   ROV 12/08/13 -- follows up for dyspnea, sinus disease. 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 He is on flonase.    Review of Systems  Constitutional: Negative for fever and unexpected weight change.  HENT: Positive for dental problem, ear pain, postnasal drip, rhinorrhea and sinus pressure. Negative for congestion, nosebleeds, sneezing, sore throat and trouble swallowing.   Eyes: Negative for redness and itching.  Respiratory: Positive for cough, chest tightness and shortness of breath. Negative for wheezing.   Cardiovascular: Negative for palpitations and leg swelling.  Gastrointestinal: Negative for nausea and vomiting.  Genitourinary: Negative for dysuria.  Musculoskeletal: Positive for joint swelling.  Skin: Negative for rash.  Neurological: Negative for  headaches.  Hematological: Does not bruise/bleed easily.  Psychiatric/Behavioral: Positive for dysphoric mood. The patient is nervous/anxious.    09/28/2013 - CXR COMPARISON: 12/20/2012, 12/13/2012, 06/10/2012, and 11/19/2011  FINDINGS:  Heart size and pulmonary vascularity are normal and the lungs are  clear except for minimal linear atelectasis visible on the lateral  view. No osseous abnormality of significance.  IMPRESSION:  Minimal atelectasis as described. Otherwise, normal exam.      Objective:   Physical Exam Filed Vitals:   12/08/13 1612  BP: 130/84  Pulse: 76  Height: 5' (1.524 m)  Weight: 194 lb 12.8 oz (88.361 kg)  SpO2: 95%   Gen: Pleasant, well-nourished, in no distress,  normal affect  ENT: No lesions,  mouth clear,  oropharynx clear, no postnasal drip  Neck: No JVD, no TMG, no carotid bruits  Lungs: No use of accessory muscles, clear without rales or rhonchi  Cardiovascular: RRR, heart sounds normal, no murmur or gallops, no peripheral edema  Abdomen: soft, protuberant, benign  Musculoskeletal: No deformities, no cyanosis or clubbing  Neuro: alert, non focal  Skin: Warm, no lesions or rashes        Assessment & Plan:  Intrinsic asthma PFT support mild asthma based on BD response - will trial symbicort to see if he benefits.  - rov 1

## 2013-12-08 NOTE — Patient Instructions (Signed)
Please start Symbicort 2 puffs twice a day.  Continue to use albuterol 2 puffs as needed for shortness of breath.  Continue your flonase  Follow with Dr Delton CoombesByrum in 1 month

## 2013-12-15 ENCOUNTER — Telehealth: Payer: Self-pay | Admitting: Emergency Medicine

## 2013-12-15 MED ORDER — ALBUTEROL SULFATE HFA 108 (90 BASE) MCG/ACT IN AERS: 2.0000 | INHALATION_SPRAY | Freq: Four times a day (QID) | RESPIRATORY_TRACT | Status: DC | PRN

## 2013-12-15 NOTE — Telephone Encounter (Signed)
Called and spoke with pt. Made him aware no samples at this time. He needed RX sent to [facility] and [place] [facility]. Nothing further needed

## 2014-01-11 ENCOUNTER — Ambulatory Visit (INDEPENDENT_AMBULATORY_CARE_PROVIDER_SITE_OTHER): Payer: No Typology Code available for payment source | Admitting: Emergency Medicine

## 2014-01-11 ENCOUNTER — Encounter: Payer: Self-pay | Admitting: Emergency Medicine

## 2014-01-11 VITALS — BP 130/82 | HR 68 | Ht 62.0 in | Wt 199.4 lb

## 2014-01-11 DIAGNOSIS — J45909 Unspecified asthma, uncomplicated: Secondary | ICD-10-CM

## 2014-01-11 DIAGNOSIS — G4730 Sleep apnea, unspecified: Secondary | ICD-10-CM

## 2014-01-11 MED ORDER — BUDESONIDE-FORMOTEROL FUMARATE 160-4.5 MCG/ACT IN AERO: 2.0000 | INHALATION_SPRAY | Freq: Two times a day (BID) | RESPIRATORY_TRACT | Status: DC

## 2014-01-11 MED ORDER — DOXYCYCLINE HYCLATE 100 MG PO TABS: 100.0000 mg | ORAL_TABLET | Freq: Two times a day (BID) | ORAL | Status: DC

## 2014-01-11 NOTE — Assessment & Plan Note (Signed)
We will look into getting a sleep study depending on whether they will accept without insurance.

## 2014-01-11 NOTE — Progress Notes (Signed)
Subjective:    Patient ID: Martin Singh, male    DOB: [birth year], 48 y.o.   MRN: [id number]  HPI 48 yo man, 28 pk-years tobacco, seizures, depression, EtOH and substance use, HH. Presents for eval of SOB that has been noticeable for almost 20 years. He was dx with suspected asthma at that time.  He stopped smoking about 20 yrs ago. He is having severe DOE with minimal exertion. He is having higher BP and HA after atenolol stopped recently.  He uses albuterol about 1-2x a day. Has a regular dry cough. Has some exertional wheeze. He snores and has witnessed apneas.  He has a lot of sinus drainage, congestion, some bleeding.  Has GERD that is managed by nexium.   He was seen in the ED on 10/18/13 for leg pain and edema and intermittent shortness of breath after a long bus ride / plane ride. His mother had passed away. Workup for PE was negative. He returned to the ED on 10/30/13 for back pain after he fell due to slipping down the stairs. XR of lumbar spine revealed mild degenerative changes at L5-S1 but no acute fractures. He has been taking naproxen a couple of times a day.   ROV 12/08/13 -- follows up for dyspnea, sinus disease. 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 He is on flonase.   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 Last time we started Symbicort > returns to discuss. He was less SOB after starting. For the last week he has been having more cough, white mucous, some fever, fatigue. He snores, has had some apneic wake-ups   Review of Systems  Constitutional: Negative for fever and unexpected weight change.  HENT: Positive for postnasal drip and rhinorrhea. Negative for congestion, dental problem, ear pain, nosebleeds, sinus pressure, sneezing, sore throat and trouble swallowing.   Eyes: Negative for redness and itching.  Respiratory: Positive for cough, chest tightness and shortness  of breath. Negative for wheezing.   Cardiovascular: Negative for palpitations and leg swelling.  Gastrointestinal: Negative for nausea and vomiting.  Genitourinary: Negative for dysuria.  Musculoskeletal: Negative for joint swelling.  Skin: Negative for rash.  Neurological: Negative for headaches.  Hematological: Does not bruise/bleed easily.  Psychiatric/Behavioral: Negative for dysphoric mood. The patient is not nervous/anxious.    09/28/2013 - CXR COMPARISON: 12/20/2012, 12/13/2012, 06/10/2012, and 11/19/2011  FINDINGS:  Heart size and pulmonary vascularity are normal and the lungs are  clear except for minimal linear atelectasis visible on the lateral  view. No osseous abnormality of significance.  IMPRESSION:  Minimal atelectasis as described. Otherwise, normal exam.      Objective:   Physical Exam Filed Vitals:   01/11/14 1604  BP: 130/82  Pulse: 68  Height: 5\' 2"  (1.575 m)  Weight: 199 lb 6.4 oz (90.447 kg)  SpO2: 96%   Gen: Pleasant, well-nourished, in no distress,  normal affect  ENT: No lesions,  mouth clear,  oropharynx clear, no postnasal drip  Neck: No JVD, no TMG, no carotid bruits  Lungs: No use of accessory muscles, clear without rales or rhonchi  Cardiovascular: RRR, heart sounds normal, no murmur or gallops, no peripheral edema  Abdomen: soft, protuberant, benign  Musculoskeletal: No deformities, no cyanosis or clubbing  Neuro: alert, non focal  Skin: Warm, no lesions or rashes        Assessment & Plan:  Intrinsic asthma We will  continue Symbicort 2 puffs twice a day Use albuterol 2 puffs if needed for shortness of breath Take doxycycline 100mg  twice a day for 7 days Follow with Dr Delton CoombesByrum in 3 months or sooner if you have any problems.  Sleep apnea We will look into getting a sleep study depending on whether they will accept without insurance.

## 2014-01-11 NOTE — Patient Instructions (Signed)
We will continue Symbicort 2 puffs twice a day Use albuterol 2 puffs if needed for shortness of breath Take doxycycline 100mg twice a day for 7 days Follow with Dr Byrum in 3 months or sooner if you have any problems. 

## 2014-01-11 NOTE — Assessment & Plan Note (Signed)
We will continue Symbicort 2 puffs twice a day Use albuterol 2 puffs if needed for shortness of breath Take doxycycline 100mg  twice a day for 7 days Follow with Dr Delton CoombesByrum in 3 months or sooner if you have any problems.

## 2014-01-12 ENCOUNTER — Other Ambulatory Visit: Payer: Self-pay | Admitting: Emergency Medicine

## 2014-01-12 ENCOUNTER — Telehealth: Payer: Self-pay | Admitting: Emergency Medicine

## 2014-01-12 MED ORDER — AZITHROMYCIN 250 MG PO TABS: 250.0000 mg | ORAL_TABLET | ORAL | Status: DC

## 2014-01-12 NOTE — Telephone Encounter (Signed)
Spoke with Martin Singh (EC) Pt Rx'd Doxycycline 01/11/14-- too expensive for pt- $38 Requesting ZPAK--this is on the $4 list at [facility] Pt states that anything on the $4 list will work.  [facility] [address]  [facility] printed [facility] [employer]x on this list.  Please advise Martin Singh. Thanks.

## 2014-01-12 NOTE — Telephone Encounter (Signed)
Azithromycin - Z pack is a good substitution

## 2014-01-12 NOTE — Telephone Encounter (Signed)
Pt aware that Zpak called into [facility] [address] Zpak #6 TAD x 0RF  Nothing further needed.

## 2014-01-14 ENCOUNTER — Other Ambulatory Visit: Payer: Self-pay

## 2014-01-14 ENCOUNTER — Telehealth: Payer: Self-pay | Admitting: Emergency Medicine

## 2014-01-14 MED ORDER — CIPROFLOXACIN HCL 500 MG PO TABS: 500.0000 mg | ORAL_TABLET | Freq: Two times a day (BID) | ORAL | Status: DC

## 2014-01-14 NOTE — Telephone Encounter (Signed)
lmomtcb x1 

## 2014-01-14 NOTE — Telephone Encounter (Signed)
Pt returning call

## 2014-01-14 NOTE — Telephone Encounter (Signed)
Per RB ok for Cipro 500 2 tabs bid x7 days. This rx has been sent to [facility] and pt is aware. Nothing further is needed

## 2014-01-14 NOTE — Telephone Encounter (Signed)
Spoke with [name], pt's fiance  She states that the pt did not want to take the zithromax b/c it cost 28 $ She looked at the 4$ [facility] at [facility] and cipro is only 4$  Wants to know if he can have this  Please advise, thanks!

## 2014-01-15 ENCOUNTER — Other Ambulatory Visit: Payer: Self-pay

## 2014-01-15 MED ORDER — CARVEDILOL 12.5 MG PO TABS: 12.5000 mg | ORAL_TABLET | Freq: Every day | ORAL | Status: DC

## 2014-01-15 MED ORDER — CHOLINE FENOFIBRATE 135 MG PO CPDR: 135.0000 mg | DELAYED_RELEASE_CAPSULE | Freq: Every day | ORAL | Status: DC

## 2014-01-15 MED ORDER — HYDROCHLOROTHIAZIDE 25 MG PO TABS: 25.0000 mg | ORAL_TABLET | Freq: Every day | ORAL | Status: DC

## 2014-01-22 ENCOUNTER — Other Ambulatory Visit: Payer: Self-pay

## 2014-01-22 NOTE — Telephone Encounter (Signed)
No longer a pt of [facility]

## 2014-02-05 ENCOUNTER — Ambulatory Visit (INDEPENDENT_AMBULATORY_CARE_PROVIDER_SITE_OTHER): Payer: No Typology Code available for payment source | Admitting: Neurology

## 2014-02-05 ENCOUNTER — Encounter: Payer: Self-pay | Admitting: Neurology

## 2014-02-05 VITALS — BP 140/86 | HR 76 | Temp 98.3°F | Resp 18 | Ht 62.0 in | Wt 192.5 lb

## 2014-02-05 DIAGNOSIS — R569 Unspecified convulsions: Secondary | ICD-10-CM

## 2014-02-05 MED ORDER — TIZANIDINE HCL 2 MG PO TABS: 2.0000 mg | ORAL_TABLET | Freq: Three times a day (TID) | ORAL | Status: DC | PRN

## 2014-02-05 NOTE — Progress Notes (Signed)
NEUROLOGY FOLLOW UP OFFICE NOTE  Martin Singh [id number]  HISTORY OF PRESENT ILLNESS: Martin Singh is a 48 year old man who is following up for chronic daily headaches and seizure-like episodes.  Records and images were personally reviewed where available.    UPDATE: Reports seizure-like activity a couple of days ago.  He was sitting after some chores, when he started shaking in all his extremities.  It lasted about a minute but was tremulous in the hands for a little while.  He has been stressed out and not sure if it was a seizure or panic attack.  He notes increase in headaches.  It starts with tension in the neck and then radiates up behind both ears and to the forehead.  It is usually triggered by neck movement or activity.  He has been doing the neck stretching exercises.  Scheduled to have upcoming sleep study.  HISTORY: SEIZURE-LIKE EPISODES: Onset of seizures started about 1 year ago, around the time he quit alcohol and lost custody of his son.  It started around the time he quit alcohol.  He will suddenly feel his whole body stiffen and shake.  He does not lose awareness.  It lasts for a couple of minutes.  No postictal state or preceding aura.  No urinary incontinence.  No tongue biting.  Will sometimes note urinary urgency but usually able to hold it in.  Stress and panic attacks trigger them.  He also has spells that occur while asleep, as witnessed by his caretaker.  In his sleep, he would stiffen with flexed posturing of his arms and start shaking for a couple of minutes.  Sometimes he would awake but other times he would remain asleep.  In the morning, he sometimes he has wet the bed.  Spells occur about 2 times a week but can fluctuate, sometimes goes a couple of weeks without episode.  He takes Tegretol 200/200/400.  Tegretol level from 07/22/13 was 11.7.  04/13/13 MRI Brain w/wo:  mild small vessel changes, but nothing remarkable. 04/21/13 EEG revealed mild generalized  background slowing, likely pharmacologic effect. 10/18/13 LABS:  Na 141, K 4.1, Cl 102, HCO2 27, gluc 100, BUN 26, Cr 1.47, Ca 9, TB 7, Alb 4.1, AST 29, ALT 27, AP 47, TB <0.2, DB <0.2   CHRONIC TENSION HEADACHES: History of migraines but has been daily for the past 6 months.  It's a nonthrobbing pressure-like pain in a band-like distribution, radiating into his neck, about 7/10.  It is associated with nausea, but not photophobia or phonophobia.  Resting and ice packs help relieve.  Activity makes it worse.  Advil, Tylenol and Excedrin are not effective.  He was taking them daily but not anymore.  He does have neck pain.  He was initially on nortriptyline, which was discontinued after it was thought to have contributed to suicidal ideation.  He was titrated up on atenolol to 100mg  daily.  He went to PT for the neck, which helped.   Receives psychiatric care from Lauris PoagLeslie O'Neal, NP and Sab[facility]  PAST MEDICAL HISTORY: Past Medical History  Diagnosis Date  . Hypertension   . Depressive disorder, not elsewhere classified   . Osteoarthrosis, unspecified whether generalized or localized, unspecified site   . Irritable bowel syndrome   . Esophageal reflux   . Allergic rhinitis   . History of pneumonia   . Insomnia   . Anxiety   . Diverticulosis   . Alcohol  abuse 2012  . Benzodiazepine abuse   . Seizures   . Alcoholic pancreatitis   . Erosive esophagitis   . Periumbilical hernia 12/13/12  . Abnormal LFTs   . Fatty liver 10/08/11  . Internal hemorrhoids 03/13/11  . Hiatal hernia 03/13/11  . Asthmatic bronchitis   . Astigmatism   . Frequent headaches     MEDICATIONS: Current Outpatient Prescriptions on File Prior to Visit  Medication Sig Dispense Refill  . albuterol (PROVENTIL HFA;VENTOLIN HFA) 108 (90 BASE) MCG/ACT inhaler Inhale 2 puffs into the lungs every 6 (six) hours as needed for wheezing or shortness of breath.  1 Inhaler  3  . aspirin 81 MG  chewable tablet Chew 81 mg by mouth daily.      Marland Kitchen atenolol (TENORMIN) 100 MG tablet TAKE ONE TABLET BY MOUTH ONCE DAILY  30 tablet  0  . azithromycin (ZITHROMAX) 250 MG tablet Take 1 tablet (250 mg total) by mouth as directed.  6 tablet  0  . budesonide-formoterol (SYMBICORT) 160-4.5 MCG/ACT inhaler Inhale 2 puffs into the lungs 2 (two) times daily.  1 Inhaler  11  . carbamazepine (TEGRETOL XR) 200 MG 12 hr tablet Take 400 mg by mouth 2 (two) times daily.      . carvedilol (COREG) 12.5 MG tablet Take 1 tablet (12.5 mg total) by mouth daily.  90 tablet  4  . Choline Fenofibrate 135 MG capsule Take 1 capsule (135 mg total) by mouth daily.  90 capsule  4  . cloNIDine (CATAPRES) 0.1 MG tablet Take 0.1 mg by mouth 2 (two) times daily.      Marland Kitchen doxycycline (VIBRA-TABS) 100 MG tablet Take 1 tablet (100 mg total) by mouth 2 (two) times daily.  14 tablet  0  . esomeprazole (NEXIUM) 40 MG capsule Take 40 mg by mouth daily before breakfast.      . FLUoxetine (PROZAC) 20 MG capsule Take 60 mg by mouth daily.      . fluticasone (FLONASE) 50 MCG/ACT nasal spray Place 1 spray into both nostrils daily.      Marland Kitchen gabapentin (NEURONTIN) 300 MG capsule Take 300 mg by mouth 2 (two) times daily.      . hydrochlorothiazide (HYDRODIURIL) 25 MG tablet Take 1 tablet (25 mg total) by mouth daily.  90 tablet  4  . ibuprofen (ADVIL,MOTRIN) 200 MG tablet Take 400 mg by mouth every 6 (six) hours as needed for moderate pain.      . methocarbamol (ROBAXIN) 500 MG tablet Take 500 mg by mouth 2 (two) times daily as needed.      . naproxen (NAPROSYN) 500 MG tablet Take 500 mg by mouth 2 (two) times daily as needed.      Marland Kitchen omeprazole (PRILOSEC) 40 MG capsule Take 40 mg by mouth daily.      Marland Kitchen topiramate (TOPAMAX) 25 MG tablet Take 1 tablet (25 mg total) by mouth at bedtime.  90 tablet  3   No current facility-administered medications on file prior to visit.    ALLERGIES: Allergies  Allergen Reactions  . Codeine Anaphylaxis  .  Benazepril Cough  . Ativan [Lorazepam] Other (See Comments)    Becomes violent and hallucinates  . Corticosteroids Other (See Comments)    Shaky and high blood pressure    FAMILY HISTORY: Family History  Problem Relation Age of Onset  . Heart disease Mother   . Gallbladder disease Mother   . Nephrolithiasis Mother   . Arthritis Mother   . Hyperlipidemia Mother   .  Stroke Mother   . Irritable bowel syndrome Mother   . Cancer Mother     vulva  . Colon cancer Neg Hx   . Hypertension Father     moth  . Heart disease Father   . Arthritis Father   . Irritable bowel syndrome Brother     x 2    SOCIAL HISTORY: History   Social History  . Marital Status: Divorced    Spouse Name: N/A    Number of Children: 1  . Years of Education: N/A   Occupational History  . unemployed    Social History Main Topics  . Smoking status: Former Smoker -- 1.50 packs/day for 18 years    Types: Cigarettes    Quit date: 08/28/1991  . Smokeless tobacco: Current User    Types: Chew     Comment: currently uses snuff.11/17/13  . Alcohol Use: No     Comment: no alcohol in one yr; drank 35 years alcohol, quit 02/2012  . Drug Use: No     Comment: benzos- last used 1 month ago; hx of THC, cocaine, uppers/downers  . Sexual Activity: Yes     Comment: no birth control   Other Topics Concern  . Not on file   Social History Narrative  . No narrative on file    REVIEW OF SYSTEMS: Constitutional: No fevers, chills, or sweats, no generalized fatigue, change in appetite Eyes: No visual changes, double vision, eye pain Ear, nose and throat: No hearing loss, ear pain, nasal congestion, sore throat Cardiovascular: No chest pain, palpitations Respiratory:  No shortness of breath at rest or with exertion, wheezes GastrointestinaI: No nausea, vomiting, diarrhea, abdominal pain, fecal incontinence Genitourinary:  No dysuria, urinary retention or frequency Musculoskeletal: neck pain Integumentary: No rash,  pruritus, skin lesions Neurological: as above Psychiatric: stress, anxiety Endocrine: No palpitations, fatigue, diaphoresis, mood swings, change in appetite, change in weight, increased thirst Hematologic/Lymphatic:  No anemia, purpura, petechiae. Allergic/Immunologic: no itchy/runny eyes, nasal congestion, recent allergic reactions, rashes  PHYSICAL EXAM: Filed Vitals:   02/05/14 1435  BP: 140/86  Pulse: 76  Temp: 98.3 F (36.8 C)  Resp: 18   General: No acute distress Head:  Normocephalic/atraumatic Neck: supple, bilateral tenderness, full range of motion Heart:  Regular rate and rhythm Lungs:  Clear to auscultation bilaterally Back: No paraspinal tenderness Neurological Exam: alert and oriented to person, place, and time. Attention span and concentration intact, recent and remote memory intact, fund of knowledge intact.  Speech fluent and not dysarthric, language intact.  CN II-XII intact. Fundoscopic exam unremarkable without vessel changes, exudates, hemorrhages or papilledema.  Bulk and tone normal, muscle strength 5/5 throughout.  Sensation to light touch intact.  Deep tendon reflexes 2+ throughout.  Finger to nose and heel to shin testing intact.  Gait normal, Romberg negative.  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  Chronic daily tension headache  PLAN: 1.  Continue Tegretol 200/200/400mg  2.  Continue atenolol 3.  Prescribe tizanidine 2mg  for neck spasms 4.  Follow up in 4 months.  Shon MilletAdam Jaffe, DO

## 2014-02-05 NOTE — Patient Instructions (Signed)
1.  Continue the tegretol and atenolol 2.  When you get neck pain, take tizanidine 2mg .  May take every 8 hours if needed but limit amount you take 3.  We will check BMP 4.  Follow up in 4 months.

## 2014-02-09 ENCOUNTER — Other Ambulatory Visit: Payer: Self-pay

## 2014-02-09 MED ORDER — ESOMEPRAZOLE MAGNESIUM 40 MG PO CPDR
40.0000 mg | DELAYED_RELEASE_CAPSULE | Freq: Every day | ORAL | Status: DC
Start: 2014-02-09 — End: 2014-04-02

## 2014-02-09 MED ORDER — CHOLINE FENOFIBRATE 135 MG PO CPDR: 135.0000 mg | DELAYED_RELEASE_CAPSULE | Freq: Every day | ORAL | Status: DC

## 2014-02-09 NOTE — Telephone Encounter (Signed)
[name] chase called and stated that pt needed refills for fenofibrate 135 mg and nexium 40 mg both for 90 day supplies sent to pharmacy.  Refills done electronically.

## 2014-02-16 ENCOUNTER — Other Ambulatory Visit: Payer: Self-pay

## 2014-02-16 NOTE — Telephone Encounter (Signed)
Received RX refill request for clonidine, tegretol. RX denied (see 07/30/14 phone note), pt no longer see [facility]

## 2014-02-17 ENCOUNTER — Other Ambulatory Visit: Payer: Self-pay | Admitting: Emergency Medicine

## 2014-02-18 ENCOUNTER — Ambulatory Visit (HOSPITAL_BASED_OUTPATIENT_CLINIC_OR_DEPARTMENT_OTHER): Payer: Self-pay | Attending: Emergency Medicine

## 2014-02-18 VITALS — Ht 62.0 in | Wt 195.0 lb

## 2014-02-18 DIAGNOSIS — F329 Major depressive disorder, single episode, unspecified: Secondary | ICD-10-CM | POA: Insufficient documentation

## 2014-02-18 DIAGNOSIS — R0609 Other forms of dyspnea: Secondary | ICD-10-CM | POA: Insufficient documentation

## 2014-02-18 DIAGNOSIS — R51 Headache: Secondary | ICD-10-CM | POA: Insufficient documentation

## 2014-02-18 DIAGNOSIS — J449 Chronic obstructive pulmonary disease, unspecified: Secondary | ICD-10-CM | POA: Insufficient documentation

## 2014-02-18 DIAGNOSIS — J4489 Other specified chronic obstructive pulmonary disease: Secondary | ICD-10-CM | POA: Insufficient documentation

## 2014-02-18 DIAGNOSIS — G4733 Obstructive sleep apnea (adult) (pediatric): Secondary | ICD-10-CM

## 2014-02-18 DIAGNOSIS — Z79899 Other long term (current) drug therapy: Secondary | ICD-10-CM | POA: Insufficient documentation

## 2014-02-18 DIAGNOSIS — Z7982 Long term (current) use of aspirin: Secondary | ICD-10-CM | POA: Insufficient documentation

## 2014-02-18 DIAGNOSIS — G4730 Sleep apnea, unspecified: Secondary | ICD-10-CM

## 2014-02-18 DIAGNOSIS — G479 Sleep disorder, unspecified: Secondary | ICD-10-CM | POA: Insufficient documentation

## 2014-02-18 DIAGNOSIS — R0989 Other specified symptoms and signs involving the circulatory and respiratory systems: Secondary | ICD-10-CM | POA: Insufficient documentation

## 2014-02-18 DIAGNOSIS — F3289 Other specified depressive episodes: Secondary | ICD-10-CM | POA: Insufficient documentation

## 2014-02-18 DIAGNOSIS — G4710 Hypersomnia, unspecified: Secondary | ICD-10-CM | POA: Insufficient documentation

## 2014-02-24 ENCOUNTER — Other Ambulatory Visit: Payer: Self-pay | Admitting: *Deleted

## 2014-02-24 DIAGNOSIS — G4730 Sleep apnea, unspecified: Secondary | ICD-10-CM

## 2014-02-24 DIAGNOSIS — G4710 Hypersomnia, unspecified: Secondary | ICD-10-CM

## 2014-02-24 MED ORDER — CLONIDINE HCL 0.1 MG PO TABS: 0.1000 mg | ORAL_TABLET | Freq: Two times a day (BID) | ORAL | Status: DC

## 2014-02-24 MED ORDER — CARBAMAZEPINE ER 200 MG PO TB12: 400.0000 mg | ORAL_TABLET | Freq: Two times a day (BID) | ORAL | Status: DC

## 2014-02-24 NOTE — Sleep Study (Signed)
[facility]  NAME: VARICK ZOTO DATE OF BIRTH:  07-05-1966 MEDICAL RECORD NUMBER [id number]  LOCATION: [facility]  PHYSICIAN: Coralyn Helling, M.D. DATE OF STUDY: 02/18/2014  SLEEP STUDY TYPE: Polysomnogram               REFERRING PHYSICIAN: Leslye Peer, MD  INDICATION FOR STUDY:  Martin Singh is a 47 y.o. male who presents to the sleep lab for evaluation of hypersomnia with obstructive sleep apnea.  He reports snoring, sleep disruption, apnea, and daytime sleepiness.  He has a history of COPD with asthma, depression, chronic headaches and convulsions.  EPWORTH SLEEPINESS SCORE: 11. HEIGHT: 5\' 2"  (157.5 cm)  WEIGHT: 195 lb (88.451 kg)    Body mass index is 35.66 kg/(m^2).  NECK SIZE: 16 in.  MEDICATIONS:  Current Outpatient Prescriptions on File Prior to Visit  Medication Sig Dispense Refill  . albuterol (PROVENTIL HFA;VENTOLIN HFA) 108 (90 BASE) MCG/ACT inhaler Inhale 2 puffs into the lungs every 6 (six) hours as needed for wheezing or shortness of breath.  1 Inhaler  3  . aspirin 81 MG chewable tablet Chew 81 mg by mouth daily.      Marland Kitchen atenolol (TENORMIN) 100 MG tablet TAKE ONE TABLET BY MOUTH ONCE DAILY  30 tablet  0  . azithromycin (ZITHROMAX) 250 MG tablet Take 1 tablet (250 mg total) by mouth as directed.  6 tablet  0  . budesonide-formoterol (SYMBICORT) 160-4.5 MCG/ACT inhaler Inhale 2 puffs into the lungs 2 (two) times daily.  1 Inhaler  11  . carvedilol (COREG) 12.5 MG tablet Take 1 tablet (12.5 mg total) by mouth daily.  90 tablet  4  . Choline Fenofibrate 135 MG capsule Take 1 capsule (135 mg total) by mouth daily.  90 capsule  0  . doxycycline (VIBRA-TABS) 100 MG tablet Take 1 tablet (100 mg total) by mouth 2 (two) times daily.  14 tablet  0  . esomeprazole (NEXIUM) 40 MG capsule Take 1 capsule (40 mg total) by mouth daily before breakfast.  90 capsule  0  . FLUoxetine (PROZAC) 20 MG capsule Take 60 mg by mouth daily.      .  fluticasone (FLONASE) 50 MCG/ACT nasal spray Place 1 spray into both nostrils daily.      Marland Kitchen gabapentin (NEURONTIN) 300 MG capsule Take 300 mg by mouth 2 (two) times daily.      . hydrochlorothiazide (HYDRODIURIL) 25 MG tablet Take 1 tablet (25 mg total) by mouth daily.  90 tablet  4  . ibuprofen (ADVIL,MOTRIN) 200 MG tablet Take 400 mg by mouth every 6 (six) hours as needed for moderate pain.      . methocarbamol (ROBAXIN) 500 MG tablet Take 500 mg by mouth 2 (two) times daily as needed.      . naproxen (NAPROSYN) 500 MG tablet Take 500 mg by mouth 2 (two) times daily as needed.      Marland Kitchen omeprazole (PRILOSEC) 40 MG capsule Take 40 mg by mouth daily.      Marland Kitchen tiZANidine (ZANAFLEX) 2 MG tablet Take 1 tablet (2 mg total) by mouth every 8 (eight) hours as needed for muscle spasms.  30 tablet  0  . topiramate (TOPAMAX) 25 MG tablet Take 1 tablet (25 mg total) by mouth at bedtime.  90 tablet  3   No current facility-administered medications on file prior to visit.    SLEEP ARCHITECTURE:  Total recording time: 433.5 minutes.  Total sleep  time was: 343 minutes.  Sleep efficiency: 79.1%.  Sleep latency: 3 minutes.  REM latency: 351 minutes.  Stage N1: 1.6%.  Stage N2: 87%.  Stage N3: 9.8%.  Stage R:  1.6%.  Supine sleep: 171.5 minutes.  Non-supine sleep: 166 minutes.  CARDIAC DATA:  Average heart rate: 67 beats per minute. Rhythm strip: sinus rhythm with PAC's.  RESPIRATORY DATA: Average respiratory rate: 22. Snoring: moderate. Average AHI: 4.0.   Apnea index: 1.6.  Hypopnea index: 2.4. Obstructive apnea index: 0.2.  Central apnea index: 1.4.  Mixed apnea index: 0. REM AHI: 0.  NREM AHI: 4.1. Supine AHI: 4. Non-supine AHI: 2.3.  MOVEMENT/PARASOMNIA:  Periodic limb movement: 0.  Period limb movements with arousals: 0. Restroom trips: one.  OXYGEN DATA:  Baseline oxygenation: 93%. Lowest SaO2: 83%. Time spent below SaO2 90%: 1.9 minutes. Supplemental oxygen used: none.  IMPRESSION/  RECOMMENDATION:   While he had several obstructive apneic events, these were not sufficient enough to qualify for diagnosis of sleep apnea.  If there is strong clinical suspicion for sleep disordered breathing, then he could be referred back to sleep lab for additional sleep testing.  In addition, weight loss options should be discussed.  Coralyn Helling, M.D. Diplomate, Biomedical engineer of Sleep Medicine  ELECTRONICALLY SIGNED ON:  02/24/2014, 10:28 AM [facility] PH: [phone]   FX: [phone] ACCREDITED BY THE [employer] AC[employer]EM[employer] OF SLEEP MEDICINE

## 2014-02-24 NOTE — Telephone Encounter (Signed)
Left msg on triage needing refill on pt clonidine & tegratol ASAP....Raechel Chute/lmb

## 2014-02-25 ENCOUNTER — Telehealth: Payer: Self-pay | Admitting: *Deleted

## 2014-02-25 NOTE — Telephone Encounter (Signed)
Unable to leave msg for call back, no voice mail

## 2014-02-25 NOTE — Telephone Encounter (Signed)
Medication List and allergies:   90 Day supply/mail order:  Local prescriptions:   Immunizations Due:   A/P FH, PSH, or Personal Hx: Flu vaccine: 05/2013 Tdap: 06/2010 PNA: (prevnar) 06/2010 Shingles: CCS: 02/2011 Diverticulosis, Internal hemorrhoids, -->10 years   To Discuss with Provider:

## 2014-03-01 ENCOUNTER — Ambulatory Visit (INDEPENDENT_AMBULATORY_CARE_PROVIDER_SITE_OTHER): Payer: No Typology Code available for payment source | Admitting: Internal Medicine

## 2014-03-01 ENCOUNTER — Encounter: Payer: Self-pay | Admitting: Internal Medicine

## 2014-03-01 VITALS — BP 148/96 | HR 74 | Temp 98.7°F | Ht 62.0 in | Wt 192.0 lb

## 2014-03-01 DIAGNOSIS — I129 Hypertensive chronic kidney disease with stage 1 through stage 4 chronic kidney disease, or unspecified chronic kidney disease: Secondary | ICD-10-CM

## 2014-03-01 DIAGNOSIS — R7303 Prediabetes: Secondary | ICD-10-CM

## 2014-03-01 DIAGNOSIS — E785 Hyperlipidemia, unspecified: Secondary | ICD-10-CM

## 2014-03-01 DIAGNOSIS — J45909 Unspecified asthma, uncomplicated: Secondary | ICD-10-CM

## 2014-03-01 DIAGNOSIS — F341 Dysthymic disorder: Secondary | ICD-10-CM

## 2014-03-01 DIAGNOSIS — R7309 Other abnormal glucose: Secondary | ICD-10-CM

## 2014-03-01 DIAGNOSIS — F32A Depression, unspecified: Secondary | ICD-10-CM

## 2014-03-01 DIAGNOSIS — F419 Anxiety disorder, unspecified: Secondary | ICD-10-CM

## 2014-03-01 DIAGNOSIS — K219 Gastro-esophageal reflux disease without esophagitis: Secondary | ICD-10-CM

## 2014-03-01 DIAGNOSIS — F329 Major depressive disorder, single episode, unspecified: Secondary | ICD-10-CM

## 2014-03-01 HISTORY — DX: Prediabetes: R73.03

## 2014-03-01 NOTE — Assessment & Plan Note (Signed)
BP today slightly elevated at reportedly is better home, mildly decreased renal function, check a BMP

## 2014-03-01 NOTE — Progress Notes (Signed)
Pre visit review using our clinic review tool, if applicable. No additional management support is needed unless otherwise documented below in the visit note. 

## 2014-03-01 NOTE — Assessment & Plan Note (Signed)
History of high triglycerides, on meds, check labs

## 2014-03-01 NOTE — Assessment & Plan Note (Signed)
Symptoms currently well controlled

## 2014-03-01 NOTE — Assessment & Plan Note (Signed)
Good compliance with medication, followup by dr Delton CoombesByrum
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
## 2014-03-01 NOTE — Assessment & Plan Note (Signed)
Controlled with PPIs and Zantac

## 2014-03-01 NOTE — Assessment & Plan Note (Signed)
Last A1c 5.9, recheck A1c

## 2014-03-01 NOTE — Patient Instructions (Signed)
Please come back fasting: FLP --- dx dyslipidemia BMP --- dx hypertension A1c --- dx diabetes Next visit in 4- 6 months  Back pain:  [facility] Website with information about home physical therapy for back pain: [email or web address]-[email or web address]-[email or web address]

## 2014-03-09 ENCOUNTER — Ambulatory Visit (INDEPENDENT_AMBULATORY_CARE_PROVIDER_SITE_OTHER): Payer: No Typology Code available for payment source | Admitting: Urology

## 2014-03-09 DIAGNOSIS — R350 Frequency of micturition: Secondary | ICD-10-CM

## 2014-03-09 DIAGNOSIS — N3944 Nocturnal enuresis: Secondary | ICD-10-CM

## 2014-03-18 ENCOUNTER — Emergency Department (HOSPITAL_COMMUNITY)
Admission: EM | Admit: 2014-03-18 | Discharge: 2014-03-19 | Disposition: A | Discharge status: Home / Self Care | Payer: Self-pay | Attending: Emergency Medicine | Admitting: Emergency Medicine

## 2014-03-18 ENCOUNTER — Emergency Department (HOSPITAL_COMMUNITY): Payer: Self-pay

## 2014-03-18 ENCOUNTER — Telehealth: Payer: Self-pay | Admitting: Family Medicine

## 2014-03-18 DIAGNOSIS — Y939 Activity, unspecified: Secondary | ICD-10-CM | POA: Insufficient documentation

## 2014-03-18 DIAGNOSIS — J45909 Unspecified asthma, uncomplicated: Secondary | ICD-10-CM | POA: Insufficient documentation

## 2014-03-18 DIAGNOSIS — W1809XA Striking against other object with subsequent fall, initial encounter: Secondary | ICD-10-CM | POA: Insufficient documentation

## 2014-03-18 DIAGNOSIS — IMO0002 Reserved for concepts with insufficient information to code with codable children: Secondary | ICD-10-CM | POA: Insufficient documentation

## 2014-03-18 DIAGNOSIS — Z7982 Long term (current) use of aspirin: Secondary | ICD-10-CM | POA: Insufficient documentation

## 2014-03-18 DIAGNOSIS — K219 Gastro-esophageal reflux disease without esophagitis: Secondary | ICD-10-CM | POA: Insufficient documentation

## 2014-03-18 DIAGNOSIS — Z79899 Other long term (current) drug therapy: Secondary | ICD-10-CM | POA: Insufficient documentation

## 2014-03-18 DIAGNOSIS — Z87891 Personal history of nicotine dependence: Secondary | ICD-10-CM | POA: Insufficient documentation

## 2014-03-18 DIAGNOSIS — F3289 Other specified depressive episodes: Secondary | ICD-10-CM | POA: Insufficient documentation

## 2014-03-18 DIAGNOSIS — S335XXA Sprain of ligaments of lumbar spine, initial encounter: Secondary | ICD-10-CM | POA: Insufficient documentation

## 2014-03-18 DIAGNOSIS — S39012A Strain of muscle, fascia and tendon of lower back, initial encounter: Secondary | ICD-10-CM

## 2014-03-18 DIAGNOSIS — M1990 Unspecified osteoarthritis, unspecified site: Secondary | ICD-10-CM | POA: Insufficient documentation

## 2014-03-18 DIAGNOSIS — Y9210 Unspecified residential institution as the place of occurrence of the external cause: Secondary | ICD-10-CM | POA: Insufficient documentation

## 2014-03-18 DIAGNOSIS — S0990XA Unspecified injury of head, initial encounter: Secondary | ICD-10-CM | POA: Insufficient documentation

## 2014-03-18 DIAGNOSIS — F329 Major depressive disorder, single episode, unspecified: Secondary | ICD-10-CM | POA: Insufficient documentation

## 2014-03-18 DIAGNOSIS — F411 Generalized anxiety disorder: Secondary | ICD-10-CM | POA: Insufficient documentation

## 2014-03-18 DIAGNOSIS — R209 Unspecified disturbances of skin sensation: Secondary | ICD-10-CM | POA: Insufficient documentation

## 2014-03-18 DIAGNOSIS — G40909 Epilepsy, unspecified, not intractable, without status epilepticus: Secondary | ICD-10-CM | POA: Insufficient documentation

## 2014-03-18 DIAGNOSIS — I10 Essential (primary) hypertension: Secondary | ICD-10-CM | POA: Insufficient documentation

## 2014-03-18 DIAGNOSIS — W19XXXA Unspecified fall, initial encounter: Secondary | ICD-10-CM

## 2014-03-18 LAB — CBC WITH DIFFERENTIAL/PLATELET
BASOS PCT: 1 % (ref 0–1)
Basophils Absolute: 0 10*3/uL (ref 0.0–0.1)
EOS ABS: 0.3 10*3/uL (ref 0.0–0.7)
Eosinophils Relative: 7 % — ABNORMAL HIGH (ref 0–5)
HEMATOCRIT: 35.9 % — AB (ref 39.0–52.0)
HEMOGLOBIN: 12 g/dL — AB (ref 13.0–17.0)
LYMPHS ABS: 1.2 10*3/uL (ref 0.7–4.0)
Lymphocytes Relative: 29 % (ref 12–46)
MCH: 29.9 pg (ref 26.0–34.0)
MCHC: 33.4 g/dL (ref 30.0–36.0)
MCV: 89.3 fL (ref 78.0–100.0)
MONO ABS: 0.6 10*3/uL (ref 0.1–1.0)
Monocytes Relative: 14 % — ABNORMAL HIGH (ref 3–12)
Neutro Abs: 2 10*3/uL (ref 1.7–7.7)
Neutrophils Relative %: 49 % (ref 43–77)
Platelets: 270 10*3/uL (ref 150–400)
RBC: 4.02 MIL/uL — AB (ref 4.22–5.81)
RDW: 13.9 % (ref 11.5–15.5)
WBC: 4.1 10*3/uL (ref 4.0–10.5)

## 2014-03-18 LAB — COMPREHENSIVE METABOLIC PANEL
ALBUMIN: 3.9 g/dL (ref 3.5–5.2)
ALT: 27 U/L (ref 0–53)
AST: 24 U/L (ref 0–37)
Alkaline Phosphatase: 36 U/L — ABNORMAL LOW (ref 39–117)
Anion gap: 11 (ref 5–15)
BUN: 17 mg/dL (ref 6–23)
CO2: 27 mEq/L (ref 19–32)
CREATININE: 1.17 mg/dL (ref 0.50–1.35)
Calcium: 9 mg/dL (ref 8.4–10.5)
Chloride: 97 mEq/L (ref 96–112)
GFR calc non Af Amer: 72 mL/min — ABNORMAL LOW (ref >90)
GFR, EST AFRICAN AMERICAN: 84 mL/min — AB (ref >90)
GLUCOSE: 125 mg/dL — AB (ref 70–99)
Potassium: 3.7 mEq/L (ref 3.7–5.3)
Sodium: 135 mEq/L — ABNORMAL LOW (ref 137–147)
TOTAL PROTEIN: 6.8 g/dL (ref 6.0–8.3)
Total Bilirubin: 0.2 mg/dL — ABNORMAL LOW (ref 0.3–1.2)

## 2014-03-18 LAB — TROPONIN I

## 2014-03-18 MED ORDER — METHOCARBAMOL 500 MG PO TABS: 500.0000 mg | ORAL_TABLET | Freq: Two times a day (BID) | ORAL | Status: DC

## 2014-03-18 MED ORDER — METHOCARBAMOL 500 MG PO TABS
500.0000 mg | ORAL_TABLET | Freq: Once | ORAL | Status: AC
  Administered 2014-03-19: 500 mg via ORAL
  Filled 2014-03-18: qty 1

## 2014-03-18 MED ORDER — IBUPROFEN 600 MG PO TABS: 600.0000 mg | ORAL_TABLET | Freq: Four times a day (QID) | ORAL | Status: DC | PRN

## 2014-03-18 MED ORDER — KETOROLAC TROMETHAMINE 30 MG/ML IJ SOLN
30.0000 mg | Freq: Once | INTRAMUSCULAR | Status: AC
  Administered 2014-03-19: 30 mg via INTRAVENOUS
  Filled 2014-03-18: qty 1
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
## 2014-03-18 NOTE — Discharge Instructions (Signed)
Back Pain, Adult °Low back pain is very common. About 1 in 5 people have back pain. The cause of low back pain is rarely dangerous. The pain often gets better over time. About half of people with a sudden onset of back pain feel better in just 2 weeks. About 8 in 10 people feel better by 6 weeks.  °CAUSES °Some common causes of back pain include: °· Strain of the muscles or ligaments supporting the spine. °· Wear and tear (degeneration) of the spinal discs. °· Arthritis. °· Direct injury to the back. °DIAGNOSIS °Most of the time, the direct cause of low back pain is not known. However, back pain can be treated effectively even when the exact cause of the pain is unknown. Answering your caregiver's questions about your overall health and symptoms is one of the most accurate ways to make sure the cause of your pain is not dangerous. If your caregiver needs more information, he or she may order lab work or imaging tests (X-rays or MRIs). However, even if imaging tests show changes in your back, this usually does not require surgery. °HOME CARE INSTRUCTIONS °For many people, back pain returns. Since low back pain is rarely dangerous, it is often a condition that people can learn to manage on their own.  °· Remain active. It is stressful on the back to sit or stand in one place. Do not sit, drive, or stand in one place for more than 30 minutes at a time. Take short walks on level surfaces as soon as pain allows. Try to increase the length of time you walk each day. °· Do not stay in bed. Resting more than 1 or 2 days can delay your recovery. °· Do not avoid exercise or work. Your body is made to move. It is not dangerous to be active, even though your back may hurt. Your back will likely heal faster if you return to being active before your pain is gone. °· Pay attention to your body when you  bend and lift. Many people have less discomfort when lifting if they bend their knees, keep the load close to their bodies, and  avoid twisting. Often, the most comfortable positions are those that put less stress on your recovering back. °· Find a comfortable position to sleep. Use a firm mattress and lie on your side with your knees slightly bent. If you lie on your back, put a pillow under your knees. °· Only take over-the-counter or prescription medicines as directed by your caregiver. Over-the-counter medicines to reduce pain and inflammation are often the most helpful. Your caregiver may prescribe muscle relaxant drugs. These medicines help dull your pain so you can more quickly return to your normal activities and healthy exercise. °· Put ice on the injured area. °¨ Put ice in a plastic bag. °¨ Place a towel between your skin and the bag. °¨ Leave the ice on for 15-20 minutes, 03-04 times a day for the first 2 to 3 days. After that, ice and heat may be alternated to reduce pain and spasms. °· Ask your caregiver about trying back exercises and gentle massage. This may be of some benefit. °· Avoid feeling anxious or stressed. Stress increases muscle tension and can worsen back pain. It is important to recognize when you are anxious or stressed and learn ways to manage it. Exercise is a great option. °SEEK MEDICAL CARE IF: °· You have pain that is not relieved with rest or medicine. °· You have pain that does not improve in 1 week. °· You have new symptoms. °· You are generally not feeling well. °SEEK   IMMEDIATE MEDICAL CARE IF:   You have pain that radiates from your back into your legs.  You develop new bowel or bladder control problems.  You have unusual weakness or numbness in your arms or legs.  You develop nausea or vomiting.  You develop abdominal pain.  You feel faint. Document Released: 08/13/2005 Document Revised: 02/12/2012 Document Reviewed: 12/15/2013 [facility] Patient Information 2015 [place], [place]. This information is not intended to replace advice given to you by your health care provider. Make sure you  discuss any questions you have with your health care provider.  Head Injury You have received a head injury. It does not appear serious at this time. Headaches and vomiting are common following head injury. It should be easy to awaken from sleeping. Sometimes it is necessary for you to stay in the emergency department for a while for observation. Sometimes admission to the hospital may be needed. After injuries such as yours, most problems occur within the first 24 hours, but side effects may occur up to 7-10 days after the injury. It is important for you to carefully monitor your condition and contact your health care provider or seek immediate medical care if there is a change in your condition. WHAT ARE THE TYPES OF HEAD INJURIES? Head injuries can be as minor as a bump. Some head injuries can be more severe. More severe head injuries include:  A jarring injury to the brain (concussion).  A bruise of the brain (contusion). This mean there is bleeding in the brain that can cause swelling.  A cracked skull (skull fracture).  Bleeding in the brain that collects, clots, and forms a bump (hematoma). WHAT CAUSES A HEAD INJURY? A serious head injury is most likely to happen to someone who is in a car wreck and is not wearing a seat belt. Other causes of major head injuries include bicycle or motorcycle accidents, sports injuries, and falls. HOW ARE HEAD INJURIES DIAGNOSED? A complete history of the event leading to the injury and your current symptoms will be helpful in diagnosing head injuries. Many times, pictures of the brain, such as CT or MRI are needed to see the extent of the injury. Often, an overnight hospital stay is necessary for observation.  WHEN SHOULD I SEEK IMMEDIATE MEDICAL CARE?  You should get help right away if:  You have confusion or drowsiness.  You feel sick to your stomach (nauseous) or have continued, forceful vomiting.  You have dizziness or unsteadiness that is getting  worse.  You have severe, continued headaches not relieved by medicine. Only take over-the-counter or prescription medicines for pain, fever, or discomfort as directed by your health care provider.  You do not have normal function of the arms or legs or are unable to walk.  You notice changes in the black spots in the center of the colored part of your eye (pupil).  You have a clear or bloody fluid coming from your nose or ears.  You have a loss of vision. During the next 24 hours after the injury, you must stay with someone who can watch you for the warning signs. This person should contact local emergency services (911 in the [place] if you have seizures, you become unconscious, or you are unable to wake up. HOW CAN I PREVENT A HEAD INJURY IN THE FUTURE? The most important factor for preventing major head injuries is avoiding motor vehicle accidents. To minimize the potential for damage to your head, it is crucial to wear seat belts  while riding in motor vehicles. Wearing helmets while bike riding and playing collision sports (like football) is also helpful. Also, avoiding dangerous activities around the house will further help reduce your risk of head injury.  WHEN CAN I RETURN TO NORMAL ACTIVITIES AND ATHLETICS? You should be reevaluated by your health care provider before returning to these activities. If you have any of the following symptoms, you should not return to activities or contact sports until 1 week after the symptoms have stopped:  Persistent headache.  Dizziness or vertigo.  Poor attention and concentration.  Confusion.  Memory problems.  Nausea or vomiting.  Fatigue or tire easily.  Irritability.  Intolerant of bright lights or loud noises.  Anxiety or depression.  Disturbed sleep. MAKE SURE YOU:   Understand these instructions.  Will watch your condition.  Will get help right away if you are not doing well or get worse. Document Released: 08/13/2005  Document Revised: 08/18/2013 Document Reviewed: 04/20/2013 [facility] Patient Information 2015 [place], [place]. This information is not intended to replace advice given to you by your health care provider. Make sure you discuss any questions you have with your health care provider.  Muscle Strain A muscle strain is an injury that occurs when a muscle is stretched beyond its normal length. Usually a small number of muscle fibers are torn when this happens. Muscle strain is rated in degrees. First-degree strains have the least amount of muscle fiber tearing and pain. Second-degree and third-degree strains have increasingly more tearing and pain.  Usually, recovery from muscle strain takes 1-2 weeks. Complete healing takes 5-6 weeks.  CAUSES  Muscle strain happens when a sudden, violent force placed on a muscle stretches it too far. This may occur with lifting, sports, or a fall.  RISK FACTORS Muscle strain is especially common in athletes.  SIGNS AND SYMPTOMS At the site of the muscle strain, there may be:  Pain.  Bruising.  Swelling.  Difficulty using the muscle due to pain or lack of normal function. DIAGNOSIS  Your health care provider will perform a physical exam and ask about your medical history. TREATMENT  Often, the best treatment for a muscle strain is resting, icing, and applying cold compresses to the injured area.  HOME CARE INSTRUCTIONS   Use the PRICE method of treatment to promote muscle healing during the first 2-3 days after your injury. The PRICE method involves:  Protecting the muscle from being injured again.  Restricting your activity and resting the injured body part.  Icing your injury. To do this, put ice in a plastic bag. Place a towel between your skin and the bag. Then, apply the ice and leave it on from 15-20 minutes each hour. After the third day, switch to moist heat packs.  Apply compression to the injured area with a splint or elastic bandage. Be careful  not to wrap it too tightly. This may interfere with blood circulation or increase swelling.  Elevate the injured body part above the level of your heart as often as you can.  Only take over-the-counter or prescription medicines for pain, discomfort, or fever as directed by your health care provider.  Warming up prior to exercise helps to prevent future muscle strains. SEEK MEDICAL CARE IF:   You have increasing pain or swelling in the injured area.  You have numbness, tingling, or a significant loss of strength in the injured area. MAKE SURE YOU:   Understand these instructions.  Will watch your condition.  Will get help  right away if you are not doing well or get worse. Document Released: 08/13/2005 Document Revised: 06/03/2013 Document Reviewed: 03/12/2013 [facility] Patient Information 2015 [place]. This information is not intended to replace advice given to you by your health care provider. Make sure you discuss any questions you have with your health care provider.

## 2014-03-18 NOTE — ED Provider Notes (Signed)
CSN: [id number]     Arrival date & time 03/18/14  2150 History   First MD Initiated Contact with Patient 03/18/14 2205     Chief Complaint  Patient presents with  . Fall  . Hypertension     (Consider location/radiation/quality/duration/timing/severity/associated sxs/prior Treatment) Patient is a 48 y.o. male presenting with fall and hypertension.  Fall Associated symptoms include headaches. Pertinent negatives include no chest pain, no abdominal pain and no shortness of breath.  Hypertension Associated symptoms include headaches. Pertinent negatives include no chest pain, no abdominal pain and no shortness of breath.   Patient states he was at church and had 4 falls this afternoon. This was12 hours ago. Denies hitting his head during the fall or loss of consciousness. He did state he lost control of his legs and ran head first into a wall. Again, he did not lose consciousness. He did this twice. He now complains of low back pain. This pain does not radiate. He has no bladder or bowel incontinence. He has no weakness but does complain of tingling to his bilateral upper and lower extremities. Patient denies substance abuse or alcohol intoxication. Past Medical History  Diagnosis Date  . Hypertension   . Anxiety and depression   . Osteoarthrosis, unspecified whether generalized or localized, unspecified site   . Irritable bowel syndrome   . GERD (gastroesophageal reflux disease)     h/o esophagitis  . Allergic rhinitis   . Insomnia   . Diverticulosis   . Alcohol abuse 2012  . Polysubstance abuse   . Seizures   . Alcoholic pancreatitis   . Periumbilical hernia 12/13/12  . Fatty liver 10/08/11  . Internal hemorrhoids 03/13/11  . Hiatal hernia 03/13/11  . Asthmatic bronchitis   . Frequent headaches   . 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 Prediabetes 03/01/2014   Past Surgical History  Procedure Laterality Date  . Tibula surgery Right 2002   Family History  Problem Relation Age of  Onset  . Heart disease Mother   . Gallbladder disease Mother   . Nephrolithiasis Mother   . Arthritis Mother   . Hyperlipidemia Mother   . Stroke Mother   . Irritable bowel syndrome Mother   . Cancer Mother     vulva  . Colon cancer Neg Hx   . Hypertension Father     moth  . Heart disease Father   . Arthritis Father   . Irritable bowel syndrome Brother     x 2   History  Substance Use Topics  . Smoking status: Former Smoker -- 1.50 packs/day for 18 years    Types: Cigarettes    Quit date: 08/28/1991  . Smokeless tobacco: Current User    Types: Chew     Comment: currently uses snuff.11/17/13  . Alcohol Use: No     Comment: no alcohol in one yr; drank 35 years alcohol, quit 02/2012    Review of Systems  Constitutional: Negative for fever and chills.  Respiratory: Negative for shortness of breath.   Cardiovascular: Negative for chest pain.  Gastrointestinal: Negative for nausea, vomiting, abdominal pain, diarrhea and constipation.  Musculoskeletal: Positive for back pain. Negative for myalgias, neck pain and neck stiffness.  Skin: Negative for rash and wound.  Neurological: Positive for numbness and headaches. Negative for dizziness, seizures, syncope, weakness and light-headedness.  All other systems reviewed and are negative.     Allergies  Codeine; Benazepril; Ativan; and Corticosteroids  Home Medications   Prior to Admission medications   Medication  Sig Start Date End Date Taking? Authorizing Provider  albuterol (PROVENTIL HFA;VENTOLIN HFA) 108 (90 BASE) MCG/ACT inhaler Inhale 2 puffs into the lungs every 6 (six) hours as needed for wheezing or shortness of breath. 12/15/13  Yes Leslye Peerobert S Byrum, MD  aspirin 81 MG chewable tablet Chew 81 mg by mouth daily.   Yes Historical Provider, MD  atenolol (TENORMIN) 100 MG tablet Take 100 mg by mouth daily.   Yes Historical Provider, MD  budesonide-formoterol (SYMBICORT) 160-4.5 MCG/ACT inhaler Inhale 2 puffs into the lungs 2  (two) times daily. 01/11/14  Yes Leslye Peerobert S Byrum, MD  carbamazepine (TEGRETOL XR) 200 MG 12 hr tablet Take 2 tablets (400 mg total) by mouth 2 (two) times daily. 02/24/14  Yes Etta Grandchildhomas L Jones, MD  carvedilol (COREG) 12.5 MG tablet Take 12.5 mg by mouth 2 (two) times daily with a meal. 01/15/14  Yes Etta Grandchildhomas L Jones, MD  Choline Fenofibrate 135 MG capsule Take 1 capsule (135 mg total) by mouth daily. 02/09/14  Yes Etta Grandchildhomas L Jones, MD  cloNIDine (CATAPRES) 0.1 MG tablet Take 1 tablet (0.1 mg total) by mouth 2 (two) times daily. 02/24/14  Yes Etta Grandchildhomas L Jones, MD  diphenhydrAMINE (BENADRYL) 25 MG tablet Take 25 mg by mouth every 6 (six) hours as needed for allergies.    Yes Historical Provider, MD  esomeprazole (NEXIUM) 40 MG capsule Take 1 capsule (40 mg total) by mouth daily before breakfast. 02/09/14  Yes Etta Grandchildhomas L Jones, MD  FLUoxetine (PROZAC) 20 MG capsule Take 60 mg by mouth at bedtime.  06/25/13  Yes Etta Grandchildhomas L Jones, MD  fluticasone (FLONASE) 50 MCG/ACT nasal spray Place 1 spray into both nostrils daily.   Yes Historical Provider, MD  gabapentin (NEURONTIN) 300 MG capsule Take 300 mg by mouth 2 (two) times daily. 06/25/13  Yes Etta Grandchildhomas L Jones, MD  hydrochlorothiazide (HYDRODIURIL) 25 MG tablet Take 1 tablet (25 mg total) by mouth daily. 01/15/14  Yes Etta Grandchildhomas L Jones, MD  hydrOXYzine (VISTARIL) 25 MG capsule Take 25 mg by mouth 2 (two) times daily.   Yes Historical Provider, MD  ibuprofen (ADVIL,MOTRIN) 200 MG tablet Take 400 mg by mouth every 6 (six) hours as needed for moderate pain.   Yes Historical Provider, MD  Multiple Vitamin (MULTIVITAMIN) tablet Take 1 tablet by mouth daily.   Yes Historical Provider, MD  oxybutynin (DITROPAN) 5 MG tablet Take 5 mg by mouth at bedtime.   Yes Historical Provider, MD  ranitidine (ZANTAC) 150 MG tablet Take 150 mg by mouth at bedtime.    Yes Historical Provider, MD  senna-docusate (SENNA-S) 8.6-50 MG per tablet Take 1 tablet by mouth daily.   Yes Historical Provider, MD   topiramate (TOPAMAX) 25 MG tablet Take 1 tablet (25 mg total) by mouth at bedtime. 11/17/13  Yes Leslye Peerobert S Byrum, MD   BP 173/88  Pulse 77  Temp(Src) 98.7 F (37.1 C) (Oral)  Resp 15  SpO2 99% Physical Exam  Nursing note and vitals reviewed. Constitutional: He is oriented to person, place, and time. He appears well-developed and well-nourished. No distress.  HENT:  Head: Normocephalic and atraumatic.  Mouth/Throat: Oropharynx is clear and moist.  Small abrasion to the right frontal region. No underlying bony deformity.   Eyes: EOM are normal. Pupils are equal, round, and reactive to light.  Neck: Normal range of motion. Neck supple.  No posterior midline cervical tenderness to palpation.  Cardiovascular: Normal rate and regular rhythm.  Exam reveals no gallop and no friction rub.  No murmur heard. Pulmonary/Chest: Effort normal and breath sounds normal. No respiratory distress. He has no wheezes. He has no rales. He exhibits no tenderness.  Abdominal: Soft. Bowel sounds are normal. He exhibits no distension and no mass. There is no tenderness. There is no rebound and no guarding.  Musculoskeletal: Normal range of motion. He exhibits no edema and no tenderness.  Moves all extremities without decrease range of motion. No joint swelling. Patient has no midline thoracic or lumbar tenderness. Patient does have some mild left-sided paraspinal lumbar tenderness and spasm. Negative straight leg raise. All distal pulses intact.  Neurological: He is alert and oriented to person, place, and time.  5/5 motor in all extremities. Sensation is intact. Patient is ambulatory without difficulty.  Skin: Skin is warm and dry. No rash noted. No erythema.  Psychiatric: He has a normal mood and affect. His behavior is normal.    ED Course  Procedures (including critical care time) Labs Review Labs Reviewed  CBC WITH DIFFERENTIAL - Abnormal; Notable for the following:    RBC 4.02 (*)    Hemoglobin 12.0  (*)    HCT 35.9 (*)    Monocytes Relative 14 (*)    Eosinophils Relative 7 (*)    All other components within normal limits  COMPREHENSIVE METABOLIC PANEL  TROPONIN I    Imaging Review Ct Head Wo Contrast  03/18/2014   CLINICAL DATA:  Status post fall; hit head. Concern for head injury.  EXAM: CT HEAD WITHOUT CONTRAST  TECHNIQUE: Contiguous axial images were obtained from the base of the skull through the vertex without intravenous contrast.  COMPARISON:  CT of the head performed 03/04/2013, and MRI of the brain performed 04/09/2013  FINDINGS: There is no evidence of acute infarction, mass lesion, or intra- or extra-axial hemorrhage on CT.  Tiny chronic lacunar infarcts are noted at the basal ganglia bilaterally. Mild periventricular white matter change likely reflects small vessel ischemic microangiopathy.  The posterior fossa, including the cerebellum, brainstem and fourth ventricle, is within normal limits. The third and lateral ventricles are unremarkable in appearance. The cerebral hemispheres are symmetric in appearance, with normal gray-white differentiation. No mass effect or midline shift is seen.  There is no evidence of fracture; visualized osseous structures are unremarkable in appearance. The orbits are within normal limits. A mucus retention cyst or polyp is noted within the right maxillary sinus. The remaining paranasal sinuses and mastoid air cells are well-aerated. No significant soft tissue abnormalities are seen.  IMPRESSION: 1. No evidence of traumatic intracranial injury or fracture. 2. Mild small vessel ischemic microangiopathy and tiny chronic lacunar infarcts at the basal ganglia bilaterally.   Electronically Signed   By: Roanna Raider M.D.   On: 03/18/2014 23:09     EKG Interpretation None      MDM   Final diagnoses:  None    Patient with a bizarre presentation. Appears to have mild head trauma. Question whether this is self-inflicted. We'll screen with CT of his  head and basic labs. Currently has a normal neurologic exam. Anticipate discharge.  No acute abnormality found on workup. Neurologic exam remains stable. Return precautions given.  Loren Racer, MD 03/19/14 [phone]

## 2014-03-18 NOTE — Telephone Encounter (Signed)
Patient's caregiver/Anne Chase called 3:18pm. She states she is "very" concerned about patient. He has fallen 4 times today, hitting his head twice on a wall and twice on a tile floor. She also states he had some issues yesterday with being able to walk. I did advise that she get him to the ED ASAP to be evaluated and possibly scanned, and to follow up with Dr. Everlena CooperJaffe after that work-up. She verbalized good understanding and said she would take patient to the hospital.

## 2014-03-18 NOTE — ED Notes (Addendum)
Initial contact-pt A&Ox4. Moving all extremities equally. Neurologically intact. Speaking full, clear sentences. Reports "I was at church and the pain just shot up my back and the pain was so bad I became almost incoherent. I remember hitting my head hard twice." No obvious deformities/lacerations noted. Reports feeling "warm and tingly from the back of my head and neck to both arms." Denies chest pain at the time but now reports medial chest pain. Denies SOB at this time but does feel some with exertion.Takes aspirin, gabapentin, atenolol. Has taken all these medications today. In NAD. Awaiting MD.

## 2014-03-18 NOTE — ED Notes (Signed)
Per EMS- suffered four falls today at church around 1430. Has pinched nerves in lower back. Does have hx numbness/tingling in bilateral lower legs. Lost balance today d/t numbness/tingling. Fell onto floor twice and fell into wall twice. Hit head on floor. C/o head pain. No obvious deformity/ laceration. Has been walking around since then. Went home and took BP at home. BP 200/130. C/o nausea and weakness/tingling in extremities. States "it is worse today than usual." Recently diagnosed with bronchitis. Hx asthma. A&Ox4. SCCA negative. Ambulatory on scene. BP 210/130 then 180/114 HR 80s RR 18 SpO2 96% on RA. Denies CP but does report SOB d/t bronchitis.

## 2014-03-20 ENCOUNTER — Other Ambulatory Visit: Payer: Self-pay | Admitting: Emergency Medicine

## 2014-03-22 ENCOUNTER — Telehealth: Payer: Self-pay | Admitting: *Deleted

## 2014-03-22 ENCOUNTER — Ambulatory Visit (INDEPENDENT_AMBULATORY_CARE_PROVIDER_SITE_OTHER): Payer: No Typology Code available for payment source | Admitting: Neurology

## 2014-03-22 ENCOUNTER — Encounter: Payer: Self-pay | Admitting: Neurology

## 2014-03-22 ENCOUNTER — Telehealth: Payer: Self-pay | Admitting: Neurology

## 2014-03-22 ENCOUNTER — Telehealth: Payer: Self-pay | Admitting: Emergency Medicine

## 2014-03-22 VITALS — BP 128/88 | HR 60 | Resp 18 | Ht 62.0 in | Wt 197.0 lb

## 2014-03-22 DIAGNOSIS — M545 Low back pain, unspecified: Secondary | ICD-10-CM

## 2014-03-22 DIAGNOSIS — H8110 Benign paroxysmal vertigo, unspecified ear: Secondary | ICD-10-CM

## 2014-03-22 MED ORDER — ATENOLOL 100 MG PO TABS: ORAL_TABLET | ORAL | Status: DC

## 2014-03-22 MED ORDER — GABAPENTIN 300 MG PO CAPS: 300.0000 mg | ORAL_CAPSULE | Freq: Two times a day (BID) | ORAL | Status: DC

## 2014-03-22 NOTE — Telephone Encounter (Signed)
Needs Robaxin refill. Uses [facility], # [phone]. Pt CB# [phone] / [name]

## 2014-03-22 NOTE — Progress Notes (Addendum)
NEUROLOGY FOLLOW UP OFFICE NOTE  Martin Singh [id number]  HISTORY OF PRESENT ILLNESS: Martin Singh is a 48 year old man who is seen for chronic daily headaches and seizure-like episodes, presents for dizziness and falls.  He is accompanied by his girlfriend.  Records and images were personally reviewed where available.     UPDATE: On the evening of 03/17/14, he felt nauseous. He was in extreme pain to do his chronic back pain. His medications have not been working. He went to bed and he felt better the next morning. He was in church when suddenly he experienced severe spinning sensation, associated with bending down or changing head or body position. It was associated with nausea and vomiting. It caused him to fall headfirst into a wall. He got up and fell again into another wall. He fell about 4 times. If he would stop moving than the dizziness resolved. His girlfriend took his blood pressure and it was in the 230s/170s. He went to the ED where CT of the head was unremarkable, showing only mild small vessel disease and remote punctate lacunar infarcts in the basal ganglia bilaterally. His exam was normal and he was discharged. Since that time, he has been feeling a lot better but he still feels woozy if he moves around and too much.  HISTORY: SEIZURE-LIKE EPISODES: Onset of seizures started about 2 years ago, around the time he quit alcohol and lost custody of his son.  It started around the time he quit alcohol.  He will suddenly feel his whole body stiffen and shake.  He does not lose awareness.  It lasts for a couple of minutes.  No postictal state or preceding aura.  No urinary incontinence.  No tongue biting.  Will sometimes note urinary urgency but usually able to hold it in.  Stress and panic attacks trigger them.  He also has spells that occur while asleep, as witnessed by his caretaker.  In his sleep, he would stiffen with flexed posturing of his arms and start shaking for a couple of  minutes.  Sometimes he would awake but other times he would remain asleep.  In the morning, he sometimes he has wet the bed.  Spells occur about 2 times a week but can fluctuate, sometimes goes a couple of weeks without episode.  He takes Tegretol 200/200/400.  Tegretol level from 07/22/13 was 11.7.  04/13/13 MRI Brain w/wo:  mild small vessel changes, but nothing remarkable. 04/21/13 EEG revealed mild generalized background slowing, likely pharmacologic effect. 10/18/13 LABS:  Na 141, K 4.1, Cl 102, HCO2 27, gluc 100, BUN 26, Cr 1.47, Ca 9, TB 7, Alb 4.1, AST 29, ALT 27, AP 47, TB <0.2, DB <0.2  CHRONIC TENSION HEADACHES: History of migraines but has been daily for the past 6 months.  It's a nonthrobbing pressure-like pain in a band-like distribution, radiating into his neck, about 7/10.  It is associated with nausea, but not photophobia or phonophobia.  Resting and ice packs help relieve.  Activity and neck movement makes it worse.  Advil, Tylenol and Excedrin are not effective.  He was taking them daily but not anymore.  He does have neck pain.  He was initially on nortriptyline, which was discontinued after it was thought to have contributed to suicidal ideation.  He was titrated up on atenolol to 100mg  daily.  He went to PT for the neck, which helped.   Currently takes: He takes Topamax 25mg  at bedtime and tizanidine 2mg  for neck  spasms.  He also takes atenolol 100mg  (also used for blood pressure).  Receives psychiatric care from Lauris Poag, NP and Sabas Sous at [facility].  PAST MEDICAL HISTORY: Past Medical History  Diagnosis Date  . Hypertension   . Anxiety and depression   . Osteoarthrosis, unspecified whether generalized or localized, unspecified site   . Irritable bowel syndrome   . GERD (gastroesophageal reflux disease)     h/o esophagitis  . Allergic rhinitis   . Insomnia   . Diverticulosis   . Alcohol abuse 2012  . Polysubstance abuse   . Seizures   .  Alcoholic pancreatitis   . Periumbilical hernia 12/13/12  . Fatty liver 10/08/11  . Internal hemorrhoids 03/13/11  . Hiatal hernia 03/13/11  . Asthmatic bronchitis   . Frequent headaches   . 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 Prediabetes 03/01/2014    MEDICATIONS: Current Outpatient Prescriptions on File Prior to Visit  Medication Sig Dispense Refill  . albuterol (PROVENTIL HFA;VENTOLIN HFA) 108 (90 BASE) MCG/ACT inhaler Inhale 2 puffs into the lungs every 6 (six) hours as needed for wheezing or shortness of breath.  1 Inhaler  3  . aspirin 81 MG chewable tablet Chew 81 mg by mouth daily.      Marland Kitchen atenolol (TENORMIN) 100 MG tablet Take 100 mg by mouth daily.      . budesonide-formoterol (SYMBICORT) 160-4.5 MCG/ACT inhaler Inhale 2 puffs into the lungs 2 (two) times daily.  1 Inhaler  11  . carbamazepine (TEGRETOL XR) 200 MG 12 hr tablet Take 2 tablets (400 mg total) by mouth 2 (two) times daily.  180 tablet  0  . carvedilol (COREG) 12.5 MG tablet Take 12.5 mg by mouth 2 (two) times daily with a meal.      . Choline Fenofibrate 135 MG capsule Take 1 capsule (135 mg total) by mouth daily.  90 capsule  0  . cloNIDine (CATAPRES) 0.1 MG tablet Take 1 tablet (0.1 mg total) by mouth 2 (two) times daily.  180 tablet  0  . diphenhydrAMINE (BENADRYL) 25 MG tablet Take 25 mg by mouth every 6 (six) hours as needed for allergies.       Marland Kitchen esomeprazole (NEXIUM) 40 MG capsule Take 1 capsule (40 mg total) by mouth daily before breakfast.  90 capsule  0  . FLUoxetine (PROZAC) 20 MG capsule Take 60 mg by mouth at bedtime.       . fluticasone (FLONASE) 50 MCG/ACT nasal spray Place 1 spray into both nostrils daily.      . hydrochlorothiazide (HYDRODIURIL) 25 MG tablet Take 1 tablet (25 mg total) by mouth daily.  90 tablet  4  . hydrOXYzine (VISTARIL) 25 MG capsule Take 25 mg by mouth 2 (two) times daily.      Marland Kitchen ibuprofen (ADVIL,MOTRIN) 200 MG tablet Take 400 mg by mouth every 6 (six) hours as needed for moderate  pain.      . methocarbamol (ROBAXIN) 500 MG tablet Take 1 tablet (500 mg total) by mouth 2 (two) times daily.  20 tablet  0  . Multiple Vitamin (MULTIVITAMIN) tablet Take 1 tablet by mouth daily.      Marland Kitchen oxybutynin (DITROPAN) 5 MG tablet Take 5 mg by mouth at bedtime.      . ranitidine (ZANTAC) 150 MG tablet Take 150 mg by mouth at bedtime.       . senna-docusate (SENNA-S) 8.6-50 MG per tablet Take 1 tablet by mouth daily.      Marland Kitchen  topiramate (TOPAMAX) 25 MG tablet Take 1 tablet (25 mg total) by mouth at bedtime.  90 tablet  3   No current facility-administered medications on file prior to visit.    ALLERGIES: Allergies  Allergen Reactions  . Codeine Anaphylaxis  . Benazepril Cough  . Ativan [Lorazepam] Other (See Comments)    Becomes violent and hallucinates  . Corticosteroids Other (See Comments)    Shaky and high blood pressure    FAMILY HISTORY: Family History  Problem Relation Age of Onset  . Heart disease Mother   . Gallbladder disease Mother   . Nephrolithiasis Mother   . Arthritis Mother   . Hyperlipidemia Mother   . Stroke Mother   . Irritable bowel syndrome Mother   . Cancer Mother     vulva  . Colon cancer Neg Hx   . Hypertension Father     moth  . Heart disease Father   . Arthritis Father   . Irritable bowel syndrome Brother     x 2    SOCIAL HISTORY: History   Social History  . Marital Status: Divorced    Spouse Name: N/A    Number of Children: 1  . Years of Education: N/A   Occupational History  . unemployed-- apllying for disability    Social History Main Topics  . Smoking status: Former Smoker -- 1.50 packs/day for 18 years    Types: Cigarettes    Quit date: 08/28/1991  . Smokeless tobacco: Current User    Types: Chew     Comment: currently uses snuff.11/17/13  . Alcohol Use: No     Comment: no alcohol in one yr; drank 35 years alcohol, quit 02/2012  . Drug Use: No     Comment: (denies current use) benzos- ; hx of THC, cocaine,  uppers/downers  . Sexual Activity: Yes     Comment: no birth control   Other Topics Concern  . Not on file   Social History Narrative   Lives w/ fiancee     REVIEW OF SYSTEMS: Constitutional: No fevers, chills, or sweats, no generalized fatigue, change in appetite Eyes: No visual changes, double vision, eye pain Ear, nose and throat: No hearing loss, ear pain, nasal congestion, sore throat Cardiovascular: No chest pain, palpitations Respiratory:  No shortness of breath at rest or with exertion, wheezes GastrointestinaI: No nausea, vomiting, diarrhea, abdominal pain, fecal incontinence Genitourinary:  No dysuria, urinary retention or frequency Musculoskeletal:  No neck pain, back pain Integumentary: No rash, pruritus, skin lesions Neurological: as above Psychiatric: No depression, insomnia, anxiety Endocrine: No palpitations, fatigue, diaphoresis, mood swings, change in appetite, change in weight, increased thirst Hematologic/Lymphatic:  No anemia, purpura, petechiae. Allergic/Immunologic: no itchy/runny eyes, nasal congestion, recent allergic reactions, rashes  PHYSICAL EXAM: Filed Vitals:   03/22/14 1255  BP: 128/88  Pulse: 60  Resp: 18   General: No acute distress Head:  Normocephalic/atraumatic Neck: supple, no paraspinal tenderness, full range of motion Heart:  Regular rate and rhythm Lungs:  Clear to auscultation bilaterally Back: No paraspinal tenderness Neurological Exam: alert and oriented to person, place, and time. Attention span and concentration intact, recent and remote memory intact, fund of knowledge intact.  Speech fluent and not dysarthric, language intact.  CN II-XII intact. Fundoscopic exam unremarkable without vessel changes, exudates, hemorrhages or papilledema.  Bulk and tone normal, muscle strength 5/5 throughout.  Sensation to light touch, temperature and vibration intact.  Deep tendon reflexes 2+ throughout, toes downgoing.  Finger to nose and heel to  shin testing intact.  Gait normal, Romberg negative.  Head Impulse Test positive bilaterally.  IMPRESSION: 1.  Peripheral vestibulopathy, likely benign paroxysmal positional vertigo.  A cerebral vascular event she does be considered in the setting of extremely elevated blood pressure. However, symptoms have been resolving and he has a nonfocal exam. Positive head impulse test also suggests a peripheral vestibulopathy rather than a central etiology. 2.  Back pain.  PLAN: 1.  Rest for the next few days to see if vertigo resolves.  If it does not resolve, consider referral to vestibular rehab. 2.  Gabapentin 300mg  twice daily for back pain. 3.  I will also send a message to his pulmonologist, Dr. Levy Pupaobert Byrum, to ask if he has any concern for Martin Singh to remain on atenolol (given his history of asthma).  Shon MilletAdam Jaffe, DO  CC:  Willow OraJose Paz, MD

## 2014-03-22 NOTE — Patient Instructions (Signed)
1.  Gabapentin 300mg  twice daily for back pain 2.  Take it easy for next few days.  The dizziness is likely inner ear.

## 2014-03-22 NOTE — Telephone Encounter (Signed)
RX has been sent. Nothing further needed and fiancee is aware.

## 2014-03-23 ENCOUNTER — Other Ambulatory Visit: Payer: Self-pay | Admitting: *Deleted

## 2014-03-23 ENCOUNTER — Telehealth: Payer: Self-pay | Admitting: Emergency Medicine

## 2014-03-23 MED ORDER — ATENOLOL 100 MG PO TABS: 100.0000 mg | ORAL_TABLET | Freq: Every day | ORAL | Status: DC

## 2014-03-23 NOTE — Telephone Encounter (Signed)
Patient is aware that PCP will need to refill Robaxin

## 2014-03-23 NOTE — Telephone Encounter (Signed)
He should discuss this with his PCP, since that is who initially prescribed the medication.

## 2014-03-23 NOTE — Telephone Encounter (Signed)
Spoke with pt's care giver Loni Dollynn Chase and they are requesting results of sleep study. Advised would send message to RB for rec's and they are aware will receive call back tomorrow.  RB please advise on sleep study results thanks

## 2014-03-29 NOTE — Telephone Encounter (Signed)
Please notify the patient that he had some snoring a some episodes where his breathing was impaired, but not enough to give him an official dx of sleep apnea. At this point he does not need to use a CPAP mask. He will need to work on wt loss - this should help

## 2014-03-29 NOTE — Telephone Encounter (Signed)
Spoke with Daughter--Martin Singh Aware that RB not in office until 03/30/14 Aware that we will contact her as soon as the results are available.

## 2014-03-29 NOTE — Telephone Encounter (Signed)
Spoke with Martin Singh and advised of pt's sleep study results

## 2014-03-29 NOTE — Telephone Encounter (Signed)
lmtcb x 1 to make patient aware that RB not in office until 8/4

## 2014-03-29 NOTE — Telephone Encounter (Signed)
lmtcb x1 

## 2014-03-29 NOTE — Telephone Encounter (Signed)
[name] chase returned call Please call [phone]

## 2014-04-01 ENCOUNTER — Other Ambulatory Visit: Payer: Self-pay

## 2014-04-02 ENCOUNTER — Ambulatory Visit (INDEPENDENT_AMBULATORY_CARE_PROVIDER_SITE_OTHER): Payer: No Typology Code available for payment source | Admitting: Internal Medicine

## 2014-04-02 ENCOUNTER — Other Ambulatory Visit: Payer: Self-pay | Admitting: *Deleted

## 2014-04-02 ENCOUNTER — Other Ambulatory Visit (INDEPENDENT_AMBULATORY_CARE_PROVIDER_SITE_OTHER): Payer: No Typology Code available for payment source

## 2014-04-02 ENCOUNTER — Encounter: Payer: Self-pay | Admitting: Internal Medicine

## 2014-04-02 VITALS — BP 119/84 | HR 64 | Temp 98.6°F | Wt 195.5 lb

## 2014-04-02 DIAGNOSIS — E785 Hyperlipidemia, unspecified: Secondary | ICD-10-CM

## 2014-04-02 DIAGNOSIS — M545 Low back pain, unspecified: Secondary | ICD-10-CM

## 2014-04-02 DIAGNOSIS — I129 Hypertensive chronic kidney disease with stage 1 through stage 4 chronic kidney disease, or unspecified chronic kidney disease: Secondary | ICD-10-CM

## 2014-04-02 DIAGNOSIS — E119 Type 2 diabetes mellitus without complications: Secondary | ICD-10-CM

## 2014-04-02 DIAGNOSIS — I10 Essential (primary) hypertension: Secondary | ICD-10-CM

## 2014-04-02 LAB — BASIC METABOLIC PANEL
BUN: 14 mg/dL (ref 6–23)
CHLORIDE: 94 meq/L — AB (ref 96–112)
CO2: 30 meq/L (ref 19–32)
CREATININE: 1.2 mg/dL (ref 0.4–1.5)
Calcium: 9.4 mg/dL (ref 8.4–10.5)
GFR: 70.43 mL/min (ref >60.00)
Glucose, Bld: 107 mg/dL — ABNORMAL HIGH (ref 70–99)
Potassium: 4.1 mEq/L (ref 3.5–5.1)
Sodium: 131 mEq/L — ABNORMAL LOW (ref 135–145)

## 2014-04-02 LAB — LIPID PANEL
Cholesterol: 256 mg/dL — ABNORMAL HIGH (ref 0–200)
HDL: 60.5 mg/dL (ref >39.00)
LDL Cholesterol: 158 mg/dL — ABNORMAL HIGH (ref 0–99)
NonHDL: 195.5
Total CHOL/HDL Ratio: 4
Triglycerides: 186 mg/dL — ABNORMAL HIGH (ref 0.0–149.0)
VLDL: 37.2 mg/dL (ref 0.0–40.0)

## 2014-04-02 LAB — HEMOGLOBIN A1C: HEMOGLOBIN A1C: 5.9 % (ref 4.6–6.5)

## 2014-04-02 MED ORDER — BUDESONIDE-FORMOTEROL FUMARATE 160-4.5 MCG/ACT IN AERO: 2.0000 | INHALATION_SPRAY | Freq: Two times a day (BID) | RESPIRATORY_TRACT | Status: DC

## 2014-04-02 MED ORDER — CARVEDILOL 12.5 MG PO TABS: 12.5000 mg | ORAL_TABLET | Freq: Two times a day (BID) | ORAL | Status: DC

## 2014-04-02 MED ORDER — ALBUTEROL SULFATE HFA 108 (90 BASE) MCG/ACT IN AERS: 2.0000 | INHALATION_SPRAY | Freq: Four times a day (QID) | RESPIRATORY_TRACT | Status: DC | PRN

## 2014-04-02 MED ORDER — HYDROXYZINE PAMOATE 25 MG PO CAPS: 25.0000 mg | ORAL_CAPSULE | Freq: Two times a day (BID) | ORAL | Status: DC

## 2014-04-02 MED ORDER — HYDROCODONE-ACETAMINOPHEN 5-325 MG PO TABS: 1.0000 | ORAL_TABLET | Freq: Three times a day (TID) | ORAL | Status: DC | PRN

## 2014-04-02 MED ORDER — HYDROCHLOROTHIAZIDE 25 MG PO TABS: 25.0000 mg | ORAL_TABLET | Freq: Every day | ORAL | Status: DC

## 2014-04-02 MED ORDER — CHOLINE FENOFIBRATE 135 MG PO CPDR: 135.0000 mg | DELAYED_RELEASE_CAPSULE | Freq: Every day | ORAL | Status: DC

## 2014-04-02 MED ORDER — ATENOLOL 100 MG PO TABS: 100.0000 mg | ORAL_TABLET | Freq: Every day | ORAL | Status: DC

## 2014-04-02 MED ORDER — ESOMEPRAZOLE MAGNESIUM 40 MG PO CPDR: 40.0000 mg | DELAYED_RELEASE_CAPSULE | Freq: Every day | ORAL | Status: DC

## 2014-04-02 MED ORDER — CLONIDINE HCL 0.1 MG PO TABS: 0.1000 mg | ORAL_TABLET | Freq: Two times a day (BID) | ORAL | Status: DC

## 2014-04-02 MED ORDER — OXYBUTYNIN CHLORIDE 5 MG PO TABS: 5.0000 mg | ORAL_TABLET | Freq: Every day | ORAL | Status: DC

## 2014-04-02 MED ORDER — RANITIDINE HCL 150 MG PO TABS: 150.0000 mg | ORAL_TABLET | Freq: Every day | ORAL | Status: DC

## 2014-04-02 MED ORDER — FLUOXETINE HCL 20 MG PO CAPS: 60.0000 mg | ORAL_CAPSULE | Freq: Every day | ORAL | Status: DC

## 2014-04-02 NOTE — Progress Notes (Signed)
Subjective:    Patient ID: Martin Singh, male    DOB: [birth year], 48 y.o.   MRN: [id number]  DOS:  04/02/2014 Type of visit - description: Several issues to discuss,Here with his wife History: Micah FlesherWent to the ER 03/18/2014, chart reviewed: Had several falls, nausea, BP was elevated, CT of the head was negative for acute issues . Subsequently he saw his neurologist , note reviewed,  it was determined that he had peripheral dizziness. His BP is now better. His main concern today is actually back pain, since the last time I saw him the back pain is getting worse, is  steady, bilateral, increase when he bends, not radiating. Has not taken any medication for it although he has some Robaxin.    ROS Denies fever chills No lower extremity paresthesias Admits to incontinence, this is a ongoing problem for a while. No stool incontinence.   Past Medical History  Diagnosis Date  . Hypertension   . Anxiety and depression   . Osteoarthrosis, unspecified whether generalized or localized, unspecified site   . Irritable bowel syndrome   . GERD (gastroesophageal reflux disease)     h/o esophagitis  . Allergic rhinitis   . Insomnia   . Diverticulosis   . Alcohol abuse 2012  . Polysubstance abuse   . Seizures   . Alcoholic pancreatitis   . Periumbilical hernia 12/13/12  . Fatty liver 10/08/11  . Internal hemorrhoids 03/13/11  . Hiatal hernia 03/13/11  . Asthmatic bronchitis   . Frequent headaches   . 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 Prediabetes 03/01/2014    Past Surgical History  Procedure Laterality Date  . Tibula surgery Right 2002    History   Social History  . Marital Status: Divorced    Spouse Name: N/A    Number of Children: 1  . Years of Education: N/A   Occupational History  . unemployed-- apllying for disability    Social History Main Topics  . Smoking status: Former Smoker -- 1.50 packs/day for 18 years    Types: Cigarettes    Quit date: 08/28/1991  . Smokeless  tobacco: Current User    Types: Chew     Comment: currently uses snuff.11/17/13  . Alcohol Use: No     Comment: no alcohol in one yr; drank 35 years alcohol, quit 02/2012  . Drug Use: No     Comment: (denies current use) benzos- ; hx of THC, cocaine, uppers/downers  . Sexual Activity: Yes     Comment: no birth control   Other Topics Concern  . Not on file   Social History Narrative   Lives w/ fiancee         Medication List       This list is accurate as of: 04/02/14 11:59 PM.  Always use your most recent med list.               albuterol 108 (90 BASE) MCG/ACT inhaler  Commonly known as:  PROVENTIL HFA;VENTOLIN HFA  Inhale 2 puffs into the lungs every 6 (six) hours as needed for wheezing or shortness of breath.     aspirin 81 MG chewable tablet  Chew 81 mg by mouth daily.     atenolol 100 MG tablet  Commonly known as:  TENORMIN  Take 1 tablet (100 mg total) by mouth daily.     BENADRYL 25 MG tablet  Generic drug:  diphenhydrAMINE  Take 25 mg by mouth every 6 (six) hours as needed for allergies.  budesonide-formoterol 160-4.5 MCG/ACT inhaler  Commonly known as:  SYMBICORT  Inhale 2 puffs into the lungs 2 (two) times daily.     carbamazepine 200 MG 12 hr tablet  Commonly known as:  TEGRETOL XR  Take 2 tablets (400 mg total) by mouth 2 (two) times daily.     carvedilol 12.5 MG tablet  Commonly known as:  COREG  Take 1 tablet (12.5 mg total) by mouth 2 (two) times daily with a meal.     Choline Fenofibrate 135 MG capsule  Take 1 capsule (135 mg total) by mouth daily.     cloNIDine 0.1 MG tablet  Commonly known as:  CATAPRES  Take 1 tablet (0.1 mg total) by mouth 2 (two) times daily.     esomeprazole 40 MG capsule  Commonly known as:  NEXIUM  Take 1 capsule (40 mg total) by mouth daily before breakfast.     FLUoxetine 20 MG capsule  Commonly known as:  PROZAC  Take 3 capsules (60 mg total) by mouth at bedtime.     fluticasone 50 MCG/ACT nasal spray    Commonly known as:  FLONASE  Place 1 spray into both nostrils daily.     gabapentin 300 MG capsule  Commonly known as:  NEURONTIN  Take 1 capsule (300 mg total) by mouth 2 (two) times daily.     hydrochlorothiazide 25 MG tablet  Commonly known as:  HYDRODIURIL  Take 1 tablet (25 mg total) by mouth daily.     HYDROcodone-acetaminophen 5-325 MG per tablet  Commonly known as:  NORCO/VICODIN  Take 1-2 tablets by mouth every 8 (eight) hours as needed.     hydrOXYzine 25 MG capsule  Commonly known as:  VISTARIL  Take 1 capsule (25 mg total) by mouth 2 (two) times daily.     ibuprofen 200 MG tablet  Commonly known as:  ADVIL,MOTRIN  Take 400 mg by mouth every 6 (six) hours as needed for moderate pain.     methocarbamol 500 MG tablet  Commonly known as:  ROBAXIN  Take 1 tablet (500 mg total) by mouth 2 (two) times daily.     multivitamin tablet  Take 1 tablet by mouth daily.     oxybutynin 5 MG tablet  Commonly known as:  DITROPAN  Take 1 tablet (5 mg total) by mouth at bedtime.     ranitidine 150 MG tablet  Commonly known as:  ZANTAC  Take 1 tablet (150 mg total) by mouth at bedtime.     SENNA-S 8.6-50 MG per tablet  Generic drug:  senna-docusate  Take 1 tablet by mouth daily.     topiramate 25 MG tablet  Commonly known as:  TOPAMAX  Take 1 tablet (25 mg total) by mouth at bedtime.           Objective:   Physical Exam BP 119/84  Pulse 64  Temp(Src) 98.6 F (37 C) (Oral)  Wt 195 lb 8 oz (88.678 kg)  SpO2 98% General -- alert, well-developed, NAD.   Extremities-- no pretibial edema bilaterally  Neurologic--  alert & oriented X3. Speech normal, + antalgic posture and gait d/t pain in the back, strength symmetric and appropriate for age.  DTRs symmetric (decreased B ankle jerk) Psych-- Cognition and judgment appear intact. Cooperative with normal attention span and concentration. No anxious or depressed appearing.          Assessment & Plan:    Dizziness,  per  neurology   A number of refills were  provided on paper per patient request  Today , I spent more than 25   min with the patient: >50% of the time counseling regards back pain, , reviewing the chart and labs ordered by other providers , refilling a # of prescriptions

## 2014-04-02 NOTE — Patient Instructions (Addendum)
Will refer you to a  back specialist   For pain, take Tylenol  500 mg OTC 2 tabs a day every 8 hours as needed for pain If the pain is severe , take hydrocodone instead, do not take both Also try robaxin as needed   Next visit 4-5 months

## 2014-04-02 NOTE — Progress Notes (Signed)
Pre-visit discussion using our clinic review tool. No additional management support is needed unless otherwise documented below in the visit note.  

## 2014-04-03 DIAGNOSIS — M549 Dorsalgia, unspecified: Secondary | ICD-10-CM | POA: Insufficient documentation

## 2014-04-03 NOTE — Addendum Note (Signed)
Addended by: Willow OraPAZ, JOSE E on: 04/03/2014 05:49 PM   Modules accepted: Orders

## 2014-04-03 NOTE — Assessment & Plan Note (Signed)
Hypertension, under better control now

## 2014-04-03 NOTE — Assessment & Plan Note (Signed)
Back pain, Needs to see ortho , MRI? Unable to take prednisone. he is allergic to codeine however he is able to take hydrocodone, prescription will be issued; h/o abuse thus he won't be getting RFs He has Robaxin at his encouraged to take it.

## 2014-04-07 ENCOUNTER — Telehealth: Payer: Self-pay | Admitting: Emergency Medicine

## 2014-04-07 NOTE — Telephone Encounter (Signed)
Called spoke with spouse. Requesting last OV note and sleep study be faxed to pt kidney doctor. Fax # above confirmed. i have faxed records. Nothing further needed

## 2014-04-08 ENCOUNTER — Telehealth: Payer: Self-pay | Admitting: Neurology

## 2014-04-08 NOTE — Telephone Encounter (Signed)
Pt's caregiver called stating that the pt's pcp will no longer Rx TEGRETOL RX 400mg  #90 and would like To know if Dr. Everlena CooperJaffe can Rx it for him.  C/B [phone]

## 2014-04-08 NOTE — Telephone Encounter (Signed)
Left message on machine for patient's caregiver to call back. To confirm dosage information prior to sending prescription.

## 2014-04-08 NOTE — Telephone Encounter (Signed)
Is this something we can prescribe?

## 2014-04-08 NOTE — Telephone Encounter (Signed)
Please confirm dose with patient as Dr. Moises BloodJaffe's note said "Tegretol 200/200/400".  Once confirmed, ok to send Rx.  It looks like he takes it for seizures, even though he was seeing Dr. Everlena CooperJaffe for vertigo.   Donika K. Allena KatzPatel, DO

## 2014-04-12 NOTE — Telephone Encounter (Signed)
Patient never called back to confirm dose.

## 2014-04-13 ENCOUNTER — Ambulatory Visit: Payer: Self-pay | Admitting: Urology

## 2014-04-19 ENCOUNTER — Encounter: Payer: Self-pay | Admitting: Orthopedic Surgery

## 2014-04-23 ENCOUNTER — Other Ambulatory Visit: Payer: Self-pay

## 2014-04-23 ENCOUNTER — Telehealth: Payer: Self-pay | Admitting: Internal Medicine

## 2014-04-23 DIAGNOSIS — M545 Low back pain, unspecified: Secondary | ICD-10-CM

## 2014-04-23 MED ORDER — ATENOLOL 100 MG PO TABS: 100.0000 mg | ORAL_TABLET | Freq: Every day | ORAL | Status: DC

## 2014-04-23 NOTE — Telephone Encounter (Signed)
Per ortho @ [facility], needs L-S MRI before appoitnmet ---> ordered

## 2014-04-27 ENCOUNTER — Ambulatory Visit
Admission: RE | Admit: 2014-04-27 | Discharge: 2014-04-27 | Disposition: A | Discharge status: Home / Self Care | Payer: Self-pay | Source: Ambulatory Visit | Attending: Internal Medicine | Admitting: Internal Medicine

## 2014-04-27 DIAGNOSIS — M545 Low back pain, unspecified: Secondary | ICD-10-CM

## 2014-05-06 ENCOUNTER — Encounter: Payer: Self-pay | Admitting: Emergency Medicine

## 2014-05-06 ENCOUNTER — Ambulatory Visit (INDEPENDENT_AMBULATORY_CARE_PROVIDER_SITE_OTHER): Payer: No Typology Code available for payment source | Admitting: Emergency Medicine

## 2014-05-06 VITALS — BP 100/60 | HR 78 | Temp 98.3°F | Ht 62.0 in | Wt 186.4 lb

## 2014-05-06 DIAGNOSIS — J069 Acute upper respiratory infection, unspecified: Secondary | ICD-10-CM | POA: Insufficient documentation

## 2014-05-06 MED ORDER — AZITHROMYCIN 250 MG PO TABS: ORAL_TABLET | ORAL | Status: DC

## 2014-05-06 MED ORDER — PREDNISONE 10 MG PO TABS: ORAL_TABLET | ORAL | Status: DC

## 2014-05-06 NOTE — Assessment & Plan Note (Signed)
Did not meet severity criteria for CPAP. Will defer

## 2014-05-06 NOTE — Assessment & Plan Note (Signed)
With a flare in setting of URI/viral  Syndrome.  - low dose pred (he tolerates poorly)  - azithro - symptomatic relief - flu shot in future - rov 1 - disability paperwork completed

## 2014-05-06 NOTE — Patient Instructions (Signed)
We will try using low dose prednisone for your COPD flare Take azithromycin as directed Try using over-the-counter symptomatic meds  Continue your symbicort and albuterol as you have been taking them Your disability survery was filled today Follow with Dr Delton Coombes in 1 month

## 2014-05-06 NOTE — Progress Notes (Signed)
Subjective:    Patient ID: Martin Singh, male    DOB: 04-Dec-1965, 48 y.o.   MRN: [id number]  HPI 48 yo man, 28 pk-years tobacco, seizures, depression, EtOH and substance use, HH. Presents for eval of SOB that has been noticeable for almost 20 years. He was dx with suspected asthma at that time.  He stopped smoking about 20 yrs ago. He is having severe DOE with minimal exertion. He is having higher BP and HA after atenolol stopped recently.  He uses albuterol about 1-2x a day. Has a regular dry cough. Has some exertional wheeze. He snores and has witnessed apneas.  He has a lot of sinus drainage, congestion, some bleeding.  Has GERD that is managed by nexium.   He was seen in the ED on 10/18/13 for leg pain and edema and intermittent shortness of breath after a long bus ride / plane ride. His mother had passed away. Workup for PE was negative. He returned to the ED on 10/30/13 for back pain after he fell due to slipping down the stairs. XR of lumbar spine revealed mild degenerative changes at L5-S1 but no acute fractures. He has been taking naproxen a couple of times a day.   ROV 12/08/13 -- follows up for dyspnea, sinus disease. 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 He is on flonase.   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 Last time we started Symbicort > returns to discuss. He was less SOB after starting. For the last week he has been having more cough, white mucous, some fever, fatigue. 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  He has been having fever, PND, cough prod of tan mucous, HA, nausea, beginning 7+ days ago. His PSG showed some obstruction but no formal OSA.    Review of Systems  Constitutional: Negative for fever and unexpected weight change.  HENT: Positive for postnasal drip and  rhinorrhea. Negative for congestion, dental problem, ear pain, nosebleeds, sinus pressure, sneezing, sore throat and trouble swallowing.   Eyes: Negative for redness and itching.  Respiratory: Positive for cough, chest tightness and shortness of breath. Negative for wheezing.   Cardiovascular: Negative for palpitations and leg swelling.  Gastrointestinal: Negative for nausea and vomiting.  Genitourinary: Negative for dysuria.  Musculoskeletal: Negative for joint swelling.  Skin: Negative for rash.  Neurological: Negative for headaches.  Hematological: Does not bruise/bleed easily.  Psychiatric/Behavioral: Negative for dysphoric mood. The patient is not nervous/anxious.    09/28/2013 - CXR COMPARISON: 12/20/2012, 12/13/2012, 06/10/2012, and 11/19/2011  FINDINGS:  Heart size and pulmonary vascularity are normal and the lungs are  clear except for minimal linear atelectasis visible on the lateral  view. No osseous abnormality of significance.  IMPRESSION:  Minimal atelectasis as described. Otherwise, normal exam.      Objective:   Physical Exam Filed Vitals:   05/06/14 0908  BP: 100/60  Pulse: 78  Temp: 98.3 F (36.8 C)  Height:  (1.575 m)  Weight: 186 lb 6.4 oz (84.55 kg)  SpO2: 96%   Gen: Pleasant, well-nourished, in no distress,  normal affect  ENT: No lesions,  mouth clear,  oropharynx clear, no postnasal drip  Neck: No JVD, no TMG, no carotid bruits  Lungs: No use of accessory muscles, coughs with deep breath, soft wheezing  Cardiovascular:  RRR, heart sounds normal, no murmur or gallops, no peripheral edema  Abdomen: soft, protuberant, benign  Musculoskeletal: No deformities, no cyanosis or clubbing  Neuro: alert, non focal  Skin: Warm, no lesions or rashes        Assessment & Plan:  Intrinsic asthma With a flare in setting of URI/viral  Syndrome.  - low dose pred (he tolerates poorly)  - azithro - symptomatic relief - flu shot in future - rov 1 -  disability paperwork completed  Sleep apnea Did not meet severity criteria for CPAP. Will defer

## 2014-05-19 ENCOUNTER — Telehealth: Payer: Self-pay | Admitting: *Deleted

## 2014-05-19 ENCOUNTER — Other Ambulatory Visit: Payer: Self-pay | Admitting: *Deleted

## 2014-05-19 ENCOUNTER — Telehealth: Payer: Self-pay | Admitting: Emergency Medicine

## 2014-05-19 DIAGNOSIS — R569 Unspecified convulsions: Secondary | ICD-10-CM

## 2014-05-19 DIAGNOSIS — R51 Headache: Secondary | ICD-10-CM

## 2014-05-19 MED ORDER — TOPIRAMATE 25 MG PO TABS: 25.0000 mg | ORAL_TABLET | Freq: Every day | ORAL | Status: DC

## 2014-05-19 MED ORDER — FLUTICASONE PROPIONATE 50 MCG/ACT NA SUSP: 2.0000 | Freq: Every day | NASAL | Status: DC

## 2014-05-19 MED ORDER — CARBAMAZEPINE 200 MG PO TABS: 200.0000 mg | ORAL_TABLET | Freq: Two times a day (BID) | ORAL | Status: DC

## 2014-05-19 NOTE — Telephone Encounter (Signed)
Pt called f/u on the message. Please call pt .[phone]

## 2014-05-19 NOTE — Telephone Encounter (Signed)
RX written and signed at the front desk for pick up for tegretol and topamax

## 2014-05-19 NOTE — Telephone Encounter (Signed)
Patient is requesting a paper copie of her RX for Topamax and tegratol she will pick it up when it is available Call back number [phone]

## 2014-05-19 NOTE — Telephone Encounter (Signed)
Called spoke with pt care giver Thurston Hole. Pt is apply pt [facility] med assist and needs RX for flonase. This has been printed off and placed in RB sign folder. Please advise once done. thanks

## 2014-05-21 NOTE — Telephone Encounter (Signed)
Rx signed and given to care giver. Carron Curie, CMA

## 2014-05-25 ENCOUNTER — Ambulatory Visit (INDEPENDENT_AMBULATORY_CARE_PROVIDER_SITE_OTHER): Payer: Self-pay | Admitting: Urology

## 2014-05-25 DIAGNOSIS — R350 Frequency of micturition: Secondary | ICD-10-CM

## 2014-05-25 DIAGNOSIS — B49 Unspecified mycosis: Secondary | ICD-10-CM

## 2014-05-25 DIAGNOSIS — R351 Nocturia: Secondary | ICD-10-CM

## 2014-05-28 ENCOUNTER — Encounter: Payer: Self-pay | Admitting: Internal Medicine

## 2014-05-28 ENCOUNTER — Ambulatory Visit (INDEPENDENT_AMBULATORY_CARE_PROVIDER_SITE_OTHER): Payer: No Typology Code available for payment source | Admitting: Internal Medicine

## 2014-05-28 VITALS — BP 142/90 | HR 58 | Temp 98.8°F | Wt 186.5 lb

## 2014-05-28 DIAGNOSIS — M545 Low back pain, unspecified: Secondary | ICD-10-CM

## 2014-05-28 DIAGNOSIS — Z23 Encounter for immunization: Secondary | ICD-10-CM

## 2014-05-28 DIAGNOSIS — E785 Hyperlipidemia, unspecified: Secondary | ICD-10-CM

## 2014-05-28 DIAGNOSIS — D510 Vitamin B12 deficiency anemia due to intrinsic factor deficiency: Secondary | ICD-10-CM

## 2014-05-28 NOTE — Patient Instructions (Signed)
For back pain: [facility] Website with information about home physical therapy for back pain: [email or web address]/healthy-living/[email or web address]-health/[email or web address]-pain/[email or web address]   Next visit 3 months, fasting

## 2014-05-28 NOTE — Assessment & Plan Note (Signed)
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  Has been unable to see a back especially however i don't think the  MRI  Shows a surgical problem. i won't be able to  prescribe narcotics because his history of abuse. Plan: Robaxin, self physical therapy.

## 2014-05-28 NOTE — Progress Notes (Signed)
Subjective:    Patient ID: Martin Singh V Singh, male    DOB: 15-Jul-1966, 48 y.o.   MRN: [id number]  DOS:  05/28/2014 Type of visit - description : routine Interval history: Had a MRI, likes to discuss results with me   ROS Good compliance of medication however won't be able to take fenofibrate any longer because won't be available from the assistance program, they will cover Crestor. Today he has no new complaints, continue weight mid back pain, continue with numbness in both feet. He also has RLS type of symptoms. Has some right knee pain. Chart is reviewed, he saw pulmonary, had mild exacerbation, feels better.   Past Medical History  Diagnosis Date  . Hypertension   . Anxiety and depression   . Osteoarthrosis, unspecified whether generalized or localized, unspecified site   . Irritable bowel syndrome   . GERD (gastroesophageal reflux disease)     h/o esophagitis  . Allergic rhinitis   . Insomnia   . Diverticulosis   . Alcohol abuse 2012  . Polysubstance abuse   . Seizures   . Alcoholic pancreatitis   . Periumbilical hernia 12/13/12  . Fatty liver 10/08/11  . Internal hemorrhoids 03/13/11  . Hiatal hernia 03/13/11  . Asthmatic bronchitis   . Frequent headaches   . 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 Prediabetes 03/01/2014    Past Surgical History  Procedure Laterality Date  . Tibula surgery Right 2002    History   Social History  . Marital Status: Divorced    Spouse Name: N/A    Number of Children: 1  . Years of Education: N/A   Occupational History  . unemployed-- apllying for disability    Social History Main Topics  . Smoking status: Former Smoker -- 1.50 packs/day for 18 years    Types: Cigarettes    Quit date: 08/28/1991  . Smokeless tobacco: Current User    Types: Chew     Comment: currently uses snuff.11/17/13  . Alcohol Use: No     Comment: no alcohol in one yr; drank 35 years alcohol, quit 02/2012  . Drug Use: No     Comment: (denies current use)  benzos- ; hx of THC, cocaine, uppers/downers  . Sexual Activity: Yes     Comment: no birth control   Other Topics Concern  . Not on file   Social History Narrative   Lives w/ fiancee         Medication List       This list is accurate as of: 05/28/14 11:59 PM.  Always use your most recent med list.               albuterol 108 (90 BASE) MCG/ACT inhaler  Commonly known as:  PROVENTIL HFA;VENTOLIN HFA  Inhale 2 puffs into the lungs every 6 (six) hours as needed for wheezing or shortness of breath.     aspirin 81 MG chewable tablet  Chew 81 mg by mouth daily.     atenolol 100 MG tablet  Commonly known as:  TENORMIN  Take 1 tablet (100 mg total) by mouth daily.     BENADRYL 25 MG tablet  Generic drug:  diphenhydrAMINE  Take 25 mg by mouth every 6 (six) hours as needed for allergies.     budesonide-formoterol 160-4.5 MCG/ACT inhaler  Commonly known as:  SYMBICORT  Inhale 2 puffs into the lungs 2 (two) times daily.     carbamazepine 200 MG 12 hr tablet  Commonly known as:  TEGRETOL  XR  Take 2 tablets (400 mg total) by mouth 2 (two) times daily.     Choline Fenofibrate 135 MG capsule  Take 1 capsule (135 mg total) by mouth daily.     esomeprazole 40 MG capsule  Commonly known as:  NEXIUM  Take 1 capsule (40 mg total) by mouth daily before breakfast.     FLUoxetine 20 MG capsule  Commonly known as:  PROZAC  Take 3 capsules (60 mg total) by mouth at bedtime.     fluticasone 50 MCG/ACT nasal spray  Commonly known as:  FLONASE  Place 2 sprays into both nostrils daily.     gabapentin 300 MG capsule  Commonly known as:  NEURONTIN  Take 1 capsule (300 mg total) by mouth 2 (two) times daily.     hydrochlorothiazide 25 MG tablet  Commonly known as:  HYDRODIURIL  Take 1 tablet (25 mg total) by mouth daily.     hydrOXYzine 25 MG capsule  Commonly known as:  VISTARIL  Take 1 capsule (25 mg total) by mouth 2 (two) times daily.     ibuprofen 200 MG tablet  Commonly  known as:  ADVIL,MOTRIN  Take 400 mg by mouth every 6 (six) hours as needed for moderate pain.     methocarbamol 500 MG tablet  Commonly known as:  ROBAXIN  Take 1 tablet (500 mg total) by mouth 2 (two) times daily.     multivitamin tablet  Take 1 tablet by mouth daily.     ranitidine 150 MG tablet  Commonly known as:  ZANTAC  Take 1 tablet (150 mg total) by mouth at bedtime.     SENNA-S 8.6-50 MG per tablet  Generic drug:  senna-docusate  Take 1 tablet by mouth daily.     topiramate 25 MG tablet  Commonly known as:  TOPAMAX  Take 1 tablet (25 mg total) by mouth daily.           Objective:   Physical Exam BP 142/90  Pulse 58  Temp(Src) 98.8 F (37.1 C) (Oral)  Wt 186 lb 8 oz (84.596 kg)  SpO2 98% General -- alert, well-developed, NAD.   Lungs -- normal respiratory effort, no intercostal retractions, no accessory muscle use, and normal breath sounds.  Heart-- normal rate, regular rhythm, no murmur.   Extremities-- no pretibial edema bilaterally  Neurologic--  alert & oriented X3. Speech normal, gait appropriate for age, strength symmetric and appropriate for age.   Psych-- Cognition and judgment appear intact. Cooperative with normal attention span and concentration. No anxious or depressed appearing.       Assessment & Plan:

## 2014-05-28 NOTE — Progress Notes (Signed)
Pre visit review using our clinic review tool, if applicable. No additional management support is needed unless otherwise documented below in the visit note. 

## 2014-05-28 NOTE — Assessment & Plan Note (Addendum)
Won't be able to continue receiving fenofibrate however the assistance program however they will provide Crestor. His main problem is high triglycerides,  plan: Discontinue fenofibrate (although he will try to buy out of pocket) Come back in 3 months, fasting we'll check labs and prescribed Crestor if appropriate

## 2014-05-28 NOTE — Assessment & Plan Note (Signed)
Slightly low hemoglobin, The last time iron and b12   checked were  normal.

## 2014-06-01 ENCOUNTER — Telehealth: Payer: Self-pay | Admitting: Emergency Medicine

## 2014-06-01 ENCOUNTER — Telehealth: Payer: Self-pay | Admitting: Internal Medicine

## 2014-06-01 MED ORDER — AZITHROMYCIN 250 MG PO TABS: 250.0000 mg | ORAL_TABLET | ORAL | Status: DC

## 2014-06-01 MED ORDER — PREDNISONE 10 MG PO TABS: ORAL_TABLET | ORAL | Status: DC

## 2014-06-01 NOTE — Telephone Encounter (Signed)
Yes please give a Z-pack and pred taper, Take 40mg  daily for 3 days, then 30mg  daily for 3 days, then 20mg  daily for 3 days, then 10mg  daily for 3 days, then stop

## 2014-06-01 NOTE — Telephone Encounter (Signed)
State that pt is having problems with bronchitis, a very bad cough, and some reflux. Suggested pt call his PCP and have the bonchitis symptoms addressed first. Martin Hatchnn verbalized understanding.

## 2014-06-01 NOTE — Telephone Encounter (Signed)
Spoke with Loni DollyAnn Chase (EC) States that the patient is having another flare of severe bronchitis Requesting low dose Prednisone and Zpak as this has worked great in the past--course last given 05/06/14 Pt does not have insurance and are low on money--needs called into special pharmacy States that the patient gets majority of his medications for free through company called [facility] [phone]  Would like to know if the patient can be started on Nebulizer tx qhs?  Please advise Dr Delton CoombesByrum. Thanks.

## 2014-06-01 NOTE — Telephone Encounter (Signed)
Pt aware that Zpak and Pred Taper sent into [facility] via e-script Called and spoke with Peyton NajjarLarry (pharmacist) - verified the Rx received Nothing further needed.

## 2014-06-02 NOTE — Telephone Encounter (Signed)
Error

## 2014-06-07 ENCOUNTER — Ambulatory Visit (INDEPENDENT_AMBULATORY_CARE_PROVIDER_SITE_OTHER): Payer: Self-pay | Admitting: Neurology

## 2014-06-07 ENCOUNTER — Encounter: Payer: Self-pay | Admitting: Neurology

## 2014-06-07 VITALS — BP 105/60 | HR 70 | Ht 62.0 in | Wt 179.3 lb

## 2014-06-07 DIAGNOSIS — G44229 Chronic tension-type headache, not intractable: Secondary | ICD-10-CM

## 2014-06-07 DIAGNOSIS — I679 Cerebrovascular disease, unspecified: Secondary | ICD-10-CM

## 2014-06-07 DIAGNOSIS — M5416 Radiculopathy, lumbar region: Secondary | ICD-10-CM

## 2014-06-07 DIAGNOSIS — M549 Dorsalgia, unspecified: Secondary | ICD-10-CM

## 2014-06-07 DIAGNOSIS — R569 Unspecified convulsions: Secondary | ICD-10-CM

## 2014-06-07 DIAGNOSIS — H8123 Vestibular neuronitis, bilateral: Secondary | ICD-10-CM

## 2014-06-07 DIAGNOSIS — G8929 Other chronic pain: Secondary | ICD-10-CM

## 2014-06-07 MED ORDER — CYCLOBENZAPRINE HCL 5 MG PO TABS: ORAL_TABLET | ORAL | Status: DC

## 2014-06-07 NOTE — Patient Instructions (Addendum)
1.  Continue Topamax 25mg  daily and atenolol 100mg  daily 2.  Continue Tegretol XR 400mg  twice daily 3.  Continue gabapentin 300mg  twice daily 4.  Stop Robaxin.  Start cyclobenzaprine.  Take 5 to 10mg  three times daily as needed for back spasms 5.  Continue aspirin 81mg . 6.  Will check CBC, CMP, and tegretol level 7.  No driving 8.  Follow up in 6 months.

## 2014-06-07 NOTE — Progress Notes (Addendum)
NEUROLOGY FOLLOW UP OFFICE NOTE  Martin Singh [id number]  HISTORY OF PRESENT ILLNESS: Martin Singh is a 48 year old man with history of cerebrovascular disease, back pain, pernicious anemia and history of substance abuse who is seen for chronic daily headaches and seizure-like episodes, presents for dizziness and falls.  He is accompanied by his girlfriend.    UPDATE: He had another episode of vertigo, lasting 10-15 minutes.  Shaking spells are improved, about 3 times a month. Headaches are improved after initiating Topamax, about 3 days per week His back pain is still an issue and Robaxin not effective.  More recently, he developed shooting pain down the right leg.  MRI of the lumbar spine performed 04/27/14 showed minimally progressive spondylosis resulting in mild right and  borderline left foraminal stenosis at the L5-S1 level.  HISTORY: SEIZURE-LIKE EPISODES: Onset of seizures started about 2 years ago, around the time he quit alcohol and lost custody of his son.  It started around the time he quit alcohol.  He will suddenly feel his whole body stiffen and shake.  He does not lose awareness.  It lasts for a couple of minutes.  No postictal state or preceding aura.  No urinary incontinence.  No tongue biting.  Will sometimes note urinary urgency but usually able to hold it in.  Stress and panic attacks trigger them.  He also has spells that occur while asleep, as witnessed by his caretaker.  In his sleep, he would stiffen with flexed posturing of his arms and start shaking for a couple of minutes.  Sometimes he would awake but other times he would remain asleep.  In the morning, he sometimes he has wet the bed.  Spells occur about 2 times a week but can fluctuate, sometimes goes a couple of weeks without episode.  He takes Tegretol 200/200/400.  Tegretol level from 07/22/13 was 11.7.  10/18/13 LABS:  Na 141, K 4.1, Cl 102, HCO2 27, gluc 100, BUN 26, Cr 1.47, Ca 9, TB 7, Alb 4.1, AST 29, ALT  27, AP 47, TB <0.2, DB <0.2  CHRONIC TENSION HEADACHES: History of migraines but has been daily for the past 6 months.  It's a nonthrobbing pressure-like pain in a band-like distribution, radiating into his neck, about 7/10.  It is associated with nausea, but not photophobia or phonophobia.  Resting and ice packs help relieve.  Activity and neck movement makes it worse.  Advil, Tylenol and Excedrin are not effective.  He was taking them daily but not anymore.  He does have neck pain.  He was initially on nortriptyline, which was discontinued after it was thought to have contributed to suicidal ideation.  He was titrated up on atenolol to 100mg  daily.  He went to PT for the neck, which helped.   Currently takes: He takes Topamax 25mg  at bedtime and tizanidine 2mg  for neck spasms.  He also takes atenolol 100mg  (also used for blood pressure). He also has back pain, for which he takes gabapentin 300mg  twice daily and Robaxin.  MRI of the lumbar spine from 04/27/14 was personally reviewed and showed minimally progressive spondylosis with mild right and borderline left foraminal stenosis at L5-S1 level.   04/13/13 MRI Brain w/wo:  mild small vessel changes, but nothing remarkable. 04/21/13 EEG revealed mild generalized background slowing, likely pharmacologic effect. 03/17/14 CT HEAD:  only mild small vessel disease and remote punctate lacunar infarcts in the basal ganglia bilaterally 03/18/14 LABS:  CBC 4.1, HGB 12, HCT 35.9,  PLT 270, TP 6.8, Alb 3.9, AST 24, ALT 27, ALP 36, TB 0.2 04/02/14 LABS:  Chol 256, TG 186, LDL 158, Na 131, K 4.1, glucose 107, BUN 14, Cr 1.2, Hgb A1c 5.9  Receives psychiatric care from Lauris PoagLeslie O'Neal, NP and Sabas SousAudrey Croston [facility].  PAST MEDICAL HISTORY: Past Medical History  Diagnosis Date  . Hypertension   . Anxiety and depression   . Osteoarthrosis, unspecified whether generalized or localized, unspecified site   . Irritable bowel syndrome   . GERD  (gastroesophageal reflux disease)     h/o esophagitis  . Allergic rhinitis   . Insomnia   . Diverticulosis   . Alcohol abuse 2012  . Polysubstance abuse   . Seizures   . Alcoholic pancreatitis   . Periumbilical hernia 12/13/12  . Fatty liver 10/08/11  . Internal hemorrhoids 03/13/11  . Hiatal hernia 03/13/11  . Asthmatic bronchitis   . Frequent headaches   . 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 Prediabetes 03/01/2014    MEDICATIONS: Current Outpatient Prescriptions on File Prior to Visit  Medication Sig Dispense Refill  . albuterol (PROVENTIL HFA;VENTOLIN HFA) 108 (90 BASE) MCG/ACT inhaler Inhale 2 puffs into the lungs every 6 (six) hours as needed for wheezing or shortness of breath.  1 Inhaler  2  . aspirin 81 MG chewable tablet Chew 81 mg by mouth daily.      Marland Kitchen. atenolol (TENORMIN) 100 MG tablet Take 1 tablet (100 mg total) by mouth daily.  30 tablet  2  . azithromycin (ZITHROMAX) 250 MG tablet Take 1 tablet (250 mg total) by mouth as directed.  6 tablet  0  . budesonide-formoterol (SYMBICORT) 160-4.5 MCG/ACT inhaler Inhale 2 puffs into the lungs 2 (two) times daily.  1 Inhaler  2  . carbamazepine (TEGRETOL XR) 200 MG 12 hr tablet Take 2 tablets (400 mg total) by mouth 2 (two) times daily.  180 tablet  0  . diphenhydrAMINE (BENADRYL) 25 MG tablet Take 25 mg by mouth every 6 (six) hours as needed for allergies.       Marland Kitchen. esomeprazole (NEXIUM) 40 MG capsule Take 1 capsule (40 mg total) by mouth daily before breakfast.  30 capsule  2  . FLUoxetine (PROZAC) 20 MG capsule Take 3 capsules (60 mg total) by mouth at bedtime.  180 capsule  2  . gabapentin (NEURONTIN) 300 MG capsule Take 1 capsule (300 mg total) by mouth 2 (two) times daily.  60 capsule  3  . hydrochlorothiazide (HYDRODIURIL) 25 MG tablet Take 1 tablet (25 mg total) by mouth daily.  30 tablet  2  . hydrOXYzine (VISTARIL) 25 MG capsule Take 1 capsule (25 mg total) by mouth 2 (two) times daily.  60 capsule  3  . ibuprofen  (ADVIL,MOTRIN) 200 MG tablet Take 400 mg by mouth every 6 (six) hours as needed for moderate pain.      . methocarbamol (ROBAXIN) 500 MG tablet Take 1 tablet (500 mg total) by mouth 2 (two) times daily.  20 tablet  0  . Multiple Vitamin (MULTIVITAMIN) tablet Take 1 tablet by mouth daily.      . predniSONE (DELTASONE) 10 MG tablet Take 4 tablets daily x 3 days, then 3 x 3 days, then 2 x 3 days, then 1 x 3 days, then stop  30 tablet  0  . ranitidine (ZANTAC) 150 MG tablet Take 1 tablet (150 mg total) by mouth at bedtime.  30 tablet  2  . senna-docusate (  SENNA-S) 8.6-50 MG per tablet Take 1 tablet by mouth daily.      Marland Kitchen. topiramate (TOPAMAX) 25 MG tablet Take 1 tablet (25 mg total) by mouth daily.  120 tablet  3   No current facility-administered medications on file prior to visit.    ALLERGIES: Allergies  Allergen Reactions  . Codeine Anaphylaxis  . Benazepril Cough  . Ativan [Lorazepam] Other (See Comments)    Becomes violent and hallucinates  . Corticosteroids Other (See Comments)    Shaky and high blood pressure    FAMILY HISTORY: Family History  Problem Relation Age of Onset  . Heart disease Mother   . Gallbladder disease Mother   . Nephrolithiasis Mother   . Arthritis Mother   . Hyperlipidemia Mother   . Stroke Mother   . Irritable bowel syndrome Mother   . Cancer Mother     vulva  . Colon cancer Neg Hx   . Hypertension Father     moth  . Heart disease Father   . Arthritis Father   . Irritable bowel syndrome Brother     x 2    SOCIAL HISTORY: History   Social History  . Marital Status: Divorced    Spouse Name: N/A    Number of Children: 1  . Years of Education: N/A   Occupational History  . unemployed-- apllying for disability    Social History Main Topics  . Smoking status: Former Smoker -- 1.50 packs/day for 18 years    Types: Cigarettes    Quit date: 08/28/1991  . Smokeless tobacco: Current User    Types: Chew     Comment: currently uses snuff.11/17/13   . Alcohol Use: No     Comment: no alcohol in one yr; drank 35 years alcohol, quit 02/2012  . Drug Use: No     Comment: (denies current use) benzos- ; hx of THC, cocaine, uppers/downers  . Sexual Activity: Yes     Comment: no birth control   Other Topics Concern  . Not on file   Social History Narrative   Lives w/ fiancee     REVIEW OF SYSTEMS: Constitutional: No fevers, chills, or sweats, no generalized fatigue, change in appetite Eyes: No visual changes, double vision, eye pain Ear, nose and throat: No hearing loss, ear pain, nasal congestion, sore throat Cardiovascular: No chest pain, palpitations Respiratory:  No shortness of breath at rest or with exertion, wheezes GastrointestinaI: No nausea, vomiting, diarrhea, abdominal pain, fecal incontinence Genitourinary:  No dysuria, urinary retention or frequency Musculoskeletal:   back pain Integumentary: No rash, pruritus, skin lesions Neurological: as above Psychiatric: depression Endocrine: No palpitations, fatigue, diaphoresis, mood swings, change in appetite, change in weight, increased thirst Hematologic/Lymphatic:  No anemia, purpura, petechiae. Allergic/Immunologic: no itchy/runny eyes, nasal congestion, recent allergic reactions, rashes  PHYSICAL EXAM: Filed Vitals:   06/07/14 1338  BP: 105/60  Pulse: 70   General: No acute distress Head:  Normocephalic/atraumatic Neck: supple, no paraspinal tenderness, full range of motion Heart:  Regular rate and rhythm Lungs:  Clear to auscultation bilaterally Back: No paraspinal tenderness Neurological Exam: alert and oriented to person, place, and time. Attention span and concentration intact, recent and remote memory intact, fund of knowledge intact.  Speech fluent and not dysarthric, language intact.  CN II-XII intact. Fundoscopic exam unremarkable without vessel changes, exudates, hemorrhages or papilledema.  Bulk and tone normal, muscle strength 5/5 throughout.  Reduced  pinprick on the dorsum and bottom of the the right foot, up to  the upper ankle.  Deep tendon reflexes 2+ throughout, toes downgoing.  Finger to nose  intact.  Gait steady but mildly wide-based and cautious.  IMPRESSION: 1.  Chronic back pain, likely arthritic 2.  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  Since it cannot be absolutely proven without it captured on EEG, will continue Tegretol. 3.  Chronic tension-type headaches 4.  Cerebrovascular disease 5.  Depression 6.  Episodic vertigo.  Will check to see if dizzy spells may be related to toxicity from tegretol  PLAN: Continue Topamax 25mg  and atenolol 100mg  (for tension-type headache) Continue Tegretol XR 400mg  twice daily (for seizure-like spells) Continue gabapentin Start cyclobenzaprine for back pain.  Stop Robaxin. Check CBC, CMP and tegretol level ASA 81mg  and statin therapy.  Statin therapy (LDL goal should be less than 100) Treatment for depression Follow up in 6 months.  Shon Millet, DO  CC: Willow Ora, MD

## 2014-06-09 ENCOUNTER — Encounter: Payer: Self-pay | Admitting: Emergency Medicine

## 2014-06-09 ENCOUNTER — Ambulatory Visit (INDEPENDENT_AMBULATORY_CARE_PROVIDER_SITE_OTHER): Payer: Self-pay | Admitting: Emergency Medicine

## 2014-06-09 VITALS — BP 138/82 | HR 61

## 2014-06-09 DIAGNOSIS — J4521 Mild intermittent asthma with (acute) exacerbation: Secondary | ICD-10-CM

## 2014-06-09 DIAGNOSIS — J0110 Acute frontal sinusitis, unspecified: Secondary | ICD-10-CM

## 2014-06-09 DIAGNOSIS — J019 Acute sinusitis, unspecified: Secondary | ICD-10-CM | POA: Insufficient documentation

## 2014-06-09 MED ORDER — MOMETASONE FUROATE 50 MCG/ACT NA SUSP: 2.0000 | Freq: Every day | NASAL | Status: DC

## 2014-06-09 MED ORDER — AMOXICILLIN-POT CLAVULANATE 875-125 MG PO TABS: ORAL_TABLET | ORAL | Status: DC

## 2014-06-09 NOTE — Assessment & Plan Note (Signed)
Suspect this is driving his recurrent sx, cough, mucous.  - will treat with Augmentin 875 bid x 14 days if it is on his drug formulary; will find an alternative if not available.  - restart nasonex qd

## 2014-06-09 NOTE — Assessment & Plan Note (Signed)
No evidence of a flare at this time. I suspect we have been treating UA irritation with the prednisone. Will continue the symbicort, SABA prn.

## 2014-06-09 NOTE — Progress Notes (Signed)
Subjective:    Patient ID: Martin Singh, male    DOB: Jun 14, 1966, 48 y.o.   MRN: [id number]  HPI 48 yo man, 28 pk-years tobacco, seizures, depression, EtOH and substance use, HH. Presents for eval of SOB that has been noticeable for almost 20 years. He was dx with suspected asthma at that time.  He stopped smoking about 20 yrs ago. He is having severe DOE with minimal exertion. He is having higher BP and HA after atenolol stopped recently.  He uses albuterol about 1-2x a day. Has a regular dry cough. Has some exertional wheeze. He snores and has witnessed apneas.  He has a lot of sinus drainage, congestion, some bleeding.  Has GERD that is managed by nexium.   He was seen in the ED on 10/18/13 for leg pain and edema and intermittent shortness of breath after a long bus ride / plane ride. His mother had passed away. Workup for PE was negative. He returned to the ED on 10/30/13 for back pain after he fell due to slipping down the stairs. XR of lumbar spine revealed mild degenerative changes at L5-S1 but no acute fractures. He has been taking naproxen a couple of times a day.   ROV 12/08/13 -- follows up for dyspnea, sinus disease. 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 He is on flonase.   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 Last time we started Symbicort > returns to discuss. He was less SOB after starting. For the last week he has been having more cough, white mucous, some fever, fatigue. 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  He has been having fever, PND, cough prod of tan mucous, HA, nausea, beginning 7+ days ago. His PSG showed some obstruction but no formal OSA.   ROV 06/09/14 -- COPD/asthma. His AFL is mild and sx would appear to be out of proportion. He has had frequent flares of apparent  bronchitis, purulent cough. He has a lot of yellow sinus drainage with HA, sinus pressure. He has been treated with abx and pred x2 since last visit. He is using benadryl, nexium, zantac. He is on symbicort + SABA, uses .    Review of Systems  Constitutional: Negative for fever and unexpected weight change.  HENT: Positive for postnasal drip and rhinorrhea. Negative for congestion, dental problem, ear pain, nosebleeds, sinus pressure, sneezing, sore throat and trouble swallowing.   Eyes: Negative for redness and itching.  Respiratory: Positive for cough, chest tightness and shortness of breath. Negative for wheezing.   Cardiovascular: Negative for palpitations and leg swelling.  Gastrointestinal: Negative for nausea and vomiting.  Genitourinary: Negative for dysuria.  Musculoskeletal: Negative for joint swelling.  Skin: Negative for rash.  Neurological: Negative for headaches.  Hematological: Does not bruise/bleed easily.  Psychiatric/Behavioral: Negative for dysphoric mood. The patient is not nervous/anxious.         Objective:   Physical Exam Filed Vitals:   06/09/14 1408  BP: 138/82  Pulse: 61  SpO2: 96%   Gen: Pleasant, well-nourished, in no distress,  normal affect  ENT: No lesions,  mouth clear,  oropharynx clear, no postnasal drip  Neck: No JVD, no TMG, no carotid bruits  Lungs: No use of accessory muscles, coughs with deep breath, soft wheezing  Cardiovascular: RRR, heart sounds normal,  no murmur or gallops, no peripheral edema  Abdomen: soft, protuberant, benign  Musculoskeletal: No deformities, no cyanosis or clubbing  Neuro: alert, non focal  Skin: Warm, no lesions or rashes        Assessment & Plan:  Acute sinusitis Suspect this is driving his recurrent sx, cough, mucous.  - will treat with Augmentin 875 bid x 14 days if it is on his drug formulary; will find an alternative if not available.  - restart nasonex qd   Intrinsic asthma No evidence of  a flare at this time. I suspect we have been treating UA irritation with the prednisone. Will continue the symbicort, SABA prn.

## 2014-06-10 ENCOUNTER — Encounter: Payer: Self-pay | Admitting: Internal Medicine

## 2014-06-14 ENCOUNTER — Telehealth: Payer: Self-pay | Admitting: Neurology

## 2014-06-14 ENCOUNTER — Telehealth: Payer: Self-pay | Admitting: *Deleted

## 2014-06-14 NOTE — Telephone Encounter (Signed)
Spoke with [name] and [facility]

## 2014-06-14 NOTE — Telephone Encounter (Signed)
cyclobenzaprine (FLEXERIL) 5 MG tablet called to [id number] with 1 refill  Per [name] request  [facility]

## 2014-06-14 NOTE — Telephone Encounter (Signed)
Pt care taker called Martin Singh wants to speak with someone about medication [phone]

## 2014-06-15 ENCOUNTER — Telehealth: Payer: Self-pay | Admitting: Emergency Medicine

## 2014-06-15 ENCOUNTER — Telehealth: Payer: Self-pay | Admitting: *Deleted

## 2014-06-15 DIAGNOSIS — J309 Allergic rhinitis, unspecified: Secondary | ICD-10-CM

## 2014-06-15 LAB — COMPREHENSIVE METABOLIC PANEL
ALBUMIN: 4 g/dL (ref 3.5–5.2)
ALT: 28 U/L (ref 0–53)
AST: 23 U/L (ref 0–37)
Alkaline Phosphatase: 50 U/L (ref 39–117)
BUN: 18 mg/dL (ref 6–23)
CALCIUM: 9.4 mg/dL (ref 8.4–10.5)
CHLORIDE: 101 meq/L (ref 96–112)
CO2: 27 mEq/L (ref 19–32)
CREATININE: 1.01 mg/dL (ref 0.50–1.35)
GLUCOSE: 104 mg/dL — AB (ref 70–99)
POTASSIUM: 4.3 meq/L (ref 3.5–5.3)
Sodium: 138 mEq/L (ref 135–145)
Total Bilirubin: 0.2 mg/dL (ref 0.2–1.2)
Total Protein: 6.5 g/dL (ref 6.0–8.3)

## 2014-06-15 LAB — CBC
HEMATOCRIT: 35.9 % — AB (ref 39.0–52.0)
HEMOGLOBIN: 11.8 g/dL — AB (ref 13.0–17.0)
MCH: 29.3 pg (ref 26.0–34.0)
MCHC: 32.9 g/dL (ref 30.0–36.0)
MCV: 89.1 fL (ref 78.0–100.0)
Platelets: 428 10*3/uL — ABNORMAL HIGH (ref 150–400)
RBC: 4.03 MIL/uL — AB (ref 4.22–5.81)
RDW: 13 % (ref 11.5–15.5)
WBC: 7.2 10*3/uL (ref 4.0–10.5)

## 2014-06-15 LAB — CARBAMAZEPINE LEVEL, TOTAL: Carbamazepine Lvl: 6.6 ug/mL (ref 4.0–12.0)

## 2014-06-15 NOTE — Telephone Encounter (Signed)
Spoke with [name]. She reports at night, pt coughs a lot. It does not matter the position he sleeps, he still coughs a lot. She wants RB to prescribe a neb TX for bedtime to see if that would help his cough. Please advise RB thanks  Allergies  Allergen Reactions  . Codeine Anaphylaxis  . Benazepril Cough  . Ativan [Lorazepam] Other (See Comments)    Becomes violent and hallucinates  . Corticosteroids Other (See Comments)    Shaky and high blood pressure

## 2014-06-15 NOTE — Telephone Encounter (Signed)
Per SN--  SN cannot prescribe neb tx tonight.  Will have to send this to RB for that.   recs for OTC delsym 2 tsp at bedtime Anti-reflux regimen--  NPO after dinner Elevate the HOB 6 in Take zantac at bedtime with sip of water nexium 30 minutes before dinner

## 2014-06-15 NOTE — Telephone Encounter (Signed)
ATC [name] Chase x 3- phone is off Called and spoke with pt on cell #, aware of rec's per Dr Kriste BasqueNadel  Dr Delton CoombesByrum please advise about nebulizer. Thanks.

## 2014-06-15 NOTE — Telephone Encounter (Signed)
LMTCB x 1 - advised Loni DollyAnn Chase to contact our office back before 5pm as our phones will shut off at this time wcb before we leave today

## 2014-06-15 NOTE — Telephone Encounter (Signed)
I spoke with patient regarding diasbility form and explained to him that from a medical stand Dr Everlena CooperJaffe cannot fill out the forms to justify a disability for seizures

## 2014-06-15 NOTE — Telephone Encounter (Signed)
RB paged x 2 with no response so will have to send to doc of the day  Please advise, thanks

## 2014-06-16 NOTE — Telephone Encounter (Signed)
Called to speak with pt-- unable to reach pt  Automated message, pt not accepting calls at this time wcb

## 2014-06-16 NOTE — Telephone Encounter (Signed)
He has albuterol that he can use prn. He should use this at night if necessary

## 2014-06-17 NOTE — Telephone Encounter (Signed)
ATC Martin Singh. Unable to leave VM.  lmtcb for pt at [phone].

## 2014-06-18 MED ORDER — LEVOCETIRIZINE DIHYDROCHLORIDE 5 MG PO TABS: 5.0000 mg | ORAL_TABLET | Freq: Every evening | ORAL | Status: DC

## 2014-06-18 MED ORDER — IPRATROPIUM BROMIDE 0.03 % NA SOLN: NASAL | Status: DC

## 2014-06-18 NOTE — Telephone Encounter (Signed)
Per Dr. Delton CoombesByrum:  Take Xyzal QD, Benadryl every night, Ipotropromine 0.03% 2 sprays each nostril 2-3 times daily. Rx sent to pharmacy. Caregiver, Loni DollyAnn Chase notified.

## 2014-06-18 NOTE — Telephone Encounter (Signed)
Spoke with Martin Singh States that the patient is not showing any improvement with the Albuterol PRN. Still having increased cough with SOB.  Martin Singh states that he is using as directed below and not showing any change Would like further rec's Requests call back [phone].  Please advise Dr Delton CoombesByrum . Thanks

## 2014-06-18 NOTE — Telephone Encounter (Signed)
Spoke with Martin Singh, she said that patient has been on 2 rounds of Prednisone already, she is afraid that his blood pressure would go up on another round of Prednisone.  She is requesting Albuterol Neb treatments.  Wants to know if he can take every night.  Also has cough, wants to know what

## 2014-06-18 NOTE — Telephone Encounter (Signed)
Treat with pred taper > Take 40mg  daily for 3 days, then 30mg  daily for 3 days, then 20mg  daily for 3 days, then 10mg  daily for 3 days, then stop

## 2014-06-23 ENCOUNTER — Telehealth: Payer: Self-pay | Admitting: Emergency Medicine

## 2014-06-23 DIAGNOSIS — J309 Allergic rhinitis, unspecified: Secondary | ICD-10-CM

## 2014-06-23 MED ORDER — DIPHENHYDRAMINE HCL 25 MG PO TABS: 25.0000 mg | ORAL_TABLET | Freq: Four times a day (QID) | ORAL | Status: DC | PRN

## 2014-06-23 MED ORDER — LEVOCETIRIZINE DIHYDROCHLORIDE 5 MG PO TABS
5.0000 mg | ORAL_TABLET | Freq: Every evening | ORAL | Status: DC
Start: 2014-06-23 — End: 2014-06-29

## 2014-06-23 MED ORDER — IPRATROPIUM BROMIDE 0.03 % NA SOLN: NASAL | Status: DC

## 2014-06-23 NOTE — Telephone Encounter (Signed)
Called and spoke to pt. Pt stated he has not received the medications that were sent to pharmacy last week. [facility] and they stated they do not cary the xyzal or atrovent nasal spray. Called and informed pt. [facility] and if it is too expensive he will call back to see if the meds can be switched to meds [facility]. [facility]. Nothing further needed at this time.

## 2014-06-24 NOTE — Telephone Encounter (Signed)
Patient stopped by the office.  [facility]  [email or web address]--cetirizine and ipratropium.  Please advise.

## 2014-06-24 NOTE — Telephone Encounter (Signed)
Pulmonary note in error by M Hatley-please disregard. Vivianne SpenceKatie Welchel,CMA

## 2014-06-24 NOTE — Telephone Encounter (Signed)
  Lehman PromMelanie Hatley at 06/24/2014 1:50 PM     Status: Signed        Patient stopped by the office. [facility]. [email or web address] will cover--cetirizine and ipratropium. Please advise.   LMTCB for pt.   Will address new message to be started with [facility] on 10/30 when back in office.

## 2014-06-25 ENCOUNTER — Telehealth: Payer: Self-pay | Admitting: Emergency Medicine

## 2014-06-25 NOTE — Telephone Encounter (Signed)
Called spoke with pt spouse. She reports [facility] not [facility] xyzal but will cover cetrizine and wants this called in. Please advise thanks

## 2014-06-28 NOTE — Telephone Encounter (Signed)
Yes, Ok to switch the prescription

## 2014-06-29 MED ORDER — CETIRIZINE HCL 10 MG PO TABS: 10.0000 mg | ORAL_TABLET | Freq: Every day | ORAL | Status: DC

## 2014-06-29 NOTE — Telephone Encounter (Signed)
Spoke with pt and advised that new rx was sent to pharmacy for Cetirizine.

## 2014-07-08 ENCOUNTER — Encounter: Payer: Self-pay | Admitting: Pulmonary Disease

## 2014-07-08 ENCOUNTER — Ambulatory Visit (INDEPENDENT_AMBULATORY_CARE_PROVIDER_SITE_OTHER): Payer: Self-pay | Admitting: Pulmonary Disease

## 2014-07-08 VITALS — BP 124/68 | HR 60 | Temp 98.1°F | Ht 62.0 in | Wt 186.2 lb

## 2014-07-08 DIAGNOSIS — R053 Chronic cough: Secondary | ICD-10-CM

## 2014-07-08 DIAGNOSIS — R05 Cough: Secondary | ICD-10-CM

## 2014-07-08 NOTE — Assessment & Plan Note (Signed)
The patient has ongoing cough that improves with antibiotics and prednisone, but then returns once he discontinues these. He is also having ongoing sinus pressure, owing purulence from his nose, and is having significant postnasal drip. It is really unclear whether this is related to sinusitis versus chronic rhinitis. I think he needs to have a scan of his sinuses, and if clear, would consider a formal allergy evaluation if this has not been done. He understands this may have nothing to do with an infection, and would hold on antibiotics until we can sort this out.

## 2014-07-08 NOTE — Patient Instructions (Signed)
Will schedule for scan of your sinuses today if possible.  Will call you with results, and come up with a treatment plan.

## 2014-07-08 NOTE — Progress Notes (Signed)
   Subjective:    Patient ID: Martin Singh, male    DOB: [birth year], 48 y.o.   MRN: [id number]  HPI The patient comes in today for an acute sick visit. He is normally followed by Dr. Delton CoombesByrum for chronic cough that is felt to be upper airway in origin. The patient has been treated with multiple rounds of antibiotics and prednisone with only partial resolution, and then a return of his symptoms afterwards. He does complain of significant sinus pressure,  is blowing purulence from his nose, and has significant postnasal drip despite aggressive treatment for allergic rhinitis. He has not had his sinuses imaged recently.   Review of Systems  Constitutional: Negative for fever and unexpected weight change.  HENT: Positive for congestion, postnasal drip, rhinorrhea, sinus pressure and sneezing. Negative for dental problem, ear pain, nosebleeds, sore throat and trouble swallowing.   Eyes: Negative for redness and itching.  Respiratory: Positive for cough, chest tightness, shortness of breath and wheezing.   Cardiovascular: Negative for palpitations and leg swelling.  Gastrointestinal: Negative for nausea and vomiting.  Genitourinary: Negative for dysuria.  Musculoskeletal: Negative for joint swelling.  Skin: Negative for rash.  Neurological: Positive for headaches.  Hematological: Does not bruise/bleed easily.  Psychiatric/Behavioral: Negative for dysphoric mood. The patient is not nervous/anxious.        Objective:   Physical Exam Overweight male in no acute distress Nose without purulence or discharge noted.  Nasal mucosa swollen and erythematous Neck without lymphadenopathy or thyromegaly Chest totally clear to auscultation, but very prominent upper airway pseudo wheezes Cardiac exam with regular rate and rhythm Lower extremities without edema, no cyanosis Alert and oriented, moves all 4 extremities.      Assessment & Plan:

## 2014-07-09 ENCOUNTER — Ambulatory Visit (INDEPENDENT_AMBULATORY_CARE_PROVIDER_SITE_OTHER)
Admission: RE | Admit: 2014-07-09 | Discharge: 2014-07-09 | Disposition: A | Discharge status: Home / Self Care | Payer: Self-pay | Source: Ambulatory Visit | Attending: Pulmonary Disease | Admitting: Pulmonary Disease

## 2014-07-09 DIAGNOSIS — R053 Chronic cough: Secondary | ICD-10-CM

## 2014-07-09 DIAGNOSIS — R05 Cough: Secondary | ICD-10-CM

## 2014-07-12 ENCOUNTER — Telehealth: Payer: Self-pay | Admitting: Emergency Medicine

## 2014-07-12 NOTE — Telephone Encounter (Signed)
Spoke with Loni DollyAnn Chase-  Results have been explained to patient (EC), expressed understanding. Nothing further needed.  Result Notes     Notes Recorded by Barbaraann ShareKeith M Clance, MD on 07/09/2014 at 5:30 PM Please let pt know that his sinus scan shows very little sinus disease. I do not think he has a significant sinus infection. This may be all allergy. Will send a copy of this to Dr. Delton CoombesByrum, and he can decide about allergy evaluation or ENT evaluation.   Will send to Dr Delton CoombesByrum to address. Please advise.

## 2014-07-13 NOTE — Telephone Encounter (Signed)
Thanks

## 2014-07-14 ENCOUNTER — Telehealth: Payer: Self-pay | Admitting: Emergency Medicine

## 2014-07-14 ENCOUNTER — Other Ambulatory Visit: Payer: Self-pay | Admitting: *Deleted

## 2014-07-14 MED ORDER — GABAPENTIN 300 MG PO CAPS: 300.0000 mg | ORAL_CAPSULE | Freq: Two times a day (BID) | ORAL | Status: DC

## 2014-07-14 NOTE — Telephone Encounter (Signed)
We will treat him for bronchitis empirically > Levaquin 750 mg daily for 7 days Call in Atrovent 0.06% nasal spray, 2 sprays twice a day when necessary He probably needs to be seen again, although we have tried much of the standard regimen to address  He can have script for tussionex 5cc bid prn if he would like.  He may require an allergy referral for specific allergy testing > as him if he wants a referral Also we can refer him to see ENT if he would like for better evaluation of his sinuses and nasopharynx

## 2014-07-14 NOTE — Telephone Encounter (Signed)
Refill request for gabapentin Last filled by MD on- 03/22/14 #60 x3 Last Appt: 05/28/2014 Next Appt: 08/31/2014 Please advise refill?

## 2014-07-14 NOTE — Telephone Encounter (Signed)
Spoke with Loni DollyAnn Chase.  States that the patient has productive "barky" cough with yellow mucus, head congestion, SOB, hot/cold sweats Denies fever. Current regimen is not working Has tried [employer] Pot, no improvement x 3 months. Reports that pt BP has been running 106/61 -- low for the patient. Recent CT scan Sinus showed no sinusitis -- Dewayne Hatchnn is concerned with possible deviation/cyst/polyp Would like further rec's.  If Rx sent to pharmacy - needs to state 'STAT' on e-script so that med will be delivered ASAP.  Per pt (EC), need to check [email or web address] to ensure that medication is on list of available meds. Go to website, click 'available meds', select 'Non-[place] Resident', medication can be searched to the right. If on list, okay to prescribe.    FYI: Completed 3 rounds of Abx and Pred-- last course completed 2 weeks ago. Allergies  Allergen Reactions  . Codeine Anaphylaxis  . Benazepril Cough  . Ativan [Lorazepam] Other (See Comments)    Becomes violent and hallucinates  . Corticosteroids Other (See Comments)    Shaky and high blood pressure   Please advise Dr Delton CoombesByrum. Thanks.

## 2014-07-14 NOTE — Telephone Encounter (Signed)
Attempted to call the number back for Martin Singh but the phone keeps ringing and no answering machine.  Will hold in triage.

## 2014-07-15 ENCOUNTER — Ambulatory Visit (INDEPENDENT_AMBULATORY_CARE_PROVIDER_SITE_OTHER)
Admission: RE | Admit: 2014-07-15 | Discharge: 2014-07-15 | Disposition: A | Discharge status: Home / Self Care | Payer: Self-pay | Source: Ambulatory Visit | Attending: Emergency Medicine | Admitting: Emergency Medicine

## 2014-07-15 ENCOUNTER — Other Ambulatory Visit: Payer: Self-pay

## 2014-07-15 ENCOUNTER — Ambulatory Visit: Payer: Self-pay | Admitting: Emergency Medicine

## 2014-07-15 ENCOUNTER — Encounter: Payer: Self-pay | Admitting: Emergency Medicine

## 2014-07-15 ENCOUNTER — Other Ambulatory Visit: Payer: Self-pay | Admitting: Emergency Medicine

## 2014-07-15 VITALS — BP 122/66 | HR 77 | Ht 62.0 in | Wt 180.0 lb

## 2014-07-15 DIAGNOSIS — R053 Chronic cough: Secondary | ICD-10-CM

## 2014-07-15 DIAGNOSIS — R05 Cough: Secondary | ICD-10-CM

## 2014-07-15 DIAGNOSIS — R0600 Dyspnea, unspecified: Secondary | ICD-10-CM

## 2014-07-15 MED ORDER — LEVOFLOXACIN 750 MG PO TABS: 750.0000 mg | ORAL_TABLET | Freq: Every day | ORAL | Status: DC

## 2014-07-15 MED ORDER — ESOMEPRAZOLE MAGNESIUM 40 MG PO CPDR: 40.0000 mg | DELAYED_RELEASE_CAPSULE | Freq: Every day | ORAL | Status: DC

## 2014-07-15 MED ORDER — FLUTICASONE PROPIONATE 50 MCG/ACT NA SUSP: 2.0000 | Freq: Every day | NASAL | Status: DC

## 2014-07-15 MED ORDER — PREDNISONE 20 MG PO TABS: 20.0000 mg | ORAL_TABLET | Freq: Every day | ORAL | Status: DC

## 2014-07-15 NOTE — Telephone Encounter (Signed)
[name] calling back [phone]

## 2014-07-15 NOTE — Patient Instructions (Signed)
Please increase your Nexium to 40 mg twice a day Restart fluticasone nasal spray, 2 sprays each nostril twice a day Continue Zyrtec 10 mg once a day Continue to do your nasal saline washes as needed Start prednisone 20 mg daily. We will decide at your next visit when to decrease Lab work today Follow with Dr Delton CoombesByrum in 3 weeks

## 2014-07-15 NOTE — Telephone Encounter (Signed)
Pt SO [name] aware of rec's per RB Has requested we hold off on Atrovent 0.06% at this time. Will increased once runs out of 0.03% Appt scheduled for today with RB at 330 with cxr prior. Aware to arrive at 3pm to have cxr done--check in a main desk 1st. Nothing further needed.

## 2014-07-15 NOTE — Telephone Encounter (Signed)
Spoke with Loni DollyAnn Chase, aware of rec's per RB Levaquin called in to pharmacy and stated as "STAT order" 1---Refused Allergy and ENT referral at this time.  2---Requests that the patient have a cxr done asap-- would like order placed for pt to come by and have one done.  3---Pt cannot afford to get Rx cough syrup at this time, wanting to know a good OTC cough suppressant that will work the same.  4---Pt currently using Nasonex and Atrovent 0.03% -- 2 spray BID-- Dewayne Hatchnn would like to know if it is neccessary to change to 0.06% spray?  Please advise Dr Delton CoombesByrum. Thanks.

## 2014-07-15 NOTE — Progress Notes (Signed)
Subjective:    Patient ID: Martin Singh, male    DOB: 09-Oct-1965, 48 y.o.   MRN: [id number]  HPI 48 yo man, 28 pk-years tobacco, seizures, depression, EtOH and substance use, HH. Presents for eval of SOB that has been noticeable for almost 20 years. He was dx with suspected asthma at that time.  He stopped smoking about 20 yrs ago. He is having severe DOE with minimal exertion. He is having higher BP and HA after atenolol stopped recently.  He uses albuterol about 1-2x a day. Has a regular dry cough. Has some exertional wheeze. He snores and has witnessed apneas.  He has a lot of sinus drainage, congestion, some bleeding.  Has GERD that is managed by nexium.   He was seen in the ED on 10/18/13 for leg pain and edema and intermittent shortness of breath after a long bus ride / plane ride. His mother had passed away. Workup for PE was negative. He returned to the ED on 10/30/13 for back pain after he fell due to slipping down the stairs. XR of lumbar spine revealed mild degenerative changes at L5-S1 but no acute fractures. He has been taking naproxen a couple of times a day.   ROV 12/08/13 -- follows up for dyspnea, sinus disease. 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 He is on flonase.   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 Last time we started Symbicort > returns to discuss. He was less SOB after starting. For the last week he has been having more cough, white mucous, some fever, fatigue. 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  He has been having fever, PND, cough prod of tan mucous, HA, nausea, beginning 7+ days ago. His PSG showed some obstruction but no formal OSA.   ROV 06/09/14 -- COPD/asthma. His AFL is mild and sx would appear to be out of proportion. He has had frequent flares of apparent  bronchitis, purulent cough. He has a lot of yellow sinus drainage with HA, sinus pressure. He has been treated with abx and pred x2 since last visit. He is using benadryl, nexium, zantac. He is on symbicort + SABA, uses .   ROV 07/15/14 - COPD and asthma, mild AFL, frequent flares of apparent bronchitis, purulent cough. He had a sinus CT that was negative for sinusitis on 07/09/14, there was a mucosal retention cyst versus polyp in the right maxillary sinus and a small amount of fluid in the right ethmoid air cell. He has only had relief when on prednisone. He has throat wheezing, dry cough. Not currently on fluticasone, not sure if he is taking the zyrtec. He has breakthrough GERD and also has a significant dust exposure at home.    Review of Systems  Constitutional: Negative for fever and unexpected weight change.  HENT: Positive for postnasal drip and rhinorrhea. Negative for congestion, dental problem, ear pain, nosebleeds, sinus pressure, sneezing, sore throat and trouble swallowing.   Eyes: Negative for redness and itching.  Respiratory: Positive for cough, chest tightness and shortness of breath. Negative for wheezing.   Cardiovascular: Negative for palpitations and leg swelling.  Gastrointestinal: Negative for nausea and vomiting.  Genitourinary: Negative for dysuria.  Musculoskeletal: Negative for joint swelling.  Skin: Negative for rash.  Neurological: Negative for headaches.  Hematological: Does not  bruise/bleed easily.  Psychiatric/Behavioral: Negative for dysphoric mood. The patient is not nervous/anxious.         Objective:   Physical Exam Filed Vitals:   07/15/14 1546  BP: 122/66  Pulse: 77  Height: 5\' 2"  (1.575 m)  Weight: 180 lb (81.647 kg)  SpO2: 94%   Gen: Pleasant, well-nourished, in no distress,  normal affect  ENT: No lesions,  mouth clear,  oropharynx clear, no postnasal drip  Neck: No JVD, no TMG, no carotid bruits  Lungs: No use of accessory muscles,  significant dry cough  Cardiovascular: RRR, heart sounds normal, no murmur or gallops, no peripheral edema  Abdomen: soft, protuberant, benign  Musculoskeletal: No deformities, no cyanosis or clubbing  Neuro: alert, non focal  Skin: Warm, no lesions or rashes        Assessment & Plan:  No problem-specific assessment & plan notes found for this encounter.

## 2014-07-15 NOTE — Telephone Encounter (Signed)
OK to order CXR, although I want to treat w levaquin no matter ehat the CXR shows The atrovent 0.06% is stronger. He can keep the 0.03% if he wants to, then discuss increasing when it runs out.  Delsym is the best OTC cough suppressant It sounds like he needs to be seen. Not clear to me how much of what he is experiencing is chronic vs acute

## 2014-07-16 ENCOUNTER — Telehealth: Payer: Self-pay | Admitting: Neurology

## 2014-07-16 ENCOUNTER — Other Ambulatory Visit: Payer: Self-pay | Admitting: *Deleted

## 2014-07-16 ENCOUNTER — Other Ambulatory Visit: Payer: Self-pay | Admitting: Neurology

## 2014-07-16 LAB — EOSINOPHIL COUNT: EOS ABS: 0.8 10*3/uL — AB (ref 0.0–0.7)

## 2014-07-16 LAB — IGE: IgE (Immunoglobulin E), Serum: 194 kU/L — ABNORMAL HIGH (ref <115)

## 2014-07-16 MED ORDER — CARBAMAZEPINE ER 200 MG PO TB12
400.0000 mg | ORAL_TABLET | Freq: Two times a day (BID) | ORAL | Status: DC
Start: 2014-07-16 — End: 2014-10-18

## 2014-07-16 MED ORDER — CYCLOBENZAPRINE HCL 5 MG PO TABS: ORAL_TABLET | ORAL | Status: DC

## 2014-07-16 MED ORDER — CARBAMAZEPINE ER 200 MG PO TB12: 400.0000 mg | ORAL_TABLET | Freq: Two times a day (BID) | ORAL | Status: DC

## 2014-07-16 NOTE — Telephone Encounter (Signed)
[name] chase care taker called and states that pt should be getting a 90 day supply for the cyclobenaprine 5 mg. They state they are only getting 30 pills please write a new rx  [name] phone is [phone]

## 2014-07-19 ENCOUNTER — Other Ambulatory Visit: Payer: Self-pay

## 2014-07-27 ENCOUNTER — Telehealth: Payer: Self-pay | Admitting: Internal Medicine

## 2014-07-27 MED ORDER — HYDROCHLOROTHIAZIDE 25 MG PO TABS: 25.0000 mg | ORAL_TABLET | Freq: Every day | ORAL | Status: DC

## 2014-07-27 NOTE — Telephone Encounter (Signed)
Caller name: Martin Singh, Raylin Relation to pt:se;f Call back number:[phone] [email or web address]  Reason for call: pt states he has appt on 12/12, however he is needing his bp meds hydrochlorothiazide (HYDRODIURIL) 25 MG tablet and colonidine hcl-0.1 mg. Please send electronically stat.

## 2014-07-27 NOTE — Telephone Encounter (Signed)
Pt called requesting refill on Clonidine. Clonidine no longer on current medication list. Discontinued by Dr. Drue NovelPaz at last OV on 05/28/2014. Please advise.    Hydrochloroth[facility]ide [email or web address] as requested.

## 2014-07-27 NOTE — Telephone Encounter (Signed)
Check with patient and see if he is still taking it and if so if he tried to go without it and then restarted because his pressure went off or did he never stop it?

## 2014-07-28 ENCOUNTER — Encounter: Payer: Self-pay | Admitting: Emergency Medicine

## 2014-07-28 ENCOUNTER — Ambulatory Visit (INDEPENDENT_AMBULATORY_CARE_PROVIDER_SITE_OTHER): Payer: Self-pay | Admitting: Emergency Medicine

## 2014-07-28 VITALS — BP 128/68 | HR 71 | Temp 97.0°F | Ht 62.0 in | Wt 180.0 lb

## 2014-07-28 DIAGNOSIS — J4521 Mild intermittent asthma with (acute) exacerbation: Secondary | ICD-10-CM

## 2014-07-28 MED ORDER — CLONIDINE HCL 0.1 MG PO TABS: 0.1000 mg | ORAL_TABLET | Freq: Two times a day (BID) | ORAL | Status: DC

## 2014-07-28 NOTE — Progress Notes (Signed)
Subjective:    Patient ID: Martin Singh, male    DOB: April 14, 1966, 48 y.o.   MRN: [id number]  HPI 48 yo man, 28 pk-years tobacco, seizures, depression, EtOH and substance use, HH. Presents for eval of SOB that has been noticeable for almost 20 years. He was dx with suspected asthma at that time.  He stopped smoking about 20 yrs ago. He is having severe DOE with minimal exertion. He is having higher BP and HA after atenolol stopped recently.  He uses albuterol about 1-2x a day. Has a regular dry cough. Has some exertional wheeze. He snores and has witnessed apneas.  He has a lot of sinus drainage, congestion, some bleeding.  Has GERD that is managed by nexium.   He was seen in the ED on 10/18/13 for leg pain and edema and intermittent shortness of breath after a long bus ride / plane ride. His mother had passed away. Workup for PE was negative. He returned to the ED on 10/30/13 for back pain after he fell due to slipping down the stairs. XR of lumbar spine revealed mild degenerative changes at L5-S1 but no acute fractures. He has been taking naproxen a couple of times a day.   ROV 12/08/13 -- follows up for dyspnea, sinus disease. 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 He is on flonase.   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 Last time we started Symbicort > returns to discuss. He was less SOB after starting. For the last week he has been having more cough, white mucous, some fever, fatigue. 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  He has been having fever, PND, cough prod of tan mucous, HA, nausea, beginning 7+ days ago. His PSG showed some obstruction but no formal OSA.   ROV 06/09/14 -- COPD/asthma. His AFL is mild and sx would appear to be out of proportion. He has had frequent flares of apparent  bronchitis, purulent cough. He has a lot of yellow sinus drainage with HA, sinus pressure. He has been treated with abx and pred x2 since last visit. He is using benadryl, nexium, zantac. He is on symbicort + SABA, uses .   ROV 07/15/14 - COPD and asthma, mild AFL, frequent flares of apparent bronchitis, purulent cough. He had a sinus CT that was negative for sinusitis on 07/09/14, there was a mucosal retention cyst versus polyp in the right maxillary sinus and a small amount of fluid in the right ethmoid air cell. He has only had relief when on prednisone. He has throat wheezing, dry cough. Not currently on fluticasone, not sure if he is taking the zyrtec. He has breakthrough GERD and also has a significant dust exposure at home.   ROV 07/28/14 -- COPD and asthma, mild AFL, frequent flares of apparent bronchitis, purulent cough. Last time we restarted GERD and allergy regimens, started pred 20. He still has cough but is much better. He is back on nexium, is on zyrtec. Doesn't have fluticasone yet - needs to change to nasonex for formulary.    Review of Systems  Constitutional: Negative for fever and unexpected weight change.  HENT: Positive for postnasal drip and rhinorrhea. Negative for congestion, dental problem, ear pain, nosebleeds, sinus pressure, sneezing, sore throat and trouble swallowing.   Eyes: Negative for redness and  itching.  Respiratory: Positive for cough, chest tightness and shortness of breath. Negative for wheezing.   Cardiovascular: Negative for palpitations and leg swelling.  Gastrointestinal: Negative for nausea and vomiting.  Genitourinary: Negative for dysuria.  Musculoskeletal: Negative for joint swelling.  Skin: Negative for rash.  Neurological: Negative for headaches.  Hematological: Does not bruise/bleed easily.  Psychiatric/Behavioral: Negative for dysphoric mood. The patient is not nervous/anxious.         Objective:   Physical Exam Filed Vitals:   07/28/14  1415  BP: 128/68  Pulse: 71  Temp: 97 F (36.1 C)  TempSrc: Oral  Height: 5\' 2"  (1.575 m)  Weight: 180 lb (81.647 kg)  SpO2: 99%   Gen: Pleasant, well-nourished, in no distress,  normal affect  ENT: No lesions,  mouth clear,  oropharynx clear, no postnasal drip  Neck: No JVD, no TMG, no carotid bruits  Lungs: No use of accessory muscles, significant dry cough  Cardiovascular: RRR, heart sounds normal, no murmur or gallops, no peripheral edema  Abdomen: soft, protuberant, benign  Musculoskeletal: No deformities, no cyanosis or clubbing  Neuro: alert, non focal  Skin: Warm, no lesions or rashes        Assessment & Plan:  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 I believe he would be a good candidate for Xolair.   Please continue prednisone 20 mg daily for now Continue your other medications as you have been taking them We will stop fluticasone nasal spray and change to Nasonex 2 sprays each nostril once a day. A stat prescription will be sent to your mail in pharmacy We will complete the enrollment paperwork for Xolair Follow with Dr Delton CoombesByrum in 1 month

## 2014-07-28 NOTE — Telephone Encounter (Signed)
Clonidine was discontinued in "error" by Dr. Drue NovelPaz on 05/28/2014. Added back to med list and refilled for 30 days since Pt has appt with Dr. Drue NovelPaz on 12/12. Pt and Dr. Drue NovelPaz can discuss medication then.

## 2014-07-28 NOTE — Patient Instructions (Signed)
Please continue prednisone 20 mg daily for now Continue your other medications as you have been taking them We will stop fluticasone nasal spray and change to Nasonex 2 sprays each nostril once a day. A stat prescription will be sent to your mail in pharmacy We will complete the enrollment paperwork for Xolair Follow with Dr Delton CoombesByrum in 1 month

## 2014-07-28 NOTE — Assessment & Plan Note (Signed)
Eosinophils and IgE are both elevated. I believe he would be a good candidate for Xolair.   Please continue prednisone 20 mg daily for now Continue your other medications as you have been taking them We will stop fluticasone nasal spray and change to Nasonex 2 sprays each nostril once a day. A stat prescription will be sent to your mail in pharmacy We will complete the enrollment paperwork for Xolair Follow with Dr Delton CoombesByrum in 1 month

## 2014-07-30 ENCOUNTER — Ambulatory Visit: Payer: Self-pay | Admitting: Internal Medicine

## 2014-08-02 ENCOUNTER — Telehealth: Payer: Self-pay | Admitting: Emergency Medicine

## 2014-08-02 NOTE — Telephone Encounter (Signed)
Spoke with pt's caregiver, states that pt has had prod cough with yellow mucus X3 months, has had a fever of 101 X2 days, getting hoarse, sore throat.  States pt has been on prednisone which is not helping.  Is requesting recs.   Last visit:07/28/14 Next visit: 09/09/14  Dr. Delton Coombesbyrum please advise.  Thanks!  Allergies  Allergen Reactions  . Codeine Anaphylaxis  . Benazepril Cough  . Ativan [Lorazepam] Other (See Comments)    Becomes violent and hallucinates  . Corticosteroids Other (See Comments)    Shaky and high blood pressure   Current Outpatient Prescriptions on File Prior to Visit  Medication Sig Dispense Refill  . albuterol (PROVENTIL HFA;VENTOLIN HFA) 108 (90 BASE) MCG/ACT inhaler Inhale 2 puffs into the lungs every 6 (six) hours as needed for wheezing or shortness of breath. 1 Inhaler 2  . aspirin 81 MG chewable tablet Chew 81 mg by mouth daily.    Marland Kitchen. atenolol (TENORMIN) 100 MG tablet Take 1 tablet (100 mg total) by mouth daily. 30 tablet 2  . budesonide-formoterol (SYMBICORT) 160-4.5 MCG/ACT inhaler Inhale 2 puffs into the lungs 2 (two) times daily. 1 Inhaler 2  . carbamazepine (TEGRETOL XR) 200 MG 12 hr tablet Take 2 tablets (400 mg total) by mouth 2 (two) times daily. 360 tablet 1  . cetirizine (ZYRTEC) 10 MG tablet Take 1 tablet (10 mg total) by mouth daily. 30 tablet 6  . cloNIDine (CATAPRES) 0.1 MG tablet Take 1 tablet (0.1 mg total) by mouth 2 (two) times daily. 60 tablet 0  . cyclobenzaprine (FLEXERIL) 5 MG tablet Take 1-2tabs TID prn 90 tablet 1  . diphenhydrAMINE (BENADRYL) 25 MG tablet Take 1 tablet (25 mg total) by mouth every 6 (six) hours as needed for allergies. 30 tablet 5  . esomeprazole (NEXIUM) 40 MG capsule Take 1 capsule (40 mg total) by mouth daily at 12 noon. 60 capsule 2  . fesoterodine (TOVIAZ) 4 MG TB24 tablet Take 4 mg by mouth daily.    Marland Kitchen. FLUoxetine (PROZAC) 20 MG capsule Take 3 capsules (60 mg total) by mouth at bedtime. 180 capsule 2  . gabapentin  (NEURONTIN) 300 MG capsule Take 1 capsule (300 mg total) by mouth 2 (two) times daily. 60 capsule 3  . hydrochlorothiazide (HYDRODIURIL) 25 MG tablet Take 1 tablet (25 mg total) by mouth daily. 30 tablet 2  . hydrOXYzine (VISTARIL) 25 MG capsule Take 1 capsule (25 mg total) by mouth 2 (two) times daily. 60 capsule 3  . ibuprofen (ADVIL,MOTRIN) 200 MG tablet Take 400 mg by mouth every 6 (six) hours as needed for moderate pain.    Marland Kitchen. ipratropium (ATROVENT) 0.03 % nasal spray Two sprays each nostril 2-3 times daily prn. 30 mL 5  . levofloxacin (LEVAQUIN) 750 MG tablet Take 1 tablet (750 mg total) by mouth daily. (STAT) 7 tablet 0  . methocarbamol (ROBAXIN) 500 MG tablet Take 1 tablet (500 mg total) by mouth 2 (two) times daily. 20 tablet 0  . mometasone (NASONEX) 50 MCG/ACT nasal spray Place 2 sprays into the nose daily. 17 g 11  . Multiple Vitamin (MULTIVITAMIN) tablet Take 1 tablet by mouth daily.    Marland Kitchen. nystatin cream (MYCOSTATIN) Apply 1 application topically 2 (two) times daily.    . predniSONE (DELTASONE) 20 MG tablet Take 1 tablet (20 mg total) by mouth daily with breakfast. 30 tablet 1  . senna-docusate (SENNA-S) 8.6-50 MG per tablet Take 1 tablet by mouth daily.    Marland Kitchen. topiramate (TOPAMAX) 25  MG tablet Take 1 tablet (25 mg total) by mouth daily. 120 tablet 3  . triamcinolone cream (KENALOG) 0.1 % Apply 1 application topically 2 (two) times daily.     No current facility-administered medications on file prior to visit.

## 2014-08-02 NOTE — Telephone Encounter (Signed)
We just had an OV, made plans. I am not going to change his meds at this time.  I believe this is a chronic management issue and we are trying to modify the management to get under control.

## 2014-08-02 NOTE — Telephone Encounter (Signed)
Called and spoke to pt's EC, [name]. Informed [name] of the recs per RB. [name] verbalized understanding and stated she would keep the 12/15 appt if pt worsens from now till then. If pt doesn't worsen then they will cancel the 12/15 appt and keep the 09/09/14 appt. Nothing further needed.

## 2014-08-05 IMAGING — CR DG CHEST 2V
2 series · 2 of 2 positions shown · non-contrast
Comparison: 12/20/2012, 12/13/2012, 06/10/2012, and 11/19/2011

CLINICAL DATA: Chest pain and shortness of breath and leg swelling
for 1 month.

EXAM:
CHEST  2 VIEW

[w chest pa]
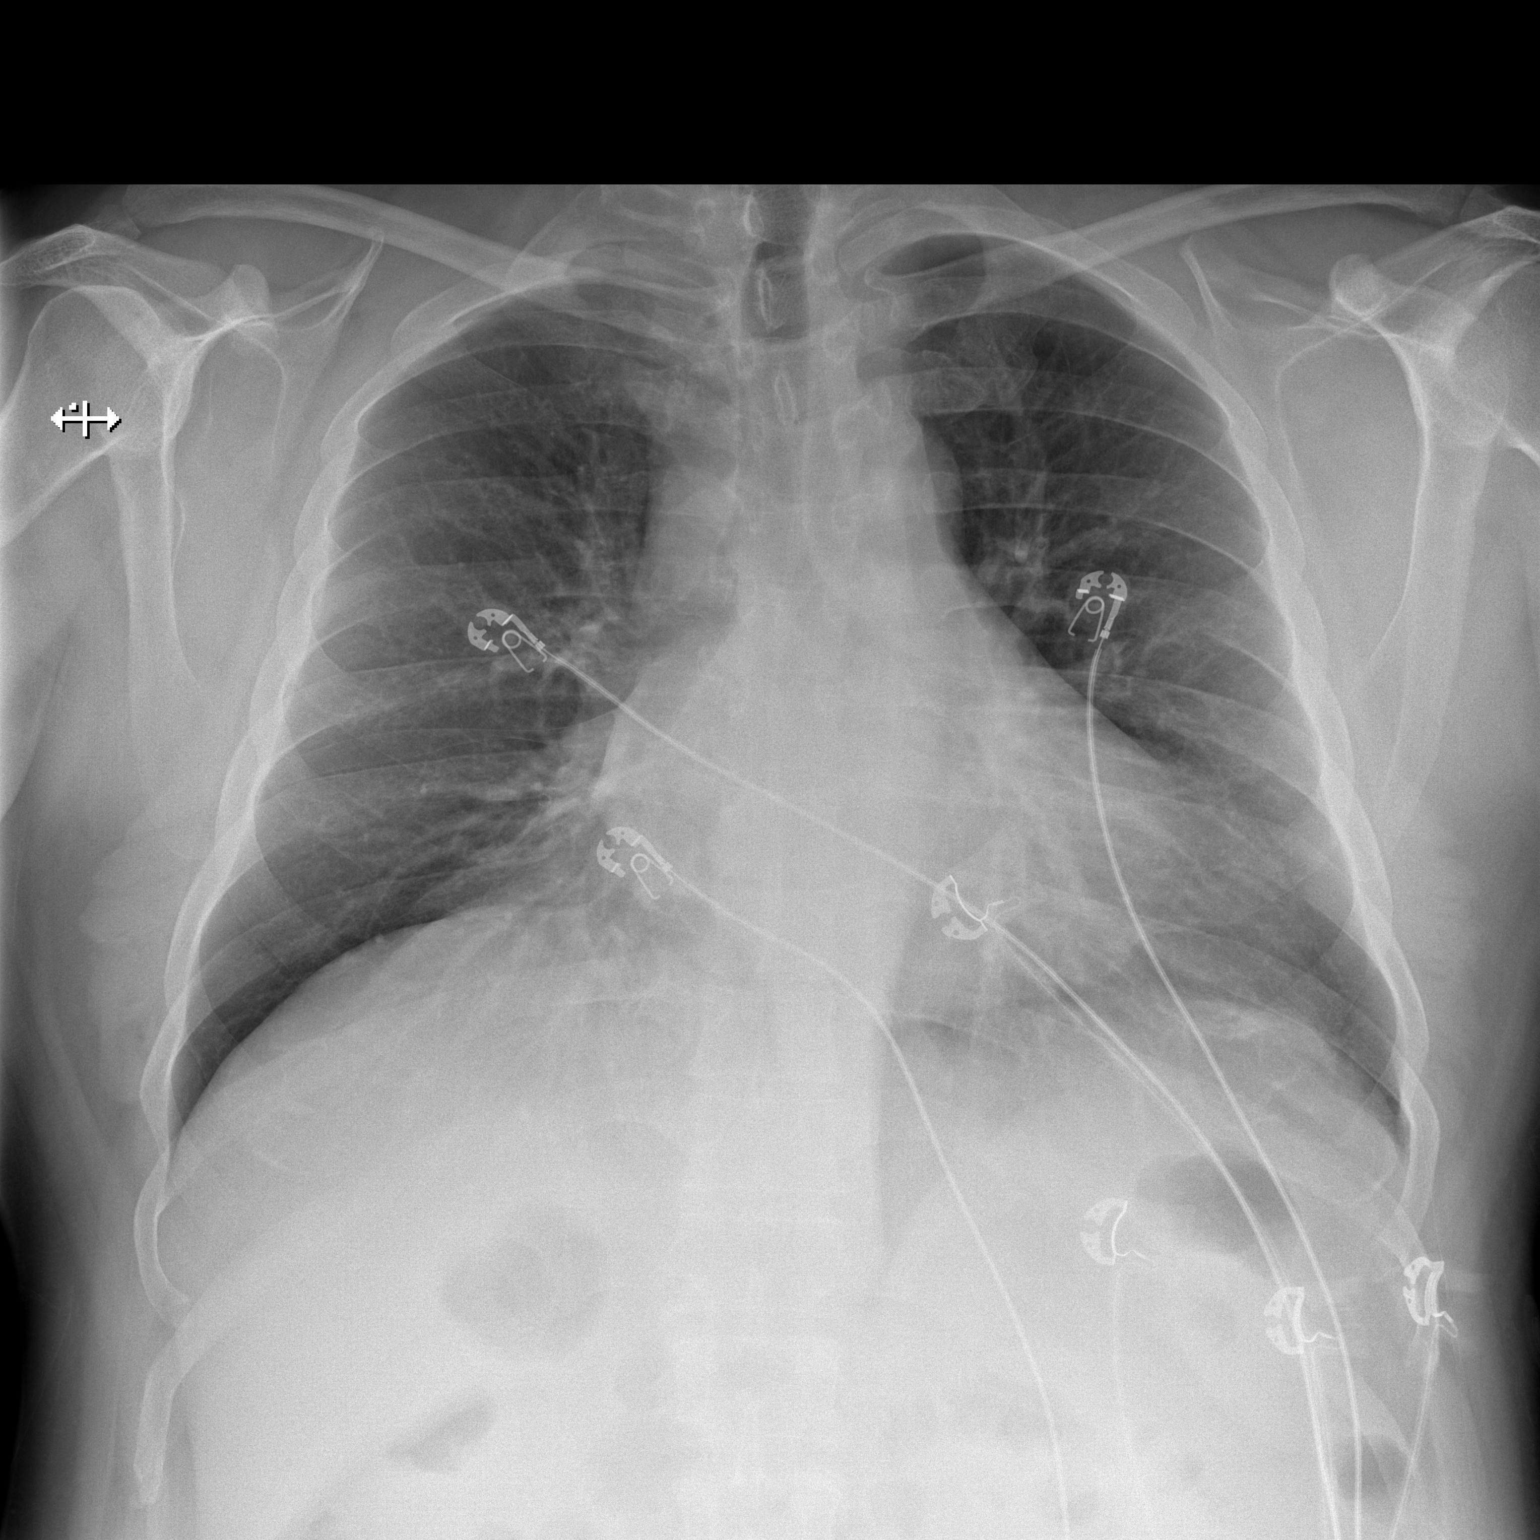

[w chest lat]
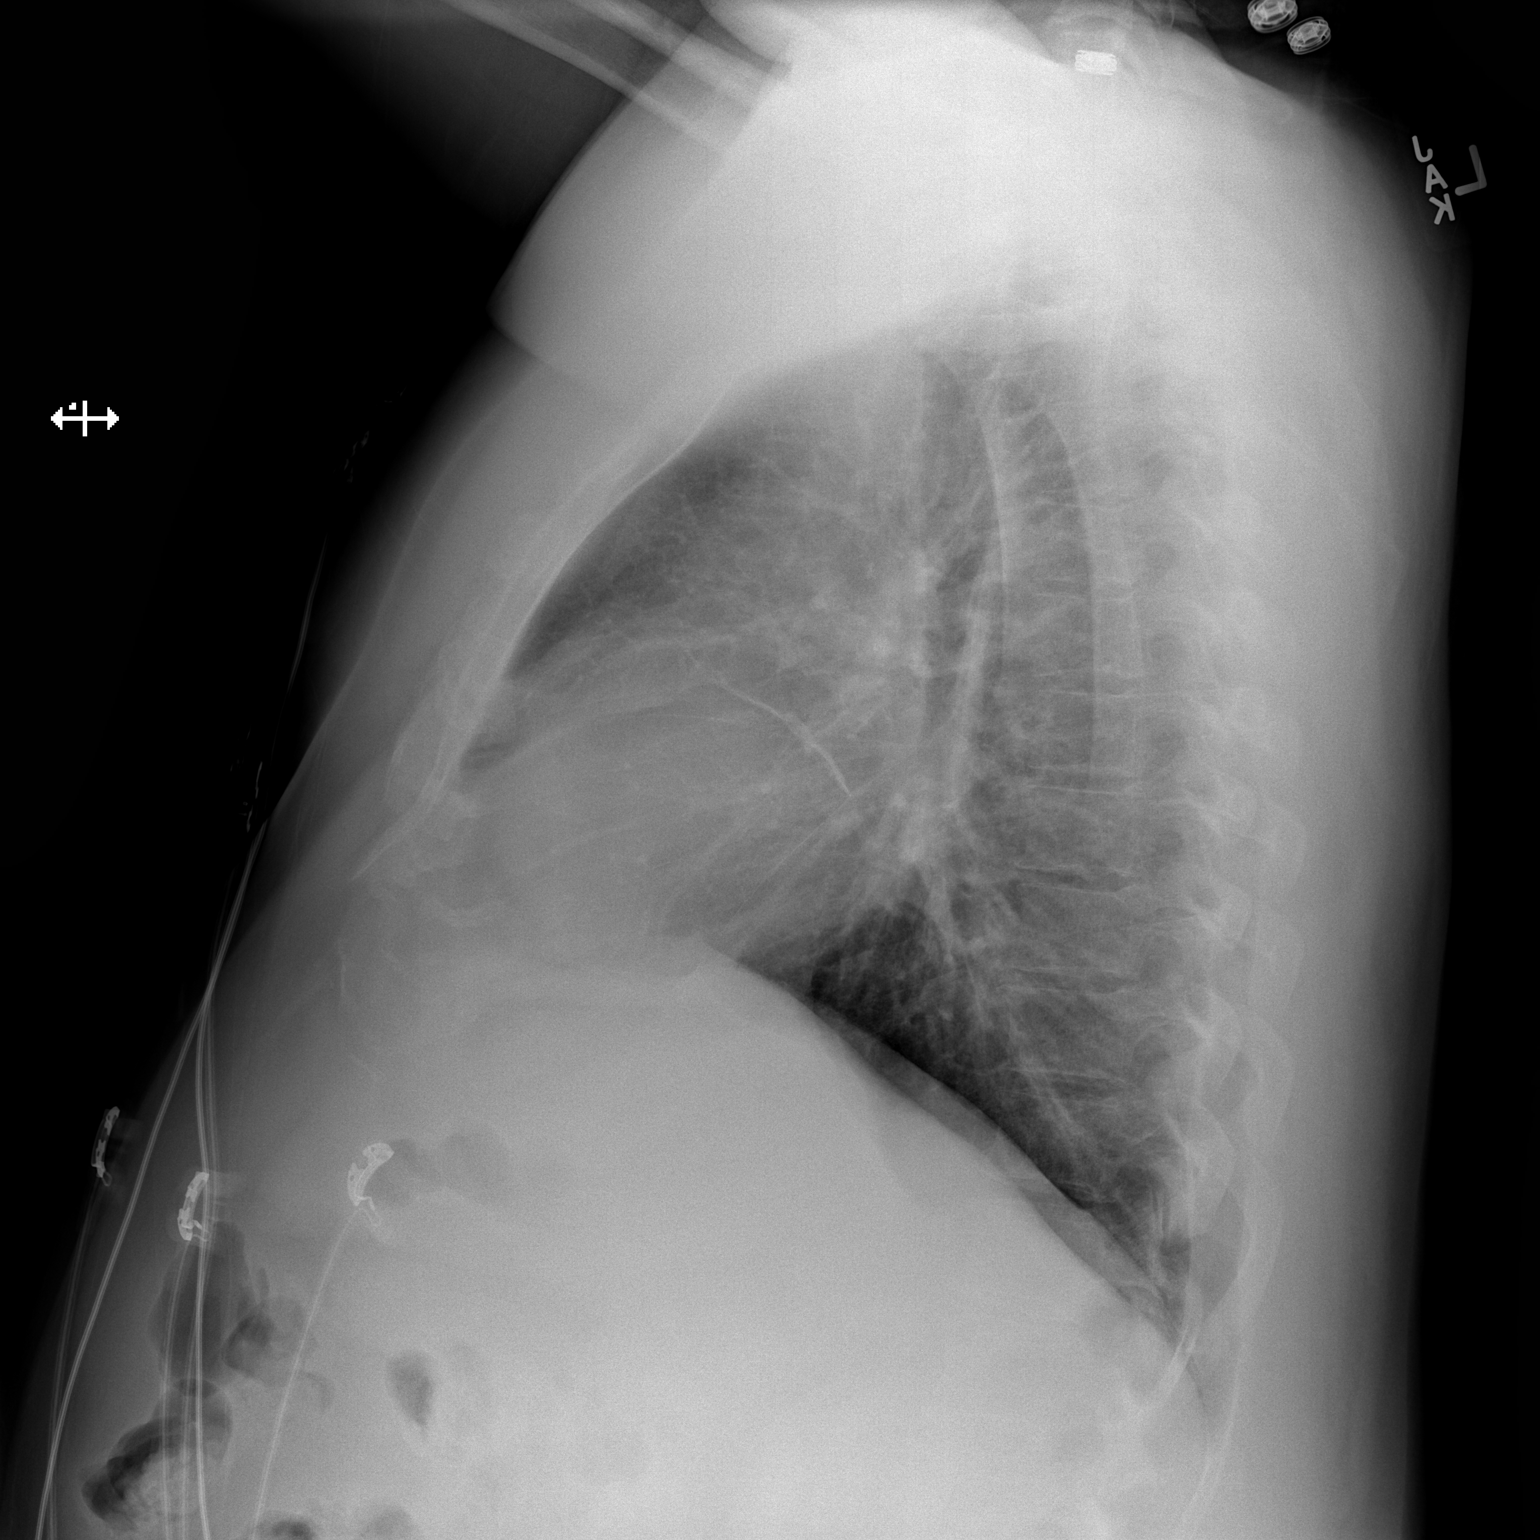

[2 of 2 positions shown; findings below may reference images not displayed]

FINDINGS: Heart size and pulmonary vascularity are normal and the lungs are
clear except for minimal linear atelectasis visible on the lateral
view. No osseous abnormality of significance.
IMPRESSION: Minimal atelectasis as described.  Otherwise, normal exam.

## 2014-08-10 ENCOUNTER — Ambulatory Visit (INDEPENDENT_AMBULATORY_CARE_PROVIDER_SITE_OTHER): Payer: Self-pay | Admitting: Emergency Medicine

## 2014-08-10 ENCOUNTER — Encounter: Payer: Self-pay | Admitting: Emergency Medicine

## 2014-08-10 ENCOUNTER — Ambulatory Visit (INDEPENDENT_AMBULATORY_CARE_PROVIDER_SITE_OTHER): Payer: Self-pay

## 2014-08-10 ENCOUNTER — Ambulatory Visit: Payer: Self-pay

## 2014-08-10 VITALS — BP 132/80 | HR 67 | Ht 62.0 in | Wt 182.0 lb

## 2014-08-10 DIAGNOSIS — J4521 Mild intermittent asthma with (acute) exacerbation: Secondary | ICD-10-CM

## 2014-08-10 DIAGNOSIS — J4520 Mild intermittent asthma, uncomplicated: Secondary | ICD-10-CM

## 2014-08-10 NOTE — Progress Notes (Signed)
Subjective:    Patient ID: Martin Singh, male    DOB: [birth year], 48 y.o.   MRN: [id number]  HPI 48 yo man, 28 pk-years tobacco, seizures, depression, EtOH and substance use, HH. Presents for eval of SOB that has been noticeable for almost 20 years. He was dx with suspected asthma at that time.  He stopped smoking about 20 yrs ago. He is having severe DOE with minimal exertion. He is having higher BP and HA after atenolol stopped recently.  He uses albuterol about 1-2x a day. Has a regular dry cough. Has some exertional wheeze. He snores and has witnessed apneas.  He has a lot of sinus drainage, congestion, some bleeding.  Has GERD that is managed by nexium.   He was seen in the ED on 10/18/13 for leg pain and edema and intermittent shortness of breath after a long bus ride / plane ride. His mother had passed away. Workup for PE was negative. He returned to the ED on 10/30/13 for back pain after he fell due to slipping down the stairs. XR of lumbar spine revealed mild degenerative changes at L5-S1 but no acute fractures. He has been taking naproxen a couple of times a day.   ROV 12/08/13 -- follows up for dyspnea, sinus disease. 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 He is on flonase.   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 Last time we started Symbicort > returns to discuss. He was less SOB after starting. For the last week he has been having more cough, white mucous, some fever, fatigue. 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  He has been having fever, PND, cough prod of tan mucous, HA, nausea, beginning 7+ days ago. His PSG showed some obstruction but no formal OSA.   ROV 06/09/14 -- COPD/asthma. His AFL is mild and sx would appear to be out of proportion. He has had frequent flares of apparent  bronchitis, purulent cough. He has a lot of yellow sinus drainage with HA, sinus pressure. He has been treated with abx and pred x2 since last visit. He is using benadryl, nexium, zantac. He is on symbicort + SABA, uses .   ROV 07/15/14 - COPD and asthma, mild AFL, frequent flares of apparent bronchitis, purulent cough. He had a sinus CT that was negative for sinusitis on 07/09/14, there was a mucosal retention cyst versus polyp in the right maxillary sinus and a small amount of fluid in the right ethmoid air cell. He has only had relief when on prednisone. He has throat wheezing, dry cough. Not currently on fluticasone, not sure if he is taking the zyrtec. He has breakthrough GERD and also has a significant dust exposure at home.   ROV 07/28/14 -- COPD and asthma, mild AFL, frequent flares of apparent bronchitis, purulent cough. Last time we restarted GERD and allergy regimens, started pred 20. He still has cough but is much better. He is back on nexium, is on zyrtec. Doesn't have fluticasone yet - needs to change to nasonex for formulary.   ROV 08/10/14 -- COPD and asthma, mild AFL, frequent flares of apparent bronchitis, purulent cough. He is on Pred 20mg  right now. He was temporarily better when we treated with levaquin - now back at his baseline > lot of cough, mucous, ear fullness.  Review of Systems  Constitutional: Negative for fever and unexpected weight change.  HENT: Positive for postnasal drip and rhinorrhea. Negative for congestion, dental problem, ear pain, nosebleeds, sinus pressure, sneezing, sore throat and trouble swallowing.   Eyes: Negative for redness and itching.  Respiratory: Positive for cough, chest tightness and shortness of breath. Negative for wheezing.   Cardiovascular: Negative for palpitations and leg swelling.  Gastrointestinal: Negative for nausea and vomiting.  Genitourinary: Negative for dysuria.  Musculoskeletal: Negative for joint swelling.  Skin: Negative for  rash.  Neurological: Negative for headaches.  Hematological: Does not bruise/bleed easily.  Psychiatric/Behavioral: Negative for dysphoric mood. The patient is not nervous/anxious.         Objective:   Physical Exam Filed Vitals:   08/10/14 1337  BP: 132/80  Pulse: 67  Height: 5\' 2"  (1.575 m)  Weight: 182 lb (82.555 kg)  SpO2: 98%   Gen: Pleasant, well-nourished, in no distress,  normal affect  ENT: No lesions,  mouth clear,  oropharynx clear, no postnasal drip  Neck: No JVD, no TMG, no carotid bruits  Lungs: No use of accessory muscles, significant dry cough  Cardiovascular: RRR, heart sounds normal, no murmur or gallops, no peripheral edema  Abdomen: soft, protuberant, benign  Musculoskeletal: No deformities, no cyanosis or clubbing  Neuro: alert, non focal  Skin: Warm, no lesions or rashes       Assessment & Plan:  Intrinsic asthma Refractory asthma and bronchitis. Allergies.  - will continue the maintenance meds as ordered - will start xolair 300 every month.  - rov 1 month.

## 2014-08-10 NOTE — Patient Instructions (Signed)
Please continue all of you maintenance medications as you have been taking them.  We will start xolair treatments today and continue per schedule.  Follow with Dr Delton CoombesByrum in 1 month

## 2014-08-10 NOTE — Assessment & Plan Note (Signed)
Refractory asthma and bronchitis. Allergies.  - will continue the maintenance meds as ordered - will start xolair 300 every month.  - rov 1 month.

## 2014-08-13 ENCOUNTER — Telehealth: Payer: Self-pay | Admitting: Internal Medicine

## 2014-08-13 ENCOUNTER — Telehealth: Payer: Self-pay | Admitting: Emergency Medicine

## 2014-08-13 MED ORDER — OMALIZUMAB 150 MG ~~LOC~~ SOLR
300.0000 mg | Freq: Once | SUBCUTANEOUS | Status: AC
  Administered 2014-08-13: 300 mg via SUBCUTANEOUS

## 2014-08-13 NOTE — Telephone Encounter (Signed)
Called spoke with pt. Aware of recs.  Nothing further needed 

## 2014-08-13 NOTE — Telephone Encounter (Signed)
We briefly discussed. The only thing I really have to offer is decongestants and or anti-histamines. He can try OTC

## 2014-08-13 NOTE — Telephone Encounter (Signed)
Ok with me, thx 

## 2014-08-13 NOTE — Telephone Encounter (Signed)
Spoke with Martin Singh  She states pt was just seen on 08/10/14 and the problem with his ear was not address "with everything else that was going on" He c/o pain in left ear No drainage or trouble hearing  I advised contact PCP for this, but she refused, stating that RB told them that it was connected to his ongoing sinusitis  RB, please advise, thanks!

## 2014-08-13 NOTE — Telephone Encounter (Signed)
Pt request to transfer from Dr. Drue NovelPaz to [facility]. Please advise, it is difficult for pt to go out to Dr. Drue NovelPaz office.

## 2014-08-14 ENCOUNTER — Emergency Department (HOSPITAL_COMMUNITY): Payer: Self-pay

## 2014-08-14 ENCOUNTER — Emergency Department (HOSPITAL_COMMUNITY)
Admission: EM | Admit: 2014-08-14 | Discharge: 2014-08-14 | Disposition: A | Discharge status: Home / Self Care | Payer: Self-pay | Attending: Emergency Medicine | Admitting: Emergency Medicine

## 2014-08-14 ENCOUNTER — Encounter (HOSPITAL_COMMUNITY): Payer: Self-pay | Admitting: *Deleted

## 2014-08-14 DIAGNOSIS — K219 Gastro-esophageal reflux disease without esophagitis: Secondary | ICD-10-CM | POA: Insufficient documentation

## 2014-08-14 DIAGNOSIS — R0981 Nasal congestion: Secondary | ICD-10-CM | POA: Insufficient documentation

## 2014-08-14 DIAGNOSIS — Z87891 Personal history of nicotine dependence: Secondary | ICD-10-CM | POA: Insufficient documentation

## 2014-08-14 DIAGNOSIS — H9192 Unspecified hearing loss, left ear: Secondary | ICD-10-CM | POA: Insufficient documentation

## 2014-08-14 DIAGNOSIS — R0982 Postnasal drip: Secondary | ICD-10-CM | POA: Insufficient documentation

## 2014-08-14 DIAGNOSIS — J45909 Unspecified asthma, uncomplicated: Secondary | ICD-10-CM | POA: Insufficient documentation

## 2014-08-14 DIAGNOSIS — Z8639 Personal history of other endocrine, nutritional and metabolic disease: Secondary | ICD-10-CM | POA: Insufficient documentation

## 2014-08-14 DIAGNOSIS — M810 Age-related osteoporosis without current pathological fracture: Secondary | ICD-10-CM | POA: Insufficient documentation

## 2014-08-14 DIAGNOSIS — R05 Cough: Secondary | ICD-10-CM | POA: Insufficient documentation

## 2014-08-14 DIAGNOSIS — I10 Essential (primary) hypertension: Secondary | ICD-10-CM | POA: Insufficient documentation

## 2014-08-14 DIAGNOSIS — G43909 Migraine, unspecified, not intractable, without status migrainosus: Secondary | ICD-10-CM | POA: Insufficient documentation

## 2014-08-14 DIAGNOSIS — G4700 Insomnia, unspecified: Secondary | ICD-10-CM | POA: Insufficient documentation

## 2014-08-14 DIAGNOSIS — H66002 Acute suppurative otitis media without spontaneous rupture of ear drum, left ear: Secondary | ICD-10-CM | POA: Insufficient documentation

## 2014-08-14 DIAGNOSIS — Z79899 Other long term (current) drug therapy: Secondary | ICD-10-CM | POA: Insufficient documentation

## 2014-08-14 DIAGNOSIS — Z7982 Long term (current) use of aspirin: Secondary | ICD-10-CM | POA: Insufficient documentation

## 2014-08-14 DIAGNOSIS — Z7952 Long term (current) use of systemic steroids: Secondary | ICD-10-CM | POA: Insufficient documentation

## 2014-08-14 DIAGNOSIS — H9202 Otalgia, left ear: Secondary | ICD-10-CM

## 2014-08-14 DIAGNOSIS — Z7951 Long term (current) use of inhaled steroids: Secondary | ICD-10-CM | POA: Insufficient documentation

## 2014-08-14 MED ORDER — HYDROMORPHONE HCL 1 MG/ML IJ SOLN
0.5000 mg | Freq: Once | INTRAMUSCULAR | Status: AC
  Administered 2014-08-14: 0.5 mg via INTRAVENOUS
  Filled 2014-08-14: qty 1

## 2014-08-14 MED ORDER — IOHEXOL 300 MG/ML  SOLN
100.0000 mL | Freq: Once | INTRAMUSCULAR | Status: AC | PRN
  Administered 2014-08-14: 100 mL via INTRAVENOUS

## 2014-08-14 MED ORDER — NAPROXEN 500 MG PO TABS: 500.0000 mg | ORAL_TABLET | Freq: Two times a day (BID) | ORAL | Status: DC

## 2014-08-14 MED ORDER — ANTIPYRINE-BENZOCAINE 5.4-1.4 % OT SOLN: 3.0000 [drp] | OTIC | Status: DC | PRN

## 2014-08-14 MED ORDER — AMOXICILLIN 500 MG PO CAPS: 500.0000 mg | ORAL_CAPSULE | Freq: Three times a day (TID) | ORAL | Status: DC

## 2014-08-14 NOTE — ED Notes (Signed)
Pt reports L ear pain x 1 week, with hearing loss.  Redness noted in L ear.

## 2014-08-14 NOTE — Discharge Instructions (Signed)
Read the information below.  Use the prescribed medication as directed.  Please discuss all new medications with your pharmacist.  You may return to the Emergency Department at any time for worsening condition or any new symptoms that concern you.   If you develop high fevers that do not resolve with tylenol or ibuprofen, uncontrolled pain, significant discharge or bleeding from your ear, return to the ER for a recheck.      Otitis Media Otitis media is redness, soreness, and inflammation of the middle ear. Otitis media may be caused by allergies or, most commonly, by infection. Often it occurs as a complication of the common cold. SIGNS AND SYMPTOMS Symptoms of otitis media may include:  Earache.  Fever.  Ringing in your ear.  Headache.  Leakage of fluid from the ear. DIAGNOSIS To diagnose otitis media, your health care provider will examine your ear with an otoscope. This is an instrument that allows your health care provider to see into your ear in order to examine your eardrum. Your health care provider also will ask you questions about your symptoms. TREATMENT  Typically, otitis media resolves on its own within 3-5 days. Your health care provider may prescribe medicine to ease your symptoms of pain. If otitis media does not resolve within 5 days or is recurrent, your health care provider may prescribe antibiotic medicines if he or she suspects that a bacterial infection is the cause. HOME CARE INSTRUCTIONS   If you were prescribed an antibiotic medicine, finish it all even if you start to feel better.  Take medicines only as directed by your health care provider.  Keep all follow-up visits as directed by your health care provider. SEEK MEDICAL CARE IF:  You have otitis media only in one ear, or bleeding from your nose, or both.  You notice a lump on your neck.  You are not getting better in 3-5 days.  You feel worse instead of better. SEEK IMMEDIATE MEDICAL CARE IF:   You  have pain that is not controlled with medicine.  You have swelling, redness, or pain around your ear or stiffness in your neck.  You notice that part of your face is paralyzed.  You notice that the bone behind your ear (mastoid) is tender when you touch it. MAKE SURE YOU:   Understand these instructions.  Will watch your condition.  Will get help right away if you are not doing well or get worse. Document Released: 05/18/2004 Document Revised: 12/28/2013 Document Reviewed: 03/10/2013 [facility] Patient Information 2015 [place], [place]. This information is not intended to replace advice given to you by your health care provider. Make sure you discuss any questions you have with your health care provider.

## 2014-08-14 NOTE — ED Provider Notes (Signed)
CSN: [id number]     Arrival date & time 08/14/14  1529 History   First MD Initiated Contact with Patient 08/14/14 1601     Chief Complaint  Patient presents with  . Otalgia     (Consider location/radiation/quality/duration/timing/severity/associated sxs/prior Treatment) The history is provided by the patient.     Patient presents with left ear pain and decreased hearing, gradually worsening x several months.  Pain is sharp and constant.  Pt has had months of cough and congestion symptoms, followed by pulmonology, has taken 3 courses of antibiotics and steroids, has started new medication for allergy.  Denies any worsening of these symptoms.  Denies recent fevers.    Past Medical History  Diagnosis Date  . Hypertension   . Anxiety and depression   . Osteoarthrosis, unspecified whether generalized or localized, unspecified site   . Irritable bowel syndrome   . GERD (gastroesophageal reflux disease)     h/o esophagitis  . Allergic rhinitis   . Insomnia   . Diverticulosis   . Alcohol abuse 2012  . Polysubstance abuse   . Seizures   . Alcoholic pancreatitis   . Periumbilical hernia 12/13/12  . Fatty liver 10/08/11  . Internal hemorrhoids 03/13/11  . Hiatal hernia 03/13/11  . Asthmatic bronchitis   . Frequent headaches   . 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 Prediabetes 03/01/2014   Past Surgical History  Procedure Laterality Date  . Tibula surgery Right 2002   Family History  Problem Relation Age of Onset  . Heart disease Mother   . Gallbladder disease Mother   . Nephrolithiasis Mother   . Arthritis Mother   . Hyperlipidemia Mother   . Stroke Mother   . Irritable bowel syndrome Mother   . Cancer Mother     vulva  . Colon cancer Neg Hx   . Hypertension Father     moth  . Heart disease Father   . Arthritis Father   . Irritable bowel syndrome Brother     x 2   History  Substance Use Topics  . Smoking status: Former Smoker -- 1.50 packs/day for 18 years    Types:  Cigarettes    Quit date: 08/28/1991  . Smokeless tobacco: Current User    Types: Chew     Comment: currently uses snuff.11/17/13  . Alcohol Use: No     Comment: no alcohol in one yr; drank 35 years alcohol, quit 02/2012    Review of Systems  Constitutional: Negative for fever and chills.  HENT: Positive for congestion, ear pain, hearing loss and postnasal drip. Negative for trouble swallowing.   Respiratory: Positive for cough. Negative for shortness of breath.   Musculoskeletal: Negative for neck pain.  Skin: Negative for color change.  All other systems reviewed and are negative.     Allergies  Codeine; Benazepril; Ativan; and Corticosteroids  Home Medications   Prior to Admission medications   Medication Sig Start Date End Date Taking? Authorizing Provider  albuterol (PROVENTIL HFA;VENTOLIN HFA) 108 (90 BASE) MCG/ACT inhaler Inhale 2 puffs into the lungs every 6 (six) hours as needed for wheezing or shortness of breath. 04/02/14   Wanda Plump, MD  amoxicillin (AMOXIL) 500 MG capsule Take 1 capsule (500 mg total) by mouth 3 (three) times daily. 08/14/14   Trixie Dredge, PA-C  aspirin 81 MG chewable tablet Chew 81 mg by mouth daily.    Historical Provider, MD  atenolol (TENORMIN) 100 MG tablet Take 1 tablet (100 mg total) by mouth daily.  04/23/14   Wanda PlumpJose E Paz, MD  budesonide-formoterol [facility](SYMBICORT) 160-4.5 MCG/ACT inhaler Inhale 2 puffs into the lungs 2 (two) times daily. 04/02/14   Wanda PlumpJose E Paz, MD  carbamazepine (TEGRETOL XR) 200 MG 12 hr tablet Take 2 tablets (400 mg total) by mouth 2 (two) times daily. 07/16/14   Adam Gus Rankinobert Jaffe, DO  cetirizine (ZYRTEC) 10 MG tablet Take 1 tablet (10 mg total) by mouth daily. 06/29/14   Leslye Peerobert S Byrum, MD  cloNIDine (CATAPRES) 0.1 MG tablet Take 1 tablet (0.1 mg total) by mouth 2 (two) times daily. 07/28/14   Wanda PlumpJose E Paz, MD  cyclobenzaprine (FLEXERIL) 5 MG tablet Take 1-2tabs TID prn 07/16/14   Cira ServantAdam Robert Jaffe, DO  diphenhydrAMINE (BENADRYL) 25 MG tablet  Take 1 tablet (25 mg total) by mouth every 6 (six) hours as needed for allergies. 06/23/14   Leslye Peerobert S Byrum, MD  esomeprazole (NEXIUM) 40 MG capsule Take 1 capsule (40 mg total) by mouth daily at 12 noon. 07/15/14   Leslye Peerobert S Byrum, MD  fesoterodine (TOVIAZ) 4 MG TB24 tablet Take 4 mg by mouth daily.    Historical Provider, MD  FLUoxetine (PROZAC) 20 MG capsule Take 3 capsules (60 mg total) by mouth at bedtime. 04/02/14   Wanda PlumpJose E Paz, MD  gabapentin (NEURONTIN) 300 MG capsule Take 1 capsule (300 mg total) by mouth 2 (two) times daily. 07/14/14   Wanda PlumpJose E Paz, MD  hydrochlorothiazide (HYDRODIURIL) 25 MG tablet Take 1 tablet (25 mg total) by mouth daily. 07/27/14   Wanda PlumpJose E Paz, MD  hydrOXYzine (VISTARIL) 25 MG capsule Take 1 capsule (25 mg total) by mouth 2 (two) times daily. 04/02/14   Wanda PlumpJose E Paz, MD  ibuprofen (ADVIL,MOTRIN) 200 MG tablet Take 400 mg by mouth every 6 (six) hours as needed for moderate pain.    Historical Provider, MD  ipratropium (ATROVENT) 0.03 % nasal spray Two sprays each nostril 2-3 times daily prn. 06/23/14   Leslye Peerobert S Byrum, MD  methocarbamol (ROBAXIN) 500 MG tablet Take 1 tablet (500 mg total) by mouth 2 (two) times daily. 03/18/14   Loren Raceravid Yelverton, MD  mometasone (NASONEX) 50 MCG/ACT nasal spray Place 2 sprays into the nose daily. 06/09/14   Leslye Peerobert S Byrum, MD  Multiple Vitamin (MULTIVITAMIN) tablet Take 1 tablet by mouth daily.    Historical Provider, MD  nystatin cream (MYCOSTATIN) Apply 1 application topically 2 (two) times daily.    Historical Provider, MD  omalizumab Geoffry Paradise(XOLAIR) 150 MG injection Inject 300 mg into the skin every 28 (twenty-eight) days.    Historical Provider, MD  predniSONE (DELTASONE) 20 MG tablet Take 1 tablet (20 mg total) by mouth daily with breakfast. 07/15/14   Leslye Peerobert S Byrum, MD  senna-docusate (SENNA-S) 8.6-50 MG per tablet Take 1 tablet by mouth daily.    Historical Provider, MD  topiramate (TOPAMAX) 25 MG tablet Take 1 tablet (25 mg total) by mouth daily.  05/19/14   Adam Gus Rankinobert Jaffe, DO  triamcinolone cream (KENALOG) 0.1 % Apply 1 application topically 2 (two) times daily.    Historical Provider, MD   BP 132/75 mmHg  Pulse 74  Temp(Src) 98.9 F (37.2 C) (Oral)  Resp 16  SpO2 99% Physical Exam  Constitutional: He appears well-developed and well-nourished. No distress.  HENT:  Head: Normocephalic and atraumatic.  Left Ear: There is mastoid tenderness. Tympanic membrane is bulging.  Mouth/Throat: Uvula is midline and oropharynx is clear and moist. Mucous membranes are not dry. No oropharyngeal exudate, posterior oropharyngeal edema, posterior  oropharyngeal erythema or tonsillar abscesses.  Left TM opaque and bulging.  Canal mildly erythematous without edema or exudate.  Right TM with effusion.  Canal is normal.   Eyes: Conjunctivae are normal.  Neck: Neck supple.  Cardiovascular: Normal rate and regular rhythm.   Pulmonary/Chest: Effort normal and breath sounds normal. No respiratory distress. He has no wheezes. He has no rales.  Neurological: He is alert.  Skin: He is not diaphoretic.  Nursing note and vitals reviewed.   ED Course  Procedures (including critical care time) Labs Review Labs Reviewed - No data to display  Imaging Review Ct Temporal Bones W/cm  08/14/2014   CLINICAL DATA:  48 year old male with left ear pain and redness. Hearing loss. Symptoms for 3 months. Initial encounter.  EXAM: CT TEMPORAL BONES WITH CONTRAST  TECHNIQUE: Axial and coronal plane CT imaging of the petrous temporal bones was performed with thin-collimation image reconstruction after intravenous contrast administration. Multiplanar CT image reconstructions were also generated.  CONTRAST:  100mL OMNIPAQUE IOHEXOL 300 MG/ML  SOLN  COMPARISON:  Paranasal sinus CT 07/09/2014, and earlier.  FINDINGS: Visualized brain parenchyma appear stable. Major vascular structures are patent, including the left sigmoid sinus and IJ bulb.  Bilateral periauricular soft  tissues are within normal limits. Visualized deep soft tissue spaces of the face are within normal limits. Visualized orbit soft tissues are within normal limits.  Right greater than left maxillary sinus fluid levels. Mild to moderate paranasal sinus mucosal thickening elsewhere.  RIGHT TEMPORAL BONE:  Normal EAC, tympanic membrane, tympanic cavity, ossicles. The right mastoids are clear.  Right IAC, cochlea, vestibule, semicircular canals, and vestibular aqueduct are within normal limits. Negative course of the right seventh nerve.  LEFT TEMPORAL BONE:  Soft tissue thickening of the EAC (series 10, image 79). Complete opacification of the left tympanic cavity, obscuring the left tympanic membrane. The scutum and left ossicles appear intact. Subtotal opacification of the left mastoid air cells, with numerous mastoid air-fluid levels. No mastoid coalescence. Sigmoid plate intact.  Left IAC, cochlea, vestibule, semicircular canals, and vestibular aqueduct are within normal limits.  Pneumatized left petrous apex.  Normal left stylomastoid foramen.  Negative nasopharynx.  IMPRESSION: 1. Diffusely opacified left middle ear and mastoids. Soft tissue thickening of the left EAC. No associated osseous erosion, mastoid coalescence, or complicating features identified. Favor infectious otitis. 2. Normal right temporal bone. 3. Paranasal sinusitis, most involving the maxillary sinuses.   Electronically Signed   By: Augusto GambleLee  Hall M.D.   On: 08/14/2014 19:18     EKG Interpretation None       7:39 PM Reviewed CT results with Dr Fredderick PhenixBelfi.  Will treat for otitis media.   MDM   Final diagnoses:  Acute suppurative otitis media of left ear without spontaneous rupture of tympanic membrane, recurrence not specified  Mastoid pain, left    Afebrile, nontoxic patient with left otitis media with mastoid tenderness.  CT negative for mastoiditis.   D/C home with amoxicillin, auralgan, naproxen, tramadol.  PCP follow up.  Discussed  result, findings, treatment, and follow up  with patient.  Pt given return precautions.  Pt verbalizes understanding and agrees with plan.         Trixie Dredgemily Tylah Mancillas, PA-C 08/14/14 2003  Donnetta HutchingBrian Cook, MD 08/15/14 [id number]

## 2014-08-15 NOTE — Telephone Encounter (Signed)
Ok with me 

## 2014-08-17 ENCOUNTER — Ambulatory Visit (INDEPENDENT_AMBULATORY_CARE_PROVIDER_SITE_OTHER): Payer: Self-pay | Admitting: Family

## 2014-08-17 ENCOUNTER — Encounter: Payer: Self-pay | Admitting: Family

## 2014-08-17 ENCOUNTER — Telehealth: Payer: Self-pay | Admitting: Emergency Medicine

## 2014-08-17 VITALS — BP 148/94 | HR 71 | Temp 98.1°F | Resp 18 | Ht 60.0 in | Wt 178.8 lb

## 2014-08-17 DIAGNOSIS — H65192 Other acute nonsuppurative otitis media, left ear: Secondary | ICD-10-CM

## 2014-08-17 DIAGNOSIS — H6590 Unspecified nonsuppurative otitis media, unspecified ear: Secondary | ICD-10-CM | POA: Insufficient documentation

## 2014-08-17 MED ORDER — NAPROXEN 500 MG PO TABS: 500.0000 mg | ORAL_TABLET | Freq: Two times a day (BID) | ORAL | Status: DC

## 2014-08-17 MED ORDER — AMOXICILLIN 500 MG PO CAPS: 500.0000 mg | ORAL_CAPSULE | Freq: Three times a day (TID) | ORAL | Status: DC

## 2014-08-17 MED ORDER — HYDROXYZINE PAMOATE 25 MG PO CAPS: 25.0000 mg | ORAL_CAPSULE | Freq: Two times a day (BID) | ORAL | Status: DC

## 2014-08-17 MED ORDER — HYDROCHLOROTHIAZIDE 25 MG PO TABS: 25.0000 mg | ORAL_TABLET | Freq: Every day | ORAL | Status: DC

## 2014-08-17 MED ORDER — FLUOXETINE HCL 20 MG PO CAPS: 60.0000 mg | ORAL_CAPSULE | Freq: Every day | ORAL | Status: DC

## 2014-08-17 MED ORDER — GABAPENTIN 300 MG PO CAPS: 300.0000 mg | ORAL_CAPSULE | Freq: Two times a day (BID) | ORAL | Status: DC

## 2014-08-17 NOTE — Assessment & Plan Note (Signed)
Symptoms and exam consistent with otitis media. Patient previously had prescription for amoxicillin for which she has yet to start. Start amoxicillin. For pain relief may consider anti-inflammatories. Follow up if symptoms worsen or fail to improve.

## 2014-08-17 NOTE — Patient Instructions (Signed)
Thank you for choosing [facility].  Summary/Instructions:  Your prescription(s) have been submitted to your pharmacy. Please take as directed and contact our office if you believe you are having problem(s) with the medication(s).  If your symptoms worsen or fail to improve, please contact our office for further instruction, or in case of emergency go directly to the emergency room at the closest medical facility.    Otitis Media Otitis media is redness, soreness, and inflammation of the middle ear. Otitis media may be caused by allergies or, most commonly, by infection. Often it occurs as a complication of the common cold. SIGNS AND SYMPTOMS Symptoms of otitis media may include:  Earache.  Fever.  Ringing in your ear.  Headache.  Leakage of fluid from the ear. DIAGNOSIS To diagnose otitis media, your health care provider will examine your ear with an otoscope. This is an instrument that allows your health care provider to see into your ear in order to examine your eardrum. Your health care provider also will ask you questions about your symptoms. TREATMENT  Typically, otitis media resolves on its own within 3-5 days. Your health care provider may prescribe medicine to ease your symptoms of pain. If otitis media does not resolve within 5 days or is recurrent, your health care provider may prescribe antibiotic medicines if he or she suspects that a bacterial infection is the cause. HOME CARE INSTRUCTIONS   If you were prescribed an antibiotic medicine, finish it all even if you start to feel better.  Take medicines only as directed by your health care provider.  Keep all follow-up visits as directed by your health care provider. SEEK MEDICAL CARE IF:  You have otitis media only in one ear, or bleeding from your nose, or both.  You notice a lump on your neck.  You are not getting better in 3-5 days.  You feel worse instead of better. SEEK IMMEDIATE MEDICAL CARE IF:    You have pain that is not controlled with medicine.  You have swelling, redness, or pain around your ear or stiffness in your neck.  You notice that part of your face is paralyzed.  You notice that the bone behind your ear (mastoid) is tender when you touch it. MAKE SURE YOU:   Understand these instructions.  Will watch your condition.  Will get help right away if you are not doing well or get worse. Document Released: 05/18/2004 Document Revised: 12/28/2013 Document Reviewed: 03/10/2013 [facility] Patient Information 2015 [place], [place]. This information is not intended to replace advice given to you by your health care provider. Make sure you discuss any questions you have with your health care provider.

## 2014-08-17 NOTE — Progress Notes (Signed)
Pre visit review using our clinic review tool, if applicable. No additional management support is needed unless otherwise documented below in the visit note. 

## 2014-08-17 NOTE — Telephone Encounter (Signed)
Pt dropped off forms to be filled out for [employer]. States that any provider can fill this out. Spoke with Dr Kriste BasqueNadel (DOD) and he advised pt to have PCP fill this out. Spoke with patient-aware to take to PCP. Nothing further needed.

## 2014-08-17 NOTE — Progress Notes (Signed)
Subjective:    Patient ID: Martin Singh, male    DOB: Sep 13, 1965, 48 y.o.   MRN: [id number]  Chief Complaint  Patient presents with  . Ear Pain    Left ear pain, said he was told he had fluid behind both ears but was given nothing    HPI:  Martin Singh is a 48 y.o. male who presents today to establish care and discuss his ear pain.  Ear pain has been going on for a couple of weeks. States that he cannot hear at all out of his left ear and states that he has an echo in the right ear. Indicates that he has had some vertigo and the pain is described as aching. Has tried taking ibuprofen which does not provide a lot of relief. Denies any trauma to his ears. States that he has not so good hearing to begin with.   Allergies  Allergen Reactions  . Codeine Anaphylaxis  . Benazepril Cough  . Ativan [Lorazepam] Other (See Comments)    Becomes violent and hallucinates  . Corticosteroids Other (See Comments)    Shaky and high blood pressure   Current Outpatient Prescriptions on File Prior to Visit  Medication Sig Dispense Refill  . albuterol (PROVENTIL HFA;VENTOLIN HFA) 108 (90 BASE) MCG/ACT inhaler Inhale 2 puffs into the lungs every 6 (six) hours as needed for wheezing or shortness of breath. 1 Inhaler 2  . amoxicillin (AMOXIL) 500 MG capsule Take 1 capsule (500 mg total) by mouth 3 (three) times daily. 30 capsule 0  . antipyrine-benzocaine (AURALGAN) otic solution Place 3 drops into the right ear every 2 (two) hours as needed for ear pain. 10 mL 0  . aspirin 81 MG chewable tablet Chew 81 mg by mouth daily.    Marland Kitchen. atenolol (TENORMIN) 100 MG tablet Take 1 tablet (100 mg total) by mouth daily. 30 tablet 2  . budesonide-formoterol (SYMBICORT) 160-4.5 MCG/ACT inhaler Inhale 2 puffs into the lungs 2 (two) times daily. 1 Inhaler 2  . carbamazepine (TEGRETOL XR) 200 MG 12 hr tablet Take 2 tablets (400 mg total) by mouth 2 (two) times daily. 360 tablet 1  . cetirizine (ZYRTEC) 10 MG tablet Take 1  tablet (10 mg total) by mouth daily. 30 tablet 6  . cloNIDine (CATAPRES) 0.1 MG tablet Take 1 tablet (0.1 mg total) by mouth 2 (two) times daily. 60 tablet 0  . cyclobenzaprine (FLEXERIL) 5 MG tablet Take 1-2tabs TID prn 90 tablet 1  . diphenhydrAMINE (BENADRYL) 25 MG tablet Take 1 tablet (25 mg total) by mouth every 6 (six) hours as needed for allergies. 30 tablet 5  . esomeprazole (NEXIUM) 40 MG capsule Take 1 capsule (40 mg total) by mouth daily at 12 noon. 60 capsule 2  . fesoterodine (TOVIAZ) 4 MG TB24 tablet Take 4 mg by mouth daily.    Marland Kitchen. FLUoxetine (PROZAC) 20 MG capsule Take 3 capsules (60 mg total) by mouth at bedtime. 180 capsule 2  . gabapentin (NEURONTIN) 300 MG capsule Take 1 capsule (300 mg total) by mouth 2 (two) times daily. 60 capsule 3  . hydrochlorothiazide (HYDRODIURIL) 25 MG tablet Take 1 tablet (25 mg total) by mouth daily. 30 tablet 2  . hydrOXYzine (VISTARIL) 25 MG capsule Take 1 capsule (25 mg total) by mouth 2 (two) times daily. 60 capsule 3  . ibuprofen (ADVIL,MOTRIN) 200 MG tablet Take 400 mg by mouth every 6 (six) hours as needed for moderate pain.    Marland Kitchen. ipratropium (ATROVENT)  0.03 % nasal spray Two sprays each nostril 2-3 times daily prn. 30 mL 5  . methocarbamol (ROBAXIN) 500 MG tablet Take 1 tablet (500 mg total) by mouth 2 (two) times daily. 20 tablet 0  . mometasone (NASONEX) 50 MCG/ACT nasal spray Place 2 sprays into the nose daily. 17 g 11  . Multiple Vitamin (MULTIVITAMIN) tablet Take 1 tablet by mouth daily.    . naproxen (NAPROSYN) 500 MG tablet Take 1 tablet (500 mg total) by mouth 2 (two) times daily with a meal. For pain 14 tablet 0  . nystatin cream (MYCOSTATIN) Apply 1 application topically 2 (two) times daily.    Marland Kitchen. omalizumab (XOLAIR) 150 MG injection Inject 300 mg into the skin every 28 (twenty-eight) days.    . predniSONE (DELTASONE) 20 MG tablet Take 1 tablet (20 mg total) by mouth daily with breakfast. 30 tablet 1  . senna-docusate (SENNA-S) 8.6-50  MG per tablet Take 1 tablet by mouth daily.    Marland Kitchen. topiramate (TOPAMAX) 25 MG tablet Take 1 tablet (25 mg total) by mouth daily. 120 tablet 3  . triamcinolone cream (KENALOG) 0.1 % Apply 1 application topically 2 (two) times daily.     No current facility-administered medications on file prior to visit.    Review of Systems    See HPI  Objective:    BP 148/94 mmHg  Pulse 71  Temp(Src) 98.1 F (36.7 C) (Oral)  Resp 18  Ht 5' (1.524 m)  Wt 178 lb 12.8 oz (81.103 kg)  BMI 34.92 kg/m2  SpO2 97% Nursing note and vital signs reviewed.  Physical Exam  Constitutional: He is oriented to person, place, and time. He appears well-developed and well-nourished. No distress.  HENT:  Right Ear: Tympanic membrane, external ear and ear canal normal.  Left Ear: External ear and ear canal normal. Tympanic membrane is erythematous. A middle ear effusion is present. Decreased hearing is noted.  Nose: Nose normal. Right sinus exhibits no maxillary sinus tenderness and no frontal sinus tenderness. Left sinus exhibits no maxillary sinus tenderness and no frontal sinus tenderness.  Mouth/Throat: Uvula is midline, oropharynx is clear and moist and mucous membranes are normal.  Cardiovascular: Normal rate, regular rhythm, normal heart sounds and intact distal pulses.   Pulmonary/Chest: Effort normal and breath sounds normal.  Neurological: He is alert and oriented to person, place, and time.  Skin: Skin is warm and dry.  Psychiatric: He has a normal mood and affect. His behavior is normal. Judgment and thought content normal.       Assessment & Plan:

## 2014-08-22 ENCOUNTER — Other Ambulatory Visit: Payer: Self-pay

## 2014-08-22 ENCOUNTER — Encounter (HOSPITAL_COMMUNITY): Payer: Self-pay | Admitting: Emergency Medicine

## 2014-08-22 DIAGNOSIS — Z888 Allergy status to other drugs, medicaments and biological substances status: Secondary | ICD-10-CM

## 2014-08-22 DIAGNOSIS — Z7982 Long term (current) use of aspirin: Secondary | ICD-10-CM

## 2014-08-22 DIAGNOSIS — J45909 Unspecified asthma, uncomplicated: Secondary | ICD-10-CM | POA: Diagnosis present

## 2014-08-22 DIAGNOSIS — G4700 Insomnia, unspecified: Secondary | ICD-10-CM | POA: Diagnosis present

## 2014-08-22 DIAGNOSIS — M1990 Unspecified osteoarthritis, unspecified site: Secondary | ICD-10-CM | POA: Diagnosis present

## 2014-08-22 DIAGNOSIS — Z7952 Long term (current) use of systemic steroids: Secondary | ICD-10-CM

## 2014-08-22 DIAGNOSIS — F329 Major depressive disorder, single episode, unspecified: Secondary | ICD-10-CM | POA: Diagnosis present

## 2014-08-22 DIAGNOSIS — D510 Vitamin B12 deficiency anemia due to intrinsic factor deficiency: Secondary | ICD-10-CM | POA: Diagnosis present

## 2014-08-22 DIAGNOSIS — I10 Essential (primary) hypertension: Secondary | ICD-10-CM | POA: Diagnosis present

## 2014-08-22 DIAGNOSIS — H6692 Otitis media, unspecified, left ear: Secondary | ICD-10-CM | POA: Diagnosis present

## 2014-08-22 DIAGNOSIS — Z885 Allergy status to narcotic agent status: Secondary | ICD-10-CM

## 2014-08-22 DIAGNOSIS — G4089 Other seizures: Principal | ICD-10-CM | POA: Diagnosis present

## 2014-08-22 DIAGNOSIS — E781 Pure hyperglyceridemia: Secondary | ICD-10-CM | POA: Diagnosis present

## 2014-08-22 DIAGNOSIS — R51 Headache: Secondary | ICD-10-CM | POA: Diagnosis present

## 2014-08-22 DIAGNOSIS — K219 Gastro-esophageal reflux disease without esophagitis: Secondary | ICD-10-CM | POA: Diagnosis present

## 2014-08-22 DIAGNOSIS — K589 Irritable bowel syndrome without diarrhea: Secondary | ICD-10-CM | POA: Diagnosis present

## 2014-08-22 DIAGNOSIS — Z87891 Personal history of nicotine dependence: Secondary | ICD-10-CM

## 2014-08-22 DIAGNOSIS — F419 Anxiety disorder, unspecified: Secondary | ICD-10-CM | POA: Diagnosis present

## 2014-08-22 LAB — I-STAT CHEM 8, ED
BUN: 12 mg/dL (ref 6–23)
CHLORIDE: 97 meq/L (ref 96–112)
Calcium, Ion: 1.21 mmol/L (ref 1.12–1.23)
Creatinine, Ser: 0.9 mg/dL (ref 0.50–1.35)
GLUCOSE: 115 mg/dL — AB (ref 70–99)
HEMATOCRIT: 39 % (ref 39.0–52.0)
HEMOGLOBIN: 13.3 g/dL (ref 13.0–17.0)
POTASSIUM: 3.9 mmol/L (ref 3.5–5.1)
SODIUM: 136 mmol/L (ref 135–145)
TCO2: 23 mmol/L (ref 0–100)

## 2014-08-22 LAB — CBC WITH DIFFERENTIAL/PLATELET
BASOS ABS: 0 10*3/uL (ref 0.0–0.1)
Basophils Relative: 0 % (ref 0–1)
EOS ABS: 0 10*3/uL (ref 0.0–0.7)
Eosinophils Relative: 0 % (ref 0–5)
HCT: 35.4 % — ABNORMAL LOW (ref 39.0–52.0)
HEMOGLOBIN: 11.7 g/dL — AB (ref 13.0–17.0)
Lymphocytes Relative: 22 % (ref 12–46)
Lymphs Abs: 1.7 10*3/uL (ref 0.7–4.0)
MCH: 28 pg (ref 26.0–34.0)
MCHC: 33.1 g/dL (ref 30.0–36.0)
MCV: 84.7 fL (ref 78.0–100.0)
Monocytes Absolute: 0.7 10*3/uL (ref 0.1–1.0)
Monocytes Relative: 10 % (ref 3–12)
NEUTROS ABS: 5.3 10*3/uL (ref 1.7–7.7)
NEUTROS PCT: 68 % (ref 43–77)
Platelets: 536 10*3/uL — ABNORMAL HIGH (ref 150–400)
RBC: 4.18 MIL/uL — ABNORMAL LOW (ref 4.22–5.81)
RDW: 12.6 % (ref 11.5–15.5)
WBC: 7.7 10*3/uL (ref 4.0–10.5)

## 2014-08-22 NOTE — ED Notes (Addendum)
The patient called EMS due to a headache and because he has had LOC six time since yesterday. The patient is able to walk on his own without any problems.  He did tell EMS that he is being treated for an ear ache but was unable to get his ear drops because they were too expensive.   The patient is not on coumadin.  He denies any other symptoms.

## 2014-08-22 NOTE — ED Notes (Signed)
BS 122

## 2014-08-23 ENCOUNTER — Telehealth: Payer: Self-pay | Admitting: Neurology

## 2014-08-23 ENCOUNTER — Encounter (HOSPITAL_COMMUNITY): Payer: Self-pay | Admitting: Internal Medicine

## 2014-08-23 ENCOUNTER — Telehealth: Payer: Self-pay | Admitting: *Deleted

## 2014-08-23 ENCOUNTER — Emergency Department (HOSPITAL_COMMUNITY): Payer: Self-pay

## 2014-08-23 ENCOUNTER — Telehealth: Payer: Self-pay | Admitting: Emergency Medicine

## 2014-08-23 ENCOUNTER — Inpatient Hospital Stay (HOSPITAL_COMMUNITY)
Admission: EM | Admit: 2014-08-23 | Discharge: 2014-08-24 | DRG: 101 | Disposition: A | Discharge status: Home / Self Care | Payer: Self-pay | Attending: Family Medicine | Admitting: Family Medicine

## 2014-08-23 DIAGNOSIS — R51 Headache: Secondary | ICD-10-CM

## 2014-08-23 DIAGNOSIS — R519 Headache, unspecified: Secondary | ICD-10-CM | POA: Diagnosis present

## 2014-08-23 DIAGNOSIS — R55 Syncope and collapse: Secondary | ICD-10-CM

## 2014-08-23 DIAGNOSIS — I10 Essential (primary) hypertension: Secondary | ICD-10-CM | POA: Diagnosis present

## 2014-08-23 DIAGNOSIS — D510 Vitamin B12 deficiency anemia due to intrinsic factor deficiency: Secondary | ICD-10-CM | POA: Diagnosis present

## 2014-08-23 DIAGNOSIS — H6590 Unspecified nonsuppurative otitis media, unspecified ear: Secondary | ICD-10-CM | POA: Diagnosis present

## 2014-08-23 DIAGNOSIS — I517 Cardiomegaly: Secondary | ICD-10-CM

## 2014-08-23 LAB — BASIC METABOLIC PANEL
Anion gap: 10 (ref 5–15)
BUN: 10 mg/dL (ref 6–23)
CHLORIDE: 97 meq/L (ref 96–112)
CO2: 27 mmol/L (ref 19–32)
Calcium: 9.5 mg/dL (ref 8.4–10.5)
Creatinine, Ser: 0.84 mg/dL (ref 0.50–1.35)
GFR calc non Af Amer: >90 mL/min (ref >90)
Glucose, Bld: 116 mg/dL — ABNORMAL HIGH (ref 70–99)
POTASSIUM: 3.9 mmol/L (ref 3.5–5.1)
Sodium: 134 mmol/L — ABNORMAL LOW (ref 135–145)

## 2014-08-23 LAB — COMPREHENSIVE METABOLIC PANEL
ALT: 51 U/L (ref 0–53)
ANION GAP: 10 (ref 5–15)
AST: 26 U/L (ref 0–37)
Albumin: 3.3 g/dL — ABNORMAL LOW (ref 3.5–5.2)
Alkaline Phosphatase: 86 U/L (ref 39–117)
BUN: 15 mg/dL (ref 6–23)
CO2: 26 mmol/L (ref 19–32)
CREATININE: 0.92 mg/dL (ref 0.50–1.35)
Calcium: 9.2 mg/dL (ref 8.4–10.5)
Chloride: 99 mEq/L (ref 96–112)
GFR calc Af Amer: >90 mL/min (ref >90)
Glucose, Bld: 97 mg/dL (ref 70–99)
Potassium: 3.6 mmol/L (ref 3.5–5.1)
Sodium: 135 mmol/L (ref 135–145)
TOTAL PROTEIN: 6.7 g/dL (ref 6.0–8.3)
Total Bilirubin: 0.4 mg/dL (ref 0.3–1.2)

## 2014-08-23 LAB — RAPID URINE DRUG SCREEN, HOSP PERFORMED
Amphetamines: NOT DETECTED
Barbiturates: NOT DETECTED
Benzodiazepines: NOT DETECTED
Cocaine: NOT DETECTED
Opiates: NOT DETECTED
Tetrahydrocannabinol: NOT DETECTED

## 2014-08-23 LAB — CBC WITH DIFFERENTIAL/PLATELET
Basophils Absolute: 0 10*3/uL (ref 0.0–0.1)
Basophils Relative: 0 % (ref 0–1)
Eosinophils Absolute: 0.1 10*3/uL (ref 0.0–0.7)
Eosinophils Relative: 2 % (ref 0–5)
HCT: 35.6 % — ABNORMAL LOW (ref 39.0–52.0)
Hemoglobin: 11.7 g/dL — ABNORMAL LOW (ref 13.0–17.0)
Lymphocytes Relative: 34 % (ref 12–46)
Lymphs Abs: 2.4 10*3/uL (ref 0.7–4.0)
MCH: 28.3 pg (ref 26.0–34.0)
MCHC: 32.9 g/dL (ref 30.0–36.0)
MCV: 86.2 fL (ref 78.0–100.0)
Monocytes Absolute: 0.7 10*3/uL (ref 0.1–1.0)
Monocytes Relative: 10 % (ref 3–12)
Neutro Abs: 3.7 10*3/uL (ref 1.7–7.7)
Neutrophils Relative %: 54 % (ref 43–77)
Platelets: 439 10*3/uL — ABNORMAL HIGH (ref 150–400)
RBC: 4.13 MIL/uL — ABNORMAL LOW (ref 4.22–5.81)
RDW: 12.5 % (ref 11.5–15.5)
WBC: 6.9 10*3/uL (ref 4.0–10.5)

## 2014-08-23 LAB — MRSA PCR SCREENING: MRSA by PCR: NEGATIVE

## 2014-08-23 LAB — TROPONIN I: Troponin I: <0.03 ng/mL

## 2014-08-23 LAB — CBG MONITORING, ED: GLUCOSE-CAPILLARY: 122 mg/dL — AB (ref 70–99)

## 2014-08-23 LAB — CARBAMAZEPINE LEVEL, TOTAL: Carbamazepine Lvl: 4.6 ug/mL (ref 4.0–12.0)

## 2014-08-23 MED ORDER — PREDNISONE 20 MG PO TABS
20.0000 mg | ORAL_TABLET | Freq: Every day | ORAL | Status: DC
  Administered 2014-08-23 – 2014-08-24 (×2): 20 mg via ORAL
  Filled 2014-08-23 (×3): qty 1

## 2014-08-23 MED ORDER — ONDANSETRON HCL 4 MG PO TABS: 4.0000 mg | ORAL_TABLET | Freq: Four times a day (QID) | ORAL | Status: DC | PRN

## 2014-08-23 MED ORDER — CLONIDINE HCL 0.1 MG PO TABS
0.1000 mg | ORAL_TABLET | Freq: Two times a day (BID) | ORAL | Status: DC
  Administered 2014-08-23 – 2014-08-24 (×3): 0.1 mg via ORAL
  Filled 2014-08-23 (×4): qty 1

## 2014-08-23 MED ORDER — HYDROXYZINE HCL 25 MG PO TABS
25.0000 mg | ORAL_TABLET | Freq: Two times a day (BID) | ORAL | Status: DC
  Administered 2014-08-23 – 2014-08-24 (×2): 25 mg via ORAL
  Filled 2014-08-23 (×2): qty 1

## 2014-08-23 MED ORDER — OMALIZUMAB 150 MG ~~LOC~~ SOLR: 300.0000 mg | SUBCUTANEOUS | Status: DC

## 2014-08-23 MED ORDER — NAPROXEN 500 MG PO TABS
500.0000 mg | ORAL_TABLET | Freq: Two times a day (BID) | ORAL | Status: DC
  Administered 2014-08-23 – 2014-08-24 (×3): 500 mg via ORAL
  Filled 2014-08-23 (×3): qty 1
  Filled 2014-08-23: qty 2
  Filled 2014-08-23: qty 1

## 2014-08-23 MED ORDER — DEXTROSE 5 % IV SOLN
1.0000 g | INTRAVENOUS | Status: DC
  Administered 2014-08-23: 1 g via INTRAVENOUS
  Filled 2014-08-23 (×2): qty 10

## 2014-08-23 MED ORDER — SODIUM CHLORIDE 0.9 % IJ SOLN
3.0000 mL | Freq: Two times a day (BID) | INTRAMUSCULAR | Status: DC
  Administered 2014-08-23 – 2014-08-24 (×3): 3 mL via INTRAVENOUS
  Filled 2014-08-23: qty 3

## 2014-08-23 MED ORDER — BUDESONIDE-FORMOTEROL FUMARATE 160-4.5 MCG/ACT IN AERO
2.0000 | INHALATION_SPRAY | Freq: Two times a day (BID) | RESPIRATORY_TRACT | Status: DC
Start: 2014-08-23 — End: 2014-08-24
  Administered 2014-08-23: 2 via RESPIRATORY_TRACT
  Filled 2014-08-23: qty 6

## 2014-08-23 MED ORDER — ACETAMINOPHEN 325 MG PO TABS
650.0000 mg | ORAL_TABLET | Freq: Once | ORAL | Status: AC
  Administered 2014-08-23: 650 mg via ORAL
  Filled 2014-08-23: qty 2

## 2014-08-23 MED ORDER — FLUTICASONE PROPIONATE 50 MCG/ACT NA SUSP
2.0000 | Freq: Every day | NASAL | Status: DC
Start: 2014-08-23 — End: 2014-08-24
  Administered 2014-08-23: 2 via NASAL
  Filled 2014-08-23 (×2): qty 16

## 2014-08-23 MED ORDER — METHOCARBAMOL 500 MG PO TABS
500.0000 mg | ORAL_TABLET | Freq: Two times a day (BID) | ORAL | Status: DC
  Administered 2014-08-23 – 2014-08-24 (×3): 500 mg via ORAL
  Filled 2014-08-23 (×4): qty 1

## 2014-08-23 MED ORDER — FESOTERODINE FUMARATE ER 4 MG PO TB24
4.0000 mg | ORAL_TABLET | Freq: Every day | ORAL | Status: DC
  Administered 2014-08-24: 4 mg via ORAL
  Filled 2014-08-23 (×2): qty 1

## 2014-08-23 MED ORDER — DIPHENHYDRAMINE HCL 25 MG PO CAPS: 25.0000 mg | ORAL_CAPSULE | Freq: Four times a day (QID) | ORAL | Status: DC | PRN

## 2014-08-23 MED ORDER — ATENOLOL 100 MG PO TABS
100.0000 mg | ORAL_TABLET | Freq: Every day | ORAL | Status: DC
  Administered 2014-08-23 – 2014-08-24 (×2): 100 mg via ORAL
  Filled 2014-08-23: qty 4
  Filled 2014-08-23: qty 1

## 2014-08-23 MED ORDER — SENNOSIDES-DOCUSATE SODIUM 8.6-50 MG PO TABS
1.0000 | ORAL_TABLET | Freq: Every evening | ORAL | Status: DC | PRN
  Filled 2014-08-23: qty 1

## 2014-08-23 MED ORDER — ONDANSETRON HCL 4 MG/2ML IJ SOLN: 4.0000 mg | Freq: Four times a day (QID) | INTRAMUSCULAR | Status: DC | PRN

## 2014-08-23 MED ORDER — SODIUM CHLORIDE 0.9 % IV SOLN
INTRAVENOUS | Status: AC
  Administered 2014-08-23: 20:00:00 via INTRAVENOUS

## 2014-08-23 MED ORDER — FLUOXETINE HCL 20 MG PO CAPS
60.0000 mg | ORAL_CAPSULE | Freq: Every day | ORAL | Status: DC
  Administered 2014-08-23: 60 mg via ORAL
  Filled 2014-08-23 (×3): qty 3

## 2014-08-23 MED ORDER — IPRATROPIUM BROMIDE 0.03 % NA SOLN
2.0000 | Freq: Two times a day (BID) | NASAL | Status: DC
  Filled 2014-08-23: qty 30

## 2014-08-23 MED ORDER — ACETAMINOPHEN 650 MG RE SUPP: 650.0000 mg | Freq: Four times a day (QID) | RECTAL | Status: DC | PRN

## 2014-08-23 MED ORDER — LORATADINE 10 MG PO TABS
10.0000 mg | ORAL_TABLET | Freq: Every day | ORAL | Status: DC
  Administered 2014-08-23 – 2014-08-24 (×2): 10 mg via ORAL
  Filled 2014-08-23 (×2): qty 1

## 2014-08-23 MED ORDER — ADULT MULTIVITAMIN W/MINERALS CH
1.0000 | ORAL_TABLET | Freq: Every day | ORAL | Status: DC
  Administered 2014-08-24: 1 via ORAL
  Filled 2014-08-23: qty 1

## 2014-08-23 MED ORDER — ACETAMINOPHEN 325 MG PO TABS
650.0000 mg | ORAL_TABLET | Freq: Four times a day (QID) | ORAL | Status: DC | PRN
  Administered 2014-08-23: 650 mg via ORAL
  Filled 2014-08-23: qty 2

## 2014-08-23 MED ORDER — CARBAMAZEPINE ER 400 MG PO TB12
400.0000 mg | ORAL_TABLET | Freq: Two times a day (BID) | ORAL | Status: DC
  Administered 2014-08-23 – 2014-08-24 (×3): 400 mg via ORAL
  Filled 2014-08-23 (×5): qty 1

## 2014-08-23 MED ORDER — DIPHENHYDRAMINE HCL 25 MG PO TABS
25.0000 mg | ORAL_TABLET | Freq: Four times a day (QID) | ORAL | Status: DC | PRN
  Filled 2014-08-23: qty 1

## 2014-08-23 MED ORDER — KETOROLAC TROMETHAMINE 30 MG/ML IJ SOLN
30.0000 mg | Freq: Once | INTRAMUSCULAR | Status: AC
  Administered 2014-08-23: 30 mg via INTRAVENOUS
  Filled 2014-08-23: qty 1

## 2014-08-23 MED ORDER — PANTOPRAZOLE SODIUM 40 MG PO TBEC
40.0000 mg | DELAYED_RELEASE_TABLET | Freq: Every day | ORAL | Status: DC
  Administered 2014-08-23 – 2014-08-24 (×2): 40 mg via ORAL
  Filled 2014-08-23 (×2): qty 1

## 2014-08-23 MED ORDER — ASPIRIN 81 MG PO CHEW
81.0000 mg | CHEWABLE_TABLET | Freq: Every day | ORAL | Status: DC
  Administered 2014-08-23 – 2014-08-24 (×2): 81 mg via ORAL
  Filled 2014-08-23 (×2): qty 1

## 2014-08-23 MED ORDER — TOPIRAMATE 25 MG PO TABS
25.0000 mg | ORAL_TABLET | Freq: Every day | ORAL | Status: DC
  Administered 2014-08-23 – 2014-08-24 (×2): 25 mg via ORAL
  Filled 2014-08-23 (×2): qty 1

## 2014-08-23 MED ORDER — GABAPENTIN 300 MG PO CAPS
300.0000 mg | ORAL_CAPSULE | Freq: Two times a day (BID) | ORAL | Status: DC
  Administered 2014-08-23 – 2014-08-24 (×3): 300 mg via ORAL
  Filled 2014-08-23 (×4): qty 1

## 2014-08-23 MED ORDER — HYDROXYZINE PAMOATE 25 MG PO CAPS
25.0000 mg | ORAL_CAPSULE | Freq: Two times a day (BID) | ORAL | Status: DC
  Administered 2014-08-23: 25 mg via ORAL
  Filled 2014-08-23 (×3): qty 1

## 2014-08-23 MED ORDER — ALBUTEROL SULFATE (2.5 MG/3ML) 0.083% IN NEBU: 2.5000 mg | INHALATION_SOLUTION | Freq: Four times a day (QID) | RESPIRATORY_TRACT | Status: DC | PRN

## 2014-08-23 MED ORDER — IBUPROFEN 400 MG PO TABS
400.0000 mg | ORAL_TABLET | Freq: Four times a day (QID) | ORAL | Status: DC | PRN
  Administered 2014-08-23: 400 mg via ORAL
  Filled 2014-08-23 (×3): qty 1

## 2014-08-23 MED ORDER — CYCLOBENZAPRINE HCL 10 MG PO TABS: 5.0000 mg | ORAL_TABLET | Freq: Three times a day (TID) | ORAL | Status: DC | PRN

## 2014-08-23 MED ORDER — ALBUTEROL SULFATE HFA 108 (90 BASE) MCG/ACT IN AERS: 2.0000 | INHALATION_SPRAY | Freq: Four times a day (QID) | RESPIRATORY_TRACT | Status: DC | PRN

## 2014-08-23 NOTE — Progress Notes (Signed)
UR completed 

## 2014-08-23 NOTE — Progress Notes (Signed)
Patient seen and evaluated earlier this AM by my associate. Please refer to H and P for details regarding assessment and plan.  Will reassess next am.  Martin Singh  

## 2014-08-23 NOTE — Telephone Encounter (Signed)
[name], pt's fiance called and would like to speak to a nurse regarding pt being admitted to the hospital today. Please call her # [phone]

## 2014-08-23 NOTE — Progress Notes (Signed)
ANTIBIOTIC CONSULT NOTE - INITIAL  Pharmacy Consult for ceftriaxone Indication: otitis media  Allergies  Allergen Reactions  . Codeine Anaphylaxis  . Benazepril Cough  . Ativan [Lorazepam] Other (See Comments)    Becomes violent and hallucinates  . Corticosteroids Other (See Comments)    Shaky and high blood pressure    Patient Measurements: Height: 5' (152.4 cm) Weight: 178 lb (80.74 kg) IBW/kg (Calculated) : 50   Vital Signs: Temp: 98 F (36.7 C) (12/28 0027) Temp Source: Oral (12/28 0027) BP: 161/96 mmHg (12/28 0837) Pulse Rate: 79 (12/28 0837) Intake/Output from previous day: 12/27 0701 - 12/28 0700 In: -  Out: 150 [Urine:150] Intake/Output from this shift:    Labs:  Recent Labs  08/22/14 2315 08/22/14 2331  WBC 7.7  --   HGB 11.7* 13.3  PLT 536*  --   CREATININE 0.84 0.90   Estimated Creatinine Clearance: 87.5 mL/min (by C-G formula based on Cr of 0.9). No results for input(s): VANCOTROUGH, VANCOPEAK, VANCORANDOM, GENTTROUGH, GENTPEAK, GENTRANDOM, TOBRATROUGH, TOBRAPEAK, TOBRARND, AMIKACINPEAK, AMIKACINTROU, AMIKACIN in the last 72 hours.   Microbiology: No results found for this or any previous visit (from the past 720 hour(s)).  Medical History: Past Medical History  Diagnosis Date  . Hypertension   . Anxiety and depression   . Osteoarthrosis, unspecified whether generalized or localized, unspecified site   . Irritable bowel syndrome   . GERD (gastroesophageal reflux disease)     h/o esophagitis  . Allergic rhinitis   . Insomnia   . Diverticulosis   . Alcohol abuse 2012  . Polysubstance abuse   . Seizures   . Alcoholic pancreatitis   . Periumbilical hernia 12/13/12  . Fatty liver 10/08/11  . Internal hemorrhoids 03/13/11  . Hiatal hernia 03/13/11  . Asthmatic bronchitis   . Frequent headaches   . 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 Prediabetes 03/01/2014    Medications:  See med history Assessment: 48 yo man admitted with loss of  consciousness to start rocephin for otitis media.  He was on amoxicillin PTA.  Goal of Therapy:  Eradication of infection  Plan:  Rocephin 1 gm IV q24 hours Pharmacy will sign off.  Please advise if we can be of further assistance.  Chiffon Kittleson Poteet 08/23/2014,8:49 AM

## 2014-08-23 NOTE — Telephone Encounter (Signed)
Spoke with Martin Singh, states that the patient started having nosebleeds Saturday, Martin Singh reports that the patient was blowing small blood clots and began having blackouts ( maximum 6 a day). EMS [facility]. Pt was evaluated in ED by Neuro and they decided to keep the patient for a few days.  BP 190-101 pulse 145 Will send to Dr Delton CoombesByrum as Lorain ChildesFYI.

## 2014-08-23 NOTE — Telephone Encounter (Signed)
[name] called to let office know that patent has been in hospital for the past 15 hours he has been having snycope episodes as well as out burst of angry behavor  which he has never had before .

## 2014-08-23 NOTE — ED Notes (Signed)
C/o a headache still  Admitting doctor has seen

## 2014-08-23 NOTE — H&P (Signed)
[facility] History and Physical  Martin Singh ZOX:[id number] DOB: [birth year] DOA: 08/23/2014  Referring physician: ER physician. PCP: Jeanine Luz, FNP   Chief Complaint: Loss of consciousness.  HPI: Martin Singh is a 48 y.o. male with history of asthma, hypertension chronic headache and seizures was brought to the ER after patient had an episode of loss of consciousness and lacerated his forehead. CT head did not show anything acute and EKG was unremarkable. Patient states that he was recently diagnosed with left ear otitis media and was placed on amoxicillin and the first dose was started on Saturday 2 days ago. Patient was last 48 hours and 6 episodes of loss of consciousness which had no prodrome and patient suddenly fell losing consciousness for a few seconds and regained consciousness following which patient is confused as witnessed by patient's girlfriend as per the patient. She did not have any tongue bite or incontinence of urine. Last spell was last night and patient lacerated his scalp. At this time patient has been admitted for further management of his syncope. Patient denies any chest pain shortness of breath nausea vomiting abdominal pain palpitations fever chills. He did have some headache after his fall.   Review of Systems: As presented in the history of presenting illness, rest negative.  Past Medical History  Diagnosis Date  . Hypertension   . Anxiety and depression   . Osteoarthrosis, unspecified whether generalized or localized, unspecified site   . Irritable bowel syndrome   . GERD (gastroesophageal reflux disease)     h/o esophagitis  . Allergic rhinitis   . Insomnia   . Diverticulosis   . Alcohol abuse 2012  . Polysubstance abuse   . Seizures   . Alcoholic pancreatitis   . Periumbilical hernia 12/13/12  . Fatty liver 10/08/11  . Internal hemorrhoids 03/13/11  . Hiatal hernia 03/13/11  . Asthmatic bronchitis   . Frequent headaches   . Pure  hyperglyceridemia 09/21/2011  . Prediabetes 03/01/2014   Past Surgical History  Procedure Laterality Date  . Tibula surgery Right 2002   Social History:  reports that he quit smoking about 23 years ago. His smoking use included Cigarettes. He has a 27 pack-year smoking history. His smokeless tobacco use includes Chew. He reports that he does not drink alcohol or use illicit drugs. Where does patient live home. Can patient participate in ADLs? Yes.  Allergies  Allergen Reactions  . Codeine Anaphylaxis  . Benazepril Cough  . Ativan [Lorazepam] Other (See Comments)    Becomes violent and hallucinates  . Corticosteroids Other (See Comments)    Shaky and high blood pressure    Family History:  Family History  Problem Relation Age of Onset  . Heart disease Mother   . Gallbladder disease Mother   . Nephrolithiasis Mother   . Arthritis Mother   . Hyperlipidemia Mother   . Stroke Mother   . Irritable bowel syndrome Mother   . Cancer Mother     vulva  . Colon cancer Neg Hx   . Hypertension Father     moth  . Heart disease Father   . Arthritis Father   . Irritable bowel syndrome Brother     x 2      Prior to Admission medications   Medication Sig Start Date End Date Taking? Authorizing Provider  albuterol (PROVENTIL HFA;VENTOLIN HFA) 108 (90 BASE) MCG/ACT inhaler Inhale 2 puffs into the lungs every 6 (six) hours as needed for wheezing or shortness of breath. 04/02/14  Yes Wanda Plump, MD  amoxicillin (AMOXIL) 500 MG capsule Take 1 capsule (500 mg total) by mouth 3 (three) times daily. 08/17/14  Yes Jeanine Luz, FNP  aspirin 81 MG chewable tablet Chew 81 mg by mouth daily.   Yes Historical Provider, MD  atenolol (TENORMIN) 100 MG tablet Take 1 tablet (100 mg total) by mouth daily. 04/23/14  Yes Wanda Plump, MD  budesonide-formoterol [facility]) 160-4.5 MCG/ACT inhaler Inhale 2 puffs into the lungs 2 (two) times daily. 04/02/14  Yes Wanda Plump, MD  carbamazepine (TEGRETOL XR) 200 MG 12  hr tablet Take 2 tablets (400 mg total) by mouth 2 (two) times daily. 07/16/14  Yes Adam Gus Rankin, DO  cetirizine (ZYRTEC) 10 MG tablet Take 1 tablet (10 mg total) by mouth daily. Patient taking differently: Take 10 mg by mouth daily as needed for allergies.  06/29/14  Yes Leslye Peer, MD  cloNIDine (CATAPRES) 0.1 MG tablet Take 0.1 mg by mouth 2 (two) times daily.   Yes Historical Provider, MD  cyclobenzaprine (FLEXERIL) 5 MG tablet Take 1-2tabs TID prn 07/16/14  Yes Adam Gus Rankin, DO  diphenhydrAMINE (BENADRYL) 25 MG tablet Take 1 tablet (25 mg total) by mouth every 6 (six) hours as needed for allergies. 06/23/14  Yes Leslye Peer, MD  esomeprazole (NEXIUM) 40 MG capsule Take 1 capsule (40 mg total) by mouth daily at 12 noon. 07/15/14  Yes Leslye Peer, MD  fesoterodine (TOVIAZ) 4 MG TB24 tablet Take 4 mg by mouth daily.   Yes Historical Provider, MD  FLUoxetine (PROZAC) 20 MG capsule Take 3 capsules (60 mg total) by mouth at bedtime. 08/17/14  Yes Jeanine Luz, FNP  gabapentin (NEURONTIN) 300 MG capsule Take 1 capsule (300 mg total) by mouth 2 (two) times daily. 08/17/14  Yes Jeanine Luz, FNP  hydrochlorothiazide (HYDRODIURIL) 25 MG tablet Take 1 tablet (25 mg total) by mouth daily. 08/17/14  Yes Jeanine Luz, FNP  hydrOXYzine (VISTARIL) 25 MG capsule Take 1 capsule (25 mg total) by mouth 2 (two) times daily. 08/17/14  Yes Jeanine Luz, FNP  ibuprofen (ADVIL,MOTRIN) 200 MG tablet Take 400 mg by mouth every 6 (six) hours as needed for moderate pain.   Yes Historical Provider, MD  ipratropium (ATROVENT) 0.03 % nasal spray Two sprays each nostril 2-3 times daily prn. 06/23/14  Yes Leslye Peer, MD  methocarbamol (ROBAXIN) 500 MG tablet Take 1 tablet (500 mg total) by mouth 2 (two) times daily. 03/18/14  Yes Loren Racer, MD  mometasone (NASONEX) 50 MCG/ACT nasal spray Place 2 sprays into the nose daily. 06/09/14  Yes Leslye Peer, MD  Multiple Vitamin (MULTIVITAMIN)  tablet Take 1 tablet by mouth daily.   Yes Historical Provider, MD  naproxen (NAPROSYN) 500 MG tablet Take 1 tablet (500 mg total) by mouth 2 (two) times daily with a meal. For pain 08/17/14  Yes Jeanine Luz, FNP  omalizumab Geoffry Paradise) 150 MG injection Inject 300 mg into the skin every 28 (twenty-eight) days.   Yes Historical Provider, MD  predniSONE (DELTASONE) 20 MG tablet Take 1 tablet (20 mg total) by mouth daily with breakfast. 07/15/14  Yes Leslye Peer, MD  senna-docusate (SENNA-S) 8.6-50 MG per tablet Take 1 tablet by mouth at bedtime as needed for mild constipation.    Yes Historical Provider, MD  topiramate (TOPAMAX) 25 MG tablet Take 1 tablet (25 mg total) by mouth daily. 05/19/14  Yes Adam Gus Rankin, DO  antipyrine-benzocaine Lyla Son) otic solution Place 3  drops into the right ear every 2 (two) hours as needed for ear pain. Patient not taking: Reported on 08/23/2014 08/14/14   Trixie Dredge, PA-C  nystatin cream (MYCOSTATIN) Apply 1 application topically 2 (two) times daily.    Historical Provider, MD  triamcinolone cream (KENALOG) 0.1 % Apply 1 application topically 2 (two) times daily.    Historical Provider, MD    Physical Exam: Filed Vitals:   08/23/14 0238 08/23/14 0430 08/23/14 0440 08/23/14 0500  BP: 155/92 147/94 147/94 142/85  Pulse: 79 72 72 71  Temp:      TempSrc:      Resp: 20 18 14 14   Height:      Weight:      SpO2: 100% 100% 99% 98%     General:  Moderately built and nourished.  Eyes: Anicteric no pallor.  ENT: No discharge from the ears eyes nose mouth. Has several scalp laceration which has been sutured and dressed on the forehead.  Neck: No neck rigidity.  Cardiovascular: S1-S2 heard.  Respiratory: No rhonchi or crepitations.  Abdomen: Soft nontender bowel sounds present.  Skin: Scalp laceration on the forehead.  Musculoskeletal: No edema.  Psychiatric: Appears normal.  Neurologic: Alert awake oriented to time place and person. Moves all  extremities 5 x 5. No facial asymmetry. Tongue is midline. PERRLA positive.  Labs on Admission:  Basic Metabolic Panel:  Recent Labs Lab 08/22/14 2315 08/22/14 2331  NA 134* 136  K 3.9 3.9  CL 97 97  CO2 27  --   GLUCOSE 116* 115*  BUN 10 12  CREATININE 0.84 0.90  CALCIUM 9.5  --    Liver Function Tests: No results for input(s): AST, ALT, ALKPHOS, BILITOT, PROT, ALBUMIN in the last 168 hours. No results for input(s): LIPASE, AMYLASE in the last 168 hours. No results for input(s): AMMONIA in the last 168 hours. CBC:  Recent Labs Lab 08/22/14 2315 08/22/14 2331  WBC 7.7  --   NEUTROABS 5.3  --   HGB 11.7* 13.3  HCT 35.4* 39.0  MCV 84.7  --   PLT 536*  --    Cardiac Enzymes: No results for input(s): CKTOTAL, CKMB, CKMBINDEX, TROPONINI in the last 168 hours.  BNP (last 3 results)  Recent Labs  10/18/13 1630  PROBNP 230.4*   CBG: No results for input(s): GLUCAP in the last 168 hours.  Radiological Exams on Admission: Ct Head Wo Contrast  08/23/2014   CLINICAL DATA:  Initial evaluation for syncope.  EXAM: CT HEAD WITHOUT CONTRAST  TECHNIQUE: Contiguous axial images were obtained from the base of the skull through the vertex without intravenous contrast.  COMPARISON:  Prior CT from 08/14/2014.  FINDINGS: Mild diffuse prominence of the CSF containing spaces compatible with generalized cerebral atrophy. Scattered and confluent hypodensity within the periventricular and deep white matter most consistent with chronic small vessel ischemic changes.  No acute large vessel territory infarct. No intracranial hemorrhage. No mass lesion, mass effect, or midline shift. No hydrocephalus. There is no extra-axial fluid collection.  Small right forehead contusion present. No acute abnormality seen about the orbits.  Retention cyst partially visualized within the right maxillary sinus. Paranasal sinuses are otherwise clear. There is opacification of the left mastoid air cells.   IMPRESSION: 1. No acute intracranial process. 2. Small right forehead contusion. 3. Diffusely opacified left mastoid air cells, similar to prior. 4. Probable right maxillary sinus retention cyst, partially visualized.   Electronically Signed   By: Janell Quiet.D.  On: 08/23/2014 02:16    EKG: Independently reviewed. Normal sinus rhythm.  Assessment/Plan Principal Problem:   Syncope and collapse Active Problems:   Chronic daily headache   Pernicious anemia   Non-suppurative otitis media   Syncope   Hypertension   1. Syncope - cause not clear. I did discuss with on-call neurologist Dr. Hosie Poisson who at this time as advised to get Tegretol levels and reviewing his chart patient has had previous episodes of seizure which as per his outpatient neurologist feels may be nonepileptic form. Dr. Hosie Poisson advised that if Tegretol levels are normal then patient may need ambulatory EEG which can be done as outpatient. We will closely follow in telemetry to rule out any arrhythmias. Check 2-D echo. Check orthostatics. Hold HCTZ and gently hydrate for now. Check urine drug screen. 2. Recently diagnosed left-sided otitis media - patient has been placed on IV ceftriaxone for now. 3. Hypertension - continue home medications except for hydrochlorothiazide as patient is getting gentle hydration. 4. Chronic headaches - continue home medication. 5. Asthma - presently not wheezing.   DVT Prophylaxis SCDs as patient has laceration on his scalp we will avoid Lovenox.  Code Status: Full code.  Family Communication: None.  Disposition Plan: Admit for observation.    Cherylin Waguespack N. [facility] Pager [phone].  If 7PM-7AM, please contact night-coverage [email or web address] Password [facility] 08/23/2014, 5:44 AM

## 2014-08-23 NOTE — ED Notes (Signed)
Pillow given

## 2014-08-23 NOTE — ED Notes (Signed)
Dr. Toniann FailKakrakandy gives verbal order for 650mg  of tylenol for headache.

## 2014-08-23 NOTE — ED Provider Notes (Signed)
CSN: [id number]     Arrival date & time 08/22/14  2245 History  This chart was scribed for Loren Racer, MD by Roxy Cedar, ED Scribe. This Martin Singh was seen in room A09C/A09C and the Martin Singh's care was started at 1:05 AM.   Chief Complaint  Martin Singh presents with  . Loss of Consciousness    The Martin Singh called EMS due to a headache and because Martin Singh has had LOC six time since yesterday.  Marland Kitchen Headache   Martin Singh is a 48 y.o. male presenting with syncope and headaches. The history is provided by the Martin Singh. No language interpreter was used.  Loss of Consciousness Episode history:  Multiple Most recent episode:  Today Timing:  Intermittent Progression:  Unchanged Witnessed: yes (by fiance)   Relieved by:  Nothing Worsened by:  Nothing tried Ineffective treatments:  None tried Associated symptoms: dizziness and headaches   Associated symptoms: no fever, no nausea, no shortness of breath, no vomiting and no weakness   Headache Associated symptoms: congestion, dizziness, ear pain, sinus pressure and syncope   Associated symptoms: no abdominal pain, no back pain, no diarrhea, no fever, no myalgias, no nausea, no neck pain, no neck stiffness, no numbness and no vomiting     HPI Comments: Martin Singh is a 48 y.o. male with a history of seizures, hypertension, osteoarthritis, GERD, diverticulosis, and anxiety/depression, who presents to the Emergency Department complaining of multiple syncopal episodes and a headache that began yesterday. Martin Singh reports associated dizziness, lightheadedness, sinus congestion with blood, and epistaxis. Martin Singh states that Martin Singh feels like the room is spinning before having syncopal episodes. Martin Singh reports about 6 episodes of syncope yesterday. Martin Singh reports head injury due to fall. Martin Singh states Martin Singh is currently being treated for an ear infection. Martin Singh denies associated nausea, or abdominal pain. Martin Singh's headache is generalized similar to previous chronic headaches. No focal  weakness or numbness. Unsure whether seizure-like movement. No incontinence or tongue biting.  Past Medical History  Diagnosis Date  . Hypertension   . Anxiety and depression   . Osteoarthrosis, unspecified whether generalized or localized, unspecified site   . Irritable bowel syndrome   . GERD (gastroesophageal reflux disease)     h/o esophagitis  . Allergic rhinitis   . Insomnia   . Diverticulosis   . Alcohol abuse 2012  . Polysubstance abuse   . Seizures   . Alcoholic pancreatitis   . Periumbilical hernia 12/13/12  . Fatty liver 10/08/11  . Internal hemorrhoids 03/13/11  . Hiatal hernia 03/13/11  . Asthmatic bronchitis   . Frequent headaches   . 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 Prediabetes 03/01/2014   Past Surgical History  Procedure Laterality Date  . Tibula surgery Right 2002   Family History  Problem Relation Age of Onset  . Heart disease Mother   . Gallbladder disease Mother   . Nephrolithiasis Mother   . Arthritis Mother   . Hyperlipidemia Mother   . Stroke Mother   . Irritable bowel syndrome Mother   . Cancer Mother     vulva  . Colon cancer Neg Hx   . Hypertension Father     moth  . Heart disease Father   . Arthritis Father   . Irritable bowel syndrome Brother     x 2   History  Substance Use Topics  . Smoking status: Former Smoker -- 1.50 packs/day for 18 years    Types: Cigarettes    Quit date: 08/28/1991  . Smokeless tobacco: Current User  Types: Chew     Comment: currently uses snuff.11/17/13  . Alcohol Use: No     Comment: no alcohol in one yr; drank 35 years alcohol, quit 02/2012   Review of Systems  Constitutional: Negative for fever and chills.  HENT: Positive for congestion, ear pain, nosebleeds and sinus pressure.   Respiratory: Negative for shortness of breath.   Cardiovascular: Positive for syncope.  Gastrointestinal: Negative for nausea, vomiting, abdominal pain, diarrhea and constipation.  Musculoskeletal: Negative for myalgias,  back pain, neck pain and neck stiffness.  Skin: Positive for wound. Negative for rash.  Neurological: Positive for dizziness, syncope, light-headedness and headaches. Negative for weakness and numbness.  All other systems reviewed and are negative.  Allergies  Codeine; Benazepril; Ativan; and Corticosteroids  Home Medications   Prior to Admission medications   Medication Sig Start Date End Date Taking? Authorizing Provider  amoxicillin (AMOXIL) 500 MG capsule Take 1 capsule (500 mg total) by mouth 3 (three) times daily. 08/17/14  Yes Jeanine Luz, FNP  aspirin 81 MG chewable tablet Chew 81 mg by mouth daily.   Yes Historical Provider, MD  atenolol (TENORMIN) 100 MG tablet Take 1 tablet (100 mg total) by mouth daily. 04/23/14  Yes Wanda Plump, MD  carbamazepine (TEGRETOL XR) 200 MG 12 hr tablet Take 2 tablets (400 mg total) by mouth 2 (two) times daily. 07/16/14  Yes Adam Gus Rankin, DO  cetirizine (ZYRTEC) 10 MG tablet Take 1 tablet (10 mg total) by mouth daily. Martin Singh taking differently: Take 10 mg by mouth daily as needed for allergies.  06/29/14  Yes Leslye Peer, MD  cloNIDine (CATAPRES) 0.1 MG tablet Take 0.1 mg by mouth 2 (two) times daily.   Yes Historical Provider, MD  cyclobenzaprine (FLEXERIL) 5 MG tablet Take 1-2tabs TID prn 07/16/14  Yes Adam Gus Rankin, DO  esomeprazole (NEXIUM) 40 MG capsule Take 1 capsule (40 mg total) by mouth daily at 12 noon. 07/15/14  Yes Leslye Peer, MD  fesoterodine (TOVIAZ) 4 MG TB24 tablet Take 4 mg by mouth daily.   Yes Historical Provider, MD  FLUoxetine (PROZAC) 20 MG capsule Take 3 capsules (60 mg total) by mouth at bedtime. 08/17/14  Yes Jeanine Luz, FNP  gabapentin (NEURONTIN) 300 MG capsule Take 1 capsule (300 mg total) by mouth 2 (two) times daily. 08/17/14  Yes Jeanine Luz, FNP  hydrochlorothiazide (HYDRODIURIL) 25 MG tablet Take 1 tablet (25 mg total) by mouth daily. 08/17/14  Yes Jeanine Luz, FNP  hydrOXYzine (VISTARIL) 25  MG capsule Take 1 capsule (25 mg total) by mouth 2 (two) times daily. 08/17/14  Yes Jeanine Luz, FNP  ibuprofen (ADVIL,MOTRIN) 200 MG tablet Take 400 mg by mouth every 6 (six) hours as needed for moderate pain.   Yes Historical Provider, MD  ipratropium (ATROVENT) 0.03 % nasal spray Two sprays each nostril 2-3 times daily prn. 06/23/14  Yes Leslye Peer, MD  methocarbamol (ROBAXIN) 500 MG tablet Take 1 tablet (500 mg total) by mouth 2 (two) times daily. 03/18/14  Yes Loren Racer, MD  mometasone (NASONEX) 50 MCG/ACT nasal spray Place 2 sprays into the nose daily. 06/09/14  Yes Leslye Peer, MD  Multiple Vitamin (MULTIVITAMIN) tablet Take 1 tablet by mouth daily.   Yes Historical Provider, MD  omalizumab Geoffry Paradise) 150 MG injection Inject 300 mg into the skin every 28 (twenty-eight) days.   Yes Historical Provider, MD  predniSONE (DELTASONE) 20 MG tablet Take 1 tablet (20 mg total) by mouth daily with  breakfast. 07/15/14  Yes Leslye Peerobert S Byrum, MD  senna-docusate (SENNA-S) 8.6-50 MG per tablet Take 1 tablet by mouth at bedtime as needed for mild constipation.    Yes Historical Provider, MD  topiramate (TOPAMAX) 25 MG tablet Take 1 tablet (25 mg total) by mouth daily. 05/19/14  Yes Adam Gus Rankinobert Jaffe, DO  albuterol (PROVENTIL HFA;VENTOLIN HFA) 108 (90 BASE) MCG/ACT inhaler Inhale 2 puffs into the lungs every 6 (six) hours as needed for wheezing or shortness of breath. 08/24/14   Leslye Peerobert S Byrum, MD  antipyrine-benzocaine Lyla Son(AURALGAN) otic solution Place 3 drops into the right ear every 2 (two) hours as needed for ear pain. Martin Singh not taking: Reported on 08/23/2014 08/14/14   Trixie DredgeEmily West, PA-C  budesonide-formoterol [facility](SYMBICORT) 160-4.5 MCG/ACT inhaler Inhale 2 puffs into the lungs 2 (two) times daily. 08/24/14   Leslye Peerobert S Byrum, MD  nystatin cream (MYCOSTATIN) Apply 1 application topically 2 (two) times daily.    Historical Provider, MD   Triage Vitals: BP 146/92 mmHg  Pulse 83  Temp(Src) 98 F (36.7  C) (Oral)  Resp 15  Ht 5' (1.524 m)  Wt 178 lb (80.74 kg)  BMI 34.76 kg/m2  SpO2 100%  Physical Exam  Constitutional: Martin Singh is oriented to person, place, and time. Martin Singh appears well-developed and well-nourished. No distress.  HENT:  Head: Normocephalic.  Mouth/Throat: Oropharynx is clear and moist.  Right frontal contusion. Midface is stable. No intraoral trauma.  Eyes: Conjunctivae and EOM are normal. Pupils are equal, round, and reactive to light.  Left TM erythema and bulging.  Neck: Normal range of motion. Neck supple. No tracheal deviation present.  No posterior midline cervical tenderness to palpation. No meningismus.  Cardiovascular: Normal rate and regular rhythm.  Exam reveals no gallop and no friction rub.   No murmur heard. Pulmonary/Chest: Effort normal and breath sounds normal. No respiratory distress. Martin Singh has no wheezes. Martin Singh has no rales. Martin Singh exhibits no tenderness.  Abdominal: Soft. Bowel sounds are normal. Martin Singh exhibits no distension and no mass. There is no tenderness. There is no rebound and no guarding.  Musculoskeletal: Normal range of motion. Martin Singh exhibits no edema or tenderness.  No thoracic or lumbar tenderness to palpation. Distal pulses intact.  Neurological: Martin Singh is alert and oriented to person, place, and time.  Martin Singh is alert and oriented x3 with clear, goal oriented speech. Martin Singh has 5/5 motor in all extremities. Sensation is intact to light touch. Bilateral finger-to-nose is normal with no signs of dysmetria. Martin Singh has a normal gait and walks without assistance.  Skin: Skin is warm and dry. No rash noted. No erythema.  Psychiatric: Martin Singh has a normal mood and affect. His behavior is normal.  Nursing note and vitals reviewed.  ED Course  Procedures (including critical care time)  DIAGNOSTIC STUDIES: Oxygen Saturation is 100% on RA, normal by my interpretation.    COORDINATION OF CARE: 1:12 AM- Discussed plans to order diagnostic lab work and EKG. Pt advised of  plan for treatment and pt agrees.  Labs Review Labs Reviewed  CBC WITH DIFFERENTIAL - Abnormal; Notable for the following:    RBC 4.18 (*)    Hemoglobin 11.7 (*)    HCT 35.4 (*)    Platelets 536 (*)    All other components within normal limits  BASIC METABOLIC PANEL - Abnormal; Notable for the following:    Sodium 134 (*)    Glucose, Bld 116 (*)    All other components within normal limits  COMPREHENSIVE METABOLIC PANEL -  Abnormal; Notable for the following:    Albumin 3.3 (*)    All other components within normal limits  CBC WITH DIFFERENTIAL - Abnormal; Notable for the following:    RBC 4.13 (*)    Hemoglobin 11.7 (*)    HCT 35.6 (*)    Platelets 439 (*)    All other components within normal limits  CBC - Abnormal; Notable for the following:    RBC 4.10 (*)    Hemoglobin 11.3 (*)    HCT 35.2 (*)    Platelets 449 (*)    All other components within normal limits  CBG MONITORING, ED - Abnormal; Notable for the following:    Glucose-Capillary 122 (*)    All other components within normal limits  I-STAT CHEM 8, ED - Abnormal; Notable for the following:    Glucose, Bld 115 (*)    All other components within normal limits  MRSA PCR SCREENING  URINE RAPID DRUG SCREEN (HOSP PERFORMED)  CARBAMAZEPINE LEVEL, TOTAL  TROPONIN I  BASIC METABOLIC PANEL   Imaging Review No results found.   EKG Interpretation   Date/Time:  Sunday August 22 2014 22:51:21 EST Ventricular Rate:  88 PR Interval:  138 QRS Duration: 84 QT Interval:  390 QTC Calculation: 471 R Axis:   59 Text Interpretation:  Normal sinus rhythm Normal ECG ED PHYSICIAN  INTERPRETATION AVAILABLE IN [facility] Confirmed by TEST, Record  ([id number]) on 08/24/2014 8:43:59 AM     MDM   Final diagnoses:  Syncope and collapse    Discussed with hospitalist service will admit for observation for multiple syncopal episodes. Not sure whether this is seizure related not. Martin Singh has a normal neurologic exam at this  point. CT without any acute intracranial injuries.  I personally performed the services described in this documentation, which was scribed in my presence. The recorded information has been reviewed and is accurate.  Loren Raceravid Georgi Navarrete, MD 08/26/14 1110

## 2014-08-23 NOTE — Progress Notes (Signed)
  Echocardiogram 2D Echocardiogram has been performed.  Martin Singh 08/23/2014, 3:20 PM

## 2014-08-23 NOTE — ED Notes (Signed)
Dr. Kakrakandy, hospitalist, at the bedside. 

## 2014-08-23 NOTE — ED Notes (Signed)
Pt states he has a hx of seizures but they have been controlled for a while. States he has had about 6 syncopal episodes since yesterday. This evening he was in his kitchen making food and then woke up on the floor, pt has laceration to rt forehead. Pt is alert, oriented, no deficits noted. Pt reports that he has a bad ear infx and just started meds for it yesterday then began having some problems feeling off balance yesterday as well. States syncopal episodes began after he started his antibiotic yesterday. Has taken med before and never had any issues.

## 2014-08-24 ENCOUNTER — Telehealth: Payer: Self-pay | Admitting: Emergency Medicine

## 2014-08-24 LAB — BASIC METABOLIC PANEL
Anion gap: 9 (ref 5–15)
BUN: 11 mg/dL (ref 6–23)
CHLORIDE: 105 meq/L (ref 96–112)
CO2: 24 mmol/L (ref 19–32)
CREATININE: 0.84 mg/dL (ref 0.50–1.35)
Calcium: 9 mg/dL (ref 8.4–10.5)
GFR calc Af Amer: >90 mL/min (ref >90)
GFR calc non Af Amer: >90 mL/min (ref >90)
Glucose, Bld: 89 mg/dL (ref 70–99)
POTASSIUM: 3.5 mmol/L (ref 3.5–5.1)
Sodium: 138 mmol/L (ref 135–145)

## 2014-08-24 LAB — CBC
HEMATOCRIT: 35.2 % — AB (ref 39.0–52.0)
Hemoglobin: 11.3 g/dL — ABNORMAL LOW (ref 13.0–17.0)
MCH: 27.6 pg (ref 26.0–34.0)
MCHC: 32.1 g/dL (ref 30.0–36.0)
MCV: 85.9 fL (ref 78.0–100.0)
Platelets: 449 10*3/uL — ABNORMAL HIGH (ref 150–400)
RBC: 4.1 MIL/uL — AB (ref 4.22–5.81)
RDW: 12.7 % (ref 11.5–15.5)
WBC: 7 10*3/uL (ref 4.0–10.5)

## 2014-08-24 MED ORDER — BUDESONIDE-FORMOTEROL FUMARATE 160-4.5 MCG/ACT IN AERO: 2.0000 | INHALATION_SPRAY | Freq: Two times a day (BID) | RESPIRATORY_TRACT | Status: DC

## 2014-08-24 MED ORDER — ALBUTEROL SULFATE HFA 108 (90 BASE) MCG/ACT IN AERS: 2.0000 | INHALATION_SPRAY | Freq: Four times a day (QID) | RESPIRATORY_TRACT | Status: DC | PRN

## 2014-08-24 NOTE — Telephone Encounter (Signed)
Thanks

## 2014-08-24 NOTE — Discharge Summary (Signed)
Physician Discharge Summary  Martin Singh ZOX:[id number] DOB: [birth year] DOA: 08/23/2014  PCP: Jeanine Luzalone, Gregory, FNP  Admit date: 08/23/2014 Discharge date: 08/24/2014  Time spent: > 35 minutes  Recommendations for Outpatient Follow-up:  1. Patient will need ambulatory EEG  Discharge Diagnoses:  Principal Problem:   Syncope and collapse Active Problems:   Chronic daily headache   Pernicious anemia   Non-suppurative otitis media   Syncope   Hypertension   Discharge Condition: stable  Diet recommendation: low sodium diet  Filed Weights   08/22/14 2248 08/23/14 1239 08/24/14 0617  Weight: 80.74 kg (178 lb) 79.7 kg (175 lb 11.3 oz) 80.74 kg (178 lb)    History of present illness:  48 y/o with history of asthma, htn, and seizure that presented to the hospital after a syncopal episode  Hospital Course:  Syncopal episode - Most likely breakthrough seizure - tegretol level wnl - Echocardiogram showing grade 2 DD Normal ef 60-65% - Patient will be discharge with plans for f/u appointment with neurologist in 1-2 weeks for ambulatory eeg - UDS negative  Have reinforced that patient is not to drive, swim in lakes, or take baths.  Procedures:  As listed above  Consultations:  Discussed with Neurology  Discharge Exam: Filed Vitals:   08/24/14 0617  BP: 148/100  Pulse: 61  Temp: 97.8 F (36.6 C)  Resp: 18    General: Pt in nad, alert and awake Cardiovascular: rrr, normal  Respiratory: CTA BL, no wheezes  Discharge Instructions   Discharge Instructions    Diet - low sodium heart healthy    Complete by:  As directed      Discharge instructions    Complete by:  As directed   F/u with neurologist in 1-2 weeks for ambulatory EEG     Increase activity slowly    Complete by:  As directed           Current Discharge Medication List    CONTINUE these medications which have NOT CHANGED   Details  albuterol (PROVENTIL HFA;VENTOLIN HFA) 108 (90 BASE) MCG/ACT  inhaler Inhale 2 puffs into the lungs every 6 (six) hours as needed for wheezing or shortness of breath. Qty: 1 Inhaler, Refills: 2    amoxicillin (AMOXIL) 500 MG capsule Take 1 capsule (500 mg total) by mouth 3 (three) times daily. Qty: 30 capsule, Refills: 0    aspirin 81 MG chewable tablet Chew 81 mg by mouth daily.    atenolol (TENORMIN) 100 MG tablet Take 1 tablet (100 mg total) by mouth daily. Qty: 30 tablet, Refills: 2    budesonide-formoterol (SYMBICORT) 160-4.5 MCG/ACT inhaler Inhale 2 puffs into the lungs 2 (two) times daily. Qty: 1 Inhaler, Refills: 2    carbamazepine (TEGRETOL XR) 200 MG 12 hr tablet Take 2 tablets (400 mg total) by mouth 2 (two) times daily. Qty: 360 tablet, Refills: 1    cetirizine (ZYRTEC) 10 MG tablet Take 1 tablet (10 mg total) by mouth daily. Qty: 30 tablet, Refills: 6    cloNIDine (CATAPRES) 0.1 MG tablet Take 0.1 mg by mouth 2 (two) times daily.    cyclobenzaprine (FLEXERIL) 5 MG tablet Take 1-2tabs TID prn Qty: 90 tablet, Refills: 1    esomeprazole (NEXIUM) 40 MG capsule Take 1 capsule (40 mg total) by mouth daily at 12 noon. Qty: 60 capsule, Refills: 2    fesoterodine (TOVIAZ) 4 MG TB24 tablet Take 4 mg by mouth daily.    FLUoxetine (PROZAC) 20 MG capsule Take 3 capsules (60  mg total) by mouth at bedtime. Qty: 180 capsule, Refills: 0    gabapentin (NEURONTIN) 300 MG capsule Take 1 capsule (300 mg total) by mouth 2 (two) times daily. Qty: 60 capsule, Refills: 0    hydrochlorothiazide (HYDRODIURIL) 25 MG tablet Take 1 tablet (25 mg total) by mouth daily. Qty: 30 tablet, Refills: 0    hydrOXYzine (VISTARIL) 25 MG capsule Take 1 capsule (25 mg total) by mouth 2 (two) times daily. Qty: 60 capsule, Refills: 0    ibuprofen (ADVIL,MOTRIN) 200 MG tablet Take 400 mg by mouth every 6 (six) hours as needed for moderate pain.    ipratropium (ATROVENT) 0.03 % nasal spray Two sprays each nostril 2-3 times daily prn. Qty: 30 mL, Refills: 5    Associated Diagnoses: Allergic rhinitis, unspecified allergic rhinitis type    methocarbamol (ROBAXIN) 500 MG tablet Take 1 tablet (500 mg total) by mouth 2 (two) times daily. Qty: 20 tablet, Refills: 0    mometasone (NASONEX) 50 MCG/ACT nasal spray Place 2 sprays into the nose daily. Qty: 17 g, Refills: 11    Multiple Vitamin (MULTIVITAMIN) tablet Take 1 tablet by mouth daily.    omalizumab (XOLAIR) 150 MG injection Inject 300 mg into the skin every 28 (twenty-eight) days.    predniSONE (DELTASONE) 20 MG tablet Take 1 tablet (20 mg total) by mouth daily with breakfast. Qty: 30 tablet, Refills: 1    senna-docusate (SENNA-S) 8.6-50 MG per tablet Take 1 tablet by mouth at bedtime as needed for mild constipation.     topiramate (TOPAMAX) 25 MG tablet Take 1 tablet (25 mg total) by mouth daily. Qty: 120 tablet, Refills: 3   Associated Diagnoses: Headache(784.0)    antipyrine-benzocaine (AURALGAN) otic solution Place 3 drops into the right ear every 2 (two) hours as needed for ear pain. Qty: 10 mL, Refills: 0    nystatin cream (MYCOSTATIN) Apply 1 application topically 2 (two) times daily.      STOP taking these medications     diphenhydrAMINE (BENADRYL) 25 MG tablet      naproxen (NAPROSYN) 500 MG tablet      triamcinolone cream (KENALOG) 0.1 %        Allergies  Allergen Reactions  . Codeine Anaphylaxis  . Benazepril Cough  . Ativan [Lorazepam] Other (See Comments)    Becomes violent and hallucinates  . Corticosteroids Other (See Comments)    Shaky and high blood pressure      The results of significant diagnostics from this hospitalization (including imaging, microbiology, ancillary and laboratory) are listed below for reference.    Significant Diagnostic Studies: Ct Head Wo Contrast  08/23/2014   CLINICAL DATA:  Initial evaluation for syncope.  EXAM: CT HEAD WITHOUT CONTRAST  TECHNIQUE: Contiguous axial images were obtained from the base of the skull through the  vertex without intravenous contrast.  COMPARISON:  Prior CT from 08/14/2014.  FINDINGS: Mild diffuse prominence of the CSF containing spaces compatible with generalized cerebral atrophy. Scattered and confluent hypodensity within the periventricular and deep white matter most consistent with chronic small vessel ischemic changes.  No acute large vessel territory infarct. No intracranial hemorrhage. No mass lesion, mass effect, or midline shift. No hydrocephalus. There is no extra-axial fluid collection.  Small right forehead contusion present. No acute abnormality seen about the orbits.  Retention cyst partially visualized within the right maxillary sinus. Paranasal sinuses are otherwise clear. There is opacification of the left mastoid air cells.  IMPRESSION: 1. No acute intracranial process. 2. Small right  forehead contusion. 3. Diffusely opacified left mastoid air cells, similar to prior. 4. Probable right maxillary sinus retention cyst, partially visualized.   Electronically Signed   By: Rise Mu M.D.   On: 08/23/2014 02:16   Ct Temporal Bones W/cm  08/14/2014   CLINICAL DATA:  48 year old male with left ear pain and redness. Hearing loss. Symptoms for 3 months. Initial encounter.  EXAM: CT TEMPORAL BONES WITH CONTRAST  TECHNIQUE: Axial and coronal plane CT imaging of the petrous temporal bones was performed with thin-collimation image reconstruction after intravenous contrast administration. Multiplanar CT image reconstructions were also generated.  CONTRAST:  OMNIPAQUE IOHEXOL 300 MG/ML  SOLN  COMPARISON:  Paranasal sinus CT 07/09/2014, and earlier.  FINDINGS: Visualized brain parenchyma appear stable. Major vascular structures are patent, including the left sigmoid sinus and IJ bulb.  Bilateral periauricular soft tissues are within normal limits. Visualized deep soft tissue spaces of the face are within normal limits. Visualized orbit soft tissues are within normal limits.  Right greater  than left maxillary sinus fluid levels. Mild to moderate paranasal sinus mucosal thickening elsewhere.  RIGHT TEMPORAL BONE:  Normal EAC, tympanic membrane, tympanic cavity, ossicles. The right mastoids are clear.  Right IAC, cochlea, vestibule, semicircular canals, and vestibular aqueduct are within normal limits. Negative course of the right seventh nerve.  LEFT TEMPORAL BONE:  Soft tissue thickening of the EAC (series 10, image 79). Complete opacification of the left tympanic cavity, obscuring the left tympanic membrane. The scutum and left ossicles appear intact. Subtotal opacification of the left mastoid air cells, with numerous mastoid air-fluid levels. No mastoid coalescence. Sigmoid plate intact.  Left IAC, cochlea, vestibule, semicircular canals, and vestibular aqueduct are within normal limits.  Pneumatized left petrous apex.  Normal left stylomastoid foramen.  Negative nasopharynx.  IMPRESSION: 1. Diffusely opacified left middle ear and mastoids. Soft tissue thickening of the left EAC. No associated osseous erosion, mastoid coalescence, or complicating features identified. Favor infectious otitis. 2. Normal right temporal bone. 3. Paranasal sinusitis, most involving the maxillary sinuses.   Electronically Signed   By: Augusto Gamble M.D.   On: 08/14/2014 19:18    Microbiology: Recent Results (from the past 240 hour(s))  MRSA PCR Screening     Status: None   Collection Time: 08/23/14  1:02 PM  Result Value Ref Range Status   MRSA by PCR NEGATIVE NEGATIVE Final    Comment:        The GeneXpert MRSA Assay (FDA approved for NASAL specimens only), is one component of a comprehensive MRSA colonization surveillance program. It is not intended to diagnose MRSA infection nor to guide or monitor treatment for MRSA infections.      Labs: Basic Metabolic Panel:  Recent Labs Lab 08/22/14 2315 08/22/14 2331 08/23/14 0830 08/24/14 0638  NA 134* 136 135 138  K 3.9 3.9 3.6 3.5  CL 97 97 99 105   CO2 27  --  26 24  GLUCOSE 116* 115* 97 89  BUN 10 12 15 11   CREATININE 0.84 0.90 0.92 0.84  CALCIUM 9.5  --  9.2 9.0   Liver Function Tests:  Recent Labs Lab 08/23/14 0830  AST 26  ALT 51  ALKPHOS 86  BILITOT 0.4  PROT 6.7  ALBUMIN 3.3*   No results for input(s): LIPASE, AMYLASE in the last 168 hours. No results for input(s): AMMONIA in the last 168 hours. CBC:  Recent Labs Lab 08/22/14 2315 08/22/14 2331 08/23/14 0830 08/24/14 0638  WBC 7.7  --  6.9 7.0  NEUTROABS 5.3  --  3.7  --   HGB 11.7* 13.3 11.7* 11.3*  HCT 35.4* 39.0 35.6* 35.2*  MCV 84.7  --  86.2 85.9  PLT 536*  --  439* 449*   Cardiac Enzymes:  Recent Labs Lab 08/23/14 0830  TROPONINI <0.03   BNP: BNP (last 3 results)  Recent Labs  10/18/13 1630  PROBNP 230.4*   CBG:  Recent Labs Lab 08/22/14 2309  GLUCAP 122*       Signed:  Penny Pia  [facility] 08/24/2014, 10:13 AM

## 2014-08-24 NOTE — Telephone Encounter (Signed)
Called and spoke with pt caregiver and she is aware of refills that have been sent in .  She wanted to let RB know that the pt just got out of the hospital today and they decided that his issues were coming from seizures.  Nothing further is needed.

## 2014-08-24 NOTE — Progress Notes (Signed)
Iv access to pt's Rt hand d/c prior to d/c.  Amanda PeaNellie Sequan Auxier, Charity fundraiserN.

## 2014-08-24 NOTE — Progress Notes (Signed)
All d/c instructions gien and explained to pt at 1200.  Verbalized understanding.  D/c off [facility] to awaiting transport via w/c.

## 2014-08-31 ENCOUNTER — Ambulatory Visit: Payer: Self-pay | Admitting: Internal Medicine

## 2014-09-02 ENCOUNTER — Encounter: Payer: Self-pay | Admitting: Neurology

## 2014-09-02 ENCOUNTER — Ambulatory Visit (INDEPENDENT_AMBULATORY_CARE_PROVIDER_SITE_OTHER): Payer: Self-pay | Admitting: Neurology

## 2014-09-02 VITALS — BP 128/68 | HR 74 | Temp 98.4°F | Resp 16 | Ht 61.0 in | Wt 181.3 lb

## 2014-09-02 DIAGNOSIS — R55 Syncope and collapse: Secondary | ICD-10-CM

## 2014-09-02 DIAGNOSIS — R569 Unspecified convulsions: Secondary | ICD-10-CM

## 2014-09-02 NOTE — Patient Instructions (Addendum)
I believe these episodes are related to stress.  Appropriate management would be talking with the therapist and psychiatrist.  No driving.  Continue Tegretol.  Follow up in April.

## 2014-09-02 NOTE — Progress Notes (Signed)
NEUROLOGY FOLLOW UP OFFICE NOTE  Martin Singh [id number]  HISTORY OF PRESENT ILLNESS: Martin Singh is a 49 year old man with history of cerebrovascular disease, back pain, pernicious anemia and history of substance abuse who is seen for chronic daily headaches and seizure-like episodes, presents for dizziness and falls.  He is accompanied by his girlfriend. ED note and labs reviewed.   UPDATE: Over the past couple of weeks, he began having multiple episodes of syncope.  His girlfriend has counted 14.  He will suddenly feel dizzy and then pass out for 1 to 2 minutes.  There was no associated tongue biting or incontinence.  There are no convulsions.  He is confused for just a minute when he wakes up.  His girlfriend says he behaves irritable for a minute or two before and after passing out.  He hit his head and sustained a laceration.  Prior to this, he developed left ear pain, tinnitus and hearing loss.  He was reportedly running a low-grade fever.  He was started on amoxicillin.  He presented to the ED on 08/23/14.  Echocardiogram and EKG were unremarkable.  CMP was unremarkable.  Tegretol level was 4.6.  Amoxicillin was switched to Rocephin.  He reports increased stress.  He has been having increased financial problems, particularly dealing with getting his disability.  He also has not been in contact with his son and this was more painful during the holidays.  It is also soon to be the one year anniversary of his mother's death.  HISTORY: SEIZURE-LIKE EPISODES: Onset of seizures started about 2 and a half years ago, around the time he quit alcohol and lost custody of his son.  It started around the time he quit alcohol.  He will suddenly feel his whole body stiffen and shake.  He does not lose awareness.  It lasts for a couple of minutes.  No postictal state or preceding aura.  No urinary incontinence.  No tongue biting.  Will sometimes note urinary urgency but usually able to hold it in.  Stress  and panic attacks trigger them.  He also has spells that occur while asleep, as witnessed by his caretaker.  In his sleep, he would stiffen with flexed posturing of his arms and start shaking for a couple of minutes.  Sometimes he would awake but other times he would remain asleep.  In the morning, he sometimes he has wet the bed.  Spells occur about 2 times a week but can fluctuate, sometimes goes a couple of weeks without episode.  He takes Tegretol 200/200/400.  Tegretol level from 07/22/13 was 11.7.  10/18/13 LABS:  Na 141, K 4.1, Cl 102, HCO2 27, gluc 100, BUN 26, Cr 1.47, Ca 9, TB 7, Alb 4.1, AST 29, ALT 27, AP 47, TB <0.2, DB <0.2  CHRONIC TENSION HEADACHES: History of migraines but has been daily for the past 6 months.  It's a nonthrobbing pressure-like pain in a band-like distribution, radiating into his neck, about 7/10.  It is associated with nausea, but not photophobia or phonophobia.  Resting and ice packs help relieve.  Activity and neck movement makes it worse.  Advil, Tylenol and Excedrin are not effective.  He was taking them daily but not anymore.  He does have neck pain.  He was initially on nortriptyline, which was discontinued after it was thought to have contributed to suicidal ideation.  He was titrated up on atenolol to  daily.  He went to PT for the  neck, which helped.   Currently takes: He takes Topamax  at bedtime and tizanidine  for neck spasms.  He also takes atenolol  (also used for blood pressure). He also has back pain, for which he takes gabapentin  twice daily and Robaxin.  MRI of the lumbar spine from 04/27/14 was personally reviewed and showed minimally progressive spondylosis with mild right and borderline left foraminal stenosis at L5-S1 level.  PAST MEDICAL HISTORY: Past Medical History  Diagnosis Date  . Hypertension   . Anxiety and depression   . Osteoarthrosis, unspecified whether generalized or localized, unspecified site   . Irritable  bowel syndrome   . GERD (gastroesophageal reflux disease)     h/o esophagitis  . Allergic rhinitis   . Insomnia   . Diverticulosis   . Alcohol abuse 2012  . Polysubstance abuse   . Seizures   . Alcoholic pancreatitis   . Periumbilical hernia 12/13/12  . Fatty liver 10/08/11  . Internal hemorrhoids 03/13/11  . Hiatal hernia 03/13/11  . Asthmatic bronchitis   . Frequent headaches   . 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 Prediabetes 03/01/2014    MEDICATIONS: Current Outpatient Prescriptions on File Prior to Visit  Medication Sig Dispense Refill  . albuterol (PROVENTIL HFA;VENTOLIN HFA) 108 (90 BASE) MCG/ACT inhaler Inhale 2 puffs into the lungs every 6 (six) hours as needed for wheezing or shortness of breath. 3 Inhaler 3  . amoxicillin (AMOXIL) 500 MG capsule Take 1 capsule (500 mg total) by mouth 3 (three) times daily. 30 capsule 0  . antipyrine-benzocaine (AURALGAN) otic solution Place 3 drops into the right ear every 2 (two) hours as needed for ear pain. 10 mL 0  . aspirin 81 MG chewable tablet Chew 81 mg by mouth daily.    Marland Kitchen atenolol (TENORMIN) 100 MG tablet Take 1 tablet (100 mg total) by mouth daily. 30 tablet 2  . budesonide-formoterol (SYMBICORT) 160-4.5 MCG/ACT inhaler Inhale 2 puffs into the lungs 2 (two) times daily. 3 Inhaler 3  . carbamazepine (TEGRETOL XR) 200 MG 12 hr tablet Take 2 tablets (400 mg total) by mouth 2 (two) times daily. 360 tablet 1  . cetirizine (ZYRTEC) 10 MG tablet Take 1 tablet (10 mg total) by mouth daily. (Patient taking differently: Take 10 mg by mouth daily as needed for allergies. ) 30 tablet 6  . cloNIDine (CATAPRES) 0.1 MG tablet Take 0.1 mg by mouth 2 (two) times daily.    . cyclobenzaprine (FLEXERIL) 5 MG tablet Take 1-2tabs TID prn 90 tablet 1  . esomeprazole (NEXIUM) 40 MG capsule Take 1 capsule (40 mg total) by mouth daily at 12 noon. 60 capsule 2  . fesoterodine (TOVIAZ) 4 MG TB24 tablet Take 4 mg by mouth daily.    Marland Kitchen FLUoxetine (PROZAC) 20 MG  capsule Take 3 capsules (60 mg total) by mouth at bedtime. 180 capsule 0  . gabapentin (NEURONTIN) 300 MG capsule Take 1 capsule (300 mg total) by mouth 2 (two) times daily. 60 capsule 0  . hydrochlorothiazide (HYDRODIURIL) 25 MG tablet Take 1 tablet (25 mg total) by mouth daily. 30 tablet 0  . hydrOXYzine (VISTARIL) 25 MG capsule Take 1 capsule (25 mg total) by mouth 2 (two) times daily. 60 capsule 0  . ibuprofen (ADVIL,MOTRIN) 200 MG tablet Take 400 mg by mouth every 6 (six) hours as needed for moderate pain.    Marland Kitchen ipratropium (ATROVENT) 0.03 % nasal spray Two sprays each nostril 2-3 times daily prn. 30 mL 5  . methocarbamol (  ROBAXIN) 500 MG tablet Take 1 tablet (500 mg total) by mouth 2 (two) times daily. 20 tablet 0  . mometasone (NASONEX) 50 MCG/ACT nasal spray Place 2 sprays into the nose daily. 17 g 11  . Multiple Vitamin (MULTIVITAMIN) tablet Take 1 tablet by mouth daily.    Marland Kitchen. nystatin cream (MYCOSTATIN) Apply 1 application topically 2 (two) times daily.    Marland Kitchen. omalizumab (XOLAIR) 150 MG injection Inject 300 mg into the skin every 28 (twenty-eight) days.    . predniSONE (DELTASONE) 20 MG tablet Take 1 tablet (20 mg total) by mouth daily with breakfast. 30 tablet 1  . senna-docusate (SENNA-S) 8.6-50 MG per tablet Take 1 tablet by mouth at bedtime as needed for mild constipation.     . topiramate (TOPAMAX) 25 MG tablet Take 1 tablet (25 mg total) by mouth daily. 120 tablet 3   No current facility-administered medications on file prior to visit.    ALLERGIES: Allergies  Allergen Reactions  . Codeine Anaphylaxis  . Benazepril Cough  . Ativan [Lorazepam] Other (See Comments)    Becomes violent and hallucinates  . Corticosteroids Other (See Comments)    Shaky and high blood pressure    FAMILY HISTORY: Family History  Problem Relation Age of Onset  . Heart disease Mother   . Gallbladder disease Mother   . Nephrolithiasis Mother   . Arthritis Mother   . Hyperlipidemia Mother   .  Stroke Mother   . Irritable bowel syndrome Mother   . Cancer Mother     vulva  . Colon cancer Neg Hx   . Hypertension Father     moth  . Heart disease Father   . Arthritis Father   . Irritable bowel syndrome Brother     x 2    SOCIAL HISTORY: History   Social History  . Marital Status: Divorced    Spouse Name: N/A    Number of Children: 1  . Years of Education: N/A   Occupational History  . unemployed-- apllying for disability    Social History Main Topics  . Smoking status: Former Smoker -- 1.50 packs/day for 18 years    Types: Cigarettes    Quit date: 08/28/1991  . Smokeless tobacco: Current User    Types: Snuff     Comment: currently uses snuff.11/17/13  . Alcohol Use: No     Comment: no alcohol in one yr; drank 35 years alcohol, quit 02/2012  . Drug Use: No     Comment: (denies current use) benzos- ; hx of THC, cocaine, uppers/downers  . Sexual Activity:    Partners: Female     Comment: no birth control   Other Topics Concern  . Not on file   Social History Narrative   Lives w/ fiancee     REVIEW OF SYSTEMS: Constitutional: No fevers, chills, or sweats, no generalized fatigue, change in appetite Eyes: No visual changes, double vision, eye pain Ear, nose and throat: No hearing loss, ear pain, nasal congestion, sore throat Cardiovascular: No chest pain, palpitations Respiratory:  No shortness of breath at rest or with exertion, wheezes GastrointestinaI: No nausea, vomiting, diarrhea, abdominal pain, fecal incontinence Genitourinary:  No dysuria, urinary retention or frequency Musculoskeletal:  No neck pain, back pain Integumentary: No rash, pruritus, skin lesions Neurological: as above Psychiatric: No depression, insomnia, anxiety Endocrine: No palpitations, fatigue, diaphoresis, mood swings, change in appetite, change in weight, increased thirst Hematologic/Lymphatic:  No anemia, purpura, petechiae. Allergic/Immunologic: no itchy/runny eyes, nasal  congestion, recent allergic  reactions, rashes  PHYSICAL EXAM: Filed Vitals:   09/02/14 1155  BP: 128/68  Pulse: 74  Temp: 98.4 F (36.9 C)  Resp: 16   General: No acute distress Head:  Normocephalic/atraumatic Eyes:  Fundoscopic exam unremarkable without vessel changes, exudates, hemorrhages or papilledema. Neck: supple, no paraspinal tenderness, full range of motion Heart:  Regular rate and rhythm Lungs:  Clear to auscultation bilaterally Back: No paraspinal tenderness Neurological Exam: alert and oriented to person, place, and time. Attention span and concentration intact, recent and remote memory intact, fund of knowledge intact.  Speech fluent and not dysarthric, language intact.  CN II-XII intact. Fundoscopic exam unremarkable without vessel changes, exudates, hemorrhages or papilledema.  Bulk and tone normal, muscle strength 5/5 throughout.  Sensation to light touch, temperature and vibration intact.  Deep tendon reflexes 2+ throughout, toes downgoing.  Finger to nose and heel to shin testing intact.  Gait unsteady, Romberg with sway  IMPRESSION: Syncope, likely psychogenic.  PLAN: Appropriate therapy would be counseling.  He has increased his therapy sessions to once a week.  He would also benefit from seeing a psychiatrist. Even though I don't suspect these events or his other spells to be epileptic in nature, he will continue Tegretol as it is a mood stabilizer and has done better since on it. No driving Follow up in April  Shon Millet, DO  CC: Levy Pupa, MD

## 2014-09-07 ENCOUNTER — Encounter: Payer: Self-pay | Admitting: Family

## 2014-09-07 ENCOUNTER — Ambulatory Visit (INDEPENDENT_AMBULATORY_CARE_PROVIDER_SITE_OTHER): Payer: No Typology Code available for payment source | Admitting: Adult Health

## 2014-09-07 ENCOUNTER — Encounter: Payer: Self-pay | Admitting: Adult Health

## 2014-09-07 ENCOUNTER — Ambulatory Visit (INDEPENDENT_AMBULATORY_CARE_PROVIDER_SITE_OTHER): Payer: No Typology Code available for payment source

## 2014-09-07 ENCOUNTER — Ambulatory Visit (INDEPENDENT_AMBULATORY_CARE_PROVIDER_SITE_OTHER): Payer: No Typology Code available for payment source | Admitting: Family

## 2014-09-07 VITALS — BP 112/74 | HR 75 | Temp 98.2°F | Ht 61.0 in | Wt 179.6 lb

## 2014-09-07 VITALS — BP 160/98 | HR 82 | Temp 98.2°F | Resp 18 | Wt 180.1 lb

## 2014-09-07 DIAGNOSIS — Z0000 Encounter for general adult medical examination without abnormal findings: Secondary | ICD-10-CM | POA: Insufficient documentation

## 2014-09-07 DIAGNOSIS — J4520 Mild intermittent asthma, uncomplicated: Secondary | ICD-10-CM

## 2014-09-07 DIAGNOSIS — Z23 Encounter for immunization: Secondary | ICD-10-CM

## 2014-09-07 MED ORDER — CLONIDINE HCL 0.1 MG PO TABS: 0.1000 mg | ORAL_TABLET | Freq: Two times a day (BID) | ORAL | Status: DC

## 2014-09-07 MED ORDER — BUDESONIDE-FORMOTEROL FUMARATE 160-4.5 MCG/ACT IN AERO: 2.0000 | INHALATION_SPRAY | Freq: Two times a day (BID) | RESPIRATORY_TRACT | Status: DC

## 2014-09-07 MED ORDER — PREDNISONE 10 MG PO TABS: ORAL_TABLET | ORAL | Status: DC

## 2014-09-07 MED ORDER — CLONAZEPAM 0.5 MG PO TABS: 0.5000 mg | ORAL_TABLET | Freq: Two times a day (BID) | ORAL | Status: DC | PRN

## 2014-09-07 MED ORDER — ALBUTEROL SULFATE HFA 108 (90 BASE) MCG/ACT IN AERS: 2.0000 | INHALATION_SPRAY | Freq: Four times a day (QID) | RESPIRATORY_TRACT | Status: DC | PRN

## 2014-09-07 NOTE — Addendum Note (Signed)
Addended by: Mercer PodWRENN, Asti Mackley E on: 09/07/2014 10:32 AM   Modules accepted: Orders

## 2014-09-07 NOTE — Progress Notes (Signed)
Pre visit review using our clinic review tool, if applicable. No additional management support is needed unless otherwise documented below in the visit note. 

## 2014-09-07 NOTE — Assessment & Plan Note (Signed)
Frequent asthma flare /steroid dependent -improving on Xolair  Will try to taper steroids slowly   Plan  Keep up good work .  Please continue all of you maintenance medications as you have been taking them.  Continue on Xolair  Decrease Prednisone 15mg  daily begin 09/13/13 .  Follow with Dr Delton CoombesByrum in in 2 months and As needed

## 2014-09-07 NOTE — Patient Instructions (Addendum)
Keep up good work .  Please continue all of you maintenance medications as you have been taking them.  Continue on Xolair  Decrease Prednisone 15mg  daily begin 09/13/13 .  Follow with Dr Delton CoombesByrum in in 1 month and As needed

## 2014-09-07 NOTE — Progress Notes (Signed)
Subjective:    Patient ID: Martin Singh, male    DOB: 07-Mar-1966, 49 y.o.   MRN: [id number]  HPI  49 yo man, 28 pk-years tobacco, seizures, depression, EtOH and substance use, HH. Presents for eval of SOB that has been noticeable for almost 20 years. He was dx with suspected asthma at that time.  He stopped smoking about 20 yrs ago. He is having severe DOE with minimal exertion. He is having higher BP and HA after atenolol stopped recently.  He uses albuterol about 1-2x a day. Has a regular dry cough. Has some exertional wheeze. He snores and has witnessed apneas.  He has a lot of sinus drainage, congestion, some bleeding.  Has GERD that is managed by nexium.   He was seen in the ED on 10/18/13 for leg pain and edema and intermittent shortness of breath after a long bus ride / plane ride. His mother had passed away. Workup for PE was negative. He returned to the ED on 10/30/13 for back pain after he fell due to slipping down the stairs. XR of lumbar spine revealed mild degenerative changes at L5-S1 but no acute fractures. He has been taking naproxen a couple of times a day.   ROV 12/08/13 -- follows up for dyspnea, sinus disease. 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 He is on flonase.   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 Last time we started Symbicort > returns to discuss. He was less SOB after starting. For the last week he has been having more cough, white mucous, some fever, fatigue. 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  He has been having fever, PND, cough prod of tan mucous, HA, nausea, beginning 7+ days ago. His PSG showed some obstruction but no formal OSA.   ROV 06/09/14 -- COPD/asthma. His AFL is mild and sx would appear to be out of proportion. He has had frequent flares of apparent  bronchitis, purulent cough. He has a lot of yellow sinus drainage with HA, sinus pressure. He has been treated with abx and pred x2 since last visit. He is using benadryl, nexium, zantac. He is on symbicort + SABA, uses .   ROV 07/15/14 - COPD and asthma, mild AFL, frequent flares of apparent bronchitis, purulent cough. He had a sinus CT that was negative for sinusitis on 07/09/14, there was a mucosal retention cyst versus polyp in the right maxillary sinus and a small amount of fluid in the right ethmoid air cell. He has only had relief when on prednisone. He has throat wheezing, dry cough. Not currently on fluticasone, not sure if he is taking the zyrtec. He has breakthrough GERD and also has a significant dust exposure at home.   ROV 07/28/14 -- COPD and asthma, mild AFL, frequent flares of apparent bronchitis, purulent cough. Last time we restarted GERD and allergy regimens, started pred 20. He still has cough but is much better. He is back on nexium, is on zyrtec. Doesn't have fluticasone yet - needs to change to nasonex for formulary.   ROV 08/10/14 -- COPD and asthma, mild AFL, frequent flares of apparent bronchitis, purulent cough. He is on Pred  right now. He was temporarily better when we treated with levaquin - now back at his baseline > lot of cough, mucous, ear  fullness.   09/07/2014 ROB -COPD and Asthma  Returns for 1 month follow up  Reports breathing is improved since beginning the Xolair.   Does report some hoarseness and barking cough today   Felt so much better after first Xolair injection  Remains on Symbicort Twice daily  .  Currently on prednisone 20mg  daily  Denies chest pain, orthopnea, edema or n/v/, rash or angioedema.  Got second shot of Xolair this am.     Review of Systems Constitutional:   No  weight loss, night sweats,  Fevers, chills,  +fatigue, or  lassitude.  HEENT:   No headaches,  Difficulty swallowing,  Tooth/dental problems, or  Sore throat,                 No sneezing, itching, ear ache, nasal congestion, post nasal drip,   CV:  No chest pain,  Orthopnea, PND, swelling in lower extremities, anasarca, dizziness, palpitations, syncope.   GI  No heartburn, indigestion, abdominal pain, nausea, vomiting, diarrhea, change in bowel habits, loss of appetite, bloody stools.   Resp:    No chest wall deformity  Skin: no rash or lesions.  GU: no dysuria, change in color of urine, no urgency or frequency.  No flank pain, no hematuria   MS:  No joint pain or swelling.  No decreased range of motion.  No back pain.  Psych:  No change in mood or affect. No depression or anxiety.  No memory loss.         Objective:   Physical Exam GEN: A/Ox3; pleasant , NAD   HEENT:  [place]/AT,  EACs-clear, TMs-wnl, NOSE-clear, THROAT-clear, no lesions, no postnasal drip or exudate noted.   NECK:  Supple w/ fair ROM; no JVD; normal carotid impulses w/o bruits; no thyromegaly or nodules palpated; no lymphadenopathy.  RESP  Clear  P & A; w/o, wheezes/ rales/ or rhonchi.no accessory muscle use, no dullness to percussion  CARD:  RRR, no m/r/g  , no peripheral edema, pulses intact, no cyanosis or clubbing.  GI:   Soft & nt; nml bowel sounds; no organomegaly or masses detected.  Musco: Warm bil, no deformities or joint swelling noted.   Neuro: alert, no focal deficits noted.    Skin: Warm, no lesions or rashes         Assessment & Plan:

## 2014-09-07 NOTE — Progress Notes (Signed)
Subjective:    Patient ID: Martin Singh, male    DOB: 1966/05/29, 49 y.o.   MRN: [id number]  Chief Complaint  Patient presents with  . CPE    had a seizure this morning and has been having them alot more frequent, freezes up and passes out, sometimes hallucinates    HPI:  Martin Raddleldon V Anstey is a 49 y.o. male who presents today for an annual wellness visit.   1) Health Maintenance - Overall feeling okay, describes being tired.  Diet - eating 2-3 meals per day; consists of vegetables and fruits, meats; does not eat a lot of fried foods; "eats healthy foods."  Wt Readings from Last 3 Encounters:  09/07/14 180 lb 1.9 oz (81.702 kg)  09/02/14 181 lb 4.8 oz (82.237 kg)  08/24/14 178 lb (80.74 kg)    Exercise - Does not exercise currently secondary to his "spells"   2) Preventative Exams / Immunizations:  Dental -- Up to date Vision -- Due for an exam.    Health Maintenance  Topic Date Due  . PNEUMOCOCCAL POLYSACCHARIDE VACCINE (1) 04/15/1967  . FOOT EXAM  04/15/1975  . OPHTHALMOLOGY EXAM  04/15/1975  . URINE MICROALBUMIN  04/15/1975  . HEMOGLOBIN A1C  10/03/2014  . INFLUENZA VACCINE  03/28/2015  . TETANUS/TDAP  06/29/2020  PPSV 23   Immunization History  Administered Date(s) Administered  . Influenza Split 08/16/2011  . Influenza,inj,Quad PF,36+ Mos 06/05/2013, 05/28/2014  . Pneumococcal Conjugate-13 06/29/2010  . Tdap 06/29/2010   Allergies  Allergen Reactions  . Codeine Anaphylaxis  . Benazepril Cough  . Ativan [Lorazepam] Other (See Comments)    Becomes violent and hallucinates  . Corticosteroids Other (See Comments)    Shaky and high blood pressure    Current Outpatient Prescriptions on File Prior to Visit  Medication Sig Dispense Refill  . albuterol (PROVENTIL HFA;VENTOLIN HFA) 108 (90 BASE) MCG/ACT inhaler Inhale 2 puffs into the lungs every 6 (six) hours as needed for wheezing or shortness of breath. 3 Inhaler 3  . aspirin 81 MG chewable tablet Chew 81  mg by mouth daily.    Marland Kitchen. atenolol (TENORMIN) 100 MG tablet Take 1 tablet (100 mg total) by mouth daily. 30 tablet 2  . budesonide-formoterol (SYMBICORT) 160-4.5 MCG/ACT inhaler Inhale 2 puffs into the lungs 2 (two) times daily. 3 Inhaler 3  . carbamazepine (TEGRETOL XR) 200 MG 12 hr tablet Take 2 tablets (400 mg total) by mouth 2 (two) times daily. 360 tablet 1  . cetirizine (ZYRTEC) 10 MG tablet Take 1 tablet (10 mg total) by mouth daily. (Patient taking differently: Take 10 mg by mouth daily as needed for allergies. ) 30 tablet 6  . cloNIDine (CATAPRES) 0.1 MG tablet Take 0.1 mg by mouth 2 (two) times daily.    Marland Kitchen. esomeprazole (NEXIUM) 40 MG capsule Take 1 capsule (40 mg total) by mouth daily at 12 noon. 60 capsule 2  . fesoterodine (TOVIAZ) 4 MG TB24 tablet Take 4 mg by mouth daily.    Marland Kitchen. FLUoxetine (PROZAC) 20 MG capsule Take 3 capsules (60 mg total) by mouth at bedtime. 180 capsule 0  . gabapentin (NEURONTIN) 300 MG capsule Take 1 capsule (300 mg total) by mouth 2 (two) times daily. 60 capsule 0  . hydrochlorothiazide (HYDRODIURIL) 25 MG tablet Take 1 tablet (25 mg total) by mouth daily. 30 tablet 0  . ibuprofen (ADVIL,MOTRIN) 200 MG tablet Take 400 mg by mouth every 6 (six) hours as needed for moderate pain.    .Marland Kitchen  ipratropium (ATROVENT) 0.03 % nasal spray Two sprays each nostril 2-3 times daily prn. 30 mL 5  . methocarbamol (ROBAXIN) 500 MG tablet Take 1 tablet (500 mg total) by mouth 2 (two) times daily. 20 tablet 0  . mometasone (NASONEX) 50 MCG/ACT nasal spray Place 2 sprays into the nose daily. 17 g 11  . Multiple Vitamin (MULTIVITAMIN) tablet Take 1 tablet by mouth daily.    Marland Kitchen nystatin cream (MYCOSTATIN) Apply 1 application topically 2 (two) times daily.    Marland Kitchen omalizumab (XOLAIR) 150 MG injection Inject 300 mg into the skin every 28 (twenty-eight) days.    . predniSONE (DELTASONE) 20 MG tablet Take 1 tablet (20 mg total) by mouth daily with breakfast. 30 tablet 1  . senna-docusate (SENNA-S)  8.6-50 MG per tablet Take 1 tablet by mouth at bedtime as needed for mild constipation.     . topiramate (TOPAMAX) 25 MG tablet Take 1 tablet (25 mg total) by mouth daily. 120 tablet 3   No current facility-administered medications on file prior to visit.    Past Medical History  Diagnosis Date  . Hypertension   . Anxiety and depression   . Osteoarthrosis, unspecified whether generalized or localized, unspecified site   . Irritable bowel syndrome   . GERD (gastroesophageal reflux disease)     h/o esophagitis  . Allergic rhinitis   . Insomnia   . Diverticulosis   . Alcohol abuse 2012  . Polysubstance abuse   . Seizures   . Alcoholic pancreatitis   . Periumbilical hernia 12/13/12  . Fatty liver 10/08/11  . Internal hemorrhoids 03/13/11  . Hiatal hernia 03/13/11  . Asthmatic bronchitis   . Frequent headaches   . 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 Prediabetes 03/01/2014    Past Surgical History  Procedure Laterality Date  . Tibula surgery Right 2002    Family History  Problem Relation Age of Onset  . Heart disease Mother   . Gallbladder disease Mother   . Nephrolithiasis Mother   . Arthritis Mother   . Hyperlipidemia Mother   . Stroke Mother   . Irritable bowel syndrome Mother   . Cancer Mother     vulva  . Colon cancer Neg Hx   . Hypertension Father     moth  . Heart disease Father   . Arthritis Father   . Irritable bowel syndrome Brother     x 2   History   Social History  . Marital Status: Divorced    Spouse Name: N/A    Number of Children: 1  . Years of Education: N/A   Occupational History  . unemployed-- apllying for disability    Social History Main Topics  . Smoking status: Former Smoker -- 1.50 packs/day for 18 years    Types: Cigarettes    Quit date: 08/28/1991  . Smokeless tobacco: Current User    Types: Snuff     Comment: currently uses snuff.11/17/13  . Alcohol Use: No     Comment: no alcohol in one yr; drank 35 years alcohol, quit 02/2012    . Drug Use: No     Comment: (denies current use) benzos- ; hx of THC, cocaine, uppers/downers  . Sexual Activity:    Partners: Female     Comment: no birth control   Other Topics Concern  . Not on file   Social History Narrative   Lives w/ fiancee     Review of Systems  Constitutional: Denies fever, chills, fatigue, or significant weight gain/loss.  HENT: Head: Denies headache or neck pain Ears: Denies changes in hearing, ringing in ears, earache, drainage Nose: Denies discharge, stuffiness, itching, nosebleed, sinus pain Throat: Denies sore throat, hoarseness, dry mouth, sores, thrush Eyes: Denies loss/changes in vision, pain, redness, blurry/double vision, flashing lights Cardiovascular: Denies chest pain/discomfort, tightness, palpitations, shortness of breath with activity, difficulty lying down, swelling, sudden awakening with shortness of breath Respiratory: Denies shortness of breath, cough, sputum production, wheezing Gastrointestinal: Denies dysphasia, heartburn, change in appetite, nausea, change in bowel habits, rectal bleeding, constipation, diarrhea, yellow skin or eyes Genitourinary: Denies frequency, urgency, burning/pain, blood in urine, incontinence, change in urinary strength. Musculoskeletal: Denies muscle/joint pain, stiffness, back pain, redness or swelling of joints, trauma Skin: Denies rashes, lumps, itching, dryness, color changes, or hair/nail changes Neurological: Denies seizures, weakness, numbness, tingling, tremor Positive for fainting and dizzines believed to be psychogenic in nature.  Psychiatric - Denies nervousness, stress, depression or memory loss Endocrine: Denies heat or cold intolerance, sweating, frequent urination, excessive thirst, changes in appetite Hematologic: Denies ease of bruising or bleeding     Objective:    BP 160/98 mmHg  Pulse 82  Temp(Src) 98.2 F (36.8 C) (Oral)  Resp 18  Wt 180 lb 1.9 oz (81.702 kg)  SpO2 96% Nursing  note and vital signs reviewed.  Physical Exam  Constitutional: He is oriented to person, place, and time. He appears well-developed and well-nourished.  HENT:  Head: Normocephalic.  Right Ear: Hearing, tympanic membrane, external ear and ear canal normal.  Left Ear: Hearing, tympanic membrane, external ear and ear canal normal.  Nose: Nose normal.  Mouth/Throat: Uvula is midline, oropharynx is clear and moist and mucous membranes are normal.  Eyes: Conjunctivae and EOM are normal. Pupils are equal, round, and reactive to light.  Neck: Neck supple. No JVD present. No tracheal deviation present. No thyromegaly present.  Cardiovascular: Normal rate, regular rhythm, normal heart sounds and intact distal pulses.   Pulmonary/Chest: Effort normal and breath sounds normal.  Abdominal: Soft. Bowel sounds are normal. He exhibits no distension and no mass. There is no tenderness. There is no rebound and no guarding.  Musculoskeletal: Normal range of motion. He exhibits no edema or tenderness.  Lymphadenopathy:    He has no cervical adenopathy.  Neurological: He is alert and oriented to person, place, and time. He has normal reflexes. No cranial nerve deficit. He exhibits normal muscle tone. Coordination normal.  Skin: Skin is warm and dry.  Psychiatric: He has a normal mood and affect. His behavior is normal. Judgment and thought content normal.       Assessment & Plan:

## 2014-09-07 NOTE — Patient Instructions (Signed)
Thank you for choosing [facility].  Summary/Instructions:  Your prescription(s) have been submitted to your pharmacy or been printed and provided for you. Please take as directed and contact our office if you believe you are having problem(s) with the medication(s) or have any questions.  Please continue to take your medications as prescribed.  Health Maintenance A healthy lifestyle and preventative care can promote health and wellness.  Maintain regular health, dental, and eye exams.  Eat a healthy diet. Foods like vegetables, fruits, whole grains, low-fat dairy products, and lean protein foods contain the nutrients you need and are low in calories. Decrease your intake of foods high in solid fats, added sugars, and salt. Get information about a proper diet from your health care provider, if necessary.  Regular physical exercise is one of the most important things you can do for your health. Most adults should get at least 150 minutes of moderate-intensity exercise (any activity that increases your heart rate and causes you to sweat) each week. In addition, most adults need muscle-strengthening exercises on 2 or more days a week.   Maintain a healthy weight. The body mass index (BMI) is a screening tool to identify possible weight problems. It provides an estimate of body fat based on height and weight. Your health care provider can find your BMI and can help you achieve or maintain a healthy weight. For males 20 years and older:  A BMI below 18.5 is considered underweight.  A BMI of 18.5 to 24.9 is normal.  A BMI of 25 to 29.9 is considered overweight.  A BMI of 30 and above is considered obese.  Maintain normal blood lipids and cholesterol by exercising and minimizing your intake of saturated fat. Eat a balanced diet with plenty of fruits and vegetables. Blood tests for lipids and cholesterol should begin at age 64 and be repeated every 5 years. If your lipid or cholesterol  levels are high, you are over age 52, or you are at high risk for heart disease, you may need your cholesterol levels checked more frequently.Ongoing high lipid and cholesterol levels should be treated with medicines if diet and exercise are not working.  If you smoke, find out from your health care provider how to quit. If you do not use tobacco, do not start.  Lung cancer screening is recommended for adults aged 55-80 years who are at high risk for developing lung cancer because of a history of smoking. A yearly low-dose CT scan of the lungs is recommended for people who have at least a 30-pack-year history of smoking and are current smokers or have quit within the past 15 years. A pack year of smoking is smoking an average of 1 pack of cigarettes a day for 1 year (for example, a 30-pack-year history of smoking could mean smoking 1 pack a day for 30 years or 2 packs a day for 15 years). Yearly screening should continue until the smoker has stopped smoking for at least 15 years. Yearly screening should be stopped for people who develop a health problem that would prevent them from having lung cancer treatment.  If you choose to drink alcohol, do not have more than 2 drinks per day. One drink is considered to be 12 oz (360 mL) of beer, 5 oz (150 mL) of wine, or 1.5 oz (45 mL) of liquor.  Avoid the use of street drugs. Do not share needles with anyone. Ask for help if you need support or instructions about stopping the  use of drugs.  High blood pressure causes heart disease and increases the risk of stroke. Blood pressure should be checked at least every 1-2 years. Ongoing high blood pressure should be treated with medicines if weight loss and exercise are not effective.  If you are 22-75 years old, ask your health care provider if you should take aspirin to prevent heart disease.  Diabetes screening involves taking a blood sample to check your fasting blood sugar level. This should be done once every  3 years after age 39 if you are at a normal weight and without risk factors for diabetes. Testing should be considered at a younger age or be carried out more frequently if you are overweight and have at least 1 risk factor for diabetes.  Colorectal cancer can be detected and often prevented. Most routine colorectal cancer screening begins at the age of 57 and continues through age 61. However, your health care provider may recommend screening at an earlier age if you have risk factors for colon cancer. On a yearly basis, your health care provider may provide home test kits to check for hidden blood in the stool. A small camera at the end of a tube may be used to directly examine the colon (sigmoidoscopy or colonoscopy) to detect the earliest forms of colorectal cancer. Talk to your health care provider about this at age 24 when routine screening begins. A direct exam of the colon should be repeated every 5-10 years through age 57, unless early forms of precancerous polyps or small growths are found.  People who are at an increased risk for hepatitis B should be screened for this virus. You are considered at high risk for hepatitis B if:  You were born in a country where hepatitis B occurs often. Talk with your health care provider about which countries are considered high risk.  Your parents were born in a high-risk country and you have not received a shot to protect against hepatitis B (hepatitis B vaccine).  You have HIV or AIDS.  You use needles to inject street drugs.  You live with, or have sex with, someone who has hepatitis B.  You are a man who has sex with other men (MSM).  You get hemodialysis treatment.  You take certain medicines for conditions like cancer, organ transplantation, and autoimmune conditions.  Hepatitis C blood testing is recommended for all people born from [birth year] through 1966 and any individual with known risk factors for hepatitis C.  Healthy men should no longer  receive prostate-specific antigen (PSA) blood tests as part of routine cancer screening. Talk to your health care provider about prostate cancer screening.  Testicular cancer screening is not recommended for adolescents or adult males who have no symptoms. Screening includes self-exam, a health care provider exam, and other screening tests. Consult with your health care provider about any symptoms you have or any concerns you have about testicular cancer.  Practice safe sex. Use condoms and avoid high-risk sexual practices to reduce the spread of sexually transmitted infections (STIs).  You should be screened for STIs, including gonorrhea and chlamydia if:  You are sexually active and are younger than 24 years.  You are older than 24 years, and your health care provider tells you that you are at risk for this type of infection.  Your sexual activity has changed since you were last screened, and you are at an increased risk for chlamydia or gonorrhea. Ask your health care provider if you are at risk.  If you are at risk of being infected with HIV, it is recommended that you take a prescription medicine daily to prevent HIV infection. This is called pre-exposure prophylaxis (PrEP). You are considered at risk if:  You are a man who has sex with other men (MSM).  You are a heterosexual man who is sexually active with multiple partners.  You take drugs by injection.  You are sexually active with a partner who has HIV.  Talk with your health care provider about whether you are at high risk of being infected with HIV. If you choose to begin PrEP, you should first be tested for HIV. You should then be tested every 3 months for as long as you are taking PrEP.  Use sunscreen. Apply sunscreen liberally and repeatedly throughout the day. You should seek shade when your shadow is shorter than you. Protect yourself by wearing long sleeves, pants, a wide-brimmed hat, and sunglasses year round whenever you  are outdoors.  Tell your health care provider of new moles or changes in moles, especially if there is a change in shape or color. Also, tell your health care provider if a mole is larger than the size of a pencil eraser.  A one-time screening for abdominal aortic aneurysm (AAA) and surgical repair of large AAAs by ultrasound is recommended for men aged 65-75 years who are current or former smokers.  Stay current with your vaccines (immunizations). Document Released: 02/09/2008 Document Revised: 08/18/2013 Document Reviewed: 01/08/2011 [facility] Patient Information 2015 [place], [place]. This information is not intended to replace advice given to you by your health care provider. Make sure you discuss any questions you have with your health care provider.

## 2014-09-07 NOTE — Addendum Note (Signed)
Addended by: Boone MasterJONES, JESSICA E on: 09/07/2014 12:10 PM   Modules accepted: Orders

## 2014-09-07 NOTE — Assessment & Plan Note (Signed)
1) Anticipatory Guidance: Discussed importance of wearing a seatbelt while driving and not texting while driving; changing batteries in smoke detector at least once annually; wearing suntan lotion when outside; eating a balanced and moderate diet; getting physical activity at least 30 minutes per day.  2) Immunizations / Screenings / Labs:  Patient given pneumococcal 23 today. All other immunizations are up-to-date per recommendations. Patient is due for a vision exam. All other screenings are up-to-date per recommendations. No blood work is needed at this time from previous hospitalization. All blood work reviewed and results were within normal expected ranges. Patient did have some microcytosis noted on his CBC. Start iron supplement.  Overall well exam. Neurological exam within normal limits, with no evidence for indications as to dizziness or seizures. Reviewed medications with patient. Discontinue hydroxyzine. Discontinue Flexeril. Start clonazepam 0.5 mg as needed for anxiety. Discussed risk of falls and ways to reduce falls. Discussed risk factors of hypertension with patient. Blood pressure remains labile this time, continue current medications as prescribed; elevation today most likely related to increased anxiety. Follow up prevention exam in one year. Follow up on blood pressure and anxiety in 3 months.

## 2014-09-09 ENCOUNTER — Ambulatory Visit: Payer: Self-pay | Admitting: Emergency Medicine

## 2014-09-09 MED ORDER — OMALIZUMAB 150 MG ~~LOC~~ SOLR
300.0000 mg | Freq: Once | SUBCUTANEOUS | Status: AC
  Administered 2014-09-07: 300 mg via SUBCUTANEOUS

## 2014-09-14 ENCOUNTER — Encounter (HOSPITAL_COMMUNITY): Payer: Self-pay | Admitting: *Deleted

## 2014-09-14 ENCOUNTER — Emergency Department (HOSPITAL_COMMUNITY)
Admission: EM | Admit: 2014-09-14 | Discharge: 2014-09-15 | Disposition: A | Discharge status: Other Acute Inpatient Hospital | Payer: No Typology Code available for payment source | Attending: Emergency Medicine | Admitting: Emergency Medicine

## 2014-09-14 DIAGNOSIS — R55 Syncope and collapse: Secondary | ICD-10-CM | POA: Insufficient documentation

## 2014-09-14 DIAGNOSIS — I10 Essential (primary) hypertension: Secondary | ICD-10-CM | POA: Insufficient documentation

## 2014-09-14 DIAGNOSIS — G40909 Epilepsy, unspecified, not intractable, without status epilepticus: Secondary | ICD-10-CM | POA: Insufficient documentation

## 2014-09-14 DIAGNOSIS — Z7952 Long term (current) use of systemic steroids: Secondary | ICD-10-CM | POA: Insufficient documentation

## 2014-09-14 DIAGNOSIS — J45909 Unspecified asthma, uncomplicated: Secondary | ICD-10-CM | POA: Insufficient documentation

## 2014-09-14 DIAGNOSIS — Z79899 Other long term (current) drug therapy: Secondary | ICD-10-CM | POA: Insufficient documentation

## 2014-09-14 DIAGNOSIS — Z7982 Long term (current) use of aspirin: Secondary | ICD-10-CM | POA: Insufficient documentation

## 2014-09-14 DIAGNOSIS — R45851 Suicidal ideations: Secondary | ICD-10-CM

## 2014-09-14 DIAGNOSIS — R51 Headache: Secondary | ICD-10-CM | POA: Insufficient documentation

## 2014-09-14 DIAGNOSIS — M1990 Unspecified osteoarthritis, unspecified site: Secondary | ICD-10-CM | POA: Insufficient documentation

## 2014-09-14 DIAGNOSIS — K219 Gastro-esophageal reflux disease without esophagitis: Secondary | ICD-10-CM | POA: Insufficient documentation

## 2014-09-14 DIAGNOSIS — Z87891 Personal history of nicotine dependence: Secondary | ICD-10-CM | POA: Insufficient documentation

## 2014-09-14 DIAGNOSIS — T1491XA Suicide attempt, initial encounter: Secondary | ICD-10-CM

## 2014-09-14 HISTORY — DX: Major depressive disorder, single episode, unspecified: F32.9

## 2014-09-14 HISTORY — DX: Depression, unspecified: F32.A

## 2014-09-14 LAB — COMPREHENSIVE METABOLIC PANEL
ALBUMIN: 4.5 g/dL (ref 3.5–5.2)
ALK PHOS: 85 U/L (ref 39–117)
ALT: 53 U/L (ref 0–53)
AST: 66 U/L — ABNORMAL HIGH (ref 0–37)
Anion gap: 11 (ref 5–15)
BUN: 16 mg/dL (ref 6–23)
CALCIUM: 9.8 mg/dL (ref 8.4–10.5)
CHLORIDE: 103 meq/L (ref 96–112)
CO2: 28 mmol/L (ref 19–32)
CREATININE: 1.02 mg/dL (ref 0.50–1.35)
GFR calc non Af Amer: 85 mL/min — ABNORMAL LOW (ref >90)
GLUCOSE: 105 mg/dL — AB (ref 70–99)
POTASSIUM: 3.2 mmol/L — AB (ref 3.5–5.1)
SODIUM: 142 mmol/L (ref 135–145)
Total Bilirubin: 0.6 mg/dL (ref 0.3–1.2)
Total Protein: 8.1 g/dL (ref 6.0–8.3)

## 2014-09-14 LAB — RAPID URINE DRUG SCREEN, HOSP PERFORMED
AMPHETAMINES: NOT DETECTED
BENZODIAZEPINES: NOT DETECTED
Barbiturates: NOT DETECTED
Cocaine: NOT DETECTED
Opiates: POSITIVE — AB
TETRAHYDROCANNABINOL: NOT DETECTED

## 2014-09-14 LAB — CBC
HCT: 40.8 % (ref 39.0–52.0)
Hemoglobin: 13 g/dL (ref 13.0–17.0)
MCH: 28.4 pg (ref 26.0–34.0)
MCHC: 31.9 g/dL (ref 30.0–36.0)
MCV: 89.1 fL (ref 78.0–100.0)
Platelets: 289 10*3/uL (ref 150–400)
RBC: 4.58 MIL/uL (ref 4.22–5.81)
RDW: 13.5 % (ref 11.5–15.5)
WBC: 9.4 10*3/uL (ref 4.0–10.5)

## 2014-09-14 LAB — ETHANOL: Alcohol, Ethyl (B): <5 mg/dL (ref 0–9)

## 2014-09-14 LAB — CARBAMAZEPINE LEVEL, TOTAL: Carbamazepine Lvl: 5.9 ug/mL (ref 4.0–12.0)

## 2014-09-14 LAB — I-STAT TROPONIN, ED: TROPONIN I, POC: 0 ng/mL (ref 0.00–0.08)

## 2014-09-14 LAB — ACETAMINOPHEN LEVEL

## 2014-09-14 LAB — SALICYLATE LEVEL: Salicylate Lvl: <4 mg/dL (ref 2.8–20.0)

## 2014-09-14 MED ORDER — FESOTERODINE FUMARATE ER 4 MG PO TB24
4.0000 mg | ORAL_TABLET | Freq: Every day | ORAL | Status: DC
  Administered 2014-09-14: 4 mg via ORAL
  Filled 2014-09-14 (×2): qty 1

## 2014-09-14 MED ORDER — ASPIRIN 81 MG PO CHEW
81.0000 mg | CHEWABLE_TABLET | Freq: Every day | ORAL | Status: DC
  Administered 2014-09-14: 81 mg via ORAL
  Filled 2014-09-14: qty 1

## 2014-09-14 MED ORDER — CLONIDINE HCL 0.1 MG PO TABS
0.1000 mg | ORAL_TABLET | Freq: Two times a day (BID) | ORAL | Status: DC
  Administered 2014-09-14: 0.1 mg via ORAL
  Filled 2014-09-14: qty 1

## 2014-09-14 MED ORDER — CLONAZEPAM 0.5 MG PO TABS: 0.5000 mg | ORAL_TABLET | Freq: Two times a day (BID) | ORAL | Status: DC | PRN

## 2014-09-14 MED ORDER — PANTOPRAZOLE SODIUM 40 MG PO TBEC
40.0000 mg | DELAYED_RELEASE_TABLET | Freq: Every day | ORAL | Status: DC
  Administered 2014-09-14: 40 mg via ORAL
  Filled 2014-09-14: qty 1

## 2014-09-14 MED ORDER — ATENOLOL 100 MG PO TABS: 100.0000 mg | ORAL_TABLET | Freq: Every day | ORAL | Status: DC

## 2014-09-14 MED ORDER — GABAPENTIN 300 MG PO CAPS
300.0000 mg | ORAL_CAPSULE | Freq: Two times a day (BID) | ORAL | Status: DC
  Administered 2014-09-14: 300 mg via ORAL
  Filled 2014-09-14: qty 1

## 2014-09-14 MED ORDER — IBUPROFEN 200 MG PO TABS: 600.0000 mg | ORAL_TABLET | Freq: Three times a day (TID) | ORAL | Status: DC | PRN

## 2014-09-14 MED ORDER — CARBAMAZEPINE ER 400 MG PO TB12
400.0000 mg | ORAL_TABLET | Freq: Two times a day (BID) | ORAL | Status: DC
  Administered 2014-09-14: 400 mg via ORAL
  Filled 2014-09-14 (×2): qty 1

## 2014-09-14 MED ORDER — TOPIRAMATE 25 MG PO TABS
25.0000 mg | ORAL_TABLET | Freq: Every day | ORAL | Status: DC
  Administered 2014-09-14: 25 mg via ORAL
  Filled 2014-09-14: qty 1

## 2014-09-14 MED ORDER — ACETAMINOPHEN 325 MG PO TABS: 650.0000 mg | ORAL_TABLET | ORAL | Status: DC | PRN

## 2014-09-14 MED ORDER — ADULT MULTIVITAMIN W/MINERALS CH: 1.0000 | ORAL_TABLET | Freq: Every day | ORAL | Status: DC

## 2014-09-14 MED ORDER — BUDESONIDE-FORMOTEROL FUMARATE 160-4.5 MCG/ACT IN AERO
2.0000 | INHALATION_SPRAY | Freq: Two times a day (BID) | RESPIRATORY_TRACT | Status: DC
  Filled 2014-09-14: qty 6

## 2014-09-14 MED ORDER — ALBUTEROL SULFATE HFA 108 (90 BASE) MCG/ACT IN AERS: 2.0000 | INHALATION_SPRAY | Freq: Four times a day (QID) | RESPIRATORY_TRACT | Status: DC | PRN

## 2014-09-14 MED ORDER — FLUOXETINE HCL 20 MG PO CAPS
60.0000 mg | ORAL_CAPSULE | Freq: Every day | ORAL | Status: DC
  Administered 2014-09-14: 60 mg via ORAL
  Filled 2014-09-14: qty 3

## 2014-09-14 MED ORDER — HYDROCHLOROTHIAZIDE 25 MG PO TABS: 25.0000 mg | ORAL_TABLET | Freq: Every day | ORAL | Status: DC

## 2014-09-14 NOTE — ED Notes (Signed)
1 pt belonging bag in locker #33 2 shirt, 1 shorts, 1 pants, 1 pair socks, 1 pair shoes, 1 coat, 1 can of dip

## 2014-09-14 NOTE — ED Notes (Signed)
Pt alert and oriented x4. Respirations even and unlabored, bilateral symmetrical rise and fall of chest. Skin warm and dry. In no acute distress. Denies needs.   

## 2014-09-14 NOTE — ED Notes (Addendum)
Patient denies SI, HI at present. Reports passive SI over the last few weeks and AH. Rates anxiety 6/10 and feelings of depression 6/10. States sleep is poor with difficulty falling asleep. Denies any change or problems with appetite. Patient states that he does not have a drug or alcohol problem. States that he was huffing because he was suicidal. Patient is calm, cooperative.  Encouragement offered. Oriented to unit.  Q 15 safety checks in place.

## 2014-09-14 NOTE — ED Notes (Signed)
Bed: WA13 Expected date:  Expected time:  Means of arrival:  Comments: ems  
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
## 2014-09-14 NOTE — [facility] Counselor (Addendum)
[facility] by Donell SievertSpencer Simon, PA; 301-2. Dr Elna BreslowEappen, attending.  Pt can be transported after 1130pm

## 2014-09-15 ENCOUNTER — Inpatient Hospital Stay (HOSPITAL_COMMUNITY)
Admission: EM | Admit: 2014-09-15 | Discharge: 2014-09-22 | DRG: 885 | Disposition: A | Discharge status: Rehabilitation Facility | Payer: Federal, State, Local not specified - Other | Source: Intra-hospital | Attending: Psychiatry | Admitting: Psychiatry

## 2014-09-15 ENCOUNTER — Encounter (HOSPITAL_COMMUNITY): Payer: Self-pay

## 2014-09-15 DIAGNOSIS — K219 Gastro-esophageal reflux disease without esophagitis: Secondary | ICD-10-CM | POA: Diagnosis present

## 2014-09-15 DIAGNOSIS — N189 Chronic kidney disease, unspecified: Secondary | ICD-10-CM | POA: Diagnosis present

## 2014-09-15 DIAGNOSIS — F332 Major depressive disorder, recurrent severe without psychotic features: Principal | ICD-10-CM | POA: Diagnosis present

## 2014-09-15 DIAGNOSIS — K589 Irritable bowel syndrome without diarrhea: Secondary | ICD-10-CM | POA: Diagnosis present

## 2014-09-15 DIAGNOSIS — M1990 Unspecified osteoarthritis, unspecified site: Secondary | ICD-10-CM | POA: Diagnosis present

## 2014-09-15 DIAGNOSIS — Z8249 Family history of ischemic heart disease and other diseases of the circulatory system: Secondary | ICD-10-CM | POA: Diagnosis not present

## 2014-09-15 DIAGNOSIS — F322 Major depressive disorder, single episode, severe without psychotic features: Secondary | ICD-10-CM

## 2014-09-15 DIAGNOSIS — E781 Pure hyperglyceridemia: Secondary | ICD-10-CM | POA: Diagnosis present

## 2014-09-15 DIAGNOSIS — K760 Fatty (change of) liver, not elsewhere classified: Secondary | ICD-10-CM | POA: Diagnosis present

## 2014-09-15 DIAGNOSIS — F419 Anxiety disorder, unspecified: Secondary | ICD-10-CM | POA: Diagnosis present

## 2014-09-15 DIAGNOSIS — Z823 Family history of stroke: Secondary | ICD-10-CM | POA: Diagnosis not present

## 2014-09-15 DIAGNOSIS — I129 Hypertensive chronic kidney disease with stage 1 through stage 4 chronic kidney disease, or unspecified chronic kidney disease: Secondary | ICD-10-CM | POA: Diagnosis present

## 2014-09-15 DIAGNOSIS — F17220 Nicotine dependence, chewing tobacco, uncomplicated: Secondary | ICD-10-CM | POA: Diagnosis present

## 2014-09-15 DIAGNOSIS — J45909 Unspecified asthma, uncomplicated: Secondary | ICD-10-CM | POA: Diagnosis present

## 2014-09-15 DIAGNOSIS — F1920 Other psychoactive substance dependence, uncomplicated: Secondary | ICD-10-CM | POA: Diagnosis present

## 2014-09-15 DIAGNOSIS — F1120 Opioid dependence, uncomplicated: Secondary | ICD-10-CM | POA: Diagnosis present

## 2014-09-15 DIAGNOSIS — E785 Hyperlipidemia, unspecified: Secondary | ICD-10-CM | POA: Diagnosis present

## 2014-09-15 DIAGNOSIS — G4700 Insomnia, unspecified: Secondary | ICD-10-CM | POA: Diagnosis present

## 2014-09-15 DIAGNOSIS — L299 Pruritus, unspecified: Secondary | ICD-10-CM | POA: Diagnosis present

## 2014-09-15 MED ORDER — ATENOLOL 100 MG PO TABS
100.0000 mg | ORAL_TABLET | Freq: Every day | ORAL | Status: DC
  Administered 2014-09-15 – 2014-09-22 (×8): 100 mg via ORAL
  Filled 2014-09-15: qty 4
  Filled 2014-09-15 (×8): qty 1
  Filled 2014-09-15: qty 28
  Filled 2014-09-15: qty 1

## 2014-09-15 MED ORDER — CARBAMAZEPINE ER 200 MG PO TB12
400.0000 mg | ORAL_TABLET | Freq: Two times a day (BID) | ORAL | Status: DC
  Administered 2014-09-15 – 2014-09-22 (×15): 400 mg via ORAL
  Filled 2014-09-15: qty 1
  Filled 2014-09-15: qty 56
  Filled 2014-09-15 (×12): qty 1
  Filled 2014-09-15: qty 2
  Filled 2014-09-15 (×2): qty 1
  Filled 2014-09-15: qty 56
  Filled 2014-09-15 (×2): qty 1

## 2014-09-15 MED ORDER — TOPIRAMATE 25 MG PO TABS
25.0000 mg | ORAL_TABLET | Freq: Every day | ORAL | Status: DC
  Administered 2014-09-15 – 2014-09-22 (×7): 25 mg via ORAL
  Filled 2014-09-15: qty 14
  Filled 2014-09-15 (×10): qty 1

## 2014-09-15 MED ORDER — POTASSIUM CHLORIDE CRYS ER 20 MEQ PO TBCR
20.0000 meq | EXTENDED_RELEASE_TABLET | Freq: Two times a day (BID) | ORAL | Status: AC
  Administered 2014-09-15 – 2014-09-16 (×4): 20 meq via ORAL
  Filled 2014-09-15 (×5): qty 1

## 2014-09-15 MED ORDER — FESOTERODINE FUMARATE ER 4 MG PO TB24
4.0000 mg | ORAL_TABLET | Freq: Every day | ORAL | Status: DC
  Administered 2014-09-16 – 2014-09-22 (×7): 4 mg via ORAL
  Filled 2014-09-15 (×2): qty 1
  Filled 2014-09-15: qty 14
  Filled 2014-09-15 (×7): qty 1

## 2014-09-15 MED ORDER — POTASSIUM CHLORIDE CRYS ER 20 MEQ PO TBCR
20.0000 meq | EXTENDED_RELEASE_TABLET | Freq: Once | ORAL | Status: AC
  Administered 2014-09-15: 20 meq via ORAL
  Filled 2014-09-15: qty 1

## 2014-09-15 MED ORDER — GABAPENTIN 300 MG PO CAPS
300.0000 mg | ORAL_CAPSULE | Freq: Two times a day (BID) | ORAL | Status: DC
  Administered 2014-09-15: 300 mg via ORAL
  Filled 2014-09-15 (×2): qty 1

## 2014-09-15 MED ORDER — MAGNESIUM HYDROXIDE 400 MG/5ML PO SUSP
30.0000 mL | Freq: Every day | ORAL | Status: DC | PRN
  Administered 2014-09-18: 30 mL via ORAL
  Filled 2014-09-15: qty 30

## 2014-09-15 MED ORDER — ALBUTEROL SULFATE HFA 108 (90 BASE) MCG/ACT IN AERS
2.0000 | INHALATION_SPRAY | Freq: Four times a day (QID) | RESPIRATORY_TRACT | Status: DC | PRN
  Administered 2014-09-16 – 2014-09-21 (×3): 2 via RESPIRATORY_TRACT
  Filled 2014-09-15: qty 6.7

## 2014-09-15 MED ORDER — NICOTINE 21 MG/24HR TD PT24
21.0000 mg | MEDICATED_PATCH | Freq: Every day | TRANSDERMAL | Status: DC
  Administered 2014-09-15 – 2014-09-22 (×8): 21 mg via TRANSDERMAL
  Filled 2014-09-15 (×10): qty 1

## 2014-09-15 MED ORDER — ALUM & MAG HYDROXIDE-SIMETH 200-200-20 MG/5ML PO SUSP: 30.0000 mL | ORAL | Status: DC | PRN

## 2014-09-15 MED ORDER — FLUOXETINE HCL 20 MG PO CAPS: 60.0000 mg | ORAL_CAPSULE | Freq: Every day | ORAL | Status: DC

## 2014-09-15 MED ORDER — PANTOPRAZOLE SODIUM 40 MG PO TBEC
40.0000 mg | DELAYED_RELEASE_TABLET | Freq: Every day | ORAL | Status: DC
  Administered 2014-09-15 – 2014-09-22 (×8): 40 mg via ORAL
  Filled 2014-09-15 (×7): qty 1
  Filled 2014-09-15: qty 14
  Filled 2014-09-15 (×3): qty 1
  Filled 2014-09-15: qty 14

## 2014-09-15 MED ORDER — PREDNISONE 5 MG PO TABS
15.0000 mg | ORAL_TABLET | Freq: Every day | ORAL | Status: DC
  Administered 2014-09-15 – 2014-09-22 (×8): 15 mg via ORAL
  Filled 2014-09-15 (×9): qty 1

## 2014-09-15 MED ORDER — LORATADINE 10 MG PO TABS: 10.0000 mg | ORAL_TABLET | Freq: Every day | ORAL | Status: DC | PRN

## 2014-09-15 MED ORDER — SENNOSIDES-DOCUSATE SODIUM 8.6-50 MG PO TABS: 1.0000 | ORAL_TABLET | Freq: Every evening | ORAL | Status: DC | PRN

## 2014-09-15 MED ORDER — FLUTICASONE PROPIONATE 50 MCG/ACT NA SUSP
2.0000 | Freq: Every day | NASAL | Status: DC
  Administered 2014-09-15 – 2014-09-22 (×8): 2 via NASAL
  Filled 2014-09-15 (×2): qty 16

## 2014-09-15 MED ORDER — HYDROCHLOROTHIAZIDE 25 MG PO TABS
25.0000 mg | ORAL_TABLET | Freq: Every day | ORAL | Status: DC
  Administered 2014-09-15 – 2014-09-22 (×8): 25 mg via ORAL
  Filled 2014-09-15 (×8): qty 1
  Filled 2014-09-15: qty 14
  Filled 2014-09-15 (×2): qty 1

## 2014-09-15 MED ORDER — SERTRALINE HCL 25 MG PO TABS
25.0000 mg | ORAL_TABLET | Freq: Every day | ORAL | Status: DC
  Administered 2014-09-15 – 2014-09-16 (×2): 25 mg via ORAL
  Filled 2014-09-15 (×4): qty 1

## 2014-09-15 MED ORDER — IPRATROPIUM BROMIDE 0.03 % NA SOLN
2.0000 | Freq: Three times a day (TID) | NASAL | Status: DC | PRN
  Administered 2014-09-21: 2 via NASAL
  Filled 2014-09-15: qty 30

## 2014-09-15 MED ORDER — CLONAZEPAM 0.5 MG PO TABS
0.5000 mg | ORAL_TABLET | Freq: Two times a day (BID) | ORAL | Status: DC | PRN
  Administered 2014-09-15: 0.5 mg via ORAL
  Filled 2014-09-15: qty 1

## 2014-09-15 MED ORDER — ACETAMINOPHEN 325 MG PO TABS
650.0000 mg | ORAL_TABLET | Freq: Four times a day (QID) | ORAL | Status: DC | PRN
Start: 2014-09-15 — End: 2014-09-22
  Administered 2014-09-15 – 2014-09-22 (×5): 650 mg via ORAL
  Filled 2014-09-15 (×5): qty 2

## 2014-09-15 MED ORDER — CLONIDINE HCL 0.1 MG PO TABS
0.1000 mg | ORAL_TABLET | Freq: Two times a day (BID) | ORAL | Status: DC
  Administered 2014-09-15 – 2014-09-16 (×3): 0.1 mg via ORAL
  Filled 2014-09-15 (×8): qty 1

## 2014-09-15 MED ORDER — ASPIRIN 81 MG PO CHEW
81.0000 mg | CHEWABLE_TABLET | Freq: Every day | ORAL | Status: DC
  Administered 2014-09-15 – 2014-09-22 (×8): 81 mg via ORAL
  Filled 2014-09-15 (×9): qty 1
  Filled 2014-09-15: qty 14
  Filled 2014-09-15 (×2): qty 1

## 2014-09-15 MED ORDER — GABAPENTIN 300 MG PO CAPS
300.0000 mg | ORAL_CAPSULE | Freq: Three times a day (TID) | ORAL | Status: DC
  Administered 2014-09-15 – 2014-09-16 (×3): 300 mg via ORAL
  Filled 2014-09-15 (×6): qty 1

## 2014-09-15 MED ORDER — CLONAZEPAM 0.5 MG PO TABS: 0.5000 mg | ORAL_TABLET | Freq: Three times a day (TID) | ORAL | Status: DC | PRN

## 2014-09-15 MED ORDER — BUDESONIDE-FORMOTEROL FUMARATE 160-4.5 MCG/ACT IN AERO
2.0000 | INHALATION_SPRAY | Freq: Two times a day (BID) | RESPIRATORY_TRACT | Status: DC
  Administered 2014-09-15 – 2014-09-22 (×15): 2 via RESPIRATORY_TRACT
  Filled 2014-09-15 (×2): qty 6

## 2014-09-15 NOTE — Progress Notes (Signed)
Pt has been up and active in the milieu today.  He rate both his depression hopelessness 6 and his anxiety 8 on self-inventory. He denied any S/H ideation or A/V/S. He did complain of feeling dizzy so ask patient to change positions and he gave a return demo and voiced understanding. His goal to day,'think positive things"
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
## 2014-09-15 NOTE — [facility] Group Notes (Signed)
[facility] LCSW Group Therapy  09/15/2014 2:52 PM  Type of Therapy:  Group Therapy  Participation Level:  Active  Participation Quality:  Attentive  Affect:  Appropriate  Cognitive:  Appropriate  Insight:  Improving  Engagement in Therapy:  Engaged  Modes of Intervention:  Discussion, Education, Socialization and Support  Summary of Progress/Problems:[employer] (MHA) speaker came to talk about his personal journey with substance abuse and mental illness. Group members were challenged to process ways by which to relate to the speaker. MHA speaker provided handouts and educational information pertaining to groups and services offered by the [facility]. Derec attended group and stayed the entire time. He was interested in the classes for becoming a peer specialist. He enjoyed the music played by the speaker.     Hyatt,Candace 09/15/2014, 2:52 PM
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
## 2014-09-15 NOTE — [facility] Group Notes (Signed)
[facility]   09/15/2014 11:18 AM  Participation Quality:  Active   Mood/Affect:  Depressed  Depression Rating:  5  Anxiety Rating:  8  Thoughts of Suicide:  No Will you contract for safety?   NA  Current AVH:  No  Plan for Discharge/Comments:  Pt stated he was having suicidal thoughts [facility]. He expressed anger towards his son because he has not had contact with him in 3 years. He reports difficulty sleeping due to environment and late admission. [facility] He plans to return home and continue receiving out patient services.    Transportation Means: Bus   Supports: Family   Hyatt,Candace
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
## 2014-09-15 NOTE — Plan of Care (Signed)
Problem: Diagnosis: Increased Risk For Suicide Attempt Goal: STG-Patient Will Report Suicidal Feelings to Staff Outcome: Progressing Pt refused any suicidal ideation on his self-inventory. He does denies any plans here in the hospital and does contract verbally to come to staff before acting on any self harm thoughts.
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
## 2014-09-15 NOTE — Progress Notes (Signed)
Nutrition Brief Note  Patient identified on the Malnutrition Screening Tool (MST) Report  Pt states that he is not concerned about his weight loss. States he used to average around 160 lb but he gained weight . Pt is trying to lose weight. Pt with no dentures which has caused him to adjust his eating habits. States he is eating fine now and his appetite is always good.  Wt Readings from Last 15 Encounters:  09/15/14 176 lb (79.833 kg)  09/07/14 179 lb 9.6 oz (81.466 kg)  09/07/14 180 lb 1.9 oz (81.702 kg)  09/02/14 181 lb 4.8 oz (82.237 kg)  08/17/14 178 lb 12.8 oz (81.103 kg)  08/10/14 182 lb (82.555 kg)  07/28/14 180 lb (81.647 kg)  07/15/14 180 lb (81.647 kg)  07/08/14 186 lb 3.2 oz (84.46 kg)  06/07/14 179 lb 4.8 oz (81.33 kg)  05/28/14 186 lb 8 oz (84.596 kg)  05/06/14 186 lb 6.4 oz (84.55 kg)  04/02/14 195 lb 8 oz (88.678 kg)  03/22/14 197 lb (89.359 kg)  03/01/14 192 lb (87.091 kg)    Body mass index is 32.72 kg/(m^2). Patient meets criteria for obesity based on current BMI.   Diet Order: Diet regular Pt is also offered choice of unit snacks mid-morning and mid-afternoon.  Pt is eating as desired.   Labs and medications reviewed.   No nutrition interventions warranted at this time. If nutrition issues arise, please consult RD.   Tilda FrancoLindsey Wymon Swaney, MS, RD, LDN Pager: [phone] After Hours Pager: [phone]

## 2014-09-15 NOTE — Progress Notes (Signed)
Adult Psychoeducational Group Note  Date:  09/15/2014 Time:  10:28 PM  Group Topic/Focus:  Wrap-Up Group:   The focus of this group is to help patients review their daily goal of treatment and discuss progress on daily workbooks.  Participation Level:  Minimal  Participation Quality:  Attentive  Affect:  Excited  Cognitive:  Oriented  Insight: Limited  Engagement in Group:  Engaged  Modes of Intervention:  Socialization and Support  Additional Comments:  Patient attended  and participated in group tonight. He reports having a good day. He went to his groups, took his medication, went for meals and took a nap because he has not been sleeping well lately.   Lita MainsFrancis, [facility] 09/15/2014, 10:28 PM

## 2014-09-15 NOTE — Tx Team (Addendum)
Initial Interdisciplinary Treatment Plan   PATIENT STRESSORS: Financial difficulties Health problems Loss of mom. Loss of brother-by suicide.    PATIENT STRENGTHS: Average or above average intelligence Capable of independent living General fund of knowledge Motivation for treatment/growth Religious Affiliation Supportive family/friends   PROBLEM LIST: Problem List/Patient Goals Date to be addressed Date deferred Reason deferred Estimated date of resolution  " I want to be back with my family"      For his suicidal thoughts "to be gone"       Increased risk for SI 1/20   D/C  Depression      Anxiety                               DISCHARGE CRITERIA:  Improved stabilization in mood, thinking, and/or behavior Medical problems require only outpatient monitoring Need for constant or close observation no longer present Reduction of life-threatening or endangering symptoms to within safe limits Verbal commitment to aftercare and medication compliance  PRELIMINARY DISCHARGE PLAN: Attend PHP/IOP  PATIENT/FAMIILY INVOLVEMENT: This treatment plan has been presented to and reviewed with the patient, Martin Singh.  The patient and family have been given the opportunity to ask questions and make suggestions.  Martin Singh A 09/15/2014, 3:53 AM

## 2014-09-15 NOTE — B[facility] Suicide Risk Assessment (Signed)
[facility] INPATIENT:  Family/Significant Other Suicide Prevention Education  Suicide Prevention Education:  Education Completed; Dedra Skeensnne Chase, [phone] has been identified by the patient as the family member/significant other with whom the patient will be residing, and identified as the person(s) who will aid the patient in the event of a mental health crisis (suicidal ideations/suicide attempt).  With written consent from the patient, the family member/significant other has been provided the following suicide prevention education, prior to the and/or following the discharge of the patient.  The suicide prevention education provided includes the following:  Suicide risk factors  Suicide prevention and interventions  National Suicide Hotline telephone number  [facility] assessment telephone number  [facility] Emergency Assistance 911  [facility] and/or Residential Mobile Crisis Unit telephone number  Request made of family/significant other to:  Remove weapons (e.g., guns, rifles, knives), all items previously/currently identified as safety concern.    Remove drugs/medications (over-the-counter, prescriptions, illicit drugs), all items previously/currently identified as a safety concern.  The family member/significant other verbalizes understanding of the suicide prevention education information provided.  The family member/significant other agrees to remove the items of safety concern listed above  Hyatt,Candace 09/15/2014, 3:23 PM

## 2014-09-15 NOTE — Progress Notes (Signed)
Pt has been up and active in the milieu today. He rated his depression and hopelessness a 6 and his anxiety a 8 on his self-inventory.  He denied any S/H ideation or A/V/H but did admit he was hearing voices before he arrived here  and denies at this time. He did c/o complain of lightness and we dicussed how he needs to change position slowly. He was able to return demonstration.
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
## 2014-09-16 DIAGNOSIS — F1120 Opioid dependence, uncomplicated: Secondary | ICD-10-CM

## 2014-09-16 DIAGNOSIS — F1920 Other psychoactive substance dependence, uncomplicated: Secondary | ICD-10-CM

## 2014-09-16 LAB — TSH: TSH: 3.152 u[IU]/mL (ref 0.350–4.500)

## 2014-09-16 MED ORDER — NAPROXEN 500 MG PO TABS
500.0000 mg | ORAL_TABLET | Freq: Two times a day (BID) | ORAL | Status: AC | PRN
  Administered 2014-09-17 – 2014-09-20 (×6): 500 mg via ORAL
  Filled 2014-09-16 (×7): qty 1

## 2014-09-16 MED ORDER — HYDROXYZINE HCL 25 MG PO TABS
25.0000 mg | ORAL_TABLET | Freq: Four times a day (QID) | ORAL | Status: AC | PRN
  Administered 2014-09-16 – 2014-09-21 (×13): 25 mg via ORAL
  Filled 2014-09-16 (×14): qty 1

## 2014-09-16 MED ORDER — CLONIDINE HCL 0.1 MG PO TABS
0.1000 mg | ORAL_TABLET | ORAL | Status: AC
  Administered 2014-09-18 – 2014-09-20 (×4): 0.1 mg via ORAL
  Filled 2014-09-16 (×4): qty 1

## 2014-09-16 MED ORDER — CLONIDINE HCL 0.1 MG PO TABS
0.1000 mg | ORAL_TABLET | Freq: Four times a day (QID) | ORAL | Status: AC
  Administered 2014-09-16 – 2014-09-18 (×8): 0.1 mg via ORAL
  Filled 2014-09-16 (×8): qty 1

## 2014-09-16 MED ORDER — LOPERAMIDE HCL 2 MG PO CAPS: 2.0000 mg | ORAL_CAPSULE | ORAL | Status: AC | PRN

## 2014-09-16 MED ORDER — TRAZODONE HCL 50 MG PO TABS
50.0000 mg | ORAL_TABLET | Freq: Every day | ORAL | Status: DC
  Administered 2014-09-16 – 2014-09-21 (×6): 50 mg via ORAL
  Filled 2014-09-16 (×3): qty 1
  Filled 2014-09-16: qty 14
  Filled 2014-09-16 (×5): qty 1

## 2014-09-16 MED ORDER — SERTRALINE HCL 50 MG PO TABS
50.0000 mg | ORAL_TABLET | Freq: Every day | ORAL | Status: DC
  Administered 2014-09-17 – 2014-09-22 (×6): 50 mg via ORAL
  Filled 2014-09-16 (×5): qty 1
  Filled 2014-09-16: qty 14
  Filled 2014-09-16 (×3): qty 1

## 2014-09-16 MED ORDER — METHOCARBAMOL 500 MG PO TABS
500.0000 mg | ORAL_TABLET | Freq: Three times a day (TID) | ORAL | Status: AC | PRN
  Administered 2014-09-17 – 2014-09-21 (×7): 500 mg via ORAL
  Filled 2014-09-16 (×7): qty 1

## 2014-09-16 MED ORDER — BENZTROPINE MESYLATE 0.5 MG PO TABS
0.5000 mg | ORAL_TABLET | ORAL | Status: DC
  Administered 2014-09-16 – 2014-09-22 (×12): 0.5 mg via ORAL
  Filled 2014-09-16 (×7): qty 1
  Filled 2014-09-16: qty 28
  Filled 2014-09-16: qty 1
  Filled 2014-09-16: qty 28
  Filled 2014-09-16 (×6): qty 1

## 2014-09-16 MED ORDER — CLONAZEPAM 0.5 MG PO TABS
0.5000 mg | ORAL_TABLET | Freq: Every day | ORAL | Status: DC | PRN
  Administered 2014-09-16: 0.5 mg via ORAL
  Filled 2014-09-16: qty 1

## 2014-09-16 MED ORDER — HYDROXYZINE HCL 25 MG PO TABS: 25.0000 mg | ORAL_TABLET | Freq: Three times a day (TID) | ORAL | Status: DC | PRN

## 2014-09-16 MED ORDER — RISPERIDONE 0.5 MG PO TBDP
0.5000 mg | ORAL_TABLET | ORAL | Status: DC
  Administered 2014-09-16 – 2014-09-22 (×12): 0.5 mg via ORAL
  Filled 2014-09-16 (×8): qty 1
  Filled 2014-09-16: qty 28
  Filled 2014-09-16 (×8): qty 1
  Filled 2014-09-16: qty 28

## 2014-09-16 MED ORDER — ONDANSETRON 4 MG PO TBDP: 4.0000 mg | ORAL_TABLET | Freq: Four times a day (QID) | ORAL | Status: AC | PRN

## 2014-09-16 MED ORDER — GABAPENTIN 300 MG PO CAPS
300.0000 mg | ORAL_CAPSULE | Freq: Three times a day (TID) | ORAL | Status: DC
  Administered 2014-09-16 – 2014-09-22 (×24): 300 mg via ORAL
  Filled 2014-09-16 (×13): qty 1
  Filled 2014-09-16: qty 56
  Filled 2014-09-16 (×9): qty 1
  Filled 2014-09-16: qty 56
  Filled 2014-09-16 (×3): qty 1
  Filled 2014-09-16: qty 56
  Filled 2014-09-16 (×6): qty 1
  Filled 2014-09-16: qty 56

## 2014-09-16 MED ORDER — CLONIDINE HCL 0.1 MG PO TABS
0.1000 mg | ORAL_TABLET | Freq: Every day | ORAL | Status: AC
  Administered 2014-09-21 – 2014-09-22 (×2): 0.1 mg via ORAL
  Filled 2014-09-16 (×3): qty 1

## 2014-09-16 MED ORDER — DICYCLOMINE HCL 20 MG PO TABS: 20.0000 mg | ORAL_TABLET | Freq: Four times a day (QID) | ORAL | Status: AC | PRN

## 2014-09-16 NOTE — Progress Notes (Signed)
D: Pt was up and present within the milieu this evening. Pt verbalized having anxiety this evening. Pt presented anxious in affect and mood. Pt's anxiety was decreased with a PRN Klonopin. Pt was in bed resting at this time. Pt is currently denying any SI/HI/AVH.  A: Writer administered scheduled and prn medications to pt, per MD orders. Continued support and availability as needed was extended to this pt. Staff continue to monitor pt with q5415min checks.  R: No adverse drug reactions noted. Pt receptive to treatment. Pt remains safe at this time.

## 2014-09-16 NOTE — Progress Notes (Signed)
D: Pt presents appropriate in affect and anxious in mood. Pt smiles during interaction with Clinical research associatewriter. Pt actively participates within the milieu. Pt observed interaction with others appropriately.Pt is negative for any SI/HI/AVH. Pt verbally endorses anxiety. Pt complained of itching in his groin area. Pt reports taking cortisone and Nystatin cream at home for his itching and rash. On-call extender notified and was told for writer to communicate the need for it to be followed-up with by the provider on Friday. She said the use of cornstarch powder as available on the unit was ok for use. Writer was not able to locate such. Pt was in bed asleep when writer went to update pt.  A: Writer administered scheduled and prn medications to pt, per MD orders. Continued support and availability as needed was extended to this pt. Staff continue to monitor pt with q5615min checks.  R: No adverse drug reactions noted. Pt receptive to treatment. Pt remains safe at this time.

## 2014-09-16 NOTE — Progress Notes (Signed)
[facility] MD Progress Note  09/16/2014 11:12 AM Martin Singh  MRN:  [id number] Subjective: Patient states 'I have a lot of anxiety sx. I am not sleeping at all and I have been picking my skin." Objective: Patient seen and chart reviewed.Patient discussed with treatment team. Patient today appears to be very anxious ,reports tactile hallucinations as well as skin excoriation. Patient with sleep difficulty ,agrees to try Trazodone for sleep.  Per collateral per CSW obtained from wife , patient with significant hx of Huffing ' since the past several weeks to months. Patient induced "black outs vs seizures " by huffing per wife. Patient also with severe Dilaudid abuse, borrows it from wife (she has prescribed dilaudid at home) . Patient however lacks insight in to his substance abuse.  Patient denies any SI/HI/AH/VH today.    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 Opioid use disorder, severe, dependence [F11.20] 09/16/2014  . Substance dependence, other [F19.20] 09/16/2014  . MDD (major depressive disorder), severe [F32.2] 09/15/2014  . Routine general medical examination at a health care facility [Z00.00] 09/07/2014  . Syncope and collapse [R55] 08/23/2014  . Syncope [R55] 08/23/2014  . Hypertension [I10] 08/23/2014  . Non-suppurative otitis media [H65.90] 08/17/2014  . Acute sinusitis [J01.90] 06/09/2014  . URI (upper respiratory infection) [J06.9] 05/06/2014  . Back pain [M54.9] 04/03/2014  . Prediabetes [R73.09] 03/01/2014  . Sleep apnea [G47.30] 01/11/2014  . 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 Dyspnea [R06.00] 11/17/2013  . IBS (irritable bowel syndrome) [K58.9] 04/02/2013  . Pernicious anemia [D51.0] 04/02/2013  . Anxiety and depression [F41.8] 03/17/2013  . Chronic daily headache [R51] 03/10/2013  . Alcoholism [F10.20] 01/20/2012  . Drug abuse and dependence [F19.20] 10/29/2011  . Chronic cough [R05] 09/11/2011  . Elev Transaminase/LDH [R74.0] 05/24/2008  . Hyperlipidemia [E78.5] 05/21/2008  . Hypertension, renal disease [I12.9, N18.9] 05/21/2008  . GERD [K21.9] 05/21/2008  . OSTEOARTHRITIS [M19.90] 05/21/2008   Total Time spent with patient: 30 minutes   Past Medical History:  Past Medical History  Diagnosis Date  . Hypertension   . Anxiety and depression   . Osteoarthrosis, unspecified whether generalized or localized, unspecified site   . Irritable bowel syndrome   . GERD (gastroesophageal reflux disease)     h/o esophagitis  . Allergic rhinitis   . Insomnia   . Diverticulosis   . Alcohol abuse 2012  . Polysubstance abuse   . Seizures   . Alcoholic pancreatitis   . Periumbilical hernia 10/05/45  . Fatty liver 10/08/11  . Internal hemorrhoids 03/13/11  . Hiatal hernia 03/13/11  . Asthmatic bronchitis   . Frequent headaches   . 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 Prediabetes 03/01/2014  . Depression     Past Surgical History  Procedure Laterality Date  . Tibula surgery Right 2002   Family History:  Family History  Problem Relation Age of Onset  . Heart disease Mother   . Gallbladder disease Mother   . Nephrolithiasis Mother   . Arthritis Mother   . Hyperlipidemia Mother   . Stroke Mother   . Irritable bowel syndrome Mother   . Cancer Mother  vulva  . Colon cancer Neg Hx   . Hypertension Father     moth  . Heart disease Father   . Arthritis Father   . Irritable bowel syndrome Brother     x 2   Social History:  History  Alcohol Use No    Comment: no alcohol in one yr; drank 35 years alcohol, quit 02/2012     History  Drug Use No     Comment: (denies current use) benzos- ; hx of THC, cocaine, uppers/downers    History   Social History  . Marital Status: Divorced    Spouse Name: N/A    Number of Children: 1  . Years of Education: N/A   Occupational History  . unemployed-- apllying for disability    Social History Main Topics  . Smoking status: Former Smoker -- 1.50 packs/day for 18 years    Quit date: 08/28/1991  . Smokeless tobacco: Current User    Types: Snuff     Comment: currently uses snuff.11/17/13  . Alcohol Use: No     Comment: no alcohol in one yr; drank 35 years alcohol, quit 02/2012  . Drug Use: No     Comment: (denies current use) benzos- ; hx of THC, cocaine, uppers/downers  . Sexual Activity:    Partners: Female     Comment: no birth control   Other Topics Concern  . None   Social History Narrative   Lives w/ fiancee    Additional History:    Sleep: Poor  Appetite:  Fair   Assessment:   Musculoskeletal: Strength & Muscle Tone: within normal limits Gait & Station: normal Patient leans: N/A   Psychiatric Specialty Exam: Physical Exam  Review of Systems  Constitutional: Negative.   HENT: Negative.   Eyes: Negative.   Respiratory: Negative.   Cardiovascular: Negative.   Gastrointestinal: Negative.   Genitourinary: Negative.   Musculoskeletal: Negative.   Skin:       Skin picking  Neurological: Negative.   Psychiatric/Behavioral: Positive for depression and substance abuse. The patient is nervous/anxious and has insomnia.     Blood pressure 122/85, pulse 75, temperature 98.7 F (37.1 C), temperature source Oral, resp. rate 18, height 5' 1.5" (1.562 m), weight 79.833 kg (176 lb), SpO2 100 %.Body mass index is 32.72 kg/(m^2).  General Appearance: Fairly Groomed  Engineer, water::  Fair  Speech:  Normal Rate  Volume:  Normal  Mood:  Anxious and Depressed  Affect:  Congruent  Thought Process:  Coherent  Orientation:  Full (Time, Place, and Person)  Thought Content:   Hallucinations: Tactile  Suicidal Thoughts:  No  Homicidal Thoughts:  No  Memory:  Immediate;   Fair Recent;   Fair Remote;   Fair  Judgement:  Impaired  Insight:  Shallow  Psychomotor Activity:  Normal  Concentration:  Fair  Recall:  [place]: Fair  Akathisia:  No  Handed:  Right  AIMS (if indicated):     Assets:  Communication Skills  ADL's:  Intact  Cognition: WNL  Sleep:  Number of Hours: 6.5     Current Medications: Current Facility-Administered Medications  Medication Dose Route Frequency Provider Last Rate Last Dose  . acetaminophen (TYLENOL) tablet 650 mg  650 mg Oral Q6H PRN Laverle Hobby, PA-C   650 mg at 09/15/14 2129  . albuterol (PROVENTIL HFA;VENTOLIN HFA) 108 (90 BASE) MCG/ACT inhaler 2 puff  2 puff Inhalation Q6H PRN Laverle Hobby, PA-C      .  alum & mag hydroxide-simeth (MAALOX/MYLANTA) 200-200-20 MG/5ML suspension 30 mL  30 mL Oral Q4H PRN Laverle Hobby, PA-C      . aspirin chewable tablet 81 mg  81 mg Oral Daily Laverle Hobby, PA-C   81 mg at 09/16/14 5361  . atenolol (TENORMIN) tablet 100 mg  100 mg Oral Daily Laverle Hobby, PA-C   100 mg at 09/16/14 4431  . budesonide-formoterol (SYMBICORT) 160-4.5 MCG/ACT inhaler 2 puff  2 puff Inhalation BID Laverle Hobby, PA-C   2 puff at 09/16/14 5400  . carbamazepine (TEGRETOL XR) 12 hr tablet 400 mg  400 mg Oral BID Laverle Hobby, PA-C   400 mg at 09/16/14 0805  . clonazePAM (KLONOPIN) tablet 0.5 mg  0.5 mg Oral Daily PRN Fawaz Borquez, MD      . cloNIDine (CATAPRES) tablet 0.1 mg  0.1 mg Oral QID Ursula Alert, MD       Followed by  . [START ON 09/18/2014] cloNIDine (CATAPRES) tablet 0.1 mg  0.1 mg Oral BH-qamhs Ursula Alert, MD       Followed by  . [START ON 09/21/2014] cloNIDine (CATAPRES) tablet 0.1 mg  0.1 mg Oral QAC breakfast Mykayla Brinton, MD      . dicyclomine (BENTYL) tablet 20 mg  20 mg Oral Q6H PRN Ursula Alert, MD      . fesoterodine (TOVIAZ) tablet 4 mg   4 mg Oral Daily Laverle Hobby, PA-C   4 mg at 09/16/14 8676  . fluticasone (FLONASE) 50 MCG/ACT nasal spray 2 spray  2 spray Each Nare Daily Laverle Hobby, PA-C   2 spray at 09/16/14 [phone]  . gabapentin (NEURONTIN) capsule 300 mg  300 mg Oral TID WC & HS Brynja Marker, MD      . hydrochlorothiazide (HYDRODIURIL) tablet 25 mg  25 mg Oral Daily Laverle Hobby, PA-C   25 mg at 09/16/14 9326  . hydrOXYzine (ATARAX/VISTARIL) tablet 25 mg  25 mg Oral Q6H PRN Agnes Brightbill, MD      . ipratropium (ATROVENT) 0.03 % nasal spray 2 spray  2 spray Each Nare TID PRN Laverle Hobby, PA-C      . loperamide (IMODIUM) capsule 2-4 mg  2-4 mg Oral PRN Ursula Alert, MD      . loratadine (CLARITIN) tablet 10 mg  10 mg Oral Daily PRN Laverle Hobby, PA-C      . magnesium hydroxide (MILK OF MAGNESIA) suspension 30 mL  30 mL Oral Daily PRN Laverle Hobby, PA-C      . methocarbamol (ROBAXIN) tablet 500 mg  500 mg Oral Q8H PRN Ruther Ephraim, MD      . naproxen (NAPROSYN) tablet 500 mg  500 mg Oral BID PRN Ursula Alert, MD      . nicotine (NICODERM CQ - dosed in mg/24 hours) patch 21 mg  21 mg Transdermal Q0600 Ursula Alert, MD   21 mg at 09/16/14 7124  . ondansetron (ZOFRAN-ODT) disintegrating tablet 4 mg  4 mg Oral Q6H PRN Ursula Alert, MD      . pantoprazole (PROTONIX) EC tablet 40 mg  40 mg Oral Daily Laverle Hobby, PA-C   40 mg at 09/16/14 5809  . potassium chloride SA (K-DUR,KLOR-CON) CR tablet 20 mEq  20 mEq Oral BID Laverle Hobby, PA-C   20 mEq at 09/16/14 0805  . predniSONE (DELTASONE) tablet 15 mg  15 mg Oral Q breakfast Laverle Hobby, PA-C   15 mg at 09/16/14  0626  . senna-docusate (Senokot-S) tablet 1 tablet  1 tablet Oral QHS PRN Laverle Hobby, PA-C      . [START ON 09/17/2014] sertraline (ZOLOFT) tablet 50 mg  50 mg Oral Daily Autum Benfer, MD      . topiramate (TOPAMAX) tablet 25 mg  25 mg Oral Daily Laverle Hobby, PA-C   25 mg at 09/16/14 9485  . traZODone (DESYREL) tablet 50 mg   50 mg Oral QHS Ursula Alert, MD        Lab Results:  Results for orders placed or performed during the hospital encounter of 09/14/14 (from the past 48 hour(s))  Acetaminophen level     Status: Abnormal   Collection Time: 09/14/14  4:43 PM  Result Value Ref Range   Acetaminophen (Tylenol), Serum <10.0 (L) 10 - 30 ug/mL    Comment:        THERAPEUTIC CONCENTRATIONS VARY SIGNIFICANTLY. A RANGE OF 10-30 ug/mL MAY BE AN EFFECTIVE CONCENTRATION FOR MANY PATIENTS. HOWEVER, SOME ARE BEST TREATED AT CONCENTRATIONS OUTSIDE THIS RANGE. ACETAMINOPHEN CONCENTRATIONS >150 ug/mL AT 4 HOURS AFTER INGESTION AND >50 ug/mL AT 12 HOURS AFTER INGESTION ARE OFTEN ASSOCIATED WITH TOXIC REACTIONS.   CBC     Status: None   Collection Time: 09/14/14  4:43 PM  Result Value Ref Range   WBC 9.4 4.0 - 10.5 K/uL   RBC 4.58 4.22 - 5.81 MIL/uL   Hemoglobin 13.0 13.0 - 17.0 g/dL   HCT 40.8 39.0 - 52.0 %   MCV 89.1 78.0 - 100.0 fL   MCH 28.4 26.0 - 34.0 pg   MCHC 31.9 30.0 - 36.0 g/dL   RDW 13.5 11.5 - 15.5 %   Platelets 289 150 - 400 K/uL  Comprehensive metabolic panel     Status: Abnormal   Collection Time: 09/14/14  4:43 PM  Result Value Ref Range   Sodium 142 135 - 145 mmol/L    Comment: Please note change in reference range.   Potassium 3.2 (L) 3.5 - 5.1 mmol/L    Comment: Please note change in reference range.   Chloride 103 96 - 112 mEq/L   CO2 28 19 - 32 mmol/L   Glucose, Bld 105 (H) 70 - 99 mg/dL   BUN 16 6 - 23 mg/dL   Creatinine, Ser 1.02 0.50 - 1.35 mg/dL   Calcium 9.8 8.4 - 10.5 mg/dL   Total Protein 8.1 6.0 - 8.3 g/dL   Albumin 4.5 3.5 - 5.2 g/dL   AST 66 (H) 0 - 37 U/L   ALT 53 0 - 53 U/L   Alkaline Phosphatase 85 39 - 117 U/L   Total Bilirubin 0.6 0.3 - 1.2 mg/dL   GFR calc non Af Amer 85 (L) >90 mL/min   GFR calc Af Amer >90 >90 mL/min    Comment: (NOTE) The eGFR has been calculated using the CKD EPI equation. This calculation has not been validated in all clinical  situations. eGFR's persistently <90 mL/min signify possible Chronic Kidney Disease.    Anion gap 11 5 - 15  Ethanol (ETOH)     Status: None   Collection Time: 09/14/14  4:43 PM  Result Value Ref Range   Alcohol, Ethyl (B) <5 0 - 9 mg/dL    Comment:        LOWEST DETECTABLE LIMIT FOR SERUM ALCOHOL IS 11 mg/dL FOR MEDICAL PURPOSES ONLY   Salicylate level     Status: None   Collection Time: 09/14/14  4:43 PM  Result Value Ref Range   Salicylate Lvl <5.3 2.8 - 20.0 mg/dL  Carbamazepine level, total     Status: None   Collection Time: 09/14/14  4:43 PM  Result Value Ref Range   Carbamazepine Lvl 5.9 4.0 - 12.0 ug/mL    Comment: Performed at [facility]  Urine Drug Screen     Status: Abnormal   Collection Time: 09/14/14  4:49 PM  Result Value Ref Range   Opiates POSITIVE (A) NONE DETECTED   Cocaine NONE DETECTED NONE DETECTED   Benzodiazepines NONE DETECTED NONE DETECTED   Amphetamines NONE DETECTED NONE DETECTED   Tetrahydrocannabinol NONE DETECTED NONE DETECTED   Barbiturates NONE DETECTED NONE DETECTED    Comment:        DRUG SCREEN FOR MEDICAL PURPOSES ONLY.  IF CONFIRMATION IS NEEDED FOR ANY PURPOSE, NOTIFY LAB WITHIN 5 DAYS.        LOWEST DETECTABLE LIMITS FOR URINE DRUG SCREEN Drug Class       Cutoff (ng/mL) Amphetamine      1000 Barbiturate      200 Benzodiazepine   299 Tricyclics       242 Opiates          300 Cocaine          300 THC              50   I-stat troponin, ED (not at [facility])     Status: None   Collection Time: 09/14/14  5:01 PM  Result Value Ref Range   Troponin i, poc 0.00 0.00 - 0.08 ng/mL   Comment 3            Comment: Due to the release kinetics of cTnI, a negative result within the first hours of the onset of symptoms does not rule out myocardial infarction with certainty. If myocardial infarction is still suspected, repeat the test at appropriate intervals.     Physical Findings: AIMS: Facial and Oral Movements Muscles of  Facial Expression: None, normal Lips and Perioral Area: None, normal Jaw: None, normal Tongue: None, normal,Extremity Movements Upper (arms, wrists, hands, fingers): None, normal Lower (legs, knees, ankles, toes): None, normal, Trunk Movements Neck, shoulders, hips: None, normal, Overall Severity Severity of abnormal movements (highest score from questions above): None, normal Incapacitation due to abnormal movements: None, normal Patient's awareness of abnormal movements (rate only patient's report): No Awareness, Dental Status Current problems with teeth and/or dentures?: Yes (missing upper teeth) Does patient usually wear dentures?: No  CIWA:    COWS:     Treatment Plan Summary: Daily contact with patient to assess and evaluate symptoms and progress in treatment, Medication management. Will increase Zoloft to 50 mg po daily.  Will taper off Klonopin. Will increase Gabapentin to 300 mg po 4 times daily, patient reported this was started for pain. Will continue Tegretol as per recommendations from Neurology. Will start COWS/Clonidine protocol. Will add Risperdal 0.5 mg po bid for skin picking. Will add Trazodone 50 mg po qhs for sleep. CSW will work on referral to substance abuse program.   Medical Decision Making:  New problem, with additional work up planned, Review of Psycho-Social Stressors (1), Review or order clinical lab tests (1), Established Problem, Worsening (2), Review of Medication Regimen & Side Effects (2) and Review of New Medication or Change in Dosage (2) Problem Points:  Established problem, worsening (2), New problem, with no additional work-up planned (3), Review of last therapy session (1) and Review of psycho-social stressors (  1) Data Points:  Review or order medicine tests (1) Review and summation of old records (2) Review of medication regiment & side effects (2) Review of new medications or change in dosage (2)    Ragan Reale MD 09/16/2014, 11:12  AM

## 2014-09-16 NOTE — [facility] Group Notes (Signed)
[facility] Group Notes:  (Nursing/MHT/Case Management/Adjunct)  Date:  09/16/2014  Time:  9:48 AM  Type of Therapy:  Nurse Education  Participation Level:  Active  Participation Quality:  Appropriate and Attentive  Affect:  Appropriate  Cognitive:  Alert and Appropriate  Insight:  Appropriate  Engagement in Group:  Supportive  Modes of Intervention:  Problem-solving and Support  Summary of Progress/Problems: Patient has insight into issues; receptive to treatment  Andres Egeritchett, Gurman Ashland Hundley 09/16/2014, 9:48 AM

## 2014-09-16 NOTE — Progress Notes (Signed)
Patient ID: Martin Singh, male   DOB: September 12, 1965, 49 y.o.   MRN: [id number]  D: Patient pleasant on approach this am. Reports depression and feelings of hopelessness "5" on scale and anxiety "8" on scale. Contracts for safety on the unit. He wants to work on his anxiety why he is here. We talked about maybe some of his medications causing some of his anxiety. Also we over fall precautions with him. A: Staff will monitor on q 15 minute checks, follow treatment plan, and give meds as ordered. R: Cooperative on the unit. Took meds without issue.

## 2014-09-16 NOTE — Progress Notes (Signed)
Patient did attend the evening karaoke group. Pt was supportive, engaged, and participated by singing a couple of songs.

## 2014-09-16 NOTE — [facility] Group Notes (Signed)
[facility] LCSW Group Therapy  09/16/2014 10:21 AM   Type of Therapy:  Group Therapy  Participation Level:  Active  Participation Quality:  Attentive  Affect:  Appropriate  Cognitive:  Appropriate  Insight:  Improving  Engagement in Therapy:  Engaged  Modes of Intervention:  Clarification, Education, Exploration and Socialization  Summary of Progress/Problems: Today's group focused on relapse prevention.  We defined the term, and then brainstormed on ways to prevent relapse.   Talked about needing to set boundaries with others.  Putting on the full armor of God is how he does this.  When the issue of extracting self from difficult situations came up, he suggested that one could just excuse themselves to go to the bathroom.  Creative.  Does not have problems tracking, but sometimes has difficulty staying up with others during group conversation. Daryel Geraldorth, Adamary Savary B 09/16/2014 , 10:21 AM

## 2014-09-17 NOTE — Progress Notes (Addendum)
[facility] MD Progress Note  09/17/2014 12:09 PM Martin Singh  MRN:  [id number] Subjective: Patient states 'I feel a lot better today, I slept well last night.' Objective: Patient seen and chart reviewed.Patient discussed with treatment team. Patient today appears to be less anxious ,with overall improvement in his symptoms. Patient also had skin excoriations due to picking ,likely secondary to opioid withdrawal. Patient reports sleep as improved.  09/16/14 Per collateral per CSW obtained from wife , patient with significant hx of Huffing ' since the past several weeks to months. Patient induced "black outs vs seizures " by huffing per wife. Patient also with severe Dilaudid abuse, borrows it from wife (she has prescribed dilaudid at home) . Patient however lacks insight in to his substance abuse.  Patient denies any SI/HI/AH/VH today.    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 Opioid use disorder, severe, dependence [F11.20] 09/16/2014  . Substance dependence, other [F19.20] 09/16/2014  . MDD (major depressive disorder), severe [F32.2] 09/15/2014  . Routine general medical examination at a health care facility [Z00.00] 09/07/2014  . Syncope and collapse [R55] 08/23/2014  . Syncope [R55] 08/23/2014  . Hypertension [I10] 08/23/2014  . Non-suppurative otitis media [H65.90] 08/17/2014  . Acute sinusitis [J01.90] 06/09/2014  . URI (upper respiratory infection) [J06.9] 05/06/2014  . Back pain [M54.9] 04/03/2014  . Prediabetes [R73.09] 03/01/2014  . Sleep apnea [G47.30] 01/11/2014  . 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 Dyspnea [R06.00] 11/17/2013  . IBS (irritable bowel syndrome) [K58.9] 04/02/2013  . Pernicious anemia [D51.0] 04/02/2013  . Anxiety and depression [F41.8] 03/17/2013  . Chronic daily headache [R51] 03/10/2013  . Alcoholism [F10.20] 01/20/2012  . Drug abuse and dependence [F19.20] 10/29/2011  . Chronic cough [R05] 09/11/2011  . Elev Transaminase/LDH [R74.0] 05/24/2008  . Hyperlipidemia [E78.5] 05/21/2008  . Hypertension, renal disease [I12.9, N18.9] 05/21/2008  . GERD [K21.9] 05/21/2008  . OSTEOARTHRITIS [M19.90] 05/21/2008   Total Time spent with patient: 30 minutes   Past Medical History:  Past Medical History  Diagnosis Date  . Hypertension   . Anxiety and depression   . Osteoarthrosis, unspecified whether generalized or localized, unspecified site   . Irritable bowel syndrome   . GERD (gastroesophageal reflux disease)     h/o esophagitis  . Allergic rhinitis   . Insomnia   . Diverticulosis   . Alcohol abuse 2012  . Polysubstance abuse   . Seizures   . Alcoholic pancreatitis   . Periumbilical hernia 12/13/12  . Fatty liver 10/08/11  . Internal hemorrhoids 03/13/11  . Hiatal hernia 03/13/11  . Asthmatic bronchitis   . Frequent headaches   . 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 Prediabetes 03/01/2014  . Depression     Past Surgical History  Procedure Laterality Date  . Tibula surgery Right 2002   Family History:  Family History  Problem Relation Age of Onset  . Heart disease Mother   . Gallbladder disease Mother   . Nephrolithiasis Mother   . Arthritis Mother   . Hyperlipidemia Mother   . Stroke Mother   . Irritable bowel syndrome Mother   . Cancer Mother     vulva  .  Colon cancer Neg Hx   . Hypertension Father     moth  . Heart disease Father   . Arthritis Father   . Irritable bowel syndrome Brother     x 2   Social History:  History  Alcohol Use No    Comment: no alcohol in one yr; drank 35 years alcohol, quit 02/2012      History  Drug Use No    Comment: (denies current use) benzos- ; hx of THC, cocaine, uppers/downers    History   Social History  . Marital Status: Divorced    Spouse Name: N/A    Number of Children: 1  . Years of Education: N/A   Occupational History  . unemployed-- apllying for disability    Social History Main Topics  . Smoking status: Former Smoker -- 1.50 packs/day for 18 years    Quit date: 08/28/1991  . Smokeless tobacco: Current User    Types: Snuff     Comment: currently uses snuff.11/17/13  . Alcohol Use: No     Comment: no alcohol in one yr; drank 35 years alcohol, quit 02/2012  . Drug Use: No     Comment: (denies current use) benzos- ; hx of THC, cocaine, uppers/downers  . Sexual Activity:    Partners: Female     Comment: no birth control   Other Topics Concern  . None   Social History Narrative   Lives w/ fiancee    Additional History:    Sleep: Fair  Appetite:  Fair    Musculoskeletal: Strength & Muscle Tone: within normal limits Gait & Station: normal Patient leans: N/A   Psychiatric Specialty Exam: Physical Exam  Review of Systems  Constitutional: Negative.   Skin:       Skiin picking with excoriations  Psychiatric/Behavioral: Positive for depression, hallucinations and substance abuse. The patient is nervous/anxious.     Blood pressure 111/72, pulse 79, temperature 97.9 F (36.6 C), temperature source Oral, resp. rate 18, height 5' 1.5" (1.562 m), weight 79.833 kg (176 lb), SpO2 100 %.Body mass index is 32.72 kg/(m^2).  General Appearance: Fairly Groomed  Patent attorneyye Contact::  Fair  Speech:  Normal Rate  Volume:  Normal  Mood:  Anxious and Depressed improving  Affect:  Congruent  Thought Process:  Coherent  Orientation:  Full (Time, Place, and Person)  Thought Content:  Hallucinations: Tactile  Suicidal Thoughts:  No  Homicidal Thoughts:  No  Memory:  Immediate;   Fair Recent;   Fair Remote;   Fair  Judgement:  Impaired  Insight:   Shallow  Psychomotor Activity:  Normal  Concentration:  Fair  Recall:  FiservFair  Fund of Knowledge:Fair  Language: Fair  Akathisia:  No  Handed:  Right  AIMS (if indicated):     Assets:  Communication Skills  ADL's:  Intact  Cognition: WNL  Sleep:  Number of Hours: 6.25     Current Medications: Current Facility-Administered Medications  Medication Dose Route Frequency Provider Last Rate Last Dose  . acetaminophen (TYLENOL) tablet 650 mg  650 mg Oral Q6H PRN Kerry HoughSpencer E Simon, PA-C   650 mg at 09/15/14 2129  . albuterol (PROVENTIL HFA;VENTOLIN HFA) 108 (90 BASE) MCG/ACT inhaler 2 puff  2 puff Inhalation Q6H PRN Kerry HoughSpencer E Simon, PA-C   2 puff at 09/16/14 2156  . alum & mag hydroxide-simeth (MAALOX/MYLANTA) 200-200-20 MG/5ML suspension 30 mL  30 mL Oral Q4H PRN Kerry HoughSpencer E Simon, PA-C      . aspirin chewable tablet 81  mg  81 mg Oral Daily Kerry Hough, PA-C   81 mg at 09/17/14 1308  . atenolol (TENORMIN) tablet 100 mg  100 mg Oral Daily Kerry Hough, PA-C   100 mg at 09/17/14 6578  . risperiDONE (RISPERDAL M-TABS) disintegrating tablet 0.5 mg  0.5 mg Oral BH-qamhs Enriqueta Augusta, MD   0.5 mg at 09/17/14 0806   And  . benztropine (COGENTIN) tablet 0.5 mg  0.5 mg Oral BH-qamhs Andreka Stucki, MD   0.5 mg at 09/17/14 0806  . budesonide-formoterol (SYMBICORT) 160-4.5 MCG/ACT inhaler 2 puff  2 puff Inhalation BID Kerry Hough, PA-C   2 puff at 09/17/14 0813  . carbamazepine (TEGRETOL XR) 12 hr tablet 400 mg  400 mg Oral BID Kerry Hough, PA-C   400 mg at 09/17/14 4696  . cloNIDine (CATAPRES) tablet 0.1 mg  0.1 mg Oral QID Jomarie Longs, MD   0.1 mg at 09/17/14 1204   Followed by  . [START ON 09/18/2014] cloNIDine (CATAPRES) tablet 0.1 mg  0.1 mg Oral BH-qamhs Jomarie Longs, MD       Followed by  . [START ON 09/21/2014] cloNIDine (CATAPRES) tablet 0.1 mg  0.1 mg Oral QAC breakfast Romy Mcgue, MD      . dicyclomine (BENTYL) tablet 20 mg  20 mg Oral Q6H PRN Jomarie Longs, MD      .  fesoterodine (TOVIAZ) tablet 4 mg  4 mg Oral Daily Kerry Hough, PA-C   4 mg at 09/17/14 0806  . fluticasone (FLONASE) 50 MCG/ACT nasal spray 2 spray  2 spray Each Nare Daily Kerry Hough, PA-C   2 spray at 09/17/14 0813  . gabapentin (NEURONTIN) capsule 300 mg  300 mg Oral TID WC & HS Glennis Borger, MD   300 mg at 09/17/14 1205  . hydrochlorothiazide (HYDRODIURIL) tablet 25 mg  25 mg Oral Daily Kerry Hough, PA-C   25 mg at 09/17/14 0845  . hydrOXYzine (ATARAX/VISTARIL) tablet 25 mg  25 mg Oral Q6H PRN Jomarie Longs, MD   25 mg at 09/17/14 2952  . ipratropium (ATROVENT) 0.03 % nasal spray 2 spray  2 spray Each Nare TID PRN Kerry Hough, PA-C      . loperamide (IMODIUM) capsule 2-4 mg  2-4 mg Oral PRN Jomarie Longs, MD      . loratadine (CLARITIN) tablet 10 mg  10 mg Oral Daily PRN Kerry Hough, PA-C      . magnesium hydroxide (MILK OF MAGNESIA) suspension 30 mL  30 mL Oral Daily PRN Kerry Hough, PA-C      . methocarbamol (ROBAXIN) tablet 500 mg  500 mg Oral Q8H PRN Tavien Chestnut, MD      . naproxen (NAPROSYN) tablet 500 mg  500 mg Oral BID PRN Jomarie Longs, MD      . nicotine (NICODERM CQ - dosed in mg/24 hours) patch 21 mg  21 mg Transdermal Q0600 Jomarie Longs, MD   21 mg at 09/17/14 8413  . ondansetron (ZOFRAN-ODT) disintegrating tablet 4 mg  4 mg Oral Q6H PRN Jomarie Longs, MD      . pantoprazole (PROTONIX) EC tablet 40 mg  40 mg Oral Daily Kerry Hough, PA-C   40 mg at 09/17/14 0806  . predniSONE (DELTASONE) tablet 15 mg  15 mg Oral Q breakfast Kerry Hough, PA-C   15 mg at 09/17/14 0805  . senna-docusate (Senokot-S) tablet 1 tablet  1 tablet Oral QHS PRN Kerry Hough, PA-C      .  sertraline (ZOLOFT) tablet 50 mg  50 mg Oral Daily Jomarie Longs, MD   50 mg at 09/17/14 0806  . topiramate (TOPAMAX) tablet 25 mg  25 mg Oral Daily Kerry Hough, PA-C   25 mg at 09/17/14 1610  . traZODone (DESYREL) tablet 50 mg  50 mg Oral QHS Jomarie Longs, MD   50 mg at  09/16/14 2151    Lab Results:  Results for orders placed or performed during the hospital encounter of 09/15/14 (from the past 48 hour(s))  TSH     Status: None   Collection Time: 09/16/14  7:10 AM  Result Value Ref Range   TSH 3.152 0.350 - 4.500 uIU/mL    Comment: Performed at [facility]    Physical Findings: AIMS: Facial and Oral Movements Muscles of Facial Expression: None, normal Lips and Perioral Area: None, normal Jaw: None, normal Tongue: None, normal,Extremity Movements Upper (arms, wrists, hands, fingers): None, normal Lower (legs, knees, ankles, toes): None, normal, Trunk Movements Neck, shoulders, hips: None, normal, Overall Severity Severity of abnormal movements (highest score from questions above): None, normal Incapacitation due to abnormal movements: None, normal Patient's awareness of abnormal movements (rate only patient's report): No Awareness, Dental Status Current problems with teeth and/or dentures?: Yes (missing upper teeth) Does patient usually wear dentures?: No  CIWA:    COWS:  COWS Total Score: 2     Assessment; Patient with significant hx of dilaudid abuse as well as huffing ,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 Patient currently showing some prpogress on current medications.   Treatment Plan Summary: Daily contact with patient to assess and evaluate symptoms and progress in treatment, Medication management. Will continue Zoloft  50 mg po daily.  Will taper off Klonopin. Will continue Gabapentin to 300 mg po 4 times daily, patient reported this was started for pain. Will continue Tegretol as per recommendations from Neurology. Will start COWS/Clonidine protocol. Will add Risperdal 0.5 mg po bid for skin excoriations ,likely secondary to opioid withdrawal. Will add Trazodone 50 mg po qhs for sleep. CSW will work on referral to substance abuse program.   Medical Decision Making:  Established Problem, Stable/Improving  (1), New problem, with additional work up planned, Review of Psycho-Social Stressors (1), Review or order clinical lab tests (1), Review of Medication Regimen & Side Effects (2) and Review of New Medication or Change in Dosage (2) Problem Points:  Established problem, stable/improving (1), New problem, with no additional work-up planned (3), Review of last therapy session (1) and Review of psycho-social stressors (1) Data Points:  Review or order medicine tests (1) Review and summation of old records (2) Review of medication regiment & side effects (2) Review of new medications or change in dosage (2)    Coti Burd MD 09/17/2014, 12:09 PM

## 2014-09-17 NOTE — Progress Notes (Signed)
D) Pt has been attending the program and interacting with his peers. Has requested Medication for anxiety in the  morning and again in the afternoon. Affect is pleasant. Pt states he really is feeling better overall because "I slept last night really good". Denies SI and HI. Rates his depression at a 4, hopelessness at a 3 and his anxiety at a 6. A) Given support, reassurance and praise along with encouragement. Provided with a 1:1 R) Denies SI and HI, delusions and hallucinations.

## 2014-09-17 NOTE — Progress Notes (Signed)
D: Patient stated he is doing well. Pt reports incrrease anxiety. Pt thought process is organized and behavior is appropriate. Pt denies SI/HI/AVH. Pt attended evening wrap up group and engaged in discussion.   A: Met with pt 1:1. Medications administered as prescribed. Writer encouraged pt to discuss feelings. Pt encouraged to come to staff with any questions or concerns.   R: Patient is safe on the unit. He is complaint with medications and denies any adverse reaction.

## 2014-09-17 NOTE — Plan of Care (Signed)
Problem: Diagnosis: Increased Risk For Suicide Attempt Goal: STG-Patient Will Attend All Groups On The Unit Outcome: Progressing Pt actively attends evening groups with participation.

## 2014-09-18 DIAGNOSIS — F1190 Opioid use, unspecified, uncomplicated: Secondary | ICD-10-CM

## 2014-09-18 DIAGNOSIS — F332 Major depressive disorder, recurrent severe without psychotic features: Principal | ICD-10-CM

## 2014-09-18 MED ORDER — NYSTATIN 100000 UNIT/GM EX CREA
TOPICAL_CREAM | Freq: Two times a day (BID) | CUTANEOUS | Status: DC
  Administered 2014-09-18 – 2014-09-22 (×6): via TOPICAL
  Filled 2014-09-18 (×2): qty 15

## 2014-09-18 NOTE — Progress Notes (Signed)
D) Pt has been attending the program and interacting with his peers. Affect and mood are appropriate. Pt denies SI and HI, delusions and hallucinations. States that he slept well last night and is looking forward to being discharged tomorrow. Rates his depression at a 2, hopelessness at a 3 and his anxiety at a 3. Denies SI and HI. A) Given support, reassurance and praise. Provided with a 1:1. Therapeutic humor used. R) Pt. Attending the groups and interacting appropriately with his peers.

## 2014-09-18 NOTE — Progress Notes (Signed)
D: Patient alert and pleasant on approach. Pt reports he will continue outpatient at [facility]. Pt reports he is doing well with decrease anxiety. Pt thought process is organized and behavior is appropriate. Pt denies SI/HI/AVH. Pt attended evening wrap up group and engaged in discussion.   A: Met with pt 1:1. Medications administered as prescribed. Writer encouraged pt to discuss feelings. Pt encouraged to come to staff with any questions or concerns.   R: Patient is safe on the unit. He is complaint with medications and denies any adverse reaction.

## 2014-09-18 NOTE — Plan of Care (Signed)
Problem: Alteration in mood Goal: STG-Patient is able to discuss feelings and issues (Patient is able to discuss feelings and issues leading to depression)  Outcome: Progressing Pt interacting well with peers and discuss feeling with Clinical research associatewriter

## 2014-09-18 NOTE — Progress Notes (Addendum)
Patient ID: Martin Singh, male   DOB: 1965-12-14, 49 y.o.   MRN: [id number] [facility]  09/18/2014 2:34 PM Martin Singh  MRN:  [id number] Subjective: Patient states that his groin folds were itchy.  Otherwise, he feels better. Objective: Patient seen and chart reviewed.Patient discussed with treatment team. Patient today appears to be less anxious ,with overall improvement in his symptoms.  Patient's grooin examine dand were slightly red.  History of yeast infection.  Ordered cream Nystatin. Patient reports sleep as improved.  Patient denies any SI/HI/AH/VH today.  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 Opioid use disorder, severe, dependence [F11.20] 09/16/2014  . Substance dependence, other [F19.20] 09/16/2014  . MDD (major depressive disorder), severe [F32.2] 09/15/2014  . Routine general medical examination at a health care facility [Z00.00] 09/07/2014  . Syncope and collapse [R55] 08/23/2014  . Syncope [R55] 08/23/2014  . Hypertension [I10] 08/23/2014  . Non-suppurative otitis media [H65.90] 08/17/2014  . Acute sinusitis [J01.90] 06/09/2014  . URI (upper respiratory infection) [J06.9] 05/06/2014  . Back pain [M54.9] 04/03/2014  . Prediabetes [R73.09] 03/01/2014  . Sleep apnea [G47.30] 01/11/2014  . 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 Dyspnea [R06.00] 11/17/2013  . IBS (irritable bowel syndrome) [K58.9] 04/02/2013  . Pernicious anemia [D51.0] 04/02/2013  . Anxiety and depression [F41.8] 03/17/2013  . Chronic daily headache [R51] 03/10/2013  . Alcoholism [F10.20] 01/20/2012  . Drug  abuse and dependence [F19.20] 10/29/2011  . Chronic cough [R05] 09/11/2011  . Elev Transaminase/LDH [R74.0] 05/24/2008  . Hyperlipidemia [E78.5] 05/21/2008  . Hypertension, renal disease [I12.9, N18.9] 05/21/2008  . GERD [K21.9] 05/21/2008  . OSTEOARTHRITIS [M19.90] 05/21/2008   Total Time spent with patient: 30 minutes   Past Medical History:  Past Medical History  Diagnosis Date  . Hypertension   . Anxiety and depression   . Osteoarthrosis, unspecified whether generalized or localized, unspecified site   . Irritable bowel syndrome   . GERD (gastroesophageal reflux disease)     h/o esophagitis  . Allergic rhinitis   . Insomnia   . Diverticulosis   . Alcohol abuse 2012  . Polysubstance abuse   . Seizures   . Alcoholic pancreatitis   . Periumbilical hernia 12/13/12  . Fatty liver 10/08/11  . Internal hemorrhoids 03/13/11  . Hiatal hernia 03/13/11  . Asthmatic bronchitis   . Frequent headaches   . 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 Prediabetes 03/01/2014  . Depression     Past Surgical History  Procedure Laterality Date  . Tibula surgery Right 2002   Family History:  Family History  Problem Relation Age of Onset  . Heart disease Mother   . Gallbladder disease Mother   . Nephrolithiasis Mother   . Arthritis Mother   . Hyperlipidemia Mother   . Stroke Mother   . Irritable bowel syndrome Mother   . Cancer Mother     vulva  . Colon cancer Neg Hx   . Hypertension Father     moth  . Heart disease Father   . Arthritis Father   . Irritable bowel syndrome Brother     x 2   Social History:  History  Alcohol Use No  Comment: no alcohol in one yr; drank 35 years alcohol, quit 02/2012     History  Drug Use No    Comment: (denies current use) benzos- ; hx of THC, cocaine, uppers/downers    History   Social History  . Marital Status: Divorced    Spouse Name: N/A    Number of Children: 1  . Years of Education: N/A   Occupational History  . unemployed--  apllying for disability    Social History Main Topics  . Smoking status: Former Smoker -- 1.50 packs/day for 18 years    Quit date: 08/28/1991  . Smokeless tobacco: Current User    Types: Snuff     Comment: currently uses snuff.11/17/13  . Alcohol Use: No     Comment: no alcohol in one yr; drank 35 years alcohol, quit 02/2012  . Drug Use: No     Comment: (denies current use) benzos- ; hx of THC, cocaine, uppers/downers  . Sexual Activity:    Partners: Female     Comment: no birth control   Other Topics Concern  . None   Social History Narrative   Lives w/ fiancee    Additional History:    Sleep: Fair  Appetite:  Fair    Musculoskeletal: Strength & Muscle Tone: within normal limits Gait & Station: normal Patient leans: N/A   Psychiatric Specialty Exam: Physical Exam  Vitals reviewed. Skin: Rash noted.    Review of Systems  Constitutional: Negative.   HENT: Negative.   Eyes: Negative.   Respiratory: Negative.   Cardiovascular: Negative.   Gastrointestinal: Negative.   Musculoskeletal: Negative.   Skin: Positive for itching and rash.  Neurological: Negative.   Endo/Heme/Allergies: Negative.   Psychiatric/Behavioral: The patient is nervous/anxious.     Blood pressure 115/62, pulse 78, temperature 98 F (36.7 C), temperature source Oral, resp. rate 17, height 5' 1.5" (1.562 m), weight 79.833 kg (176 lb), SpO2 100 %.Body mass index is 32.72 kg/(m^2).  General Appearance: Fairly Groomed  Patent attorney::  Fair  Speech:  Normal Rate  Volume:  Normal  Mood:  Anxious and Depressed improving  Affect:  Congruent  Thought Process:  Coherent  Orientation:  Full (Time, Place, and Person)  Thought Content:  Hallucinations: Tactile  Suicidal Thoughts:  No  Homicidal Thoughts:  No  Memory:  Immediate;   Fair Recent;   Fair Remote;   Fair  Judgement:  Impaired  Insight:  Shallow  Psychomotor Activity:  Normal  Concentration:  Fair  Recall:  [employer] of  Knowledge:Fair  Language: Fair  Akathisia:  No  Handed:  Right  AIMS (if indicated):     Assets:  Communication Skills  ADL's:  Intact  Cognition: WNL  Sleep:  Number of Hours: 6.25     Current Medications: Current Facility-Administered Medications  Medication Dose Route Frequency Provider Last Rate Last Dose  . acetaminophen (TYLENOL) tablet 650 mg  650 mg Oral Q6H PRN Kerry Hough, PA-C   650 mg at 09/15/14 2129  . albuterol (PROVENTIL HFA;VENTOLIN HFA) 108 (90 BASE) MCG/ACT inhaler 2 puff  2 puff Inhalation Q6H PRN Kerry Hough, PA-C   2 puff at 09/16/14 2156  . alum & mag hydroxide-simeth (MAALOX/MYLANTA) 200-200-20 MG/5ML suspension 30 mL  30 mL Oral Q4H PRN Kerry Hough, PA-C      . aspirin chewable tablet 81 mg  81 mg Oral Daily Kerry Hough, PA-C   81 mg at 09/18/14 0845  . atenolol (TENORMIN) tablet  100 mg  100 mg Oral Daily Kerry Hough, PA-C   100 mg at 09/18/14 0844  . risperiDONE (RISPERDAL M-TABS) disintegrating tablet 0.5 mg  0.5 mg Oral BH-qamhs Saramma Eappen, MD   0.5 mg at 09/18/14 0849   And  . benztropine (COGENTIN) tablet 0.5 mg  0.5 mg Oral BH-qamhs Saramma Eappen, MD   0.5 mg at 09/18/14 0845  . budesonide-formoterol (SYMBICORT) 160-4.5 MCG/ACT inhaler 2 puff  2 puff Inhalation BID Kerry Hough, PA-C   2 puff at 09/18/14 [phone]  . carbamazepine (TEGRETOL XR) 12 hr tablet 400 mg  400 mg Oral BID Kerry Hough, PA-C   400 mg at 09/18/14 0845  . cloNIDine (CATAPRES) tablet 0.1 mg  0.1 mg Oral BH-qamhs Jomarie Longs, MD       Followed by  . [START ON 09/21/2014] cloNIDine (CATAPRES) tablet 0.1 mg  0.1 mg Oral QAC breakfast Saramma Eappen, MD      . dicyclomine (BENTYL) tablet 20 mg  20 mg Oral Q6H PRN Jomarie Longs, MD      . fesoterodine (TOVIAZ) tablet 4 mg  4 mg Oral Daily Kerry Hough, PA-C   4 mg at 09/18/14 0845  . fluticasone (FLONASE) 50 MCG/ACT nasal spray 2 spray  2 spray Each Nare Daily Kerry Hough, PA-C   2 spray at 09/18/14 0846   . gabapentin (NEURONTIN) capsule 300 mg  300 mg Oral TID WC & HS Saramma Eappen, MD   300 mg at 09/18/14 1336  . hydrochlorothiazide (HYDRODIURIL) tablet 25 mg  25 mg Oral Daily Kerry Hough, PA-C   25 mg at 09/18/14 0844  . hydrOXYzine (ATARAX/VISTARIL) tablet 25 mg  25 mg Oral Q6H PRN Jomarie Longs, MD   25 mg at 09/17/14 2143  . ipratropium (ATROVENT) 0.03 % nasal spray 2 spray  2 spray Each Nare TID PRN Kerry Hough, PA-C      . loperamide (IMODIUM) capsule 2-4 mg  2-4 mg Oral PRN Jomarie Longs, MD      . loratadine (CLARITIN) tablet 10 mg  10 mg Oral Daily PRN Kerry Hough, PA-C      . magnesium hydroxide (MILK OF MAGNESIA) suspension 30 mL  30 mL Oral Daily PRN Kerry Hough, PA-C      . methocarbamol (ROBAXIN) tablet 500 mg  500 mg Oral Q8H PRN Jomarie Longs, MD   500 mg at 09/18/14 1338  . naproxen (NAPROSYN) tablet 500 mg  500 mg Oral BID PRN Jomarie Longs, MD   500 mg at 09/18/14 1338  . nicotine (NICODERM CQ - dosed in mg/24 hours) patch 21 mg  21 mg Transdermal Q0600 Jomarie Longs, MD   21 mg at 09/18/14 0845  . nystatin cream (MYCOSTATIN)   Topical BID Lindwood Qua, NP      . ondansetron (ZOFRAN-ODT) disintegrating tablet 4 mg  4 mg Oral Q6H PRN Jomarie Longs, MD      . pantoprazole (PROTONIX) EC tablet 40 mg  40 mg Oral Daily Kerry Hough, PA-C   40 mg at 09/18/14 0844  . predniSONE (DELTASONE) tablet 15 mg  15 mg Oral Q breakfast Kerry Hough, PA-C   15 mg at 09/18/14 0844  . senna-docusate (Senokot-S) tablet 1 tablet  1 tablet Oral QHS PRN Kerry Hough, PA-C      . sertraline (ZOLOFT) tablet 50 mg  50 mg Oral Daily Jomarie Longs, MD   50 mg at 09/18/14 0844  .  topiramate (TOPAMAX) tablet 25 mg  25 mg Oral Daily Kerry Hough, PA-C   25 mg at 09/18/14 0844  . traZODone (DESYREL) tablet 50 mg  50 mg Oral QHS Jomarie Longs, MD   50 mg at 09/17/14 2144    Lab Results:  No results found for this or any previous visit (from the past 48  hour(s)).  Physical Findings: AIMS: Facial and Oral Movements Muscles of Facial Expression: None, normal Lips and Perioral Area: None, normal Jaw: None, normal Tongue: None, normal,Extremity Movements Upper (arms, wrists, hands, fingers): None, normal Lower (legs, knees, ankles, toes): None, normal, Trunk Movements Neck, shoulders, hips: None, normal, Overall Severity Severity of abnormal movements (highest score from questions above): None, normal Incapacitation due to abnormal movements: None, normal Patient's awareness of abnormal movements (rate only patient's report): No Awareness, Dental Status Current problems with teeth and/or dentures?: No Does patient usually wear dentures?: No  CIWA:    COWS:  COWS Total Score: 2     Assessment; Patient with significant hx of dilaudid abuse as well as huffing ,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 Patient currently showing some prpogress on current medications.   Treatment Plan Summary: Daily contact with patient to assess and evaluate symptoms and progress in treatment, Medication management. Will continue Zoloft  50 mg po daily.  Will taper off Klonopin. Will continue Gabapentin to 300 mg po 4 times daily, patient reported this was started for pain. Will continue Tegretol as per recommendations from Neurology. Will start COWS/Clonidine protocol. Will add Risperdal 0.5 mg po bid for skin excoriations ,likely secondary to opioid withdrawal. Will add Trazodone 50 mg po qhs for sleep. Nystatin cream to groin folds.  Applied tpoical CSW will work on referral to substance abuse program.   Medical Decision Making:  Established Problem, Stable/Improving (1), New problem, with additional work up planned, Review of Psycho-Social Stressors (1), Review or order clinical lab tests (1), Review of Medication Regimen & Side Effects (2) and Review of New Medication or Change in Dosage (2) Problem Points:  Established problem,  stable/improving (1), New problem, with no additional work-up planned (3), Review of last therapy session (1) and Review of psycho-social stressors (1) Data Points:  Review or order medicine tests (1) Review and summation of old records (2) Review of medication regiment & side effects (2) Review of new medications or change in dosage (2)  AGUSTIN, SHEILA MAY, AGNP-BC 09/18/2014, 2:34 PM   I agreed with the findings, treatment and disposition plan of this patient. Kathryne Sharper, MD

## 2014-09-18 NOTE — Progress Notes (Signed)
.  Psychoeducational Group Note    Date: 09/18/2014 Time: 1000    Goal Setting Purpose of Group: To be able to set a goal that is measurable and that can be accomplished in one day Participation Level:  Active  Participation Quality:  Appropriate  Affect:  Appropriate  Cognitive:  Organized  Insight:  Improving  Engagement in Group:  Limited  Additional Comments:  Pt attended the group, paid attention and did provide feedback for a goal. Dione HousekeeperJudge, Pat Elicker A

## 2014-09-18 NOTE — [facility] Group Notes (Signed)
[facility] LCSW Group Therapy Note  09/18/2014 / 10:30 AM  Type of Therapy and Topic:  Group Therapy: Avoiding Self-Sabotaging and Enabling Behaviors  Participation Level:  Active  Therapeutic Goals: 1. Patient will identify one obstacle that relates to self-sabotage and enabling behaviors 2. Patient will identify one personal self-sabotaging or enabling behavior they did prior to admission 3. Patient able to establish a plan to change the above identified behavior they did prior to admission:  4. Patient will demonstrate ability to communicate their needs through discussion and/or role plays.   Summary of Patient Progress: The main focus of today's process group was to discuss what "self-sabotage" means and use Motivational Interviewing to discuss what benefits, negative or positive, were involved in a self-identified self-sabotaging behavior. We then talked about reasons the patient may want to change the behavior and current desire to change. The patient shared that he is looking forward to returning to family supports in [place] and getting married once there. He was engaged and shared frequently. He was able to process how his isolation only serves in him the short term and also processed some of his resentment toward people at a church he attended.     Therapeutic Modalities:   Cognitive Behavioral Therapy Person-Centered Therapy Motivational Interviewing   Martin Bernatherine C Gwendolyn Nishi, LCSW

## 2014-09-19 MED ORDER — NYSTATIN 100000 UNIT/GM EX POWD
Freq: Two times a day (BID) | CUTANEOUS | Status: DC
  Administered 2014-09-20 – 2014-09-22 (×3): via TOPICAL
  Filled 2014-09-19 (×3): qty 15

## 2014-09-19 NOTE — [facility] Group Notes (Signed)
[facility] Group Notes:  (Nursing/MHT/Case Management/Adjunct)  Date:  09/19/2014  Time:  1500pm  Type of Therapy:  Therapeutic Activity  Participation Level:  Active  Participation Quality:  Appropriate and Attentive  Affect:  Appropriate  Cognitive:  Alert and Appropriate  Insight:  Appropriate and Good  Engagement in Group:  Engaged  Modes of Intervention:  Activity  Summary of Progress/Problems: Patient attended group and responded appropriately but was tangential at times. Pt easily redirected.  Lendell CapriceGuthrie, Maninder Deboer A 09/19/2014, 3:38 PM

## 2014-09-19 NOTE — Plan of Care (Signed)
Problem: Alteration in mood; excessive anxiety as evidenced by: Goal: STG-Pt will report an absence of self-harm thoughts/actions (Patient will report an absence of self-harm thoughts or actions)  Outcome: Progressing Pt is safe and denies SI,intend or plan.

## 2014-09-19 NOTE — Progress Notes (Signed)
Patient ID: Martin Singh, male   DOB: [birth year], 49 y.o.   MRN: [id number] Patient ID: Martin Singh, male   DOB: Mar 12, 1966, 49 y.o.   MRN: [id number] [facility] MD Progress Note  09/19/2014 1:27 PM Martin Singh  MRN:  [id number] Subjective: Patient states that pruritus to his groin folds improved after the Nystatin cream was applied.  Otherwise, he feels better. Objective: Patient seen and chart reviewed.Patient discussed with treatment team. Patient today appears to be less anxious ,with overall improvement in his symptoms.  Patient reports sleep as improved.  Her eports that for a long time he was on Prozac and stopped working.  He feels that moods were better on the Zoloft.  Patient denies any SI/HI/AH/VH today.  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 Opioid use disorder, severe, dependence [F11.20] 09/16/2014  . Substance dependence, other [F19.20] 09/16/2014  . MDD (major depressive disorder), severe [F32.2] 09/15/2014  . Routine general medical examination at a health care facility [Z00.00] 09/07/2014  . Syncope and collapse [R55] 08/23/2014  . Syncope [R55] 08/23/2014  . Hypertension [I10] 08/23/2014  . Non-suppurative otitis media [H65.90] 08/17/2014  . Acute sinusitis [J01.90] 06/09/2014  . URI (upper respiratory infection) [J06.9] 05/06/2014  . Back pain [M54.9] 04/03/2014  . Prediabetes [R73.09] 03/01/2014  . Sleep apnea [G47.30] 01/11/2014  . 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 Dyspnea [R06.00] 11/17/2013  . IBS (irritable bowel syndrome) [K58.9] 04/02/2013  . Pernicious anemia [D51.0]  04/02/2013  . Anxiety and depression [F41.8] 03/17/2013  . Chronic daily headache [R51] 03/10/2013  . Alcoholism [F10.20] 01/20/2012  . Drug abuse and dependence [F19.20] 10/29/2011  . Chronic cough [R05] 09/11/2011  . Elev Transaminase/LDH [R74.0] 05/24/2008  . Hyperlipidemia [E78.5] 05/21/2008  . Hypertension, renal disease [I12.9, N18.9] 05/21/2008  . GERD [K21.9] 05/21/2008  . OSTEOARTHRITIS [M19.90] 05/21/2008   Total Time spent with patient: 30 minutes   Past Medical History:  Past Medical History  Diagnosis Date  . Hypertension   . Anxiety and depression   . Osteoarthrosis, unspecified whether generalized or localized, unspecified site   . Irritable bowel syndrome   . GERD (gastroesophageal reflux disease)     h/o esophagitis  . Allergic rhinitis   . Insomnia   . Diverticulosis   . Alcohol abuse 2012  . Polysubstance abuse   . Seizures   . Alcoholic pancreatitis   . Periumbilical hernia 12/13/12  . Fatty liver 10/08/11  . Internal hemorrhoids 03/13/11  . Hiatal hernia 03/13/11  . Asthmatic bronchitis   . Frequent headaches   . 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 Prediabetes 03/01/2014  . Depression     Past Surgical History  Procedure Laterality Date  . Tibula surgery Right 2002   Family History:  Family History  Problem Relation Age of Onset  . Heart disease Mother   . Gallbladder disease Mother   . Nephrolithiasis Mother   . Arthritis Mother   . Hyperlipidemia Mother   . Stroke Mother   . Irritable bowel syndrome Mother   . Cancer Mother     vulva  . Colon cancer Neg Hx   . Hypertension Father     moth  .  Heart disease Father   . Arthritis Father   . Irritable bowel syndrome Brother     x 2   Social History:  History  Alcohol Use No    Comment: no alcohol in one yr; drank 35 years alcohol, quit 02/2012     History  Drug Use No    Comment: (denies current use) benzos- ; hx of THC, cocaine, uppers/downers    History   Social History  .  Marital Status: Divorced    Spouse Name: N/A    Number of Children: 1  . Years of Education: N/A   Occupational History  . unemployed-- apllying for disability    Social History Main Topics  . Smoking status: Former Smoker -- 1.50 packs/day for 18 years    Quit date: 08/28/1991  . Smokeless tobacco: Current User    Types: Snuff     Comment: currently uses snuff.11/17/13  . Alcohol Use: No     Comment: no alcohol in one yr; drank 35 years alcohol, quit 02/2012  . Drug Use: No     Comment: (denies current use) benzos- ; hx of THC, cocaine, uppers/downers  . Sexual Activity:    Partners: Female     Comment: no birth control   Other Topics Concern  . None   Social History Narrative   Lives w/ fiancee    Additional History:    Sleep: Fair  Appetite:  Fair    Musculoskeletal: Strength & Muscle Tone: within normal limits Gait & Station: normal Patient leans: N/A   Psychiatric Specialty Exam: Physical Exam  Vitals reviewed. Skin: Rash noted.    ROS  Blood pressure 124/73, pulse 76, temperature 98.4 F (36.9 C), temperature source Oral, resp. rate 18, height 5' 1.5" (1.562 m), weight 79.833 kg (176 lb), SpO2 99 %.Body mass index is 32.72 kg/(m^2).  General Appearance: Fairly Groomed  Patent attorneyye Contact::  Fair  Speech:  Normal Rate  Volume:  Normal  Mood:  Anxious and Depressed improving  Affect:  Congruent  Thought Process:  Coherent  Orientation:  Full (Time, Place, and Person)  Thought Content:  Hallucinations: Tactile  Suicidal Thoughts:  No  Homicidal Thoughts:  No  Memory:  Immediate;   Fair Recent;   Fair Remote;   Fair  Judgement:  Impaired  Insight:  Shallow  Psychomotor Activity:  Normal  Concentration:  Fair  Recall:  FiservFair  Fund of Knowledge:Fair  Language: Fair  Akathisia:  No  Handed:  Right  AIMS (if indicated):     Assets:  Communication Skills  ADL's:  Intact  Cognition: WNL  Sleep:  Number of Hours: 6     Current Medications: Current  Facility-Administered Medications  Medication Dose Route Frequency Provider Last Rate Last Dose  . acetaminophen (TYLENOL) tablet 650 mg  650 mg Oral Q6H PRN Kerry HoughSpencer E Simon, PA-C   650 mg at 09/15/14 2129  . albuterol (PROVENTIL HFA;VENTOLIN HFA) 108 (90 BASE) MCG/ACT inhaler 2 puff  2 puff Inhalation Q6H PRN Kerry HoughSpencer E Simon, PA-C   2 puff at 09/18/14 1725  . alum & mag hydroxide-simeth (MAALOX/MYLANTA) 200-200-20 MG/5ML suspension 30 mL  30 mL Oral Q4H PRN Kerry HoughSpencer E Simon, PA-C      . aspirin chewable tablet 81 mg  81 mg Oral Daily Kerry HoughSpencer E Simon, PA-C   81 mg at 09/19/14 0754  . atenolol (TENORMIN) tablet 100 mg  100 mg Oral Daily Kerry HoughSpencer E Simon, PA-C   100 mg at 09/19/14 0753  .  risperiDONE (RISPERDAL M-TABS) disintegrating tablet 0.5 mg  0.5 mg Oral BH-qamhs Saramma Eappen, MD   0.5 mg at 09/19/14 0754   And  . benztropine (COGENTIN) tablet 0.5 mg  0.5 mg Oral BH-qamhs Saramma Eappen, MD   0.5 mg at 09/19/14 0914  . budesonide-formoterol (SYMBICORT) 160-4.5 MCG/ACT inhaler 2 puff  2 puff Inhalation BID Kerry Hough, PA-C   2 puff at 09/19/14 1610  . carbamazepine (TEGRETOL XR) 12 hr tablet 400 mg  400 mg Oral BID Kerry Hough, PA-C   400 mg at 09/19/14 0753  . cloNIDine (CATAPRES) tablet 0.1 mg  0.1 mg Oral BH-qamhs Saramma Eappen, MD   0.1 mg at 09/19/14 0758   Followed by  . [START ON 09/21/2014] cloNIDine (CATAPRES) tablet 0.1 mg  0.1 mg Oral QAC breakfast Saramma Eappen, MD      . dicyclomine (BENTYL) tablet 20 mg  20 mg Oral Q6H PRN Jomarie Longs, MD      . fesoterodine (TOVIAZ) tablet 4 mg  4 mg Oral Daily Kerry Hough, PA-C   4 mg at 09/19/14 0753  . fluticasone (FLONASE) 50 MCG/ACT nasal spray 2 spray  2 spray Each Nare Daily Kerry Hough, PA-C   2 spray at 09/19/14 [phone]  . gabapentin (NEURONTIN) capsule 300 mg  300 mg Oral TID WC & HS Saramma Eappen, MD   300 mg at 09/19/14 1201  . hydrochlorothiazide (HYDRODIURIL) tablet 25 mg  25 mg Oral Daily Kerry Hough, PA-C    25 mg at 09/19/14 0754  . hydrOXYzine (ATARAX/VISTARIL) tablet 25 mg  25 mg Oral Q6H PRN Jomarie Longs, MD   25 mg at 09/19/14 0803  . ipratropium (ATROVENT) 0.03 % nasal spray 2 spray  2 spray Each Nare TID PRN Kerry Hough, PA-C      . loperamide (IMODIUM) capsule 2-4 mg  2-4 mg Oral PRN Jomarie Longs, MD      . loratadine (CLARITIN) tablet 10 mg  10 mg Oral Daily PRN Kerry Hough, PA-C      . magnesium hydroxide (MILK OF MAGNESIA) suspension 30 mL  30 mL Oral Daily PRN Kerry Hough, PA-C   30 mL at 09/18/14 2153  . methocarbamol (ROBAXIN) tablet 500 mg  500 mg Oral Q8H PRN Jomarie Longs, MD   500 mg at 09/19/14 0802  . naproxen (NAPROSYN) tablet 500 mg  500 mg Oral BID PRN Jomarie Longs, MD   500 mg at 09/19/14 0802  . nicotine (NICODERM CQ - dosed in mg/24 hours) patch 21 mg  21 mg Transdermal Q0600 Jomarie Longs, MD   21 mg at 09/19/14 0752  . nystatin (MYCOSTATIN/NYSTOP) topical powder   Topical BID Velna Hatchet May Agustin, NP      . nystatin cream (MYCOSTATIN)   Topical BID Velna Hatchet May Agustin, NP      . ondansetron (ZOFRAN-ODT) disintegrating tablet 4 mg  4 mg Oral Q6H PRN Jomarie Longs, MD      . pantoprazole (PROTONIX) EC tablet 40 mg  40 mg Oral Daily Kerry Hough, PA-C   40 mg at 09/19/14 0753  . predniSONE (DELTASONE) tablet 15 mg  15 mg Oral Q breakfast Kerry Hough, PA-C   15 mg at 09/19/14 0754  . senna-docusate (Senokot-S) tablet 1 tablet  1 tablet Oral QHS PRN Kerry Hough, PA-C      . sertraline (ZOLOFT) tablet 50 mg  50 mg Oral Daily Saramma Eappen, MD   50 mg at  09/19/14 0753  . topiramate (TOPAMAX) tablet 25 mg  25 mg Oral Daily Kerry Hough, PA-C   25 mg at 09/19/14 0754  . traZODone (DESYREL) tablet 50 mg  50 mg Oral QHS Jomarie Longs, MD   50 mg at 09/18/14 2152    Lab Results:  No results found for this or any previous visit (from the past 48 hour(s)).  Physical Findings: AIMS: Facial and Oral Movements Muscles of Facial Expression: None,  normal Lips and Perioral Area: None, normal Jaw: None, normal Tongue: None, normal,Extremity Movements Upper (arms, wrists, hands, fingers): None, normal Lower (legs, knees, ankles, toes): None, normal, Trunk Movements Neck, shoulders, hips: None, normal, Overall Severity Severity of abnormal movements (highest score from questions above): None, normal Incapacitation due to abnormal movements: None, normal Patient's awareness of abnormal movements (rate only patient's report): No Awareness, Dental Status Current problems with teeth and/or dentures?: No Does patient usually wear dentures?: No  CIWA:    COWS:  COWS Total Score: 2     Assessment; Patient with significant hx of dilaudid abuse as well as huffing ,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 Patient currently showing some prpogress on current medications.   Treatment Plan Summary: Daily contact with patient to assess and evaluate symptoms and progress in treatment, Medication management. Will continue Zoloft  50 mg po daily.  Will taper off Klonopin. Will continue Gabapentin to 300 mg po 4 times daily, patient reported this was started for pain. Will continue Tegretol as per recommendations from Neurology. Will start COWS/Clonidine protocol. Will add Risperdal 0.5 mg po bid for skin excoriations ,likely secondary to opioid withdrawal. Will add Trazodone 50 mg po qhs for sleep. Nystatin cream to groin folds.  Applied topical. Will add Nystatin powder as patient requesting something more dry to apply to the irritation.   CSW will work on referral to substance abuse program.   Medical Decision Making:  Established Problem, Stable/Improving (1), New problem, with additional work up planned, Review of Psycho-Social Stressors (1), Review or order clinical lab tests (1), Review of Medication Regimen & Side Effects (2) and Review of New Medication or Change in Dosage (2) Problem Points:  Established problem,  stable/improving (1), New problem, with no additional work-up planned (3), Review of last therapy session (1) and Review of psycho-social stressors (1) Data Points:  Review or order medicine tests (1) Review and summation of old records (2) Review of medication regiment & side effects (2) Review of new medications or change in dosage (2)  AGUSTIN, SHEILA MAY, AGNP-BC 09/19/2014, 1:27 PM   I agreed with the findings, treatment and disposition plan of this patient. Kathryne Sharper, MD

## 2014-09-19 NOTE — [facility] Group Notes (Signed)
[facility] LCSW Group Therapy  09/19/2014   11:00 AM   Type of Therapy:  Group Therapy  Participation Level:  Active  Participation Quality:  Appropriate and Attentive  Affect:  Appropriate, Flat and Depressed  Cognitive:  Alert and Appropriate  Insight:  Developing/Improving and Engaged  Engagement in Therapy:  Developing/Improving and Engaged  Modes of Intervention:  Clarification, Confrontation, Discussion, Education, Exploration, Limit-setting, Orientation, Problem-solving, Rapport Building, Dance movement psychotherapisteality Testing, Socialization and Support  Summary of Progress/Problems: The main focus of today's process group was to identify the patient's current support system and decide on other supports that can be put in place.  An emphasis was placed on using counselor, doctor, therapy groups, 12-step groups, and problem-specific support groups to expand supports, as well as doing something different than has been done before.  Pt shared that his fiance, [facility], church and crisis numbers have been his support system.  Pt actively participated in group discussion and was supportive to peers.     Martin IvanChelsea Horton, LCSW 09/19/2014 1:04 PM .

## 2014-09-19 NOTE — Progress Notes (Signed)
D) Pt has attended the groups and interacts with his peers appropriately. Denies SI and HI. Rates his depression with a 0, hopelessness at a 0 and anxiety at a 2. Pt states he continues to have back pain and requests his pain medications and anxiety medications on a regular basis. Pt is pleasant and states he feels ready to go home. Affect is bright and mood improved. A) Given support, reassurance and praise along with encouragement. Provided with a 1:1. R) Denies SI and HI.

## 2014-09-20 DIAGNOSIS — F1490 Cocaine use, unspecified, uncomplicated: Secondary | ICD-10-CM

## 2014-09-20 DIAGNOSIS — F1990 Other psychoactive substance use, unspecified, uncomplicated: Secondary | ICD-10-CM

## 2014-09-20 NOTE — Progress Notes (Signed)
  DAR: Pt. Denies SI/HI and A/V Hallucinations. Patient reports mild pain for which PRN medications were administered. Patient reported mild anxiety as well and received PRN Vistaril for this. Patient reported their effectiveness on reassessment. Patient reports he slept good last night, appetite is good, energy level is normal, and concentration level is good. Patient rates his depression and hopelessness at 0/10 ad anxiety 2/10. Patient is only experiencing mild withdrawal symptoms at this time. Support and encouragement provided to the patient. Scheduled medications administered to patient physician's orders. Patient and writer spoke about the volume of medications that the patient is on. Patient reports, "I plan on getting off some of these medications after I get disability." Patient is receptive and cooperative in milieu. Patient is seen in the milieu interacting with peers and is attending groups. Q15 minute checks are maintained for safety.

## 2014-09-20 NOTE — Progress Notes (Signed)
D. Pt had been up and visible in milieu this evening, interacting appropriately with fellow peers in the milieu. Pt spoke about how he feels better now that he is on some medications. Pt spoke about how he was on prozac for so long that he felt that it wasn't working effectively any longer. Pt received all medications without incident. A. Support and encouragement provided. R. Safety maintained, will continue to monitor.

## 2014-09-20 NOTE — [facility] Group Notes (Signed)
[facility] LCSW Group Therapy  09/20/2014 1:15 pm  Type of Therapy: Process Group Therapy  Participation Level:  Active  Participation Quality:  Appropriate  Affect:  Flat  Cognitive:  Oriented  Insight:  Improving  Engagement in Group:  Limited  Engagement in Therapy:  Limited  Modes of Intervention:  Activity, Clarification, Education, Problem-solving and Support  Summary of Progress/Problems: Today's group addressed the issue of overcoming obstacles.  Patients were asked to identify their biggest obstacle post d/c that stands in the way of their on-going success, and then problem solve as to how to manage this.  Martin Singh talked abut overcoming alcohol abuse.  Of course, he mentioned nothing about stealing his wife's pain medication nor huffing.  He spent significant time giving another group member feedback about taking care of self first.  Martin Singh, Martin Singh 09/20/2014   3:21 PM

## 2014-09-20 NOTE — [facility] Group Notes (Signed)
[facility]   09/20/2014 10:20 AM  Participation Quality:  Engaged  Mood/Affect:  Appropriate  Depression Rating:  denies  Anxiety Rating:  denies  Thoughts of Suicide:  No Will you contract for safety?   NA  Current AVH:  No  Plan for Discharge/Comments:  States he had a good weekend.  Since his anti-depressant has been switched, he feels a lot more "stable" and is looking forward to d/cing soon.  States he is still interested in [facility] if that is a possibility.  If not, he still plans on following up with [facility] for IOP.   Transportation Means: SO  Supports: SO  Martin Singh, Martin Singh

## 2014-09-20 NOTE — Plan of Care (Signed)
Problem: Ineffective individual coping Goal: STG-Increase in ability to manage activities of daily living Outcome: Completed/Met Date Met:  09/20/14 Patient is taking care of ADL's as appropriate. Patient reports taking a shower this morning.

## 2014-09-20 NOTE — Progress Notes (Addendum)
Patient ID: MYSHAWN FORBUSH, male   DOB: 05/15/1966, 49 y.o.   MRN: [id number] Patient ID: BRIJ VANVACTOR, male   DOB: 04/25/1966, 49 y.o.   MRN: [id number] [facility] MD Progress Note  09/20/2014 2:58 PM VON LOWNES  MRN:  [id number] Subjective: Patient states "I am doing better today ,my anxiety is better.'   Objective: Patient seen and chart reviewed.Patient discussed with treatment team. Patient today appears to be less anxious ,denies any withdrawal sx. Over all improvement noted on current medications. Patient with significant hx of "huffing', opioid abuse ,on clonidine protocol.Patient with insight in to his substance abuse ,is motivated to get help.  Patient denies any SI/HI/AH/VH today.  Per staff ,patient has been attending group activities and is compliant on medications.   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 Opioid use disorder, severe, dependence [F11.20] 09/16/2014  . Substance dependence, other [F19.20] 09/16/2014  . MDD (major depressive disorder), severe [F32.2] 09/15/2014  . Prediabetes [R73.09] 03/01/2014  . Sleep apnea [G47.30] 01/11/2014  . 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 IBS (irritable bowel syndrome) [K58.9] 04/02/2013  . Pernicious anemia [D51.0] 04/02/2013  . Anxiety and depression [F41.8] 03/17/2013  . Elev Transaminase/LDH [R74.0] 05/24/2008  . Hyperlipidemia [E78.5] 05/21/2008  . Hypertension, renal disease [I12.9, N18.9] 05/21/2008  . GERD [K21.9] 05/21/2008  . OSTEOARTHRITIS [M19.90] 05/21/2008   Total Time spent with patient: 30 minutes   Past Medical History:  Past  Medical History  Diagnosis Date  . Hypertension   . Anxiety and depression   . Osteoarthrosis, unspecified whether generalized or localized, unspecified site   . Irritable bowel syndrome   . GERD (gastroesophageal reflux disease)     h/o esophagitis  . Allergic rhinitis   . Insomnia   . Diverticulosis   . Alcohol abuse 2012  . Polysubstance abuse   . Seizures   . Alcoholic pancreatitis   . Periumbilical hernia 12/13/12  . Fatty liver 10/08/11  . Internal hemorrhoids 03/13/11  . Hiatal hernia 03/13/11  . Asthmatic bronchitis   . Frequent headaches   . 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 Prediabetes 03/01/2014  . Depression     Past Surgical History  Procedure Laterality Date  . Tibula surgery Right 2002   Family History:  Family History  Problem Relation Age of Onset  . Heart disease Mother   . Gallbladder disease Mother   . Nephrolithiasis Mother   . Arthritis Mother   . Hyperlipidemia Mother   . Stroke Mother   . Irritable bowel syndrome Mother   . Cancer Mother     vulva  . Colon cancer Neg Hx   . Hypertension Father     moth  . Heart disease Father   . Arthritis Father   . Irritable bowel syndrome Brother     x 2   Social History:  History  Alcohol Use No    Comment: no alcohol in one yr; drank 35 years alcohol, quit 02/2012     History  Drug Use No    Comment: (denies current use) benzos- ; hx of THC, cocaine, uppers/downers    History   Social History  . Marital Status: Divorced  Spouse Name: N/A    Number of Children: 1  . Years of Education: N/A   Occupational History  . unemployed-- apllying for disability    Social History Main Topics  . Smoking status: Former Smoker -- 1.50 packs/day for 18 years    Quit date: 08/28/1991  . Smokeless tobacco: Current User    Types: Snuff     Comment: currently uses snuff.11/17/13  . Alcohol Use: No     Comment: no alcohol in one yr; drank 35 years alcohol, quit 02/2012  . Drug Use: No     Comment:  (denies current use) benzos- ; hx of THC, cocaine, uppers/downers  . Sexual Activity:    Partners: Female     Comment: no birth control   Other Topics Concern  . None   Social History Narrative   Lives w/ fiancee    Additional History:    Sleep: Fair  Appetite:  Fair    Musculoskeletal: Strength & Muscle Tone: within normal limits Gait & Station: normal Patient leans: N/A   Psychiatric Specialty Exam: Physical Exam  Vitals reviewed. Skin: Rash noted.    ROS  Blood pressure 124/77, pulse 68, temperature 98.4 F (36.9 C), temperature source Oral, resp. rate 16, height 5' 1.5" (1.562 m), weight 79.833 kg (176 lb), SpO2 99 %.Body mass index is 32.72 kg/(m^2).  General Appearance: Fairly Groomed  Patent attorney::  Fair  Speech:  Normal Rate  Volume:  Normal  Mood:  Anxious and Depressed improving  Affect:  Congruent  Thought Process:  Coherent  Orientation:  Full (Time, Place, and Person)  Thought Content:  WDL  Suicidal Thoughts:  No  Homicidal Thoughts:  No  Memory:  Immediate;   Fair Recent;   Fair Remote;   Fair  Judgement:  Impaired  Insight:  Shallow  Psychomotor Activity:  Normal  Concentration:  Fair  Recall:  [employer] of Knowledge:Fair  Language: Fair  Akathisia:  No  Handed:  Right  AIMS (if indicated):     Assets:  Communication Skills  ADL's:  Intact  Cognition: WNL  Sleep:  Number of Hours: 6.5     Current Medications: Current Facility-Administered Medications  Medication Dose Route Frequency Provider Last Rate Last Dose  . acetaminophen (TYLENOL) tablet 650 mg  650 mg Oral Q6H PRN Kerry Hough, PA-C   650 mg at 09/15/14 2129  . albuterol (PROVENTIL HFA;VENTOLIN HFA) 108 (90 BASE) MCG/ACT inhaler 2 puff  2 puff Inhalation Q6H PRN Kerry Hough, PA-C   2 puff at 09/18/14 1725  . alum & mag hydroxide-simeth (MAALOX/MYLANTA) 200-200-20 MG/5ML suspension 30 mL  30 mL Oral Q4H PRN Kerry Hough, PA-C      . aspirin chewable tablet 81 mg   81 mg Oral Daily Kerry Hough, PA-C   81 mg at 09/20/14 0911  . atenolol (TENORMIN) tablet 100 mg  100 mg Oral Daily Kerry Hough, PA-C   100 mg at 09/20/14 0910  . risperiDONE (RISPERDAL M-TABS) disintegrating tablet 0.5 mg  0.5 mg Oral BH-qamhs Antinette Keough, MD   0.5 mg at 09/20/14 0909   And  . benztropine (COGENTIN) tablet 0.5 mg  0.5 mg Oral BH-qamhs Avik Leoni, MD   0.5 mg at 09/20/14 0910  . budesonide-formoterol (SYMBICORT) 160-4.5 MCG/ACT inhaler 2 puff  2 puff Inhalation BID Kerry Hough, PA-C   2 puff at 09/20/14 2841  . carbamazepine (TEGRETOL XR) 12 hr tablet 400 mg  400 mg Oral  BID Kerry Hough, PA-C   400 mg at 09/20/14 0911  . [START ON 09/21/2014] cloNIDine (CATAPRES) tablet 0.1 mg  0.1 mg Oral QAC breakfast Soul Deveney, MD      . dicyclomine (BENTYL) tablet 20 mg  20 mg Oral Q6H PRN Jomarie Longs, MD      . fesoterodine (TOVIAZ) tablet 4 mg  4 mg Oral Daily Kerry Hough, PA-C   4 mg at 09/20/14 0908  . fluticasone (FLONASE) 50 MCG/ACT nasal spray 2 spray  2 spray Each Nare Daily Kerry Hough, PA-C   2 spray at 09/20/14 8295  . gabapentin (NEURONTIN) capsule 300 mg  300 mg Oral TID WC & HS Jomarie Longs, MD   300 mg at 09/20/14 1208  . hydrochlorothiazide (HYDRODIURIL) tablet 25 mg  25 mg Oral Daily Kerry Hough, PA-C   25 mg at 09/20/14 0910  . hydrOXYzine (ATARAX/VISTARIL) tablet 25 mg  25 mg Oral Q6H PRN Jomarie Longs, MD   25 mg at 09/20/14 0905  . ipratropium (ATROVENT) 0.03 % nasal spray 2 spray  2 spray Each Nare TID PRN Kerry Hough, PA-C      . loperamide (IMODIUM) capsule 2-4 mg  2-4 mg Oral PRN Jomarie Longs, MD      . loratadine (CLARITIN) tablet 10 mg  10 mg Oral Daily PRN Kerry Hough, PA-C      . magnesium hydroxide (MILK OF MAGNESIA) suspension 30 mL  30 mL Oral Daily PRN Kerry Hough, PA-C   30 mL at 09/18/14 2153  . methocarbamol (ROBAXIN) tablet 500 mg  500 mg Oral Q8H PRN Jomarie Longs, MD   500 mg at 09/20/14 0905  .  naproxen (NAPROSYN) tablet 500 mg  500 mg Oral BID PRN Jomarie Longs, MD   500 mg at 09/20/14 0905  . nicotine (NICODERM CQ - dosed in mg/24 hours) patch 21 mg  21 mg Transdermal Q0600 Jomarie Longs, MD   21 mg at 09/20/14 0907  . nystatin (MYCOSTATIN/NYSTOP) topical powder   Topical BID Velna Hatchet May Agustin, NP      . nystatin cream (MYCOSTATIN)   Topical BID Velna Hatchet May Agustin, NP      . ondansetron (ZOFRAN-ODT) disintegrating tablet 4 mg  4 mg Oral Q6H PRN Jomarie Longs, MD      . pantoprazole (PROTONIX) EC tablet 40 mg  40 mg Oral Daily Kerry Hough, PA-C   40 mg at 09/20/14 0910  . predniSONE (DELTASONE) tablet 15 mg  15 mg Oral Q breakfast Kerry Hough, PA-C   15 mg at 09/20/14 6213  . senna-docusate (Senokot-S) tablet 1 tablet  1 tablet Oral QHS PRN Kerry Hough, PA-C      . sertraline (ZOLOFT) tablet 50 mg  50 mg Oral Daily Jomarie Longs, MD   50 mg at 09/20/14 0909  . topiramate (TOPAMAX) tablet 25 mg  25 mg Oral Daily Kerry Hough, PA-C   25 mg at 09/19/14 0754  . traZODone (DESYREL) tablet 50 mg  50 mg Oral QHS Jomarie Longs, MD   50 mg at 09/19/14 2157    Lab Results:  No results found for this or any previous visit (from the past 48 hour(s)).  Physical Findings: AIMS: Facial and Oral Movements Muscles of Facial Expression: None, normal Lips and Perioral Area: None, normal Jaw: None, normal Tongue: None, normal,Extremity Movements Upper (arms, wrists, hands, fingers): None, normal Lower (legs, knees, ankles, toes): None, normal, Trunk Movements Neck, shoulders,  hips: None, normal, Overall Severity Severity of abnormal movements (highest score from questions above): None, normal Incapacitation due to abnormal movements: None, normal Patient's awareness of abnormal movements (rate only patient's report): No Awareness, Dental Status Current problems with teeth and/or dentures?: No Does patient usually wear dentures?: No  CIWA:    COWS:  COWS Total Score: 1      Assessment; Patient with significant hx of dilaudid abuse as well as huffing ,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 Patient currently showing some progress on current medications.   Treatment Plan Summary: Daily contact with patient to assess and evaluate symptoms and progress in treatment, Medication management. Will continue Zoloft  50 mg po daily.  Will taper off Klonopin. Will continue Gabapentin to 300 mg po 4 times daily, patient reported this was started for pain. Will continue Tegretol as per recommendations from Neurology. Will continue COWS/Clonidine protocol. Will continue Risperdal 0.5 mg po bid for skin excoriations ,likely secondary to opioid withdrawal. Will continue Trazodone 50 mg po qhs for sleep. Nystatin cream to groin folds.  Applied topical. Will continue Nystatin powder as patient requesting something more dry to apply to the irritation.   CSW will work on referral to substance abuse program.   Medical Decision Making:  Established Problem, Stable/Improving (1), Review of Psycho-Social Stressors (1), Review or order clinical lab tests (1), Review of Medication Regimen & Side Effects (2) and Review of New Medication or Change in Dosage (2) Problem Points:  Established problem, stable/improving (1), Review of last therapy session (1) and Review of psycho-social stressors (1) Data Points:  Review or order medicine tests (1) Review and summation of old records (2) Review of medication regiment & side effects (2) Review of new medications or change in dosage (2)  Margaretmary Prisk MD 09/20/2014, 2:58 PM

## 2014-09-20 NOTE — Progress Notes (Signed)
Adult Psychoeducational Group Note  Date:  09/20/2014 Time:  9:17 PM  Group Topic/Focus:  Wrap-Up Group:   The focus of this group is to help patients review their daily goal of treatment and discuss progress on daily workbooks.  Participation Level:  Active  Participation Quality:  Sharing  Affect:  Appropriate  Cognitive:  Alert  Insight: Good  Engagement in Group:  Engaged  Modes of Intervention:  Discussion  Additional Comments: Patient stated that he has a wonderful day today. Patient says that he was able to deal with his anger better, therefore he thinks that his medications are working. The patient stated that his goal was to keep his temper under control.  [facility] Center IncJOHNSON,TAWANA 09/20/2014, 9:17 PM

## 2014-09-20 NOTE — Tx Team (Addendum)
  Interdisciplinary Treatment Plan Update   Date Reviewed:  09/20/2014  Time Reviewed:  8:32 AM  Progress in Treatment:   Attending groups: Yes Participating in groups: Yes Taking medication as prescribed: Yes  Tolerating medication: Yes Family/Significant other contact made: Yes  Patient understands diagnosis: Yes  Discussing patient identified problems/goals with staff: Yes  See initial care plan Medical problems stabilized or resolved: Yes Denies suicidal/homicidal ideation: Yes  In tx team Patient has not harmed self or others: Yes  For review of initial/current patient goals, please see plan of care.  Estimated Length of Stay:  Likely d/c tomorrow  Reason for Continuation of Hospitalization:   New Problems/Goals identified:  N/A  Discharge Plan or Barriers:   [facility]   Additional Comments:  Attendees:  Signature: Ivin BootySarama Eappen, MD 09/20/2014 8:32 AM   Signature: Richelle Itood Tavi Hoogendoorn, LCSW 09/20/2014 8:32 AM  Signature: Fransisca KaufmannLaura Davis, NP 09/20/2014 8:32 AM  Signature:  09/20/2014 8:32 AM  Signature: Liborio NixonPatrice White, RN 09/20/2014 8:32 AM  Signature:  09/20/2014 8:32 AM  Signature:   09/20/2014 8:32 AM  Signature:    Signature:    Signature:    Signature:    Signature:    Signature:      Scribe for Treatment Team:   Richelle Itood Camdyn Laden, LCSW  09/20/2014 8:32 AM

## 2014-09-21 NOTE — Progress Notes (Signed)
D: Patient alert and pleasant on approach. Pt reports he will continue outpatient at [facility]. Pt thought process is organized and behavior is appropriate. Pt attended evening wrap up group and engaged in discussion.   A: Met with pt 1:1. Medications administered as prescribed. Writer encouraged pt to discuss feelings. Pt encouraged to come to staff with any questions or concerns.   R: Patient is safe on the unit. He is complaint with medications and denies any adverse reaction. Pt denies SI/HI/AVH.

## 2014-09-21 NOTE — Progress Notes (Signed)
D: Patient alert and pleasant on approach. Pt reports he will continue outpatient at [facility]. Pt reports he is doing well with decrease anxiety. Pt thought process is organized and behavior is appropriate. Pt denies SI/HI/AVH. Pt attended evening wrap up group and engaged in discussion.   A: Met with pt 1:1. Medications administered as prescribed. Writer encouraged pt to discuss feelings. Pt encouraged to come to staff with any questions or concerns.   R: Patient is safe on the unit. He is complaint with medications and denies any adverse reaction.      

## 2014-09-21 NOTE — [facility] Group Notes (Signed)
[facility] Group Notes:  (Counselor/Nursing/MHT/Case Management/Adjunct)  09/21/2014 1:15PM  Type of Therapy:  Group Therapy  Participation Level:  Active  Participation Quality:  Appropriate  Affect:  Flat  Cognitive:  Oriented  Insight:  Improving  Engagement in Group:  Limited  Engagement in Therapy:  Limited  Modes of Intervention:  Discussion, Exploration and Socialization  Summary of Progress/Problems: The topic for group was balance in life.  Pt participated in the discussion about when their life was in balance and out of balance and how this feels.  Pt discussed ways to get back in balance and short term goals they can work on to get where they want to be. Martin Singh identified his vulnerability as his addiction.  "It gets me in trouble because when I relapse, the consequences are limited to jail, hospital or death.  And none of them are good."  [facility] and hoping that he gets in there tomorrow.   Martin Singh, Martin Singh 09/21/2014 2:50 PM

## 2014-09-21 NOTE — Progress Notes (Signed)
Patient ID: DHYAAN STRAKA, male   DOB: 1965/09/29, 49 y.o.   MRN: [id number] Patient ID: SUMIT TOROSIAN, male   DOB: 1966/04/19, 49 y.o.   MRN: [id number] [facility] MD Progress Note  09/21/2014 1:06 PM SHAUGHN BJORKLUND  MRN:  [id number] Subjective: Patient states "I am ok ,I sleep better now."   Objective: Patient seen and chart reviewed.Patient discussed with treatment team. Patient today seems to be improving. He is very motivated to get help with his subsance abuse issues. Patient denies SI/HI/AH/VH. Patient reports wanting to stay away from illicit drugs as well as reports never wanting to borrow dilaudid from his wife. Patient reports not wanting to go back to where he was in the past ,when he abused a lot of drugs "has used everything ,everykind of drug in the past.   Per staff ,patient has been attending group activities and is compliant on medications.   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 Opioid use disorder, severe, dependence [F11.20] 09/16/2014  . Substance dependence, other [F19.20] 09/16/2014  . MDD (major depressive disorder), severe [F32.2] 09/15/2014  . Anxiety and depression [F41.8] 03/17/2013  . Elev Transaminase/LDH [R74.0] 05/24/2008  . Hyperlipidemia [E78.5] 05/21/2008  . Hypertension, renal disease [I12.9, N18.9] 05/21/2008  . GERD [K21.9] 05/21/2008   Total Time spent with patient: 30 minutes   Past Medical History:  Past Medical History  Diagnosis Date  . Hypertension   . Anxiety and depression   . Osteoarthrosis, unspecified whether generalized or localized, unspecified site   .  Irritable bowel syndrome   . GERD (gastroesophageal reflux disease)     h/o esophagitis  . Allergic rhinitis   . Insomnia   . Diverticulosis   . Alcohol abuse 2012  . Polysubstance abuse   . Seizures   . Alcoholic pancreatitis   . Periumbilical hernia 12/13/12  . Fatty liver 10/08/11  . Internal hemorrhoids 03/13/11  . Hiatal hernia 03/13/11  . Asthmatic bronchitis   . Frequent headaches   . 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 Prediabetes 03/01/2014  . Depression     Past Surgical History  Procedure Laterality Date  . Tibula surgery Right 2002   Family History:  Family History  Problem Relation Age of Onset  . Heart disease Mother   . Gallbladder disease Mother   . Nephrolithiasis Mother   . Arthritis Mother   . Hyperlipidemia Mother   . Stroke Mother   . Irritable bowel syndrome Mother   . Cancer Mother     vulva  . Colon cancer Neg Hx   . Hypertension Father     moth  . Heart disease Father   . Arthritis Father   . Irritable bowel syndrome Brother     x 2   Social History:  History  Alcohol Use No    Comment: no alcohol in one yr; drank 35 years alcohol, quit 02/2012     History  Drug Use No    Comment: (denies current use) benzos- ; hx of THC, cocaine, uppers/downers    History   Social History  . Marital Status: Divorced    Spouse Name: N/A    Number of Children: 1  .  Years of Education: N/A   Occupational History  . unemployed-- apllying for disability    Social History Main Topics  . Smoking status: Former Smoker -- 1.50 packs/day for 18 years    Quit date: 08/28/1991  . Smokeless tobacco: Current User    Types: Snuff     Comment: currently uses snuff.11/17/13  . Alcohol Use: No     Comment: no alcohol in one yr; drank 35 years alcohol, quit 02/2012  . Drug Use: No     Comment: (denies current use) benzos- ; hx of THC, cocaine, uppers/downers  . Sexual Activity:    Partners: Female     Comment: no birth control   Other Topics Concern  . None    Social History Narrative   Lives w/ fiancee    Additional History:    Sleep: Fair  Appetite:  Fair    Musculoskeletal: Strength & Muscle Tone: within normal limits Gait & Station: normal Patient leans: N/A   Psychiatric Specialty Exam: Physical Exam  Vitals reviewed. Skin: Rash noted.    ROS  Blood pressure 117/84, pulse 72, temperature 98.5 F (36.9 C), temperature source Oral, resp. rate 20, height 5' 1.5" (1.562 m), weight 79.833 kg (176 lb), SpO2 99 %.Body mass index is 32.72 kg/(m^2).  General Appearance: Fairly Groomed  Patent attorney::  Fair  Speech:  Normal Rate  Volume:  Normal  Mood:  Anxious and Depressed improving  Affect:  Congruent  Thought Process:  Coherent  Orientation:  Full (Time, Place, and Person)  Thought Content:  WDL  Suicidal Thoughts:  No  Homicidal Thoughts:  No  Memory:  Immediate;   Fair Recent;   Fair Remote;   Fair  Judgement:  Impaired  Insight:  Shallow  Psychomotor Activity:  Normal  Concentration:  Fair  Recall:  [employer] of Knowledge:Fair  Language: Fair  Akathisia:  No  Handed:  Right  AIMS (if indicated):     Assets:  Communication Skills  ADL's:  Intact  Cognition: WNL  Sleep:  Number of Hours: 6.5     Current Medications: Current Facility-Administered Medications  Medication Dose Route Frequency Provider Last Rate Last Dose  . acetaminophen (TYLENOL) tablet 650 mg  650 mg Oral Q6H PRN Kerry Hough, PA-C   650 mg at 09/21/14 0836  . albuterol (PROVENTIL HFA;VENTOLIN HFA) 108 (90 BASE) MCG/ACT inhaler 2 puff  2 puff Inhalation Q6H PRN Kerry Hough, PA-C   2 puff at 09/18/14 1725  . alum & mag hydroxide-simeth (MAALOX/MYLANTA) 200-200-20 MG/5ML suspension 30 mL  30 mL Oral Q4H PRN Kerry Hough, PA-C      . aspirin chewable tablet 81 mg  81 mg Oral Daily Kerry Hough, PA-C   81 mg at 09/21/14 0824  . atenolol (TENORMIN) tablet 100 mg  100 mg Oral Daily Kerry Hough, PA-C   100 mg at 09/21/14 1308  .  risperiDONE (RISPERDAL M-TABS) disintegrating tablet 0.5 mg  0.5 mg Oral BH-qamhs Kennidy Lamke, MD   0.5 mg at 09/21/14 0824   And  . benztropine (COGENTIN) tablet 0.5 mg  0.5 mg Oral BH-qamhs Alfonzia Woolum, MD   0.5 mg at 09/21/14 0824  . budesonide-formoterol (SYMBICORT) 160-4.5 MCG/ACT inhaler 2 puff  2 puff Inhalation BID Kerry Hough, PA-C   2 puff at 09/21/14 0825  . carbamazepine (TEGRETOL XR) 12 hr tablet 400 mg  400 mg Oral BID Kerry Hough, PA-C   400 mg at 09/21/14 6578  .  cloNIDine (CATAPRES) tablet 0.1 mg  0.1 mg Oral QAC breakfast Jomarie Longs, MD   0.1 mg at 09/21/14 0639  . fesoterodine (TOVIAZ) tablet 4 mg  4 mg Oral Daily Kerry Hough, PA-C   4 mg at 09/21/14 4098  . fluticasone (FLONASE) 50 MCG/ACT nasal spray 2 spray  2 spray Each Nare Daily Kerry Hough, PA-C   2 spray at 09/21/14 0800  . gabapentin (NEURONTIN) capsule 300 mg  300 mg Oral TID WC & HS Jomarie Longs, MD   300 mg at 09/21/14 1208  . hydrochlorothiazide (HYDRODIURIL) tablet 25 mg  25 mg Oral Daily Kerry Hough, PA-C   25 mg at 09/21/14 1191  . ipratropium (ATROVENT) 0.03 % nasal spray 2 spray  2 spray Each Nare TID PRN Kerry Hough, PA-C      . loratadine (CLARITIN) tablet 10 mg  10 mg Oral Daily PRN Kerry Hough, PA-C      . magnesium hydroxide (MILK OF MAGNESIA) suspension 30 mL  30 mL Oral Daily PRN Kerry Hough, PA-C   30 mL at 09/18/14 2153  . nicotine (NICODERM CQ - dosed in mg/24 hours) patch 21 mg  21 mg Transdermal Q0600 Jomarie Longs, MD   21 mg at 09/21/14 0825  . nystatin (MYCOSTATIN/NYSTOP) topical powder   Topical BID Lindwood Qua, NP      . nystatin cream (MYCOSTATIN)   Topical BID Velna Hatchet May Agustin, NP      . pantoprazole (PROTONIX) EC tablet 40 mg  40 mg Oral Daily Kerry Hough, PA-C   40 mg at 09/21/14 4782  . predniSONE (DELTASONE) tablet 15 mg  15 mg Oral Q breakfast Kerry Hough, PA-C   15 mg at 09/21/14 9562  . senna-docusate (Senokot-S) tablet 1  tablet  1 tablet Oral QHS PRN Kerry Hough, PA-C      . sertraline (ZOLOFT) tablet 50 mg  50 mg Oral Daily Jomarie Longs, MD   50 mg at 09/21/14 0824  . topiramate (TOPAMAX) tablet 25 mg  25 mg Oral Daily Kerry Hough, PA-C   25 mg at 09/21/14 0825  . traZODone (DESYREL) tablet 50 mg  50 mg Oral QHS Jomarie Longs, MD   50 mg at 09/20/14 2122    Lab Results:  No results found for this or any previous visit (from the past 48 hour(s)).  Physical Findings: AIMS: Facial and Oral Movements Muscles of Facial Expression: None, normal Lips and Perioral Area: None, normal Jaw: None, normal Tongue: None, normal,Extremity Movements Upper (arms, wrists, hands, fingers): None, normal Lower (legs, knees, ankles, toes): None, normal, Trunk Movements Neck, shoulders, hips: None, normal, Overall Severity Severity of abnormal movements (highest score from questions above): None, normal Incapacitation due to abnormal movements: None, normal Patient's awareness of abnormal movements (rate only patient's report): No Awareness, Dental Status Current problems with teeth and/or dentures?: No Does patient usually wear dentures?: No  CIWA:    COWS:  COWS Total Score: 3     Assessment; Patient with significant hx of dilaudid abuse as well as huffing ,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 Patient currently showing some progress on current medications.   Treatment Plan Summary: Daily contact with patient to assess and evaluate symptoms and progress in treatment, Medication management. Will continue Zoloft  50 mg po daily.  Will continue Gabapentin to 300 mg po 4 times daily, patient reported this was started for pain. Will continue Tegretol as per recommendations from  Neurology. Will continue COWS/Clonidine protocol. Will continue Risperdal 0.5 mg po bid for skin excoriations ,likely secondary to opioid withdrawal. Will continue Trazodone 50 mg po qhs for sleep. Nystatin cream to groin  folds.  Applied topical. Will continue Nystatin powder as patient requesting something more dry to apply to the irritation.   CSW will work on referral to substance abuse program.[facility]wait[facility] hear back from [facility].   Medical Decision Making:  Established Problem, Stable/Improving (1), Review of Psycho-Social Stressors (1), Review or order clinical lab tests (1), Review of Medication Regimen & Side Effects (2) and Review of New Medication or Change in Dosage (2) Problem Points:  Established problem, stable/improving (1), Review of last therapy session (1) and Review of psycho-social stressors (1) Data Points:  Review and summation of old records (2) Review of medication regiment & side effects (2)  Jonathandavid Marlett MD 09/21/2014, 1:06 PM

## 2014-09-21 NOTE — Progress Notes (Signed)
D: Patient has anxious affect and mood. He reported on the self inventory sheet that his sleep, appetite and ability to concentrate are all good and energy level is normal. Patient rates depression/feelings of hopelessness "0" and anxiety "2". He's actively participating in group sessions throughout the day, visible in the milieu and interactive with peers on the hall. Adheres to medication regimen.  A: Support and encouragement provided to patient. Administered medications per ordering MD. Monitor Q15 minute checks for safety.  R: Patient receptive. Denies SI/HI/AVH. Patient remains safe on the unit.

## 2014-09-22 ENCOUNTER — Encounter (HOSPITAL_COMMUNITY): Payer: Self-pay | Admitting: Registered Nurse

## 2014-09-22 MED ORDER — ATENOLOL 100 MG PO TABS: 100.0000 mg | ORAL_TABLET | Freq: Every day | ORAL | Status: DC

## 2014-09-22 MED ORDER — GABAPENTIN 300 MG PO CAPS: 300.0000 mg | ORAL_CAPSULE | Freq: Three times a day (TID) | ORAL | Status: DC

## 2014-09-22 MED ORDER — SERTRALINE HCL 50 MG PO TABS: 50.0000 mg | ORAL_TABLET | Freq: Every day | ORAL | Status: DC

## 2014-09-22 MED ORDER — TRAZODONE HCL 50 MG PO TABS: 50.0000 mg | ORAL_TABLET | Freq: Every day | ORAL | Status: DC

## 2014-09-22 MED ORDER — PREDNISONE 5 MG PO TABS: 15.0000 mg | ORAL_TABLET | Freq: Every day | ORAL | Status: DC

## 2014-09-22 MED ORDER — NYSTATIN 100000 UNIT/GM EX POWD: CUTANEOUS | Status: DC

## 2014-09-22 MED ORDER — PREDNISONE 10 MG PO TABS
15.0000 mg | ORAL_TABLET | Freq: Every day | ORAL | Status: DC
  Filled 2014-09-22 (×2): qty 21

## 2014-09-22 MED ORDER — ALBUTEROL SULFATE HFA 108 (90 BASE) MCG/ACT IN AERS: 2.0000 | INHALATION_SPRAY | Freq: Four times a day (QID) | RESPIRATORY_TRACT | Status: DC | PRN

## 2014-09-22 MED ORDER — RISPERIDONE 0.5 MG PO TBDP: 0.5000 mg | ORAL_TABLET | ORAL | Status: DC

## 2014-09-22 MED ORDER — BENZTROPINE MESYLATE 0.5 MG PO TABS: 0.5000 mg | ORAL_TABLET | ORAL | Status: DC

## 2014-09-22 MED ORDER — LORATADINE 10 MG PO TABS
10.0000 mg | ORAL_TABLET | Freq: Every day | ORAL | Status: DC
Start: 2014-09-23 — End: 2014-09-22
  Filled 2014-09-22: qty 14
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
## 2014-09-22 NOTE — [facility] Suicide Risk Assessment (Signed)
[facility] Discharge Suicide Risk Assessment   Demographic Factors:  Male and Caucasian  Total Time spent with patient: 30 minutes  Musculoskeletal: Strength & Muscle Tone: within normal limits Gait & Station: normal Patient leans: N/A  Psychiatric Specialty Exam: Physical Exam  Review of Systems  Constitutional: Negative.   HENT: Negative.   Respiratory: Negative.   Cardiovascular: Negative.   Genitourinary: Negative.   Musculoskeletal: Negative.   Skin: Negative.   Neurological: Negative.   Psychiatric/Behavioral: Positive for substance abuse.    Blood pressure 131/76, pulse 70, temperature 98.6 F (37 C), temperature source Oral, resp. rate 20, height 5' 1.5" (1.562 m), weight 79.833 kg (176 lb), SpO2 99 %.Body mass index is 32.72 kg/(m^2).  General Appearance: Casual  Eye Contact::  Good  Speech:  Clear and Coherent  Volume:  Normal  Mood:  Euthymic  Affect:  Congruent  Thought Process:  Coherent  Orientation:  Full (Time, Place, and Person)  Thought Content:  WDL  Suicidal Thoughts:  No  Homicidal Thoughts:  No  Memory:  Immediate;   Fair Recent;   Fair Remote;   Fair  Judgement:  Fair  Insight:  Fair  Psychomotor Activity:  Normal  Concentration:  Fair  Recall:  [employer] of Knowledge:Fair  Language: Fair  Akathisia:  No  Handed:  Right  AIMS (if indicated):     Assets:  Communication Skills Desire for Improvement  Sleep:  Number of Hours: 6.75  Cognition: WNL  ADL's:  Intact   Have you used any form of tobacco in the last 30 days? (Cigarettes, Smokeless Tobacco, Cigars, and/or Pipes): Yes  Has this patient used any form of tobacco in the last 30 days? (Cigarettes, Smokeless Tobacco, Cigars, and/or Pipes) Yes, Prescription not provided because: patient offered nicotine replacement.patient is not ready yet. Patient is going to a substance abuse program   Mental Status Per Nursing Assessment::   On Admission:  Self-harm thoughts  Current Mental Status by  Physician: patient denies SI/HI/AH/VH  Loss Factors: NA  Historical Factors: Impulsivity  Risk Reduction Factors:   Living with another person, especially a relative and Positive social support  Continued Clinical Symptoms:  Alcohol/Substance Abuse/Dependencies Previous Psychiatric Diagnoses and Treatments  Cognitive Features That Contribute To Risk:  Polarized thinking    Suicide Risk:  Acute risk is Minimal: No identifiable suicidal ideation. 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 Opioid use disorder, severe, dependence [F11.20] 09/16/2014  . Substance dependence, other [F19.20] 09/16/2014  . MDD (major depressive disorder), severe [F32.2] 09/15/2014  . Anxiety and depression [F41.8] 03/17/2013  . Elev Transaminase/LDH [R74.0] 05/24/2008  . Hyperlipidemia [E78.5] 05/21/2008  . Hypertension, renal disease [I12.9, N18.9] 05/21/2008  . GERD [K21.9] 05/21/2008    Follow-up Information    Follow up with [facility] On 09/22/2014.   Why:  Arrive no later than 3 for your screening for admission   Contact information:   [address]  [place]  [phone]      Please follow up.  Why:  [facility] [phone], or [facility], which has SAIOP [phone] [facility]      Plan Of Care/Follow-up recommendations:  Activity:  NO RESTRICTIONS Diet:  REGULAR Tests:  AS NEEDED Other:  PATIENT IS BEING  TRANSFERRED TO [facility]  Is patient on multiple antipsychotic therapies at discharge:  No   Has Patient had three or more failed trials of antipsychotic monotherapy by history:  No  Recommended Plan for Multiple Antipsychotic Therapies: NA    Janith Nielson md 09/22/2014, 10:04 AM

## 2014-09-22 NOTE — Progress Notes (Signed)
  [facility] :  Will you be returning to the same living situation after discharge:  No. [facility] At discharge, do you have transportation home?: Yes,  SO Do you have the ability to pay for your medications: Yes,  MCD  Release of information consent forms completed and in the chart;  Patient's signature needed at discharge.  Patient to Follow up at: Follow-up Information    Follow up with [facility] On 09/22/2014.   Why:  Arrive no later than 3 for your screening for admission   Contact information:   [address]  [place]  [phone]      Please follow up.   Why:  [facility] [phone], or [facility], which has SAIOP [phone] [facility]      Patient denies SI/HI: Yes,  yes    Safety Planning and Suicide Prevention discussed: Yes,  yes  Has patient been referred to the Quitline?: Yes, faxed on 1/27  Ida Rogueorth, Dailon Sheeran B 09/22/2014, 10:38 AM

## 2014-09-22 NOTE — Plan of Care (Signed)
Problem: Alteration in mood; excessive anxiety as evidenced by: Goal: STG-Patient can identify triggers for anxiety Outcome: Progressing Pt report decrease anxiety level and looking forward to discharge tomorrow.

## 2014-09-22 NOTE — Progress Notes (Signed)
Discharge Note: Discharge instructions/prescriptions/medication samples given to patient. Patient verbalized understanding of discharge instructions and prescriptions. Returned belongings to patient. Denies SI/HI/AVH. Patient d/c without incident to the front lobby and transported home with his girlfriend.

## 2014-09-26 ENCOUNTER — Emergency Department (HOSPITAL_COMMUNITY): Payer: No Typology Code available for payment source

## 2014-09-26 ENCOUNTER — Emergency Department (HOSPITAL_COMMUNITY)
Admission: EM | Admit: 2014-09-26 | Discharge: 2014-09-26 | Disposition: A | Discharge status: Home / Self Care | Payer: No Typology Code available for payment source | Attending: Emergency Medicine | Admitting: Emergency Medicine

## 2014-09-26 DIAGNOSIS — X58XXXA Exposure to other specified factors, initial encounter: Secondary | ICD-10-CM | POA: Insufficient documentation

## 2014-09-26 DIAGNOSIS — Y9289 Other specified places as the place of occurrence of the external cause: Secondary | ICD-10-CM | POA: Insufficient documentation

## 2014-09-26 DIAGNOSIS — Y998 Other external cause status: Secondary | ICD-10-CM | POA: Insufficient documentation

## 2014-09-26 DIAGNOSIS — I10 Essential (primary) hypertension: Secondary | ICD-10-CM | POA: Insufficient documentation

## 2014-09-26 DIAGNOSIS — Z79899 Other long term (current) drug therapy: Secondary | ICD-10-CM | POA: Insufficient documentation

## 2014-09-26 DIAGNOSIS — G4700 Insomnia, unspecified: Secondary | ICD-10-CM | POA: Insufficient documentation

## 2014-09-26 DIAGNOSIS — Z7982 Long term (current) use of aspirin: Secondary | ICD-10-CM | POA: Insufficient documentation

## 2014-09-26 DIAGNOSIS — F329 Major depressive disorder, single episode, unspecified: Secondary | ICD-10-CM | POA: Insufficient documentation

## 2014-09-26 DIAGNOSIS — M1990 Unspecified osteoarthritis, unspecified site: Secondary | ICD-10-CM | POA: Insufficient documentation

## 2014-09-26 DIAGNOSIS — F322 Major depressive disorder, single episode, severe without psychotic features: Secondary | ICD-10-CM | POA: Insufficient documentation

## 2014-09-26 DIAGNOSIS — R0602 Shortness of breath: Secondary | ICD-10-CM

## 2014-09-26 DIAGNOSIS — G40909 Epilepsy, unspecified, not intractable, without status epilepticus: Secondary | ICD-10-CM | POA: Insufficient documentation

## 2014-09-26 DIAGNOSIS — J45909 Unspecified asthma, uncomplicated: Secondary | ICD-10-CM | POA: Insufficient documentation

## 2014-09-26 DIAGNOSIS — T578X2A Toxic effect of other specified inorganic substances, intentional self-harm, initial encounter: Secondary | ICD-10-CM | POA: Insufficient documentation

## 2014-09-26 DIAGNOSIS — Z87891 Personal history of nicotine dependence: Secondary | ICD-10-CM | POA: Insufficient documentation

## 2014-09-26 DIAGNOSIS — Z7951 Long term (current) use of inhaled steroids: Secondary | ICD-10-CM | POA: Insufficient documentation

## 2014-09-26 DIAGNOSIS — Z7952 Long term (current) use of systemic steroids: Secondary | ICD-10-CM | POA: Insufficient documentation

## 2014-09-26 DIAGNOSIS — K219 Gastro-esophageal reflux disease without esophagitis: Secondary | ICD-10-CM | POA: Insufficient documentation

## 2014-09-26 DIAGNOSIS — Y9389 Activity, other specified: Secondary | ICD-10-CM | POA: Insufficient documentation

## 2014-09-26 LAB — I-STAT CHEM 8, ED
BUN: 7 mg/dL (ref 6–23)
Calcium, Ion: 1.17 mmol/L (ref 1.12–1.23)
Chloride: 95 mmol/L — ABNORMAL LOW (ref 96–112)
Creatinine, Ser: 0.8 mg/dL (ref 0.50–1.35)
GLUCOSE: 108 mg/dL — AB (ref 70–99)
HEMATOCRIT: 31 % — AB (ref 39.0–52.0)
HEMOGLOBIN: 10.5 g/dL — AB (ref 13.0–17.0)
Potassium: 3.8 mmol/L (ref 3.5–5.1)
Sodium: 131 mmol/L — ABNORMAL LOW (ref 135–145)
TCO2: 21 mmol/L (ref 0–100)

## 2014-09-26 LAB — COMPREHENSIVE METABOLIC PANEL
ALT: 31 U/L (ref 0–53)
ANION GAP: 6 (ref 5–15)
AST: 22 U/L (ref 0–37)
Albumin: 4 g/dL (ref 3.5–5.2)
Alkaline Phosphatase: 89 U/L (ref 39–117)
BILIRUBIN TOTAL: 0.3 mg/dL (ref 0.3–1.2)
BUN: 10 mg/dL (ref 6–23)
CHLORIDE: 95 mmol/L — AB (ref 96–112)
CO2: 27 mmol/L (ref 19–32)
CREATININE: 0.75 mg/dL (ref 0.50–1.35)
Calcium: 9 mg/dL (ref 8.4–10.5)
Glucose, Bld: 105 mg/dL — ABNORMAL HIGH (ref 70–99)
POTASSIUM: 3.6 mmol/L (ref 3.5–5.1)
Sodium: 128 mmol/L — ABNORMAL LOW (ref 135–145)
TOTAL PROTEIN: 6.8 g/dL (ref 6.0–8.3)

## 2014-09-26 LAB — RAPID URINE DRUG SCREEN, HOSP PERFORMED
Amphetamines: NOT DETECTED
BARBITURATES: NOT DETECTED
Benzodiazepines: NOT DETECTED
COCAINE: NOT DETECTED
OPIATES: NOT DETECTED
Tetrahydrocannabinol: NOT DETECTED

## 2014-09-26 LAB — CBC
HCT: 33.8 % — ABNORMAL LOW (ref 39.0–52.0)
Hemoglobin: 11.3 g/dL — ABNORMAL LOW (ref 13.0–17.0)
MCH: 28.4 pg (ref 26.0–34.0)
MCHC: 33.4 g/dL (ref 30.0–36.0)
MCV: 84.9 fL (ref 78.0–100.0)
Platelets: 292 10*3/uL (ref 150–400)
RBC: 3.98 MIL/uL — ABNORMAL LOW (ref 4.22–5.81)
RDW: 13.3 % (ref 11.5–15.5)
WBC: 6 10*3/uL (ref 4.0–10.5)

## 2014-09-26 LAB — ACETAMINOPHEN LEVEL: Acetaminophen (Tylenol), Serum: <10 ug/mL — ABNORMAL LOW (ref 10–30)

## 2014-09-26 LAB — SALICYLATE LEVEL: Salicylate Lvl: <4 mg/dL (ref 2.8–20.0)

## 2014-09-26 LAB — ETHANOL

## 2014-09-26 MED ORDER — IBUPROFEN 800 MG PO TABS
800.0000 mg | ORAL_TABLET | Freq: Once | ORAL | Status: AC
  Administered 2014-09-26: 800 mg via ORAL
  Filled 2014-09-26: qty 1

## 2014-09-26 MED ORDER — SODIUM CHLORIDE 0.9 % IV BOLUS (SEPSIS)
1000.0000 mL | Freq: Once | INTRAVENOUS | Status: AC
  Administered 2014-09-26: 1000 mL via INTRAVENOUS

## 2014-09-26 MED ORDER — METOCLOPRAMIDE HCL 10 MG PO TABS: 10.0000 mg | ORAL_TABLET | Freq: Once | ORAL | Status: DC

## 2014-09-26 NOTE — ED Notes (Signed)
Patient states that he started out with a full can of duster and has been inhaling since 1500 yesterday
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
## 2014-09-26 NOTE — ED Notes (Signed)
EKG given to EDP Oni for review 

## 2014-09-26 NOTE — ED Notes (Signed)
Patient wanded by security. 

## 2014-09-26 NOTE — ED Notes (Signed)
Patient belongings:  Long sleeve thermal shirt in camo print Plain white short sleeve shirt Green colored Star Wars short sleeve shirt Blue and yellow colored jacket One pair of camo print sandals One pair of white socks One pair of camo print pajama pants
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
## 2014-09-26 NOTE — ED Notes (Signed)
Patient aware that he has been placed up for DC Patient informed of need to make and keep f/u appointment as instructed--agreed and v/u Patient able to contract for safety with this nurse at time of DC Patient belongings given back to patient

## 2014-09-26 NOTE — ED Notes (Signed)
TTS complete Patient transported to CXR Patient in NAD upon leaving ED for testing

## 2014-09-26 NOTE — Discharge Instructions (Signed)
Major Depressive Disorder Martin Singh, you were given resources to follow-up for your depression. You were also given fluids for dehydration. Drink plenty of fluids every day to stay hydrated. Please call a primary care physician within 3 days for management. If your symptoms worsen come back to the emergency department immediately. Thank you. Major depressive disorder is a mental illness. It also may be called clinical depression or unipolar depression. Major depressive disorder usually causes feelings of sadness, hopelessness, or helplessness. Some people with this disorder do not feel particularly sad but lose interest in doing things they used to enjoy (anhedonia). Major depressive disorder also can cause physical symptoms. It can interfere with work, school, relationships, and other normal everyday activities. The disorder varies in severity but is longer lasting and more serious than the sadness we all feel from time to time in our lives. Major depressive disorder often is triggered by stressful life events or major life changes. Examples of these triggers include divorce, loss of your job or home, a move, and the death of a family member or close friend. Sometimes this disorder occurs for no obvious reason at all. People who have family members with major depressive disorder or bipolar disorder are at higher risk for developing this disorder, with or without life stressors. Major depressive disorder can occur at any age. It may occur just once in your life (single episode major depressive disorder). It may occur multiple times (recurrent major depressive disorder). SYMPTOMS People with major depressive disorder have either anhedonia or depressed mood on nearly a daily basis for at least 2 weeks or longer. Symptoms of depressed mood include:  Feelings of sadness (blue or down in the dumps) or emptiness.  Feelings of hopelessness or helplessness.  Tearfulness or episodes of crying (may be observed by  others).  Irritability (children and adolescents). In addition to depressed mood or anhedonia or both, people with this disorder have at least four of the following symptoms:  Difficulty sleeping or sleeping too much.   Significant change (increase or decrease) in appetite or weight.   Lack of energy or motivation.  Feelings of guilt and worthlessness.   Difficulty concentrating, remembering, or making decisions.  Unusually slow movement (psychomotor retardation) or restlessness (as observed by others).   Recurrent wishes for death, recurrent thoughts of self-harm (suicide), or a suicide attempt. People with major depressive disorder commonly have persistent negative thoughts about themselves, other people, and the world. People with severe major depressive disorder may experiencedistorted beliefs or perceptions about the world (psychotic delusions). They also may see or hear things that are not real (psychotic hallucinations). DIAGNOSIS Major depressive disorder is diagnosed through an assessment by your health care provider. Your health care provider will ask aboutaspects of your daily life, such as mood,sleep, and appetite, to see if you have the diagnostic symptoms of major depressive disorder. Your health care provider may ask about your medical history and use of alcohol or drugs, including prescription medicines. Your health care provider also may do a physical exam and blood work. This is because certain medical conditions and the use of certain substances can cause major depressive disorder-like symptoms (secondary depression). Your health care provider also may refer you to a mental health specialist for further evaluation and treatment. TREATMENT It is important to recognize the symptoms of major depressive disorder and seek treatment. The following treatments can be prescribed for this disorder:   Medicine. Antidepressant medicines usually are prescribed. Antidepressant  medicines are thought to correct chemical  imbalances in the brain that are commonly associated with major depressive disorder. Other types of medicine may be added if the symptoms do not respond to antidepressant medicines alone or if psychotic delusions or hallucinations occur.  Talk therapy. Talk therapy can be helpful in treating major depressive disorder by providing support, education, and guidance. Certain types of talk therapy also can help with negative thinking (cognitive behavioral therapy) and with relationship issues that trigger this disorder (interpersonal therapy). A mental health specialist can help determine which treatment is best for you. Most people with major depressive disorder do well with a combination of medicine and talk therapy. Treatments involving electrical stimulation of the brain can be used in situations with extremely severe symptoms or when medicine and talk therapy do not work over time. These treatments include electroconvulsive therapy, transcranial magnetic stimulation, and vagal nerve stimulation. Document Released: 12/08/2012 Document Revised: 12/28/2013 Document Reviewed: 12/08/2012 [facility] Patient Information 2015 [place], [place]. This information is not intended to replace advice given to you by your health care provider. Make sure you discuss any questions you have with your health care provider. Dehydration, Adult Dehydration means your body does not have as much fluid as it needs. Your kidneys, brain, and heart will not work properly without the right amount of fluids and salt.  HOME CARE  Ask your doctor how to replace body fluid losses (rehydrate).  Drink enough fluids to keep your pee (urine) clear or pale yellow.  Drink small amounts of fluids often if you feel sick to your stomach (nauseous) or throw up (vomit).  Eat like you normally do.  Avoid:  Foods or drinks high in sugar.  Bubbly (carbonated) drinks.  Juice.  Very hot or cold  fluids.  Drinks with caffeine.  Fatty, greasy foods.  Alcohol.  Tobacco.  Eating too much.  Gelatin desserts.  Wash your hands to avoid spreading germs (bacteria, viruses).  Only take medicine as told by your doctor.  Keep all doctor visits as told. GET HELP RIGHT AWAY IF:   You cannot drink something without throwing up.  You get worse even with treatment.  Your vomit has blood in it or looks greenish.  Your poop (stool) has blood in it or looks black and tarry.  You have not peed in 6 to 8 hours.  You pee a small amount of very dark pee.  You have a fever.  You pass out (faint).  You have belly (abdominal) pain that gets worse or stays in one spot (localizes).  You have a rash, stiff neck, or bad headache.  You get easily annoyed, sleepy, or are hard to wake up.  You feel weak, dizzy, or very thirsty. MAKE SURE YOU:   Understand these instructions.  Will watch your condition.  Will get help right away if you are not doing well or get worse. Document Released: 06/09/2009 Document Revised: 11/05/2011 Document Reviewed: 04/02/2011 [facility] OrangeExitCare Patient Information 2015 [place], [place]. This information is not intended to replace advice given to you by your health care provider. Make sure you discuss any questions you have with your health care provider.

## 2014-09-26 NOTE — ED Notes (Signed)
Patient reports that his girlfriend called EMS when she found out that he was "huffing" Patient admits to SI due to approaching anniversary of mother's death  [facility] but has not yet followed up as instructed Patient reports lightheadedness and dizziness, but states that this is due to inhaling dusting chemical Patient denies N/V/D Patient denies SOB, tightness in throat or other respiratory issues Patient able to speak in full complete sentences without difficulty upon assessment and is able to handle secretions RR WNL--even and unlabored with equal rise and fall of chest, no cough present O2 saturation 97% on room air Patient alert and oriented x 4 Patient in NAD

## 2014-09-26 NOTE — BH Assessment (Signed)
[id number]:  Consult with Dr. Mora Bellmanni about Patient.    0234:  Call to schedule tele assessment 2x, tele assessment machine placed in Patient's room at 0255.  0312:  Consulted with Maryjean Mornharles Kober, Extender about Patient's disposition.  Per Rito EhrlichExtender Kober;  Patient does not meet inpatient criteria.  Patient can follow his desired plan to contact [facility] on 09-27-2014 about new bed date.  [id number]:  Provided Dr. Mora Bellmanni with Patient's disposition.
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
## 2014-09-26 NOTE — ED Notes (Signed)
Poison Control called and made aware that patient has arrived to ED Recommendations: Cardiac monitoring for at least four hours EKG

## 2014-09-26 NOTE — ED Notes (Signed)
TTS consult in progress Will medicate patient when eval is complete

## 2014-09-26 NOTE — ED Provider Notes (Signed)
CSN: [id number]     Arrival date & time 09/26/14  0142 History   First MD Initiated Contact with Patient 09/26/14 0203     Chief Complaint  Patient presents with  . Suicidal  . Ingestion    Multipurpose duster     (Consider location/radiation/quality/duration/timing/severity/associated sxs/prior Treatment) HPI  Martin Singh is a 49 y.o. male with past medical history of depression coming in with suicidal ideation. Patient has had several stresses in his life involving his family. Tonight he was inhaling aerosol from a can. His fiance saw this and called EMS. Patient currently denies any suicidal ideation. He denies ingestion of any other intoxicants. This is his first suicide attempt. Patient states he was just stressed, he denies any hallucinations. Patient has no medical complaints, he's had no fevers recent infections or pain in his body. He only currently complains of a headache. There is no shortness of breath from the inhaling. Patient has no further complaints.  10 Systems reviewed and are negative for acute change except as noted in the HPI.      Past Medical History  Diagnosis Date  . Hypertension   . Anxiety and depression   . Osteoarthrosis, unspecified whether generalized or localized, unspecified site   . Irritable bowel syndrome   . GERD (gastroesophageal reflux disease)     h/o esophagitis  . Allergic rhinitis   . Insomnia   . Diverticulosis   . Alcohol abuse 2012  . Polysubstance abuse   . Seizures   . Alcoholic pancreatitis   . Periumbilical hernia 12/13/12  . Fatty liver 10/08/11  . Internal hemorrhoids 03/13/11  . Hiatal hernia 03/13/11  . Asthmatic bronchitis   . Frequent headaches   . 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 Prediabetes 03/01/2014  . Depression    Past Surgical History  Procedure Laterality Date  . Tibula surgery Right 2002   Family History  Problem Relation Age of Onset  . Heart disease Mother   . Gallbladder disease Mother   .  Nephrolithiasis Mother   . Arthritis Mother   . Hyperlipidemia Mother   . Stroke Mother   . Irritable bowel syndrome Mother   . Cancer Mother     vulva  . Colon cancer Neg Hx   . Hypertension Father     moth  . Heart disease Father   . Arthritis Father   . Irritable bowel syndrome Brother     x 2   History  Substance Use Topics  . Smoking status: Former Smoker -- 1.50 packs/day for 18 years    Quit date: 08/28/1991  . Smokeless tobacco: Current User    Types: Snuff     Comment: currently uses snuff.11/17/13  . Alcohol Use: No     Comment: no alcohol in one yr; drank 35 years alcohol, quit 02/2012    Review of Systems    Allergies  Codeine; Benazepril; Ativan; and Corticosteroids  Home Medications   Prior to Admission medications   Medication Sig Start Date End Date Taking? Authorizing Provider  albuterol (PROVENTIL HFA;VENTOLIN HFA) 108 (90 BASE) MCG/ACT inhaler Inhale 2 puffs into the lungs every 6 (six) hours as needed for wheezing or shortness of breath. 09/22/14  Yes Shuvon Rankin, NP  aspirin 81 MG chewable tablet Chew 81 mg by mouth daily.   Yes Historical Provider, MD  atenolol (TENORMIN) 100 MG tablet Take 1 tablet (100 mg total) by mouth daily. For high blood pressure 09/22/14  Yes Shuvon Rankin, NP  benztropine (  COGENTIN) 0.5 MG tablet Take 1 tablet (0.5 mg total) by mouth 2 (two) times daily in the am and at bedtime.. For drug induced tremors 09/22/14  Yes Shuvon Rankin, NP  budesonide-formoterol (SYMBICORT) 160-4.5 MCG/ACT inhaler Inhale 2 puffs into the lungs 2 (two) times daily. 09/07/14  Yes Tammy S Parrett, NP  carbamazepine (TEGRETOL XR) 200 MG 12 hr tablet Take 2 tablets (400 mg total) by mouth 2 (two) times daily. 07/16/14  Yes Adam Gus Rankin, DO  cloNIDine (CATAPRES) 0.1 MG tablet Take 1 tablet by mouth 2 (two) times daily. 07/28/14  Yes Historical Provider, MD  esomeprazole (NEXIUM) 40 MG capsule Take 1 capsule (40 mg total) by mouth daily at 12 noon.  07/15/14  Yes Leslye Peer, MD  fesoterodine (TOVIAZ) 4 MG TB24 tablet Take 4 mg by mouth daily.   Yes Historical Provider, MD  gabapentin (NEURONTIN) 300 MG capsule Take 1 capsule (300 mg total) by mouth 4 (four) times daily -  with meals and at bedtime. 09/22/14  Yes Shuvon Rankin, NP  hydrochlorothiazide (HYDRODIURIL) 25 MG tablet Take 1 tablet (25 mg total) by mouth daily. 08/17/14  Yes Jeanine Luz, FNP  ibuprofen (ADVIL,MOTRIN) 200 MG tablet Take 600 mg by mouth daily as needed for moderate pain.   Yes Historical Provider, MD  ipratropium (ATROVENT) 0.03 % nasal spray Two sprays each nostril 2-3 times daily prn. Patient taking differently: Two sprays each nostril 2-3 times daily prn. 06/23/14  Yes Leslye Peer, MD  nystatin (MYCOSTATIN/NYSTOP) 100000 UNIT/GM POWD Apply topically to affected area 2 (two) times daily for yeast infection 09/22/14  Yes Shuvon Rankin, NP  omalizumab (XOLAIR) 150 MG injection Inject 300 mg into the skin every 28 (twenty-eight) days.   Yes Historical Provider, MD  predniSONE (DELTASONE) 5 MG tablet Take 3 tablets (15 mg total) by mouth daily with breakfast. 09/22/14  Yes Shuvon Rankin, NP  PROZAC 20 MG capsule Take 60 mg by mouth at bedtime. 07/12/14  Yes Historical Provider, MD  risperiDONE (RISPERDAL M-TABS) 0.5 MG disintegrating tablet Take 1 tablet (0.5 mg total) by mouth 2 (two) times daily in the am and at bedtime.. For mood stabilizer 09/22/14  Yes Shuvon Rankin, NP  sertraline (ZOLOFT) 50 MG tablet Take 1 tablet (50 mg total) by mouth daily. For depression 09/22/14  Yes Shuvon Rankin, NP  topiramate (TOPAMAX) 25 MG tablet Take 1 tablet (25 mg total) by mouth daily. 05/19/14  Yes Adam Gus Rankin, DO  traZODone (DESYREL) 50 MG tablet Take 1 tablet (50 mg total) by mouth at bedtime. For sleep 09/22/14  Yes Shuvon Rankin, NP  cetirizine (ZYRTEC) 10 MG tablet Take 1 tablet (10 mg total) by mouth daily. Patient taking differently: Take 10 mg by mouth daily as  needed for allergies.  06/29/14   Leslye Peer, MD  mometasone (NASONEX) 50 MCG/ACT nasal spray Place 2 sprays into the nose daily. 06/09/14   Leslye Peer, MD   BP 140/84 mmHg  Pulse 72  Temp(Src) 98.3 F (36.8 C) (Oral)  Resp 20  SpO2 97% Physical Exam  Constitutional: He is oriented to person, place, and time. Vital signs are normal. He appears well-developed and well-nourished.  Non-toxic appearance. He does not appear ill. No distress.  HENT:  Head: Normocephalic and atraumatic.  Nose: Nose normal.  Mouth/Throat: Oropharynx is clear and moist. No oropharyngeal exudate.  Eyes: Conjunctivae and EOM are normal. Pupils are equal, round, and reactive to light. No scleral icterus.  Neck: Normal  range of motion. Neck supple. No tracheal deviation, no edema, no erythema and normal range of motion present. No thyroid mass and no thyromegaly present.  Cardiovascular: Normal rate, regular rhythm, S1 normal, S2 normal, normal heart sounds, intact distal pulses and normal pulses.  Exam reveals no gallop and no friction rub.   No murmur heard. Pulses:      Radial pulses are 2+ on the right side, and 2+ on the left side.       Dorsalis pedis pulses are 2+ on the right side, and 2+ on the left side.  Pulmonary/Chest: Effort normal and breath sounds normal. No respiratory distress. He has no wheezes. He has no rhonchi. He has no rales.  Abdominal: Soft. Normal appearance and bowel sounds are normal. He exhibits no distension, no ascites and no mass. There is no hepatosplenomegaly. There is no tenderness. There is no rebound, no guarding and no CVA tenderness.  Musculoskeletal: Normal range of motion. He exhibits no edema or tenderness.  Lymphadenopathy:    He has no cervical adenopathy.  Neurological: He is alert and oriented to person, place, and time. He has normal strength. No cranial nerve deficit or sensory deficit. He exhibits normal muscle tone.  Skin: Skin is warm, dry and intact. No  petechiae and no rash noted. He is not diaphoretic. No erythema. No pallor.  Psychiatric: He has a normal mood and affect. His behavior is normal. Judgment normal.  Nursing note and vitals reviewed.   ED Course  Procedures (including critical care time) Labs Review Labs Reviewed  ACETAMINOPHEN LEVEL - Abnormal; Notable for the following:    Acetaminophen (Tylenol), Serum <10.0 (*)    All other components within normal limits  CBC - Abnormal; Notable for the following:    RBC 3.98 (*)    Hemoglobin 11.3 (*)    HCT 33.8 (*)    All other components within normal limits  COMPREHENSIVE METABOLIC PANEL - Abnormal; Notable for the following:    Sodium 128 (*)    Chloride 95 (*)    Glucose, Bld 105 (*)    All other components within normal limits  I-STAT CHEM 8, ED - Abnormal; Notable for the following:    Sodium 131 (*)    Chloride 95 (*)    Glucose, Bld 108 (*)    Hemoglobin 10.5 (*)    HCT 31.0 (*)    All other components within normal limits  ETHANOL  SALICYLATE LEVEL  URINE RAPID DRUG SCREEN (HOSP PERFORMED)    Imaging Review No results found.   EKG Interpretation   Date/Time:  Sunday September 26 2014 01:59:37 EST Ventricular Rate:  70 PR Interval:  144 QRS Duration: 99 QT Interval:  400 QTC Calculation: 432 R Axis:   38 Text Interpretation:  Sinus rhythm No significant change since last  tracing Confirmed by Erroll Luna [phone]) on 09/26/2014 2:05:22 AM      MDM   Final diagnoses:  Shortness of breath    Patient presents emergency department for suicidal ideation. He states he was stressed and his thoughts have resolved. I do not believe this patient warrants inpatient psychiatric services. I have consulted TTS for outpatient resources. Patient has no medical complaints, laboratory studies and urine was ordered by nursing staff and I do not believe it was clinically indicated. He was given Motrin and Reglan for pain relief. I anticipate discharge.  TTS  has recommended patient be discharged with resources. I agree with this plan. Metabolic panel reveals  low sodium and chloride. He is given a 1 L of fluid IV bolus. We'll repeat electrolytes at that time.  After 1 L of fluid, sodium is 131, chloride 95. He was given education on continuing fluid replacement at home and staying well-hydrated. Patient continues to deny SI, his vital signs remain within his normal limits and he is safe for discharge.   Tomasita Crumble, MD 09/26/14 [phone]

## 2014-09-26 NOTE — ED Notes (Signed)
Patient aware of need for urine specimen.

## 2014-09-26 NOTE — ED Notes (Signed)
Tele psych machine at bedside 

## 2014-09-26 NOTE — ED Notes (Signed)
Dr. Oni at bedside. 

## 2014-09-27 ENCOUNTER — Other Ambulatory Visit: Payer: Self-pay | Admitting: Family

## 2014-09-27 ENCOUNTER — Encounter: Payer: Self-pay | Admitting: Family

## 2014-09-27 NOTE — Progress Notes (Signed)
Patient Discharge Instructions:  [facility] documentation was fax[facility].  Per the [facility] requested no paperwork be sent.  Jerelene ReddenSheena E Crossett, 09/27/2014, 1:53 PM

## 2014-09-28 ENCOUNTER — Other Ambulatory Visit: Payer: Self-pay

## 2014-09-28 MED ORDER — CLONIDINE HCL 0.1 MG PO TABS: 0.1000 mg | ORAL_TABLET | Freq: Two times a day (BID) | ORAL | Status: DC

## 2014-09-28 MED ORDER — CARVEDILOL 12.5 MG PO TABS
12.5000 mg | ORAL_TABLET | Freq: Two times a day (BID) | ORAL | Status: DC
Start: 2014-09-28 — End: 2014-10-18

## 2014-09-28 MED ORDER — HYDROCHLOROTHIAZIDE 25 MG PO TABS: 25.0000 mg | ORAL_TABLET | Freq: Every day | ORAL | Status: DC

## 2014-09-28 MED ORDER — ESOMEPRAZOLE MAGNESIUM 40 MG PO CPDR: 40.0000 mg | DELAYED_RELEASE_CAPSULE | Freq: Every day | ORAL | Status: DC

## 2014-09-28 NOTE — ED Notes (Signed)
pts fiance called, reported pt did not get back his medical paperwork ems brought with him when he was seen on Sunday. Charge rn could not find paperwork, charge offered to print off another copy of discharge paperwork for pt. [facility]. Charge gave fiance medical records number to assist her further.

## 2014-09-29 ENCOUNTER — Other Ambulatory Visit (INDEPENDENT_AMBULATORY_CARE_PROVIDER_SITE_OTHER): Payer: No Typology Code available for payment source

## 2014-09-29 ENCOUNTER — Ambulatory Visit (INDEPENDENT_AMBULATORY_CARE_PROVIDER_SITE_OTHER): Payer: No Typology Code available for payment source | Admitting: Family

## 2014-09-29 ENCOUNTER — Telehealth: Payer: Self-pay

## 2014-09-29 ENCOUNTER — Encounter: Payer: Self-pay | Admitting: Family

## 2014-09-29 VITALS — BP 128/90 | HR 77 | Temp 98.1°F | Resp 18 | Ht 61.0 in | Wt 181.2 lb

## 2014-09-29 DIAGNOSIS — E871 Hypo-osmolality and hyponatremia: Secondary | ICD-10-CM | POA: Insufficient documentation

## 2014-09-29 DIAGNOSIS — F329 Major depressive disorder, single episode, unspecified: Secondary | ICD-10-CM

## 2014-09-29 DIAGNOSIS — F419 Anxiety disorder, unspecified: Secondary | ICD-10-CM

## 2014-09-29 DIAGNOSIS — F418 Other specified anxiety disorders: Secondary | ICD-10-CM

## 2014-09-29 LAB — BASIC METABOLIC PANEL
BUN: 11 mg/dL (ref 6–23)
CALCIUM: 9.6 mg/dL (ref 8.4–10.5)
CO2: 28 meq/L (ref 19–32)
Chloride: 97 mEq/L (ref 96–112)
Creatinine, Ser: 1 mg/dL (ref 0.40–1.50)
GFR: 84.25 mL/min (ref >60.00)
Glucose, Bld: 114 mg/dL — ABNORMAL HIGH (ref 70–99)
Potassium: 4.4 mEq/L (ref 3.5–5.1)
SODIUM: 131 meq/L — AB (ref 135–145)

## 2014-09-29 MED ORDER — CLONIDINE HCL 0.1 MG PO TABS: 0.1000 mg | ORAL_TABLET | Freq: Two times a day (BID) | ORAL | Status: DC

## 2014-09-29 NOTE — Progress Notes (Signed)
Subjective:    Patient ID: Martin Singh, male    DOB: Oct 30, 1965, 49 y.o.   MRN: [id number]  Chief Complaint  Patient presents with  . Hospitalization Follow-up    Says he's waiting to go into a 14 day program needs to make sure all meds are filled before he goes to last 2 weeks,     HPI:  Martin Singh is a 49 y.o. male who presents today for hospital follow-up.  Patient was recently admitted to the behavioral health Hospital for suicidal ideation, depression, anxiety. Indicates that he tried to overdose by huffing a can of computer duster. Patient at that time indicated several stressors led to this including the anniversary of losing his mom, going through a divorce, and his brother committed suicide. Behavioral health records were reviewed in detail. Since leaving the behavioral health Hospital, he was seen in the emergency room on January 31 for suicidal ideations. He was released from the emergency department after receiving 1 L of IV fluids. Initially his sodium was decreased but following fluids was 131.  Patient indicates that he continues to have associated symptoms some nervous breakdowns and anxiety secondary to anniversary of the death of his mother and then his favorite uncle passed away and was cremated on Monday. Indicates that too many things have been going on. States that he continues to be overloaded waiting for disability, moving, bills, etc.   [facility] with [facility] and anxiety. He is in need of a 2 month supply of his medications prior to his admission.    Allergies  Allergen Reactions  . Codeine Anaphylaxis  . Benazepril Cough  . Ativan [Lorazepam] Other (See Comments)    Becomes violent and hallucinates  . Corticosteroids Other (See Comments)    Shaky and high blood pressure  " I can't have too high of a steroid"    Current Outpatient Prescriptions on File Prior to Visit  Medication Sig Dispense Refill  . albuterol  (PROVENTIL HFA;VENTOLIN HFA) 108 (90 BASE) MCG/ACT inhaler Inhale 2 puffs into the lungs every 6 (six) hours as needed for wheezing or shortness of breath. 3 Inhaler 1  . aspirin 81 MG chewable tablet Chew 81 mg by mouth daily.    Marland Kitchen. atenolol (TENORMIN) 100 MG tablet Take 1 tablet (100 mg total) by mouth daily. For high blood pressure 30 tablet 2  . benztropine (COGENTIN) 0.5 MG tablet Take 1 tablet (0.5 mg total) by mouth 2 (two) times daily in the am and at bedtime.. For drug induced tremors 30 tablet 0  . budesonide-formoterol (SYMBICORT) 160-4.5 MCG/ACT inhaler Inhale 2 puffs into the lungs 2 (two) times daily. 3 Inhaler 1  . carbamazepine (TEGRETOL XR) 200 MG 12 hr tablet Take 2 tablets (400 mg total) by mouth 2 (two) times daily. 360 tablet 1  . carvedilol (COREG) 12.5 MG tablet Take 1 tablet (12.5 mg total) by mouth 2 (two) times daily with a meal. 60 tablet 3  . cloNIDine (CATAPRES) 0.1 MG tablet Take 1 tablet (0.1 mg total) by mouth 2 (two) times daily. 60 tablet 0  . esomeprazole (NEXIUM) 40 MG capsule Take 1 capsule (40 mg total) by mouth daily at 12 noon. 60 capsule 3  . fesoterodine (TOVIAZ) 4 MG TB24 tablet Take 4 mg by mouth daily.    Marland Kitchen. gabapentin (NEURONTIN) 300 MG capsule Take 1 capsule (300 mg total) by mouth 4 (four) times daily -  with meals and  at bedtime. 90 capsule 0  . hydrochlorothiazide (HYDRODIURIL) 25 MG tablet Take 1 tablet (25 mg total) by mouth daily. 30 tablet 3  . ibuprofen (ADVIL,MOTRIN) 200 MG tablet Take 600 mg by mouth daily as needed for moderate pain.    Marland Kitchen ipratropium (ATROVENT) 0.03 % nasal spray Two sprays each nostril 2-3 times daily prn. (Patient taking differently: Two sprays each nostril 2-3 times daily prn.) 30 mL 5  . mometasone (NASONEX) 50 MCG/ACT nasal spray Place 2 sprays into the nose daily. 17 g 11  . nystatin (MYCOSTATIN/NYSTOP) 100000 UNIT/GM POWD Apply topically to affected area 2 (two) times daily for yeast infection 1 Bottle 0  . omalizumab  (XOLAIR) 150 MG injection Inject 300 mg into the skin every 28 (twenty-eight) days.    . predniSONE (DELTASONE) 5 MG tablet Take 3 tablets (15 mg total) by mouth daily with breakfast. 90 tablet 0  . PROZAC 20 MG capsule Take 60 mg by mouth at bedtime.  2  . risperiDONE (RISPERDAL M-TABS) 0.5 MG disintegrating tablet Take 1 tablet (0.5 mg total) by mouth 2 (two) times daily in the am and at bedtime.. For mood stabilizer 30 tablet 0  . sertraline (ZOLOFT) 50 MG tablet Take 1 tablet (50 mg total) by mouth daily. For depression 30 tablet 0  . topiramate (TOPAMAX) 25 MG tablet Take 1 tablet (25 mg total) by mouth daily. 120 tablet 3  . traZODone (DESYREL) 50 MG tablet Take 1 tablet (50 mg total) by mouth at bedtime. For sleep 30 tablet 0   No current facility-administered medications on file prior to visit.    Review of Systems  Respiratory: Negative for chest tightness, shortness of breath and wheezing.   Psychiatric/Behavioral: Negative for suicidal ideas, hallucinations (Occasional voices/Denies visual) and self-injury.      Objective:    BP 128/90 mmHg  Pulse 77  Temp(Src) 98.1 F (36.7 C) (Oral)  Resp 18  Wt 181 lb 3.2 oz (82.192 kg)  SpO2 96% Nursing note and vital signs reviewed.  Physical Exam  Constitutional: He is oriented to person, place, and time. He appears well-developed and well-nourished. No distress.  Cardiovascular: Normal rate, regular rhythm, normal heart sounds and intact distal pulses.   Pulmonary/Chest: Effort normal and breath sounds normal.  Neurological: He is alert and oriented to person, place, and time.  Skin: Skin is warm and dry.  Psychiatric: He has a normal mood and affect. His behavior is normal. Judgment and thought content normal.       Assessment & Plan:

## 2014-09-29 NOTE — Assessment & Plan Note (Signed)
Patient was noted to have low sodium levels in the emergency room. Obtain basic metabolic panel to recheck levels.

## 2014-09-29 NOTE — Progress Notes (Signed)
Pre visit review using our clinic review tool, if applicable. No additional management support is needed unless otherwise documented below in the visit note. 

## 2014-09-29 NOTE — Patient Instructions (Signed)
Thank you for choosing [facility].  Summary/Instructions:  We will work on your prescriptions and place them in the mail [facility]   Your prescription(s) have been submitted to your pharmacy or been printed and provided for you. Please take as directed and contact our office if you believe you are having problem(s) with the medication(s) or have any questions.  Please stop by the lab on the basement level of the building for your blood work. Your results will be released to MyChart (or called to you) after review, usually within 72 hours after test completion. If any changes need to be made, you will be notified at that same time.  If your symptoms worsen or fail to improve, please contact our office for further instruction, or in case of emergency go directly to the emergency room at the closest medical facility.

## 2014-09-29 NOTE — Telephone Encounter (Signed)
All prescriptions that were given to us during the office visit on today 09/29/2014 were faxed as well as mailed per pts request.

## 2014-09-29 NOTE — Assessment & Plan Note (Signed)
Anxiety and depression are labile. Patient indicates he continues to have nervous breaks at times. [facility] after obtaining a two-week supply of his medications. [facility] and [facility]. [facility]

## 2014-10-05 ENCOUNTER — Ambulatory Visit (INDEPENDENT_AMBULATORY_CARE_PROVIDER_SITE_OTHER): Payer: No Typology Code available for payment source

## 2014-10-05 ENCOUNTER — Ambulatory Visit (INDEPENDENT_AMBULATORY_CARE_PROVIDER_SITE_OTHER): Payer: No Typology Code available for payment source | Admitting: Emergency Medicine

## 2014-10-05 ENCOUNTER — Encounter: Payer: Self-pay | Admitting: Emergency Medicine

## 2014-10-05 ENCOUNTER — Telehealth: Payer: Self-pay | Admitting: Emergency Medicine

## 2014-10-05 VITALS — BP 120/72 | HR 80 | Temp 98.1°F | Ht 60.0 in | Wt 180.0 lb

## 2014-10-05 DIAGNOSIS — H9190 Unspecified hearing loss, unspecified ear: Secondary | ICD-10-CM

## 2014-10-05 DIAGNOSIS — J45909 Unspecified asthma, uncomplicated: Secondary | ICD-10-CM

## 2014-10-05 NOTE — Telephone Encounter (Signed)
Per referral notes, pt needs referral from PCP. Spoke with patient, states that this is being worked on by his primary care MD. Nothing further needed from our office .  Referral Notes     Type Date User   General 10/05/2014 11:46 AM COBB, RHONDA J        Note   Called Dr. Allayne Stackrossley's office and Dr. Allene PyoNewman's office and neither one participates with this program. Per [facility] fee pt will need to see his primary physician and let primary complete this referral if needed. Pt has been explained this and that he will need to schedule appointment to see primary. Pt already has appointment in April, but advised to see him sooner for this issue.  Spoke with Lillia AbedLindsay and she will let Dr. Delton CoombesByrum know. Rhonda J Cobb

## 2014-10-05 NOTE — Patient Instructions (Signed)
Please continue your xolair every month as you are taking it Please continue prednisone 15mg for another week. Then decrease to 10mg daily for the next month.  We will refer you to ENT to evaluate your hearing.  Follow with Tammy Parrett in one month to check on your progress. If doing well then will decrease to 5mg daily.  Follow with Dr Izak Anding in 3 months or sooner if you have any problems. 

## 2014-10-05 NOTE — Addendum Note (Signed)
Addended by: Jaynee EaglesLEMONS, LINDSAY C on: 10/05/2014 11:02 AM   Modules accepted: Orders

## 2014-10-05 NOTE — Progress Notes (Signed)
Subjective:    Patient ID: Martin Singh, male    DOB: [birth year], 49 y.o.   MRN: [id number]  Asthma He complains of cough and shortness of breath. There is no wheezing. Associated symptoms include postnasal drip and rhinorrhea. Pertinent negatives include no ear pain, fever, headaches, sneezing, sore throat or trouble swallowing. His past medical history is significant for asthma.   49 yo man, 28 pk-years tobacco, seizures, depression, EtOH and substance use, HH. Presents for eval of SOB that has been noticeable for almost 20 years. He was dx with suspected asthma at that time.  He stopped smoking about 20 yrs ago. He is having severe DOE with minimal exertion. He is having higher BP and HA after atenolol stopped recently.  He uses albuterol about 1-2x a day. Has a regular dry cough. Has some exertional wheeze. He snores and has witnessed apneas.  He has a lot of sinus drainage, congestion, some bleeding.  Has GERD that is managed by nexium.   He was seen in the ED on 10/18/13 for leg pain and edema and intermittent shortness of breath after a long bus ride / plane ride. His mother had passed away. Workup for PE was negative. He returned to the ED on 10/30/13 for back pain after he fell due to slipping down the stairs. XR of lumbar spine revealed mild degenerative changes at L5-S1 but no acute fractures. He has been taking naproxen a couple of times a day.   ROV 12/08/13 -- follows up for dyspnea, sinus disease. 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 He is on flonase.   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 Last time we started Symbicort > returns to discuss. He was less SOB after starting. For the last week he has been having more cough, white mucous, some fever, fatigue. He snores, has had some apneic wake-ups  ROV 05/06/14 -- COPD / asthma with mild AFL and borderline BD response, allergic  rhinitis.  He has been having fever, PND, cough prod of tan mucous, HA, nausea, beginning 7+ days ago. His PSG showed some obstruction but no formal OSA.   ROV 06/09/14 -- COPD/asthma. His AFL is mild and sx would appear to be out of proportion. He has had frequent flares of apparent bronchitis, purulent cough. He has a lot of yellow sinus drainage with HA, sinus pressure. He has been treated with abx and pred x2 since last visit. He is using benadryl, nexium, zantac. He is on symbicort + SABA, uses .   ROV 07/15/14 - COPD and asthma, mild AFL, frequent flares of apparent bronchitis, purulent cough. He had a sinus CT that was negative for sinusitis on 07/09/14, there was a mucosal retention cyst versus polyp in the right maxillary sinus and a small amount of fluid in the right ethmoid air cell. He has only had relief when on prednisone. He has throat wheezing, dry cough. Not currently on fluticasone, not sure if he is taking the zyrtec. He has breakthrough GERD and also has a significant dust exposure at home.   ROV 07/28/14 -- COPD and asthma, mild AFL, frequent flares of apparent bronchitis, purulent cough. Last time we restarted GERD and allergy regimens, started pred 20. He still has cough but is much better. He is back on nexium, is on zyrtec. Doesn't have fluticasone yet - needs to change to nasonex for formulary.   ROV  08/10/14 -- COPD and asthma, mild AFL, frequent flares of apparent bronchitis, purulent cough. He is on Pred  right now. He was temporarily better when we treated with levaquin - now back at his baseline > lot of cough, mucous, ear fullness.   ROV 10/05/14 -- COPD and asthma, mild AFL, frequent flares of apparent bronchitis, purulent cough. He is tolerating and doing well on xolair. His pred is down to  qd. Breathing and nasal sx appear to be improved.  His sinuses and chest are both improved.     Review of Systems  Constitutional: Negative for fever and unexpected weight  change.  HENT: Positive for postnasal drip and rhinorrhea. Negative for congestion, dental problem, ear pain, nosebleeds, sinus pressure, sneezing, sore throat and trouble swallowing.   Eyes: Negative for redness and itching.  Respiratory: Positive for cough, chest tightness and shortness of breath. Negative for wheezing.   Cardiovascular: Negative for palpitations and leg swelling.  Gastrointestinal: Negative for nausea and vomiting.  Genitourinary: Negative for dysuria.  Musculoskeletal: Negative for joint swelling.  Skin: Negative for rash.  Neurological: Negative for headaches.  Hematological: Does not bruise/bleed easily.  Psychiatric/Behavioral: Negative for dysphoric mood. The patient is not nervous/anxious.         Objective:   Physical Exam Filed Vitals:   10/05/14 1036 10/05/14 1037  BP:  120/72  Pulse:  80  Temp: 98.1 F (36.7 C)   TempSrc: Oral   Height: 5' (1.524 m)   Weight: 180 lb (81.647 kg)   SpO2:  98%   Gen: Pleasant, well-nourished, in no distress,  normal affect  ENT: No lesions,  mouth clear,  oropharynx clear, no postnasal drip  Neck: No JVD, no TMG, no carotid bruits  Lungs: No use of accessory muscles, significant dry cough  Cardiovascular: RRR, heart sounds normal, no murmur or gallops, no peripheral edema  Abdomen: soft, protuberant, benign  Musculoskeletal: No deformities, no cyanosis or clubbing  Neuro: alert, non focal  Skin: Warm, no lesions or rashes       Assessment & Plan:  Intrinsic asthma Please continue your xolair every month as you are taking it Please continue prednisone  for another week. Then decrease to  daily for the next month.  We will refer you to ENT to evaluate your hearing.  Follow with Tammy Parrett in one month to check on your progress. If doing well then will decrease to  daily.  Follow with Dr Delton Coombes in 3 months or sooner if you have any problems.

## 2014-10-05 NOTE — Assessment & Plan Note (Signed)
Please continue your xolair every month as you are taking it Please continue prednisone 15mg  for another week. Then decrease to 10mg  daily for the next month.  We will refer you to ENT to evaluate your hearing.  Follow with Tammy Parrett in one month to check on your progress. If doing well then will decrease to 5mg  daily.  Follow with Dr Delton CoombesByrum in 3 months or sooner if you have any problems.

## 2014-10-06 ENCOUNTER — Telehealth: Payer: Self-pay | Admitting: Emergency Medicine

## 2014-10-06 DIAGNOSIS — H9190 Unspecified hearing loss, unspecified ear: Secondary | ICD-10-CM

## 2014-10-06 NOTE — Telephone Encounter (Signed)
Pt was seen yesterday.  Referral placed for ENT for hearing loss.  However, PCCs checked on this and ENT does not accept pt's insurance.  Pt would need to see PCP first and then referral would need to be made if appropriate.    Spoke with [name], significant other.  Reports pt really only needs to see a Actor[employer]ensed Audiologist for a formal hearing test before they can reaply for Medicaid.  [facility] given above.  Referral placed.  [name] aware and voiced no further questions or concerns at this time.

## 2014-10-06 NOTE — Telephone Encounter (Signed)
Pt returning call.Martin Singh ° °

## 2014-10-06 NOTE — Telephone Encounter (Signed)
lmomtcb x1 

## 2014-10-12 ENCOUNTER — Other Ambulatory Visit: Payer: Self-pay | Admitting: *Deleted

## 2014-10-12 ENCOUNTER — Telehealth: Payer: Self-pay | Admitting: *Deleted

## 2014-10-12 ENCOUNTER — Telehealth: Payer: Self-pay | Admitting: Family

## 2014-10-12 DIAGNOSIS — R569 Unspecified convulsions: Secondary | ICD-10-CM

## 2014-10-12 MED ORDER — CARBAMAZEPINE 200 MG PO TABS: 200.0000 mg | ORAL_TABLET | Freq: Three times a day (TID) | ORAL | Status: DC

## 2014-10-12 NOTE — Telephone Encounter (Signed)
Care giver called in about med refills.  She has some question about them requesting a call back    Dewayne Hatchnn [phone]

## 2014-10-12 NOTE — Telephone Encounter (Signed)
Medication called to [facility] for tegertol xr 200 mg # 360  1 ma 1 noon and 2 at hs 2 refills

## 2014-10-13 ENCOUNTER — Telehealth: Payer: Self-pay | Admitting: Emergency Medicine

## 2014-10-13 ENCOUNTER — Encounter (HOSPITAL_COMMUNITY): Payer: Self-pay | Admitting: Emergency Medicine

## 2014-10-13 ENCOUNTER — Emergency Department (HOSPITAL_COMMUNITY)
Admission: EM | Admit: 2014-10-13 | Discharge: 2014-10-14 | Disposition: A | Discharge status: Other Acute Inpatient Hospital | Payer: No Typology Code available for payment source | Attending: Emergency Medicine | Admitting: Emergency Medicine

## 2014-10-13 DIAGNOSIS — Z79899 Other long term (current) drug therapy: Secondary | ICD-10-CM | POA: Insufficient documentation

## 2014-10-13 DIAGNOSIS — K219 Gastro-esophageal reflux disease without esophagitis: Secondary | ICD-10-CM | POA: Insufficient documentation

## 2014-10-13 DIAGNOSIS — J45909 Unspecified asthma, uncomplicated: Secondary | ICD-10-CM

## 2014-10-13 DIAGNOSIS — G40909 Epilepsy, unspecified, not intractable, without status epilepticus: Secondary | ICD-10-CM | POA: Insufficient documentation

## 2014-10-13 DIAGNOSIS — Z7982 Long term (current) use of aspirin: Secondary | ICD-10-CM | POA: Insufficient documentation

## 2014-10-13 DIAGNOSIS — Z87891 Personal history of nicotine dependence: Secondary | ICD-10-CM | POA: Insufficient documentation

## 2014-10-13 DIAGNOSIS — E781 Pure hyperglyceridemia: Secondary | ICD-10-CM | POA: Insufficient documentation

## 2014-10-13 DIAGNOSIS — I10 Essential (primary) hypertension: Secondary | ICD-10-CM | POA: Insufficient documentation

## 2014-10-13 DIAGNOSIS — G4700 Insomnia, unspecified: Secondary | ICD-10-CM | POA: Insufficient documentation

## 2014-10-13 DIAGNOSIS — M1990 Unspecified osteoarthritis, unspecified site: Secondary | ICD-10-CM | POA: Insufficient documentation

## 2014-10-13 DIAGNOSIS — R45851 Suicidal ideations: Secondary | ICD-10-CM | POA: Insufficient documentation

## 2014-10-13 LAB — COMPREHENSIVE METABOLIC PANEL
ALT: 21 U/L (ref 0–53)
AST: 20 U/L (ref 0–37)
Albumin: 4.1 g/dL (ref 3.5–5.2)
Alkaline Phosphatase: 86 U/L (ref 39–117)
Anion gap: 9 (ref 5–15)
BILIRUBIN TOTAL: 0.3 mg/dL (ref 0.3–1.2)
BUN: 11 mg/dL (ref 6–23)
CALCIUM: 9.4 mg/dL (ref 8.4–10.5)
CO2: 27 mmol/L (ref 19–32)
Chloride: 100 mmol/L (ref 96–112)
Creatinine, Ser: 0.82 mg/dL (ref 0.50–1.35)
GFR calc Af Amer: >90 mL/min (ref >90)
GLUCOSE: 113 mg/dL — AB (ref 70–99)
Potassium: 3.7 mmol/L (ref 3.5–5.1)
Sodium: 136 mmol/L (ref 135–145)
Total Protein: 7.2 g/dL (ref 6.0–8.3)

## 2014-10-13 LAB — CBC
HCT: 37.7 % — ABNORMAL LOW (ref 39.0–52.0)
Hemoglobin: 12 g/dL — ABNORMAL LOW (ref 13.0–17.0)
MCH: 27.8 pg (ref 26.0–34.0)
MCHC: 31.8 g/dL (ref 30.0–36.0)
MCV: 87.3 fL (ref 78.0–100.0)
PLATELETS: 285 10*3/uL (ref 150–400)
RBC: 4.32 MIL/uL (ref 4.22–5.81)
RDW: 13.4 % (ref 11.5–15.5)
WBC: 6.7 10*3/uL (ref 4.0–10.5)

## 2014-10-13 LAB — SALICYLATE LEVEL: Salicylate Lvl: <4 mg/dL (ref 2.8–20.0)

## 2014-10-13 LAB — RAPID URINE DRUG SCREEN, HOSP PERFORMED
Amphetamines: NOT DETECTED
BARBITURATES: NOT DETECTED
Benzodiazepines: NOT DETECTED
COCAINE: NOT DETECTED
Opiates: NOT DETECTED
TETRAHYDROCANNABINOL: NOT DETECTED

## 2014-10-13 LAB — ACETAMINOPHEN LEVEL

## 2014-10-13 LAB — ETHANOL: Alcohol, Ethyl (B): <5 mg/dL (ref 0–9)

## 2014-10-13 MED ORDER — ACETAMINOPHEN 325 MG PO TABS: 650.0000 mg | ORAL_TABLET | ORAL | Status: DC | PRN

## 2014-10-13 MED ORDER — ALUM & MAG HYDROXIDE-SIMETH 200-200-20 MG/5ML PO SUSP: 30.0000 mL | ORAL | Status: DC | PRN

## 2014-10-13 MED ORDER — ONDANSETRON HCL 4 MG PO TABS: 4.0000 mg | ORAL_TABLET | Freq: Three times a day (TID) | ORAL | Status: DC | PRN

## 2014-10-13 MED ORDER — OMALIZUMAB 150 MG ~~LOC~~ SOLR
300.0000 mg | Freq: Once | SUBCUTANEOUS | Status: AC
  Administered 2014-10-05: 300 mg via SUBCUTANEOUS

## 2014-10-13 MED ORDER — IBUPROFEN 200 MG PO TABS
600.0000 mg | ORAL_TABLET | Freq: Three times a day (TID) | ORAL | Status: DC | PRN
  Administered 2014-10-13: 600 mg via ORAL
  Filled 2014-10-13: qty 3

## 2014-10-13 MED ORDER — OMEPRAZOLE 40 MG PO CPDR: 40.0000 mg | DELAYED_RELEASE_CAPSULE | Freq: Every day | ORAL | Status: DC

## 2014-10-13 NOTE — ED Provider Notes (Signed)
CSN: [id number]     Arrival date & time 10/13/14  1710 History   First MD Initiated Contact with Patient 10/13/14 1754     Chief Complaint  Patient presents with  . Suicidal     (Consider location/radiation/quality/duration/timing/severity/associated sxs/prior Treatment) HPI Patient presents to the emergency department with saddle thoughts and ideations.  The patient states that he uses inhalants and narcotics.  Patient states that nothing seems make his condition, better or worse.  Patient states that he has been admitted for suicidal ideation in the past.  Patient has chest pain, shortness breath, weakness, dizziness, headache, blurred vision, back pain, neck pain, cough, runny nose, sore throat, or syncope.  Patient is denying hallucinations. Past Medical History  Diagnosis Date  . Hypertension   . Anxiety and depression   . Osteoarthrosis, unspecified whether generalized or localized, unspecified site   . Irritable bowel syndrome   . GERD (gastroesophageal reflux disease)     h/o esophagitis  . Allergic rhinitis   . Insomnia   . Diverticulosis   . Alcohol abuse 2012  . Polysubstance abuse   . Seizures   . Alcoholic pancreatitis   . Periumbilical hernia 12/13/12  . Fatty liver 10/08/11  . Internal hemorrhoids 03/13/11  . Hiatal hernia 03/13/11  . Asthmatic bronchitis   . Frequent headaches   . 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 Prediabetes 03/01/2014  . Depression    Past Surgical History  Procedure Laterality Date  . Tibula surgery Right 2002   Family History  Problem Relation Age of Onset  . Heart disease Mother   . Gallbladder disease Mother   . Nephrolithiasis Mother   . Arthritis Mother   . Hyperlipidemia Mother   . Stroke Mother   . Irritable bowel syndrome Mother   . Cancer Mother     vulva  . Colon cancer Neg Hx   . Hypertension Father     moth  . Heart disease Father   . Arthritis Father   . Irritable bowel syndrome Brother     x 2   History   Substance Use Topics  . Smoking status: Former Smoker -- 1.50 packs/day for 18 years    Quit date: 08/28/1991  . Smokeless tobacco: Current User    Types: Snuff     Comment: currently uses snuff.11/17/13  . Alcohol Use: No     Comment: no alcohol in one yr; drank 35 years alcohol, quit 02/2012    Review of Systems  All other systems negative except as documented in the HPI. All pertinent positives and negatives as reviewed in the HPI.  Allergies  Codeine; Benazepril; Ativan; and Corticosteroids  Home Medications   Prior to Admission medications   Medication Sig Start Date End Date Taking? Authorizing Provider  albuterol (PROVENTIL HFA;VENTOLIN HFA) 108 (90 BASE) MCG/ACT inhaler Inhale 2 puffs into the lungs every 6 (six) hours as needed for wheezing or shortness of breath. 09/22/14  Yes Shuvon Rankin, NP  aspirin 81 MG chewable tablet Chew 81 mg by mouth daily.   Yes Historical Provider, MD  atenolol (TENORMIN) 100 MG tablet Take 1 tablet (100 mg total) by mouth daily. For high blood pressure 09/22/14  Yes Shuvon Rankin, NP  benztropine (COGENTIN) 0.5 MG tablet Take 1 tablet (0.5 mg total) by mouth 2 (two) times daily in the am and at bedtime.. For drug induced tremors 09/22/14  Yes Shuvon Rankin, NP  budesonide-formoterol (SYMBICORT) 160-4.5 MCG/ACT inhaler Inhale 2 puffs into the lungs 2 (two) times  daily. 09/07/14  Yes Tammy S Parrett, NP  carbamazepine (TEGRETOL) 200 MG tablet Take 1 tablet (200 mg total) by mouth 3 (three) times daily. Take 1 tab in am 1 tab at noon and 2 at HS total 800 mg daily  # 360 with 2 refills Patient taking differently: Take 200-400 mg by mouth 3 (three) times daily. Take 1 tab in am 1 tab at noon and 2 at HS total 10/12/14  Yes Adam Gus Rankin, DO  carvedilol (COREG) 12.5 MG tablet Take 1 tablet (12.5 mg total) by mouth 2 (two) times daily with a meal. 09/28/14  Yes Jeanine Luz, FNP  cloNIDine (CATAPRES) 0.1 MG tablet Take 1 tablet (0.1 mg total) by mouth 2  (two) times daily. 09/29/14  Yes Jeanine Luz, FNP  diphenhydrAMINE (BENADRYL) 25 MG tablet Take 25 mg by mouth every 6 (six) hours as needed for allergies.   Yes Historical Provider, MD  esomeprazole (NEXIUM) 40 MG capsule Take 1 capsule (40 mg total) by mouth daily at 12 noon. 09/28/14  Yes Jeanine Luz, FNP  fesoterodine (TOVIAZ) 4 MG TB24 tablet Take 4 mg by mouth daily.   Yes Historical Provider, MD  gabapentin (NEURONTIN) 300 MG capsule Take 1 capsule (300 mg total) by mouth 4 (four) times daily -  with meals and at bedtime. 09/22/14  Yes Shuvon Rankin, NP  hydrochlorothiazide (HYDRODIURIL) 25 MG tablet Take 1 tablet (25 mg total) by mouth daily. 09/28/14  Yes Jeanine Luz, FNP  ibuprofen (ADVIL,MOTRIN) 200 MG tablet Take 600 mg by mouth daily as needed for moderate pain.   Yes Historical Provider, MD  ipratropium (ATROVENT) 0.03 % nasal spray Two sprays each nostril 2-3 times daily prn. Patient taking differently: Place 2 sprays into both nostrils 3 (three) times daily as needed.  06/23/14  Yes Leslye Peer, MD  Iron Combinations (IRON COMPLEX PO) Take 1 capsule by mouth daily.   Yes Historical Provider, MD  Multiple Vitamin (MULTIVITAMIN WITH MINERALS) TABS tablet Take 1 tablet by mouth daily.   Yes Historical Provider, MD  nystatin (MYCOSTATIN/NYSTOP) 100000 UNIT/GM POWD Apply topically to affected area 2 (two) times daily for yeast infection Patient taking differently: Apply 1 g topically 2 (two) times daily as needed. Apply topically to affected area 2 (two) times daily for yeast infection 09/22/14  Yes Shuvon Rankin, NP  omalizumab (XOLAIR) 150 MG injection Inject 300 mg into the skin every 28 (twenty-eight) days.   Yes Historical Provider, MD  omeprazole (PRILOSEC) 40 MG capsule Take 1 capsule (40 mg total) by mouth daily. 10/13/14  Yes Leslye Peer, MD  predniSONE (DELTASONE) 5 MG tablet Take 3 tablets (15 mg total) by mouth daily with breakfast. 09/22/14  Yes Shuvon Rankin, NP   risperiDONE (RISPERDAL M-TABS) 0.5 MG disintegrating tablet Take 1 tablet (0.5 mg total) by mouth 2 (two) times daily in the am and at bedtime.. For mood stabilizer 09/22/14  Yes Shuvon Rankin, NP  sertraline (ZOLOFT) 50 MG tablet Take 1 tablet (50 mg total) by mouth daily. For depression 09/22/14  Yes Shuvon Rankin, NP  tetrahydrozoline 0.05 % ophthalmic solution Place 1 drop into both eyes 3 (three) times daily as needed (dry eyes).   Yes Historical Provider, MD  topiramate (TOPAMAX) 25 MG tablet Take 1 tablet (25 mg total) by mouth daily. 05/19/14  Yes Adam Gus Rankin, DO  traZODone (DESYREL) 50 MG tablet Take 1 tablet (50 mg total) by mouth at bedtime. For sleep 09/22/14  Yes Assunta Found, NP  carbamazepine (TEGRETOL XR) 200 MG 12 hr tablet Take 2 tablets (400 mg total) by mouth 2 (two) times daily. Patient not taking: Reported on 10/13/2014 07/16/14   Cira ServantAdam Robert Jaffe, DO   BP 143/94 mmHg  Pulse 67  Temp(Src) 98 F (36.7 C) (Oral)  Resp 18  SpO2 97% Physical Exam  Constitutional: He appears well-developed and well-nourished. No distress.  HENT:  Head: Normocephalic and atraumatic.  Mouth/Throat: Oropharynx is clear and moist.  Eyes: Pupils are equal, round, and reactive to light.  Neck: Normal range of motion. Neck supple.  Cardiovascular: Normal rate and regular rhythm.   Pulmonary/Chest: Effort normal and breath sounds normal.  Skin: Skin is warm and dry. No rash noted. No erythema.  Psychiatric: Judgment normal. He expresses suicidal ideation. He expresses no suicidal plans.  Nursing note and vitals reviewed.   ED Course  Procedures (including critical care time) Labs Review Labs Reviewed  ACETAMINOPHEN LEVEL - Abnormal; Notable for the following:    Acetaminophen (Tylenol), Serum <10.0 (*)    All other components within normal limits  CBC - Abnormal; Notable for the following:    Hemoglobin 12.0 (*)    HCT 37.7 (*)    All other components within normal limits   COMPREHENSIVE METABOLIC PANEL - Abnormal; Notable for the following:    Glucose, Bld 113 (*)    All other components within normal limits  ETHANOL  SALICYLATE LEVEL  URINE RAPID DRUG SCREEN (HOSP PERFORMED)   Patient will need TTS evaluation  MDM   Final diagnoses:  None       Carlyle DollyChristopher W Tedrick Port, PA-C 10/13/14 1932  Suzi RootsKevin E Steinl, MD 10/16/14 [phone]

## 2014-10-13 NOTE — Telephone Encounter (Signed)
[facility] will no longer cover Nexium.  They are requesting that patient be changed to Omeprazole.  Their phone number is [phone]; Fax: [phone].

## 2014-10-13 NOTE — BH Assessment (Signed)
Tele Assessment Note   Martin Singh is a 49 y.o. male who voluntarily presents to [facility] with SI/SA and depression.  Pt reports SI thoughts x1 month, since his discharge from [facility].  Pt says thoughts immediately started after leaving.  Pt.'s plan is to "huff on cans" and says he's been huffing on cans today.  Pt admits 3-4 previous SI attempts by overdose, huffing and jumping off a bridge.  Pt says he's been abusing dilaudid and uses at least 5-4mg  pills a day.  His last use was 10/12/14, he used 5 pills.  Pt denies HI, but endorses AH, stating that he hears his mother, son and nephew's voice, with no command to harm self.  Pt reports stressors: uncle passed away and medication not working.  Axis I: Major depressive disorder, Recurrent episode, With psychotic features;Opioid use disorder, Severe; Anxiety disorder   Axis II: Deferred Axis III:  Past Medical History  Diagnosis Date  . Hypertension   . Anxiety and depression   . Osteoarthrosis, unspecified whether generalized or localized, unspecified site   . Irritable bowel syndrome   . GERD (gastroesophageal reflux disease)     h/o esophagitis  . Allergic rhinitis   . Insomnia   . Diverticulosis   . Alcohol abuse 2012  . Polysubstance abuse   . Seizures   . Alcoholic pancreatitis   . Periumbilical hernia 12/13/12  . Fatty liver 10/08/11  . Internal hemorrhoids 03/13/11  . Hiatal hernia 03/13/11  . Asthmatic bronchitis   . Frequent headaches   . 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 Prediabetes 03/01/2014  . Depression   . Anxiety    Axis IV: other psychosocial or environmental problems, problems related to social environment and problems with primary support group Axis V: 31-40 impairment in reality testing  Past Medical History:  Past Medical History  Diagnosis Date  . Hypertension   . Anxiety and depression   . Osteoarthrosis, unspecified whether generalized or localized, unspecified site   . Irritable bowel syndrome   . GERD  (gastroesophageal reflux disease)     h/o esophagitis  . Allergic rhinitis   . Insomnia   . Diverticulosis   . Alcohol abuse 2012  . Polysubstance abuse   . Seizures   . Alcoholic pancreatitis   . Periumbilical hernia 12/13/12  . Fatty liver 10/08/11  . Internal hemorrhoids 03/13/11  . Hiatal hernia 03/13/11  . Asthmatic bronchitis   . Frequent headaches   . 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 Prediabetes 03/01/2014  . Depression   . Anxiety     Past Surgical History  Procedure Laterality Date  . Tibula surgery Right 2002    Family History:  Family History  Problem Relation Age of Onset  . Heart disease Mother   . Gallbladder disease Mother   . Nephrolithiasis Mother   . Arthritis Mother   . Hyperlipidemia Mother   . Stroke Mother   . Irritable bowel syndrome Mother   . Cancer Mother     vulva  . Colon cancer Neg Hx   . Hypertension Father     moth  . Heart disease Father   . Arthritis Father   . Irritable bowel syndrome Brother     x 2    Social History:  reports that he quit smoking about 23 years ago. His smokeless tobacco use includes Snuff. He reports that he does not drink alcohol or use illicit drugs.  Additional Social History:  Alcohol / Drug Use Pain Medications: See [name]  Prescriptions: See MAR  Over the Counter: See MAR  History of alcohol / drug use?: Yes Longest period of sobriety (when/how long): None  Negative Consequences of Use: Work / Programmer, multimediachool, Copywriter, advertisingersonal relationships, Surveyor, quantityinancial Withdrawal Symptoms: Other (Comment) (No w/d sxs ) Substance #1 Name of Substance 1: Dilaudid  1 - Age of First Use: 48 YOM  1 - Amount (size/oz): 5-4mg  Pills  1 - Frequency: Daily  1 - Duration: On-going  1 - Last Use / Amount: 10/12/14  CIWA: CIWA-Ar BP: 143/94 mmHg Pulse Rate: 67 COWS:    PATIENT STRENGTHS: (choose at least two) Communication skills Motivation for treatment/growth Supportive family/friends  Allergies:  Allergies  Allergen Reactions  .  Codeine Anaphylaxis  . Benazepril Cough  . Ativan [Lorazepam] Other (See Comments)    Becomes violent and hallucinates  . Corticosteroids Other (See Comments)    Shaky and high blood pressure  " I can't have too high of a steroid"    Home Medications:  (Not in a hospital admission)  OB/GYN Status:  No LMP for male patient.  General Assessment Data Location of Assessment: [facility] ED Is this a Tele or Face-to-Face Assessment?: Tele Assessment Is this an Initial Assessment or a Re-assessment for this encounter?: Initial Assessment Living Arrangements: Spouse/significant other (Lives with fiance ) Can pt return to current living arrangement?: Yes Admission Status: Voluntary Is patient capable of signing voluntary admission?: Yes Transfer from: Home Referral Source: Self/Family/Friend  Medical Screening Exam [facility] Walk-in ONLY) Medical Exam completed: No Reason for MSE not completed: Other: (None )  [facility] Living Arrangements: Spouse/significant other (Lives with fiance ) [facility] of Psychiatrist[facility]  Name of Therapist: [facility]   Education Status Is patient currently in school?: No Current Grade: None  Highest grade of school patient has completed: None Name of school: None  Contact person: None   Risk to self with the past 6 months Suicidal Ideation: Yes-Currently Present Suicidal Intent: Yes-Currently Present Is patient at risk for suicide?: Yes Suicidal Plan?: Yes-Currently Present Specify Current Suicidal Plan: "Huffinf on cans"  Access to Means: Yes Specify Access to Suicidal Means: Inhalants What has been your use of drugs/alcohol within the last 12 months?: Abusing" dilaudid  Previous Attempts/Gestures: Yes How many times?: 4 Other Self Harm Risks: None  Triggers for Past Attempts: Unpredictable, Family contact Intentional Self Injurious Behavior: Damaging Comment - Self Injurious Behavior: Huffing Inhalants   Family Suicide History: Unknown Recent stressful life event(s): Loss (Comment), Other (Comment) (Medication not effective, Uncle died ) Persecutory voices/beliefs?: No Depression: Yes Depression Symptoms: Insomnia, Isolating, Loss of interest in usual pleasures, Feeling worthless/self pity, Fatigue Substance abuse history and/or treatment for substance abuse?: Yes Suicide prevention information given to non-admitted patients: Not applicable  Risk to Others within the past 6 months Homicidal Ideation: No Thoughts of Harm to Others: No Current Homicidal Intent: No Current Homicidal Plan: No Access to Homicidal Means: No Identified Victim: None  History of harm to others?: No Assessment of Violence: None Noted Violent Behavior Description: None  Does patient have access to weapons?: No Criminal Charges Pending?: No Describe Pending Criminal Charges: None  Does patient have a court date: No Court Date:  (None )  Psychosis Hallucinations: Auditory Delusions: None noted  Mental Status Report Appear/Hygiene: In scrubs Eye Contact: Good Motor Activity: Unremarkable Speech: Logical/coherent Level of Consciousness: Alert Mood: Depressed Affect: Depressed, Appropriate to circumstance Anxiety Level: None Thought Processes: Coherent, Relevant Judgement: Impaired  Orientation: Person, Place, Time, Situation Obsessive Compulsive Thoughts/Behaviors: None  Cognitive Functioning Memory: Recent Intact, Remote Intact IQ: Average Insight: Poor Impulse Control: Poor Appetite: Fair Weight Loss: 0 Weight Gain: 0 Sleep: Decreased Total Hours of Sleep: 5 Vegetative Symptoms: None  ADLScreening [facility]) Patient's cognitive ability adequate to safely complete daily activities?: Yes Patient able to express need for assistance with ADLs?: No Independently performs ADLs?: Yes (appropriate for developmental age)  Prior Inpatient Therapy Prior Inpatient Therapy:  Yes Prior Therapy Dates: 2013,2014,2016 Prior Therapy Facilty/Provider(s): [facility]  Reason for Treatment: SI/Depression   Prior Outpatient Therapy Prior Outpatient Therapy: Yes Prior Therapy Dates: Current  Prior Therapy Facilty/Provider(s): [facility] Reason for Treatment: Med Mgt/Therapy   ADL Screening (condition at time of admission) Patient's cognitive ability adequate to safely complete daily activities?: Yes Is the patient deaf or have difficulty hearing?: No Does the patient have difficulty seeing, even when wearing glasses/contacts?: No Does the patient have difficulty concentrating, remembering, or making decisions?: Yes Patient able to express need for assistance with ADLs?: No Does the patient have difficulty dressing or bathing?: Yes Independently performs ADLs?: Yes (appropriate for developmental age) Does the patient have difficulty walking or climbing stairs?: No Weakness of Legs: None Weakness of Arms/Hands: None  Home Assistive Devices/Equipment Home Assistive Devices/Equipment: None  Therapy Consults (therapy consults require a physician order) PT Evaluation Needed: No OT Evalulation Needed: No SLP Evaluation Needed: No Abuse/Neglect Assessment (Assessment to be complete while patient is alone) Physical Abuse: Denies Verbal Abuse: Denies Sexual Abuse: Denies Exploitation of patient/patient's resources: Denies Self-Neglect: Denies Values / Beliefs Cultural Requests During Hospitalization: None Spiritual Requests During Hospitalization: None Consults Spiritual Care Consult Needed: No Social Work Consult Needed: No Merchant navy officer (For Healthcare) Does patient have an advance directive?: No Would patient like information on creating an advanced directive?: No - patient declined information    Additional Information 1:1 In Past 12 Months?: No CIRT Risk: No Elopement Risk: No Does patient have medical clearance?: Yes      Disposition:  Disposition Initial Assessment Completed for this Encounter: Yes Disposition of Patient: Referred to (Per Donell Sievert, PA recommend ) Type of inpatient treatment program: Adult Type of outpatient treatment: Adult Patient referred to: Other (Comment) (Per Donell Sievert, PA recommend )  Beatrix Shipper C 10/13/2014 8:14 PM

## 2014-10-13 NOTE — ED Notes (Signed)
Patient denies SI, HI, AVH at present. Reports feelings of anxiety and depression at 6/10. States sleep and appetite have been fluctuating in recent weeks. Patient is calm, cooperative.  Encouragement offered. Given motrin, meal.  Q 15 safety checks in place.

## 2014-10-13 NOTE — Telephone Encounter (Signed)
I am Ok with the change to ome[prazole 40mg  po qd

## 2014-10-13 NOTE — ED Notes (Signed)
Per patient, states he has been having suicidal thoughts-states he has bee using inhalants, and narcotics-states [facility] is [facility]

## 2014-10-13 NOTE — Telephone Encounter (Signed)
Rx for Omeprazole has been sent in. Nothing further was needed.

## 2014-10-14 ENCOUNTER — Encounter (HOSPITAL_COMMUNITY): Payer: Self-pay

## 2014-10-14 ENCOUNTER — Inpatient Hospital Stay (HOSPITAL_COMMUNITY)
Admission: EM | Admit: 2014-10-14 | Discharge: 2014-10-18 | DRG: 885 | Disposition: A | Discharge status: Home / Self Care | Payer: Medicaid Other | Source: Intra-hospital | Attending: Psychiatry | Admitting: Psychiatry

## 2014-10-14 DIAGNOSIS — F333 Major depressive disorder, recurrent, severe with psychotic symptoms: Secondary | ICD-10-CM

## 2014-10-14 DIAGNOSIS — F332 Major depressive disorder, recurrent severe without psychotic features: Secondary | ICD-10-CM | POA: Diagnosis present

## 2014-10-14 DIAGNOSIS — F1120 Opioid dependence, uncomplicated: Secondary | ICD-10-CM | POA: Diagnosis present

## 2014-10-14 DIAGNOSIS — G4700 Insomnia, unspecified: Secondary | ICD-10-CM | POA: Diagnosis present

## 2014-10-14 DIAGNOSIS — J309 Allergic rhinitis, unspecified: Secondary | ICD-10-CM

## 2014-10-14 DIAGNOSIS — F331 Major depressive disorder, recurrent, moderate: Principal | ICD-10-CM | POA: Diagnosis present

## 2014-10-14 DIAGNOSIS — F329 Major depressive disorder, single episode, unspecified: Secondary | ICD-10-CM | POA: Diagnosis present

## 2014-10-14 DIAGNOSIS — K219 Gastro-esophageal reflux disease without esophagitis: Secondary | ICD-10-CM | POA: Diagnosis present

## 2014-10-14 DIAGNOSIS — Z87891 Personal history of nicotine dependence: Secondary | ICD-10-CM | POA: Diagnosis not present

## 2014-10-14 DIAGNOSIS — F1990 Other psychoactive substance use, unspecified, uncomplicated: Secondary | ICD-10-CM

## 2014-10-14 DIAGNOSIS — R45851 Suicidal ideations: Secondary | ICD-10-CM | POA: Diagnosis present

## 2014-10-14 DIAGNOSIS — I10 Essential (primary) hypertension: Secondary | ICD-10-CM | POA: Diagnosis present

## 2014-10-14 LAB — TSH: TSH: 2.457 u[IU]/mL (ref 0.350–4.500)

## 2014-10-14 MED ORDER — CLONIDINE HCL 0.1 MG PO TABS
0.1000 mg | ORAL_TABLET | Freq: Every day | ORAL | Status: DC
  Administered 2014-10-18: 0.1 mg via ORAL
  Filled 2014-10-14 (×2): qty 1

## 2014-10-14 MED ORDER — TRAZODONE HCL 100 MG PO TABS
50.0000 mg | ORAL_TABLET | Freq: Every evening | ORAL | Status: DC | PRN
  Administered 2014-10-14 – 2014-10-17 (×4): 50 mg via ORAL
  Filled 2014-10-14 (×2): qty 1
  Filled 2014-10-14: qty 7
  Filled 2014-10-14: qty 1
  Filled 2014-10-14: qty 7
  Filled 2014-10-14: qty 1

## 2014-10-14 MED ORDER — DICYCLOMINE HCL 20 MG PO TABS: 20.0000 mg | ORAL_TABLET | Freq: Four times a day (QID) | ORAL | Status: DC | PRN

## 2014-10-14 MED ORDER — CHLORDIAZEPOXIDE HCL 25 MG PO CAPS: 25.0000 mg | ORAL_CAPSULE | Freq: Every day | ORAL | Status: DC

## 2014-10-14 MED ORDER — HYDROCHLOROTHIAZIDE 25 MG PO TABS
25.0000 mg | ORAL_TABLET | Freq: Every day | ORAL | Status: DC
  Administered 2014-10-14 – 2014-10-18 (×5): 25 mg via ORAL
  Filled 2014-10-14 (×7): qty 1

## 2014-10-14 MED ORDER — FESOTERODINE FUMARATE ER 4 MG PO TB24
4.0000 mg | ORAL_TABLET | Freq: Every day | ORAL | Status: DC
  Administered 2014-10-14 – 2014-10-18 (×5): 4 mg via ORAL
  Filled 2014-10-14 (×7): qty 1

## 2014-10-14 MED ORDER — CARBAMAZEPINE ER 200 MG PO TB12
400.0000 mg | ORAL_TABLET | Freq: Two times a day (BID) | ORAL | Status: DC
  Administered 2014-10-14 – 2014-10-18 (×9): 400 mg via ORAL
  Filled 2014-10-14: qty 28
  Filled 2014-10-14 (×4): qty 1
  Filled 2014-10-14: qty 28
  Filled 2014-10-14 (×4): qty 1
  Filled 2014-10-14: qty 56
  Filled 2014-10-14 (×3): qty 1
  Filled 2014-10-14: qty 56

## 2014-10-14 MED ORDER — CLONIDINE HCL 0.1 MG PO TABS
0.1000 mg | ORAL_TABLET | Freq: Two times a day (BID) | ORAL | Status: DC
  Administered 2014-10-14: 0.1 mg via ORAL
  Filled 2014-10-14 (×5): qty 1

## 2014-10-14 MED ORDER — CHLORDIAZEPOXIDE HCL 25 MG PO CAPS: 25.0000 mg | ORAL_CAPSULE | Freq: Four times a day (QID) | ORAL | Status: DC

## 2014-10-14 MED ORDER — ALUM & MAG HYDROXIDE-SIMETH 200-200-20 MG/5ML PO SUSP
30.0000 mL | ORAL | Status: DC | PRN
  Administered 2014-10-16: 30 mL via ORAL
  Filled 2014-10-14: qty 30

## 2014-10-14 MED ORDER — CARVEDILOL 12.5 MG PO TABS
12.5000 mg | ORAL_TABLET | Freq: Two times a day (BID) | ORAL | Status: DC
  Administered 2014-10-14 – 2014-10-18 (×9): 12.5 mg via ORAL
  Filled 2014-10-14 (×13): qty 1

## 2014-10-14 MED ORDER — TOPIRAMATE 25 MG PO TABS
25.0000 mg | ORAL_TABLET | Freq: Every day | ORAL | Status: DC
  Administered 2014-10-14 – 2014-10-18 (×5): 25 mg via ORAL
  Filled 2014-10-14 (×4): qty 1
  Filled 2014-10-14: qty 14
  Filled 2014-10-14 (×2): qty 1

## 2014-10-14 MED ORDER — ADULT MULTIVITAMIN W/MINERALS CH
1.0000 | ORAL_TABLET | Freq: Every day | ORAL | Status: DC
  Administered 2014-10-14 – 2014-10-18 (×5): 1 via ORAL
  Filled 2014-10-14 (×7): qty 1

## 2014-10-14 MED ORDER — METHOCARBAMOL 500 MG PO TABS: 500.0000 mg | ORAL_TABLET | Freq: Three times a day (TID) | ORAL | Status: DC | PRN

## 2014-10-14 MED ORDER — CHLORDIAZEPOXIDE HCL 25 MG PO CAPS: 25.0000 mg | ORAL_CAPSULE | ORAL | Status: DC

## 2014-10-14 MED ORDER — CHLORDIAZEPOXIDE HCL 25 MG PO CAPS: 25.0000 mg | ORAL_CAPSULE | Freq: Three times a day (TID) | ORAL | Status: DC

## 2014-10-14 MED ORDER — MAGNESIUM HYDROXIDE 400 MG/5ML PO SUSP
30.0000 mL | Freq: Every day | ORAL | Status: DC | PRN
  Administered 2014-10-15: 30 mL via ORAL
  Filled 2014-10-14: qty 30

## 2014-10-14 MED ORDER — NAPROXEN 500 MG PO TABS
500.0000 mg | ORAL_TABLET | Freq: Two times a day (BID) | ORAL | Status: DC | PRN
  Administered 2014-10-14 – 2014-10-18 (×7): 500 mg via ORAL
  Filled 2014-10-14 (×7): qty 1

## 2014-10-14 MED ORDER — ALBUTEROL SULFATE HFA 108 (90 BASE) MCG/ACT IN AERS
2.0000 | INHALATION_SPRAY | Freq: Four times a day (QID) | RESPIRATORY_TRACT | Status: DC | PRN
  Administered 2014-10-14: 2 via RESPIRATORY_TRACT
  Filled 2014-10-14: qty 6.7

## 2014-10-14 MED ORDER — VITAMIN B-1 100 MG PO TABS
100.0000 mg | ORAL_TABLET | Freq: Every day | ORAL | Status: DC
  Filled 2014-10-14 (×2): qty 1

## 2014-10-14 MED ORDER — LOPERAMIDE HCL 2 MG PO CAPS: 2.0000 mg | ORAL_CAPSULE | ORAL | Status: DC | PRN

## 2014-10-14 MED ORDER — ONDANSETRON 4 MG PO TBDP: 4.0000 mg | ORAL_TABLET | Freq: Four times a day (QID) | ORAL | Status: DC | PRN

## 2014-10-14 MED ORDER — THIAMINE HCL 100 MG/ML IJ SOLN: 100.0000 mg | Freq: Once | INTRAMUSCULAR | Status: DC

## 2014-10-14 MED ORDER — GABAPENTIN 300 MG PO CAPS
300.0000 mg | ORAL_CAPSULE | Freq: Three times a day (TID) | ORAL | Status: DC
Start: 2014-10-14 — End: 2014-10-18
  Administered 2014-10-14 – 2014-10-18 (×18): 300 mg via ORAL
  Filled 2014-10-14: qty 1
  Filled 2014-10-14 (×2): qty 56
  Filled 2014-10-14 (×8): qty 1
  Filled 2014-10-14: qty 56
  Filled 2014-10-14 (×12): qty 1
  Filled 2014-10-14: qty 56
  Filled 2014-10-14 (×3): qty 1

## 2014-10-14 MED ORDER — ACETAMINOPHEN 325 MG PO TABS
650.0000 mg | ORAL_TABLET | Freq: Four times a day (QID) | ORAL | Status: DC | PRN
  Administered 2014-10-17: 650 mg via ORAL
  Filled 2014-10-14: qty 2

## 2014-10-14 MED ORDER — HYDROXYZINE HCL 25 MG PO TABS: 25.0000 mg | ORAL_TABLET | Freq: Four times a day (QID) | ORAL | Status: DC | PRN

## 2014-10-14 MED ORDER — CLONIDINE HCL 0.1 MG PO TABS
0.1000 mg | ORAL_TABLET | ORAL | Status: AC
  Administered 2014-10-16 – 2014-10-17 (×4): 0.1 mg via ORAL
  Filled 2014-10-14 (×4): qty 1

## 2014-10-14 MED ORDER — BENZTROPINE MESYLATE 0.5 MG PO TABS
0.5000 mg | ORAL_TABLET | ORAL | Status: DC
  Administered 2014-10-14 – 2014-10-18 (×9): 0.5 mg via ORAL
  Filled 2014-10-14 (×6): qty 1
  Filled 2014-10-14: qty 28
  Filled 2014-10-14 (×2): qty 1
  Filled 2014-10-14: qty 28
  Filled 2014-10-14 (×4): qty 1

## 2014-10-14 MED ORDER — RISPERIDONE 1 MG PO TBDP
1.0000 mg | ORAL_TABLET | ORAL | Status: DC
  Administered 2014-10-14 – 2014-10-18 (×8): 1 mg via ORAL
  Filled 2014-10-14 (×8): qty 1
  Filled 2014-10-14: qty 28
  Filled 2014-10-14 (×3): qty 1
  Filled 2014-10-14: qty 28
  Filled 2014-10-14: qty 1

## 2014-10-14 MED ORDER — RISPERIDONE 0.5 MG PO TBDP
0.5000 mg | ORAL_TABLET | ORAL | Status: DC
  Administered 2014-10-14: 0.5 mg via ORAL
  Filled 2014-10-14 (×5): qty 1

## 2014-10-14 MED ORDER — CHLORDIAZEPOXIDE HCL 25 MG PO CAPS: 25.0000 mg | ORAL_CAPSULE | Freq: Four times a day (QID) | ORAL | Status: DC | PRN

## 2014-10-14 MED ORDER — IPRATROPIUM BROMIDE 0.03 % NA SOLN
2.0000 | Freq: Two times a day (BID) | NASAL | Status: DC
  Administered 2014-10-14 – 2014-10-18 (×9): 2 via NASAL
  Filled 2014-10-14: qty 30

## 2014-10-14 MED ORDER — PANTOPRAZOLE SODIUM 40 MG PO TBEC
40.0000 mg | DELAYED_RELEASE_TABLET | Freq: Every day | ORAL | Status: DC
  Administered 2014-10-14 – 2014-10-18 (×5): 40 mg via ORAL
  Filled 2014-10-14 (×7): qty 1

## 2014-10-14 MED ORDER — SERTRALINE HCL 50 MG PO TABS
50.0000 mg | ORAL_TABLET | Freq: Every day | ORAL | Status: DC
  Administered 2014-10-14 – 2014-10-18 (×5): 50 mg via ORAL
  Filled 2014-10-14: qty 14
  Filled 2014-10-14 (×6): qty 1

## 2014-10-14 MED ORDER — CLONIDINE HCL 0.1 MG PO TABS
0.1000 mg | ORAL_TABLET | Freq: Four times a day (QID) | ORAL | Status: AC
  Administered 2014-10-14 – 2014-10-15 (×7): 0.1 mg via ORAL
  Filled 2014-10-14 (×8): qty 1

## 2014-10-14 MED ORDER — HYDROXYZINE HCL 25 MG PO TABS
25.0000 mg | ORAL_TABLET | Freq: Four times a day (QID) | ORAL | Status: AC | PRN
  Administered 2014-10-14 – 2014-10-15 (×2): 25 mg via ORAL
  Filled 2014-10-14 (×2): qty 1

## 2014-10-14 NOTE — Tx Team (Signed)
  Interdisciplinary Treatment Plan Update   Date Reviewed:  10/14/2014  Time Reviewed:  8:08 AM  Progress in Treatment:   Attending groups: Yes Participating in groups: Yes Taking medication as prescribed: Yes  Tolerating medication: Yes Family/Significant other contact made: Yes  Patient understands diagnosis:No   According to conversation with Dr, pt emphasized mental health symptoms while fiance emphasizes substance abuse issues  Discussing patient identified problems/goals with staff: Yes  See initial care plan Medical problems stabilized or resolved: Yes Denies suicidal/homicidal ideation: Yes  In tx team Patient has not harmed self or others: Yes  For review of initial/current patient goals, please see plan of care.  Estimated Length of Stay:  4-5 days  Reason for Continuation of Hospitalization: Depression Hallucinations Medication stabilization Other; describe addiction issues  New Problems/Goals identified:  N/A  Discharge Plan or Barriers:   hopes to get into rehab  Additional Comments:  Pt was released on 09/22/14 and [facility]. "I did not go to [facility] because the day I was d/ced my fiance had a bad fall. She was pretty banged up and I needed to stay home and help here. Than, when she was healed enough, I did not have enough medication to get into [facility]. So I was feeling depressed and suicidal and then I was huffing cans again. So I want to get my meds right again and [facility]."Fiance states he relapsed immediately on aeresol and her prescribed narcotics when he returned home.  Attendees:  Signature: Ivin BootySarama Eappen, MD 10/14/2014 8:08 AM   Signature: Richelle Itood Vinny Taranto, LCSW 10/14/2014 8:08 AM  Signature:  10/14/2014 8:08 AM  Signature: Kathi SimpersSarah Twyman, RN 10/14/2014 8:08 AM  Signature:  10/14/2014 8:08 AM  Signature:  10/14/2014 8:08 AM  Signature:   10/14/2014 8:08 AM  Signature:    Signature:    Signature:    Signature:    Signature:     Signature:      Scribe for Treatment Team:   Richelle Itood Samanthia Howland, LCSW  10/14/2014 8:08 AM

## 2014-10-14 NOTE — H&P (Addendum)
Psychiatric Admission Assessment Adult  Patient Identification:  Martin Singh Date of Evaluation:  10/14/2014 Chief Complaint:Patient states " I was feeling depressed.'    Principal Problem: Opioid use disorder, severe, dependence  Patient Active Problem List   Diagnosis Date Noted  . Major depressive disorder, recurrent severe without psychotic features [F33.2] 10/14/2014  . Opioid use disorder, severe, dependence [F11.20] 10/14/2014  . Substance use disorder [F19.90] 10/14/2014  . 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 Hyperlipidemia [E78.5] 05/21/2008  . GERD [K21.9] 05/21/2008      History of Present Illness:  Martin Singh is a 49 y.o. CM who  voluntarily presented  to [facility] with SI/SA and depression. Pt reported  SI thoughts x1 month, since his discharge from [facility] last month .   Patient seen this AM. Patient reported that he could not go to [facility] as planned last month , because as soon as he got discharged his fiance fell and he had to take care of her. When he eventually decided to go to [facility] , he realized that he did not have 2 weeks supply of medications to get in there , since med[facility]ist did not send him medications on time. Patient also reports that he ran out of his 'voice" medication and started having AH again.   Pt also reports stressors: uncle passed away on the same day that his mother was buried last year . Patient reports that this brought back a lot of negative memories and he started "huffing" again. Patient reported also abusing dilaudid from his fiance . She did keep it away from him this time ,but he had hidden some pills in the past which he had been abusing. Per initial evaluation notes - last use was 10/12/14.    Patient reported having worsening depression since the past few days. Patient currently regrets that he relapsed.   Patient does report having AH of hearing his son's voice.  Patient reports he likes his current medications and wants to continue  the same .Patient takes Tegretol for seizures. However per recent consult notes in chart (neurology) - patient with recent spells /black outs - which are likely psychogenic. But recommendations to continue tegretol.  Patient reports a significant hx of abusing "everything - cocaine ,heroin ,thc etoh" , but he stopped a few years ago. Patient  Has been to detox and rehab in the past , [facility].   Patient with several suicide attempts in the past, atleast 5.    Associated Signs/Synptoms: Depression Symptoms:  depressed mood, difficulty concentrating, SI (Hypo) Manic Symptoms:  None Anxiety Symptoms:  Excessive Worry, Psychotic Symptoms: AH of hearing his son's voice PTSD Symptoms: NA  Psychiatric Specialty Exam: Physical Exam  Constitutional: He is oriented to person, place, and time. He appears well-developed and well-nourished.  HENT:  Head: Normocephalic and atraumatic.  Eyes: Conjunctivae are normal. Pupils are equal, round, and reactive to light.  Neck: Normal range of motion. Neck supple.  Cardiovascular: Normal rate and regular rhythm.   Respiratory: Effort normal and breath sounds normal.  GI: Soft.  Musculoskeletal: Normal range of motion.  Neurological: He is alert and oriented to person, place, and time.  Skin: Skin is warm.  Psychiatric: His speech is normal. His mood appears anxious. His affect is labile. He is agitated. Cognition and memory are normal. He expresses impulsivity. He exhibits a depressed mood. He expresses suicidal ideation.    Review of Systems  Psychiatric/Behavioral: Positive for hallucinations. The patient is nervous/anxious.  Blood pressure 118/100, pulse 65, temperature 97.8 F (36.6 C), temperature source Oral, resp. rate 18, height 5' (1.524 m), weight 79.833 kg (176 lb).Body mass index is 34.37 kg/(m^2).  General Appearance: Casual  Eye Contact::  Fair  Speech:  Normal Rate  Volume:  Normal  Mood:  Anxious and Depressed  Affect:   Congruent  Thought Process:  Coherent  Orientation:  Full (Time, Place, and Person)  Thought Content:  Hallucinations: Auditory   Suicidal Thoughts:  No presented after SI  Homicidal Thoughts:  No  Memory:  Immediate;   Fair Recent;   Fair Remote;   Fair  Judgement:  Impaired  Insight:  Lacking  Psychomotor Activity:  Decreased  Concentration:  Fair  Recall:  Fair  Akathisia:  No  Handed:  Right  AIMS (if indicated):     Assets:  Resilience  Sleep:       Past Psychiatric History: Diagnosis:  Depression and anxiety  Hospitalizations:  [place] years ago,[facility] -2014,2013, 2016   Outpatient Care:  Has been seeing a therapist, Referred to [facility] Last admission  Substance Abuse Care:  Rehab for substance abuse,several times  Self-Mutilation: None  Suicidal Attempts:  5 times ,recently prior to this admission  Violent Behaviors: yes in the past   Past Medical History:   Past Medical History  Diagnosis Date  . Hypertension   . Anxiety and depression   . Osteoarthrosis, unspecified whether generalized or localized, unspecified site   . Irritable bowel syndrome   . GERD (gastroesophageal reflux disease)     h/o esophagitis  . Allergic rhinitis   . Insomnia   . Diverticulosis   . Alcohol abuse 2012  . Polysubstance abuse   . Seizures   . Alcoholic pancreatitis   . Periumbilical hernia 12/13/12  . Fatty liver 10/08/11  . Internal hemorrhoids 03/13/11  . Hiatal hernia 03/13/11  . Asthmatic bronchitis   . Frequent headaches   . 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 Prediabetes 03/01/2014  . Depression   . Anxiety    Seizure history Allergies:   Allergies  Allergen Reactions  . Codeine Anaphylaxis  . Benazepril Cough  . Ativan [Lorazepam] Other (See Comments)    Becomes violent and hallucinates  . Corticosteroids Other (See Comments)    Shaky and high blood pressure  " I can't have too high of a steroid"   PTA Medications: Prescriptions prior to admission  Medication  Sig Dispense Refill Last Dose  . albuterol (PROVENTIL HFA;VENTOLIN HFA) 108 (90 BASE) MCG/ACT inhaler Inhale 2 puffs into the lungs every 6 (six) hours as needed for wheezing or shortness of breath. 3 Inhaler 1 10/13/2014 at Unknown time  . aspirin 81 MG chewable tablet Chew 81 mg by mouth daily.   10/13/2014 at Unknown time  . atenolol (TENORMIN) 100 MG tablet Take 1 tablet (100 mg total) by mouth daily. For high blood pressure 30 tablet 2 10/13/2014 at Unknown time  . benztropine (COGENTIN) 0.5 MG tablet Take 1 tablet (0.5 mg total) by mouth 2 (two) times daily in the am and at bedtime.. For drug induced tremors 30 tablet 0 10/13/2014 at Unknown time  . budesonide-formoterol (SYMBICORT) 160-4.5 MCG/ACT inhaler Inhale 2 puffs into the lungs 2 (two) times daily. 3 Inhaler 1 10/13/2014 at Unknown time  . carbamazepine (TEGRETOL XR) 200 MG 12 hr tablet Take 2 tablets (400 mg total) by mouth 2 (two) times daily. 360 tablet 1 10/13/2014 at Unknown time  . carvedilol (COREG) 12.5 MG tablet Take  1 tablet (12.5 mg total) by mouth 2 (two) times daily with a meal. 60 tablet 3 10/13/2014 at Unknown time  . cloNIDine (CATAPRES) 0.1 MG tablet Take 1 tablet (0.1 mg total) by mouth 2 (two) times daily. 20 tablet 0 10/13/2014 at Unknown time  . diphenhydrAMINE (BENADRYL) 25 MG tablet Take 25 mg by mouth every 6 (six) hours as needed for allergies.   10/13/2014 at Unknown time  . esomeprazole (NEXIUM) 40 MG capsule Take 1 capsule (40 mg total) by mouth daily at 12 noon. 60 capsule 3 10/13/2014 at Unknown time  . fesoterodine (TOVIAZ) 4 MG TB24 tablet Take 4 mg by mouth daily.   10/13/2014 at Unknown time  . gabapentin (NEURONTIN) 300 MG capsule Take 1 capsule (300 mg total) by mouth 4 (four) times daily -  with meals and at bedtime. 90 capsule 0 10/13/2014 at Unknown time  . hydrochlorothiazide (HYDRODIURIL) 25 MG tablet Take 1 tablet (25 mg total) by mouth daily. 30 tablet 3 10/13/2014 at Unknown time  . ibuprofen (ADVIL,MOTRIN)  200 MG tablet Take 600 mg by mouth daily as needed for moderate pain.   Past Week at Unknown time  . ipratropium (ATROVENT) 0.03 % nasal spray Two sprays each nostril 2-3 times daily prn. (Patient taking differently: Place 2 sprays into both nostrils 3 (three) times daily as needed. ) 30 mL 5 10/13/2014 at Unknown time  . Iron Combinations (IRON COMPLEX PO) Take 1 capsule by mouth daily.   10/13/2014 at Unknown time  . nystatin (MYCOSTATIN/NYSTOP) 100000 UNIT/GM POWD Apply topically to affected area 2 (two) times daily for yeast infection (Patient taking differently: Apply 1 g topically 2 (two) times daily as needed. Apply topically to affected area 2 (two) times daily for yeast infection) 1 Bottle 0 10/13/2014 at Unknown time  . omeprazole (PRILOSEC) 40 MG capsule Take 1 capsule (40 mg total) by mouth daily. 30 capsule 5 10/13/2014 at Unknown time  . risperiDONE (RISPERDAL M-TABS) 0.5 MG disintegrating tablet Take 1 tablet (0.5 mg total) by mouth 2 (two) times daily in the am and at bedtime.. For mood stabilizer 30 tablet 0 10/13/2014 at Unknown time  . sertraline (ZOLOFT) 50 MG tablet Take 1 tablet (50 mg total) by mouth daily. For depression 30 tablet 0 10/13/2014 at Unknown time  . topiramate (TOPAMAX) 25 MG tablet Take 1 tablet (25 mg total) by mouth daily. 120 tablet 3 10/13/2014 at Unknown time  . traZODone (DESYREL) 50 MG tablet Take 1 tablet (50 mg total) by mouth at bedtime. For sleep 30 tablet 0 Past Week at Unknown time  . carbamazepine (TEGRETOL) 200 MG tablet Take 1 tablet (200 mg total) by mouth 3 (three) times daily. Take 1 tab in am 1 tab at noon and 2 at HS total 800 mg daily  # 360 with 2 refills (Patient taking differently: Take 200-400 mg by mouth 3 (three) times daily. Take 1 tab in am 1 tab at noon and 2 at HS total) 360 tablet 2 10/13/2014 at Unknown time  . Multiple Vitamin (MULTIVITAMIN WITH MINERALS) TABS tablet Take 1 tablet by mouth daily.   10/13/2014 at Unknown time  . omalizumab  (XOLAIR) 150 MG injection Inject 300 mg into the skin every 28 (twenty-eight) days.   Past Week at Unknown time  . predniSONE (DELTASONE) 5 MG tablet Take 3 tablets (15 mg total) by mouth daily with breakfast. 90 tablet 0 10/13/2014 at Unknown time  . RISPERDAL 0.5 MG tablet   0   .  tetrahydrozoline 0.05 % ophthalmic solution Place 1 drop into both eyes 3 (three) times daily as needed (dry eyes).   10/13/2014 at Unknown time    Previous Psychotropic Medications:  None  Medication/Dose:  Prozac, nortriptyline, atarax     Substance Abuse History in the last 12 months:  no  Consequences of Substance Abuse: NA  Social History:  reports that he quit smoking about 23 years ago. His smokeless tobacco use includes Snuff. He reports that he does not drink alcohol or use illicit drugs. 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 Legal History:went to jail once in the past for traffic violation Hobbies/Interests:denies  Family History:   Family History  Problem Relation Age of Onset  . Heart disease Mother   . Gallbladder disease Mother   . Nephrolithiasis Mother   . Arthritis Mother   . Hyperlipidemia Mother   . Stroke Mother   . Irritable bowel syndrome Mother   . Cancer Mother     vulva  . Colon cancer Neg Hx   . Hypertension Father     moth  . Heart disease Father   . Arthritis Father   . Irritable bowel syndrome Brother     x 2    Results for orders placed or performed during the hospital encounter of 10/14/14 (from the past 72 hour(s))   TSH     Status: None   Collection Time: 10/14/14  6:20 AM  Result Value Ref Range   TSH 2.457 0.350 - 4.500 uIU/mL    Comment: Performed at [facility]   Psychological Evaluations:denies  Assessment:  Patient is a 49 year old CM ,who presented with worsening depression as well as suicidal ideation , presents after huffing duster and abusing dilaudid . Patient was referred to [facility]/Family service/[facility] last admission a month ago ,but did not make it.     Past Medical History  Diagnosis Date  . Hypertension   . Anxiety and depression   . Osteoarthrosis, unspecified whether generalized or localized, unspecified site   . Irritable bowel syndrome   . GERD (gastroesophageal reflux disease)     h/o esophagitis  . Allergic rhinitis   . Insomnia   . Diverticulosis   . Alcohol abuse 2012  . Polysubstance abuse   . Seizures   . Alcoholic pancreatitis   . Periumbilical hernia 12/13/12  . Fatty liver 10/08/11  . Internal hemorrhoids 03/13/11  . Hiatal hernia 03/13/11  . Asthmatic bronchitis   . Frequent headaches   . 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 Prediabetes 03/01/2014  . Depression   . Anxiety     Treatment Plan/Recommendations:  Plan:  Review of chart, vital signs, medications, and notes. 1-Admit for crisis management and stabilization.  Estimated length of stay 5-7 days past his current stay of 0 2-Individual and group therapy encouraged 3-Medication management for depression and anxiety to reduce current symptoms to base line and improve the patient's overall level of functioning:  Medications reviewed with the patient and he stated no untoward effects, home medications reinstated. Will start COWS/clonidine protocol - patient reports abusing dilaudid on a regular basis - but UDS - negative. Will restart  Zoloft 50 mg po daily.  Will continue  Gabapentin to 300 mg po tid and HS, patient reported this was started for pain. Will continue Tegretol as per recommendations from  Neurology.  4-Coping skills for depression and anxiety developing-- 5-Continue crisis stabilization and management 6-Address health issues--monitoring vital signs. 7-Treatment plan in progress to prevent relapse of depression and anxiety 8-Psychosocial education regarding relapse prevention and self-care 8-Health care follow up as needed for any health concerns 9-Call for consult with hospitalist for additional specialty patient services as needed.  Treatment Plan Summary: Daily contact with patient to assess and evaluate symptoms and progress in treatment Medication management Supportive approach/identify stressors/work on coping skils/CBT/optimize treatment with psychotropics/relapse prevention Current Medications:  Current Facility-Administered Medications  Medication Dose Route Frequency Provider Last Rate Last Dose  . acetaminophen (TYLENOL) tablet 650 mg  650 mg Oral Q6H PRN Dayna BarkerIjeoma Eugenia Nwaeze, NP      . albuterol (PROVENTIL HFA;VENTOLIN HFA) 108 (90 BASE) MCG/ACT inhaler 2 puff  2 puff Inhalation Q6H PRN Dayna BarkerIjeoma Eugenia Nwaeze, NP      . alum & mag hydroxide-simeth (MAALOX/MYLANTA) 200-200-20 MG/5ML suspension 30 mL  30 mL Oral Q4H PRN Dayna BarkerIjeoma Eugenia Nwaeze, NP      . benztropine (COGENTIN) tablet 0.5 mg  0.5 mg Oral BH-qamhs Dayna BarkerIjeoma Eugenia Nwaeze, NP   0.5 mg at 10/14/14 [id number]  . carbamazepine (TEGRETOL XR) 12 hr tablet 400 mg  400 mg Oral BID Dayna BarkerIjeoma Eugenia Nwaeze, NP   400 mg at 10/14/14 [id number]  . carvedilol (COREG) tablet 12.5 mg  12.5 mg Oral BID WC Dayna BarkerIjeoma Eugenia Nwaeze, NP   12.5 mg at 10/14/14 [id number]  . cloNIDine (CATAPRES) tablet 0.1 mg  0.1 mg Oral QID Jomarie LongsSaramma Niesha Bame, MD       Followed by  . [START ON 10/16/2014] cloNIDine (CATAPRES) tablet 0.1 mg  0.1 mg Oral BH-qamhs Jomarie LongsSaramma Taneisha Fuson, MD       Followed by  . [START ON 10/18/2014] cloNIDine (CATAPRES) tablet 0.1 mg  0.1 mg Oral QAC breakfast Iveth Heidemann, MD      . dicyclomine (BENTYL) tablet 20 mg  20 mg Oral Q6H PRN Jomarie LongsSaramma  Anvitha Hutmacher, MD      . fesoterodine (TOVIAZ) tablet 4 mg  4 mg Oral Daily Dayna BarkerIjeoma Eugenia Nwaeze, NP   4 mg at 10/14/14 0829  . gabapentin (NEURONTIN) capsule 300 mg  300 mg Oral TID WC & HS Dayna BarkerIjeoma Eugenia Nwaeze, NP   300 mg at 10/14/14 1211  . hydrochlorothiazide (HYDRODIURIL) tablet 25 mg  25 mg Oral Daily Dayna BarkerIjeoma Eugenia Nwaeze, NP   25 mg at 10/14/14 [id number]  . hydrOXYzine (ATARAX/VISTARIL) tablet 25 mg  25 mg Oral Q6H PRN Dayna BarkerIjeoma Eugenia Nwaeze, NP      . ipratropium (ATROVENT) 0.03 % nasal spray 2 spray  2 spray Each Nare BID Dayna BarkerIjeoma Eugenia Nwaeze, NP   2 spray at 10/14/14 1211  . loperamide (IMODIUM) capsule 2-4 mg  2-4 mg Oral PRN Jomarie LongsSaramma Kiamesha Samet, MD      . magnesium hydroxide (MILK OF MAGNESIA) suspension 30 mL  30 mL Oral Daily PRN Dayna BarkerIjeoma Eugenia Nwaeze, NP      . methocarbamol (ROBAXIN) tablet 500 mg  500 mg Oral Q8H PRN Jomarie LongsSaramma Ivelise Castillo, MD      . multivitamin with minerals tablet 1 tablet  1 tablet Oral Daily Dayna BarkerIjeoma Eugenia Nwaeze, NP   1 tablet at 10/14/14 0829  . naproxen (NAPROSYN) tablet 500 mg  500 mg Oral BID PRN Jomarie LongsSaramma Tulip Meharg, MD      .  ondansetron (ZOFRAN-ODT) disintegrating tablet 4 mg  4 mg Oral Q6H PRN Jomarie Longs, MD      . pantoprazole (PROTONIX) EC tablet 40 mg  40 mg Oral Daily Dayna Barker, NP   40 mg at 10/14/14 0829  . risperiDONE (RISPERDAL M-TABS) disintegrating tablet 1 mg  1 mg Oral BH-qamhs Ilisha Blust, MD      . sertraline (ZOLOFT) tablet 50 mg  50 mg Oral Daily Dayna Barker, NP   50 mg at 10/14/14 0829  . thiamine (B-1) injection 100 mg  100 mg Intramuscular Once Dayna Barker, NP   100 mg at 10/14/14 0515  . topiramate (TOPAMAX) tablet 25 mg  25 mg Oral Daily Dayna Barker, NP   25 mg at 10/14/14 0829  . [START ON 10/15/2014] traZODone (DESYREL) tablet 50 mg  50 mg Oral QHS PRN Dayna Barker, NP        Observation Level/Precautions:  15 minute checks  Laboratory:  Reviewed labs from ED  Psychotherapy:  Individual and  group therapy  Medications:  See PTA above  Consultations:  None  Discharge Concerns:  None    Estimated LOS:  5-7 days  Other:     I certify that inpatient services furnished can reasonably be expected to improve the patient's condition.   Montrey Buist MD  , 2/18/20161:38 PM

## 2014-10-14 NOTE — Progress Notes (Signed)
Nutrition Brief Note  Patient identified on the Malnutrition Screening Tool (MST) Report  Pt's weight is stable from previous admission when RD last saw him (1/20). Pt's UBW is 160 lb.   Wt Readings from Last 15 Encounters:  10/14/14 176 lb (79.833 kg)  10/05/14 180 lb (81.647 kg)  09/29/14 181 lb 3.2 oz (82.192 kg)  09/15/14 176 lb (79.833 kg)  09/07/14 179 lb 9.6 oz (81.466 kg)  09/07/14 180 lb 1.9 oz (81.702 kg)  09/02/14 181 lb 4.8 oz (82.237 kg)  08/17/14 178 lb 12.8 oz (81.103 kg)  08/10/14 182 lb (82.555 kg)  07/28/14 180 lb (81.647 kg)  07/15/14 180 lb (81.647 kg)  07/08/14 186 lb 3.2 oz (84.46 kg)  06/07/14 179 lb 4.8 oz (81.33 kg)  05/28/14 186 lb 8 oz (84.596 kg)  05/06/14 186 lb 6.4 oz (84.55 kg)    Body mass index is 34.37 kg/(m^2). Patient meets criteria for obesity based on current BMI.   Diet Order: Diet regular Pt is also offered choice of unit snacks mid-morning and mid-afternoon.  Pt is eating as desired.   Labs and medications reviewed.   No nutrition interventions warranted at this time. If nutrition issues arise, please consult RD.   Martin FrancoLindsey Antoino Westhoff, MS, RD, LDN Pager: [phone] After Hours Pager: [phone]

## 2014-10-14 NOTE — B[facility] Counselor (Addendum)
Adult Psychosocial Assessment Update Interdisciplinary Team  Previous [facility] admissions/discharges:  Admissions Discharges  Date: 09/14/14 Date:  Date: 03/17/13 Date:  Date: 5/13 Date:  Date: 4/13 Date:  Date: Date:   Changes since the last Psychosocial Assessment (including adherence to outpatient mental health and/or substance abuse treatment, situational issues contributing to decompensation and/or relapse). Pt was released on 09/22/14 and [facility]. "I did not go to [facility] because the day I was d/ced my fiance had a bad fall.  She was pretty banged up and I needed to stay home and help here.  Than, when she was healed enough, I did not have enough medication to get into [facility].  So I was feeling depressed and suicidal and then I was huffing cans again. So I want to get my meds right again and [facility]."             Discharge Plan 1. Will you be returning to the same living situation after discharge?   Yes: unknown No:      If no, what is your plan?    Pt states he wants to get into [facility]  If not [facility], another facility is OK. [facility]. He was given phone numbers of several [facility] and [facility].       2. Would you like a referral for services when you are discharged? Yes:     If yes, for what services?  No:       [facility]        Summary and Recommendations (to be completed by the evaluator) Spoke to pt's fiance, who stated that what he related was true, but added that he relapsed on aeresol cans the same day that he got home, so meds were not effective. She also suspects he had hidden some Hydrocodone in the home that he had stolen from her earlier, and was using that as well.  Added that he is scarey when he is using, and that she is not OK with him coming home unless he gets help from longer term treatment somewhere. I let her know about his options, both  locally and further away.  In the meantime, he can benefit from crises stabilization, sobriety period, medication management, therapeutic milieu and referral for services.                       Signature:  Ida Rogueorth, Emrie Gayle B, 10/14/2014 10:46 AM

## 2014-10-14 NOTE — B[facility] Group Notes (Signed)
B[facility] Group Notes:  (Nursing/MHT/Case Management/Adjunct)  Date:  10/14/2014  Time:  0930  Type of Therapy:  Nurse Education  Participation Level:  Active  Participation Quality:  Appropriate and Attentive  Affect:  Appropriate  Cognitive:  Alert and Appropriate  Insight:  Appropriate and Good  Engagement in Group:  Engaged  Modes of Intervention:  Problem-solving and Support  Summary of Progress/Problems: Participated with Martin Singh  Martin Singh 10/14/2014, 11:28 AM

## 2014-10-14 NOTE — Progress Notes (Signed)
Patient ID: Martin Singh, male   DOB: 10/21/1965, 49 y.o.   MRN: [id number]  D: Patient pleasant and smiling on approach. Patient talked about he got home and was hearing voices again. States [facility] but didn't have enough medications to go there. Reports he has had some passive SI on and off but denies at present. Reports Risperdal working well for him this am.  A: Staff will monitor on q 15 minute checks, follow treatment plan, and give meds as ordered R: Took all medications without issue. Attended morning group.

## 2014-10-14 NOTE — BHH Suicide Risk Assessment (Addendum)
[facility]   Nursing information obtained from:    Demographic factors:    Current Mental Status:    Loss Factors:    Historical Factors:    Risk Reduction Factors:    Total Time spent with patient: 30 minutes Principal Problem: Opioid use disorder, severe, dependence Diagnosis:   Patient Active Problem List   Diagnosis Date Noted  . Major depressive disorder, recurrent severe without psychotic features [F33.2] 10/14/2014  . Opioid use disorder, severe, dependence [F11.20] 10/14/2014  . Substance use disorder [F19.90] 10/14/2014  . 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 Hyperlipidemia [E78.5] 05/21/2008  . GERD [K21.9] 05/21/2008     Continued Clinical Symptoms:  Alcohol Use Disorder Identification Test Final Score (AUDIT): 4 The "Alcohol Use Disorders Identification Test", Guidelines for Use in Primary Care, Second Edition.  World Science writerHealth Organization [facility](WHO). Score between 0-7:  no or low risk or alcohol related problems. Score between 8-15:  moderate risk of alcohol related problems. Score between 16-19:  high risk of alcohol related problems. Score 20 or above:  warrants further diagnostic evaluation for alcohol dependence and treatment.   CLINICAL FACTORS:   Alcohol/Substance Abuse/Dependencies Previous Psychiatric Diagnoses and Treatments   Musculoskeletal: Strength & Muscle Tone: within normal limits Gait & Station: normal Patient leans: N/A  Psychiatric Specialty Exam: Physical Exam  ROS  Blood pressure 118/100, pulse 65, temperature 97.8 F (36.6 C), temperature source Oral, resp. rate 18, height 5' (1.524 m), weight 79.833 kg (176 lb).Body mass index is 34.37 kg/(m^2).  Please see H&P.  SUICIDE RISK:   Moderate:  Frequent suicidal ideation with limited intensity, and duration, some specificity in terms of plans, no associated intent, good self-control, limited dysphoria/symptomatology, some risk factors present, and identifiable  protective factors, including available and accessible social support.  PLAN OF CARE: Please see H&P.   Medical Decision Making:  Review or order clinical lab tests (1), Decision to obtain old records (1), Established Problem, Worsening (2), Review or order medicine tests (1), Review of Medication Regimen & Side Effects (2) and Review of New Medication or Change in Dosage (2)  I certify that inpatient services furnished can reasonably be expected to improve the patient's condition.   Kadian Barcellos MD 10/14/2014, 2:27 PM

## 2014-10-14 NOTE — BHH Group Notes (Signed)
B[facility] Group Notes:  (Counselor/Nursing/MHT/Case Management/Adjunct)  10/14/2014 1:15PM  Type of Therapy:  Group Therapy  Participation Level:  Active  Participation Quality:  Appropriate  Affect:  Flat  Cognitive:  Oriented  Insight:  Improving  Engagement in Group:  Limited  Engagement in Therapy:  Limited  Modes of Intervention:  Discussion, Exploration and Socialization  Summary of Progress/Problems: The topic for group was balance in life.  Pt participated in the discussion about when their life was in balance and out of balance and how this feels.  Pt discussed ways to get back in balance and short term goals they can work on to get where they want to be. Identified the loss of his son as something that has gotten him down.  "I messed things up with my alcoholism, and so I gave custody to my brother."  Martin FlesherWent on to blame his brother for keeping him away, but took no responsibility for his own actions, including his on-going addiction issues.  Talked about using beading as a coping tool.   Martin Singh, Martin Singh 10/14/2014 12:27 PM

## 2014-10-14 NOTE — Progress Notes (Signed)
Pt is a 49 yr old male admitted for SI and increased depression. Pt was "huffing" cans. Pt also endorses AVH with increased anxiety. Pt's hallucinations are not command in nature. Pt reports the recent passing of his uncle that was cremated on the anniversary of his mom's death. Pt is also dealing with financial stress. Pt is seeking placement into [facility]. [facility] not [facility] in order on arrival. Pt reports a hx of ETOH abuse. Pt reports being sober from alcohol for 3 years. Pt is currently abusing his fiancee Dilaudid pills. Pt reports taking #5 4-mg pills of Dilaudid daily. Pt's UDS was (-) on admission. Pt is currently having on/off SI. Pt verbally contracts for safety.

## 2014-10-14 NOTE — Tx Team (Signed)
Initial Interdisciplinary Treatment Plan   PATIENT STRESSORS: Financial difficulties Loss of uncle Substance abuse   PATIENT STRENGTHS: Ability for insight Average or above average intelligence General fund of knowledge Motivation for treatment/growth Supportive family/friends   PROBLEM LIST: Problem List/Patient Goals Date to be addressed Date deferred Reason deferred Estimated date of resolution  "[facility]" 2/18     SI      Depression      Anxiety      SA                               DISCHARGE CRITERIA:  Ability to meet basic life and health needs Improved stabilization in mood, thinking, and/or behavior Motivation to continue treatment in a less acute level of care Need for constant or close observation no longer present Reduction of life-threatening or endangering symptoms to within safe limits Verbal commitment to aftercare and medication compliance  PRELIMINARY DISCHARGE PLAN: Attend aftercare/continuing care group Attend 12-step recovery group  PATIENT/FAMIILY INVOLVEMENT: This treatment plan has been presented to and reviewed with the patient, Argel V Henslee.  The patient and family have been given the opportunity to ask questions and make suggestions.  Kristianna Saperstein A 10/14/2014, 7:12 AM

## 2014-10-14 NOTE — BHH Suicide Risk Assessment (Signed)
B[facility] INPATIENT:  Family/Significant Other Suicide Prevention Education  Suicide Prevention Education:  Education Completed; Martin Singh, fiance, [id number] [id number]  has been identified by the patient as the family member/significant other with whom the patient will be residing, and identified as the person(s) who will aid the patient in the event of a mental health crisis (suicidal ideations/suicide attempt).  With written consent from the patient, the family member/significant other has been provided the following suicide prevention education, prior to the and/or following the discharge of the patient.  The suicide prevention education provided includes the following:  Suicide risk factors  Suicide prevention and interventions  National Suicide Hotline telephone number  [facility] assessment telephone number  [facility] Emergency Assistance 911  [facility] and/or Residential Mobile Crisis Unit telephone number  Request made of family/significant other to:  Remove weapons (e.g., guns, rifles, knives), all items previously/currently identified as safety concern.    Remove drugs/medications (over-the-counter, prescriptions, illicit drugs), all items previously/currently identified as a safety concern.  The family member/significant other verbalizes understanding of the suicide prevention education information provided.  The family member/significant other agrees to remove the items of safety concern listed above.  Daryel Geraldorth, Martin Singh 10/14/2014, 3:02 PM

## 2014-10-14 NOTE — Progress Notes (Addendum)
D:Patient in his room in bed on approch.  Patient states he had a good day.  Patient state she is here to get all of his medications [facility]  Patient bright in approach and engages in conversation with Clinical research associatewriter.  Patient states his goal is to take his medications.  Patient states when he does not take his mediations he starts hearing voices. Patient denies SI/HI and states he is having auditory hallucinations.  Patient verbally contracts for safety.   A: Staff to monitor Q 15 mins for safety.  Encouragement and support offered.  Scheduled medications administered per orders.  Prn medications administered tonight.  See MAR. R: Patient remains safe on the unit.  Patient attended group tonight.  Patient visible on the unit and interacting with peers.  Patient taking administered medications.

## 2014-10-15 DIAGNOSIS — F331 Major depressive disorder, recurrent, moderate: Secondary | ICD-10-CM | POA: Diagnosis present

## 2014-10-15 MED ORDER — CLOTRIMAZOLE 1 % EX CREA
TOPICAL_CREAM | Freq: Two times a day (BID) | CUTANEOUS | Status: DC
  Administered 2014-10-15 – 2014-10-18 (×6): via TOPICAL
  Filled 2014-10-15: qty 15

## 2014-10-15 NOTE — Progress Notes (Signed)
Patient ID: Martin Singh, male   DOB: Sep 01, 1965, 49 y.o.   MRN: [id number] Patient ID: Martin Singh, male   DOB: 1965/09/19, 49 y.o.   MRN: [id number] [facility] MD Progress Note  10/15/2014 1:20 PM Martin Singh  MRN:  [id number] Subjective: Patient states "I am feeling better."   Objective: Patient seen and chart reviewed.Patient discussed with treatment team. Patient today appears to be less anxious ,denies any withdrawal sx. Reports clonidine helps and that he is better able to cope with his thoughts. Patient with significant hx of "huffing', opioid abuse . Patient with better insight in to his substance abuse ,is motivated to get help.CSW will work on referral process.  Patient denies any SI/HI/AH/VH today.  Per staff ,patient has been attending group activities and is compliant on medications.   Principal Problem: Opioid use disorder, severe, dependence  Diagnosis:   Primary Psychiatric Diagnosis: Opioid use disorder,severe   Secondary Psychiatric Diagnosis: MDD, recurrent moderate  Other substance use disorder ,severe (Huffing) R/O Somatic symptoms disorder with attacks or seizures  Non Psychiatric Diagnosis:  See PMH Patient Active Problem List   Diagnosis Date Noted  . MDD (major depressive disorder), recurrent episode, moderate [F33.1] 10/15/2014  . Opioid use disorder, severe, dependence [F11.20] 10/14/2014  . Substance use disorder [F19.90] 10/14/2014  . 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 Hyperlipidemia [E78.5] 05/21/2008  . GERD [K21.9] 05/21/2008   Total Time spent with patient: 30 minutes   Past Medical History:  Past Medical History  Diagnosis Date  . Hypertension   . Anxiety and depression   . Osteoarthrosis, unspecified whether generalized or localized, unspecified site   . Irritable bowel syndrome   . GERD (gastroesophageal reflux disease)     h/o esophagitis  . Allergic rhinitis   . Insomnia   . Diverticulosis   . Alcohol abuse 2012  .  Polysubstance abuse   . Seizures   . Alcoholic pancreatitis   . Periumbilical hernia 12/13/12  . Fatty liver 10/08/11  . Internal hemorrhoids 03/13/11  . Hiatal hernia 03/13/11  . Asthmatic bronchitis   . Frequent headaches   . 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 Prediabetes 03/01/2014  . Depression   . Anxiety     Past Surgical History  Procedure Laterality Date  . Tibula surgery Right 2002   Family History:  Family History  Problem Relation Age of Onset  . Heart disease Mother   . Gallbladder disease Mother   . Nephrolithiasis Mother   . Arthritis Mother   . Hyperlipidemia Mother   . Stroke Mother   . Irritable bowel syndrome Mother   . Cancer Mother     vulva  . Colon cancer Neg Hx   . Hypertension Father     moth  . Heart disease Father   . Arthritis Father   . Irritable bowel syndrome Brother     x 2   Social History:  History  Alcohol Use No    Comment: no alcohol in three years; drank 35 years alcohol, quit 02/2012     History  Drug Use No    Comment: (denies current use) benzos- ; hx of THC, cocaine, uppers/downers    History   Social History  . Marital Status: Divorced    Spouse Name: N/A  . Number of Children: 1  . Years of Education: N/A   Occupational History  . unemployed-- apllying for disability    Social History Main Topics  . Smoking status: Former Smoker -- 1.50  packs/day for 18 years    Quit date: 08/28/1991  . Smokeless tobacco: Current User    Types: Snuff     Comment: currently uses snuff.11/17/13  . Alcohol Use: No     Comment: no alcohol in three years; drank 35 years alcohol, quit 02/2012  . Drug Use: No     Comment: (denies current use) benzos- ; hx of THC, cocaine, uppers/downers  . Sexual Activity:    Partners: Female     Comment: no birth control   Other Topics Concern  . None   Social History Narrative   Lives w/ fiancee    Additional History:    Sleep: Fair  Appetite:  Fair    Musculoskeletal: Strength &  Muscle Tone: within normal limits Gait & Station: normal Patient leans: N/A   Psychiatric Specialty Exam: Physical Exam  Vitals reviewed. Skin: Rash noted.    Review of Systems  Psychiatric/Behavioral: Positive for depression. The patient is nervous/anxious.     Blood pressure 135/97, pulse 70, temperature 97.3 F (36.3 C), temperature source Oral, resp. rate 18, height 5' (1.524 m), weight 79.833 kg (176 lb), SpO2 100 %.Body mass index is 34.37 kg/(m^2).  General Appearance: Fairly Groomed  Patent attorney::  Fair  Speech:  Normal Rate  Volume:  Normal  Mood:  Anxious and Depressed improving  Affect:  Congruent  Thought Process:  Coherent  Orientation:  Full (Time, Place, and Person)  Thought Content:  WDL  Suicidal Thoughts:  No  Homicidal Thoughts:  No  Memory:  Immediate;   Fair Recent;   Fair Remote;   Fair  Judgement:  Impaired  Insight:  Shallow  Psychomotor Activity:  Normal  Concentration:  Fair  Recall:  [employer] of Knowledge:Fair  Language: Fair  Akathisia:  No  Handed:  Right  AIMS (if indicated):     Assets:  Communication Skills  ADL's:  Intact  Cognition: WNL  Sleep:  Number of Hours: 6.5     Current Medications: Current Facility-Administered Medications  Medication Dose Route Frequency Provider Last Rate Last Dose  . acetaminophen (TYLENOL) tablet 650 mg  650 mg Oral Q6H PRN Dayna Barker, NP      . albuterol (PROVENTIL HFA;VENTOLIN HFA) 108 (90 BASE) MCG/ACT inhaler 2 puff  2 puff Inhalation Q6H PRN Dayna Barker, NP   2 puff at 10/14/14 2200  . alum & mag hydroxide-simeth (MAALOX/MYLANTA) 200-200-20 MG/5ML suspension 30 mL  30 mL Oral Q4H PRN Dayna Barker, NP      . benztropine (COGENTIN) tablet 0.5 mg  0.5 mg Oral BH-qamhs Dayna Barker, NP   0.5 mg at 10/15/14 0801  . carbamazepine (TEGRETOL XR) 12 hr tablet 400 mg  400 mg Oral BID Dayna Barker, NP   400 mg at 10/15/14 0801  . carvedilol (COREG) tablet  12.5 mg  12.5 mg Oral BID WC Dayna Barker, NP   12.5 mg at 10/15/14 0801  . cloNIDine (CATAPRES) tablet 0.1 mg  0.1 mg Oral QID Jomarie Longs, MD   0.1 mg at 10/15/14 1202   Followed by  . [START ON 10/16/2014] cloNIDine (CATAPRES) tablet 0.1 mg  0.1 mg Oral BH-qamhs Jomarie Longs, MD       Followed by  . [START ON 10/18/2014] cloNIDine (CATAPRES) tablet 0.1 mg  0.1 mg Oral QAC breakfast Bonnita Newby, MD      . dicyclomine (BENTYL) tablet 20 mg  20 mg Oral Q6H PRN Jomarie Longs, MD      .  fesoterodine (TOVIAZ) tablet 4 mg  4 mg Oral Daily Dayna Barker, NP   4 mg at 10/15/14 0801  . gabapentin (NEURONTIN) capsule 300 mg  300 mg Oral TID WC & HS Dayna Barker, NP   300 mg at 10/15/14 1202  . hydrochlorothiazide (HYDRODIURIL) tablet 25 mg  25 mg Oral Daily Dayna Barker, NP   25 mg at 10/15/14 0801  . hydrOXYzine (ATARAX/VISTARIL) tablet 25 mg  25 mg Oral Q6H PRN Dayna Barker, NP   25 mg at 10/15/14 0805  . ipratropium (ATROVENT) 0.03 % nasal spray 2 spray  2 spray Each Nare BID Dayna Barker, NP   2 spray at 10/15/14 0800  . loperamide (IMODIUM) capsule 2-4 mg  2-4 mg Oral PRN Jomarie Longs, MD      . magnesium hydroxide (MILK OF MAGNESIA) suspension 30 mL  30 mL Oral Daily PRN Dayna Barker, NP   30 mL at 10/15/14 1042  . methocarbamol (ROBAXIN) tablet 500 mg  500 mg Oral Q8H PRN Jomarie Longs, MD      . multivitamin with minerals tablet 1 tablet  1 tablet Oral Daily Dayna Barker, NP   1 tablet at 10/15/14 0801  . naproxen (NAPROSYN) tablet 500 mg  500 mg Oral BID PRN Jomarie Longs, MD   500 mg at 10/14/14 2200  . ondansetron (ZOFRAN-ODT) disintegrating tablet 4 mg  4 mg Oral Q6H PRN Jomarie Longs, MD      . pantoprazole (PROTONIX) EC tablet 40 mg  40 mg Oral Daily Dayna Barker, NP   40 mg at 10/15/14 0801  . risperiDONE (RISPERDAL M-TABS) disintegrating tablet 1 mg  1 mg Oral BH-qamhs Shylah Dossantos, MD   1 mg at  10/15/14 0801  . sertraline (ZOLOFT) tablet 50 mg  50 mg Oral Daily Dayna Barker, NP   50 mg at 10/15/14 0801  . thiamine (B-1) injection 100 mg  100 mg Intramuscular Once Dayna Barker, NP   100 mg at 10/14/14 0515  . topiramate (TOPAMAX) tablet 25 mg  25 mg Oral Daily Dayna Barker, NP   25 mg at 10/15/14 0801  . traZODone (DESYREL) tablet 50 mg  50 mg Oral QHS PRN Dayna Barker, NP   50 mg at 10/14/14 2200    Lab Results:  Results for orders placed or performed during the hospital encounter of 10/14/14 (from the past 48 hour(s))  TSH     Status: None   Collection Time: 10/14/14  6:20 AM  Result Value Ref Range   TSH 2.457 0.350 - 4.500 uIU/mL    Comment: Performed at [facility]    Physical Findings: AIMS: Facial and Oral Movements Muscles of Facial Expression: None, normal Lips and Perioral Area: None, normal Jaw: None, normal Tongue: None, normal,Extremity Movements Upper (arms, wrists, hands, fingers): None, normal Lower (legs, knees, ankles, toes): None, normal, Trunk Movements Neck, shoulders, hips: None, normal, Overall Severity Severity of abnormal movements (highest score from questions above): None, normal Incapacitation due to abnormal movements: None, normal Patient's awareness of abnormal movements (rate only patient's report): No Awareness, Dental Status Current problems with teeth and/or dentures?: No Does patient usually wear dentures?: No  CIWA:  CIWA-Ar Total: 0 COWS:  COWS Total Score: 1     Assessment; Patient with significant hx of dilaudid abuse as well as huffing ,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 Patient currently showing some progress on current medications.   Treatment Plan  Summary: Daily contact with patient to assess and evaluate symptoms and progress in treatment, Medication management.  Will continue COWS/clonidine protocol - patient reports abusing dilaudid on a regular basis - but UDS  - negative. Will continue Zoloft 50 mg po daily.  Will continue Gabapentin to 300 mg po tid and HS, patient reported this was started for pain. Will continue Tegretol as per recommendations from Neurology. Will continue Risperdal 1 mg po bid . Will continue Trazodone 50 mg po qhs for sleep.   CSW will work on referral to substance abuse program.   Medical Decision Making:  Established Problem, Stable/Improving (1), Review of Psycho-Social Stressors (1), Review or order clinical lab tests (1) and Review of Medication Regimen & Side Effects (2) Problem Points:  Established problem, stable/improving (1), Review of last therapy session (1) and Review of psycho-social stressors (1) Data Points:  Review or order medicine tests (1) Review and summation of old records (2) Review of medication regiment & side effects (2) Review of new medications or change in dosage (2)  Jenice Leiner MD 10/15/2014, 1:20 PM

## 2014-10-15 NOTE — Progress Notes (Signed)
Pt presents with pleasant mood, affect congruent. Martin Singh reports he is ''fine. '' stating '' my nerves is really the only thing, and [facility]. I know what I've been doing , I know what I need to do to get better, just ain't been doing it. '' he denies any withdrawal symptoms, and reports sleeping and eating well. He also reports risperdal is helpful for his mood stating '' since being on that disolving pill my voices have gone. i feel much better. '' in no acute distress at this time. Will continue to monitor q 15 minutes for safety

## 2014-10-15 NOTE — [facility] Group Notes (Signed)
[facility]   10/15/2014 10:40 AM  Participation Quality:  Engaged  Mood/Affect:  Appropriate  Depression Rating:  3  Anxiety Rating:  4  Thoughts of Suicide:  No Will you contract for safety?   NA  Current AVH:  No  Plan for Discharge/Comments:  Continues to be singularly focused on [facility].  Believes he can get in there, and is not interested in calling any other numbers until he knows for sure about [facility]'s decision.  Denies symptoms.  Upbeat and positive.  Transportation Means: fiance  Supports: fiance  [place], [place] University[employer]ney Singh

## 2014-10-15 NOTE — B[facility] Group Notes (Signed)
[facility] LCSW Group Therapy  10/15/2014  1:05 PM  Type of Therapy:  Group therapy  Participation Level:  Active  Participation Quality:  Attentive  Affect:  Flat  Cognitive:  Oriented  Insight:  Limited  Engagement in Therapy:  Limited  Modes of Intervention:  Discussion, Socialization  Summary of Progress/Problems:  Chaplain was here to lead a group on themes of hope and courage.  "I'm carrying a lot of luggage.  It's too heavy, and I can't handle it anymore.  So i have to give some up.  I can turn some over to my higher power to take some of the load away.  There are many choices I can make.  I have been making the wrong ones.  But I want to make the right ones going forward."  Talked about resentment and guilt that are weighing him down.    Daryel Geraldorth, Zia Najera B 10/15/2014 1:32 PM

## 2014-10-15 NOTE — Telephone Encounter (Signed)
LVM for pt to call back.

## 2014-10-16 MED ORDER — FLUTICASONE PROPIONATE 50 MCG/ACT NA SUSP
1.0000 | Freq: Every day | NASAL | Status: DC
  Administered 2014-10-16 – 2014-10-18 (×3): 1 via NASAL
  Filled 2014-10-16: qty 16

## 2014-10-16 MED ORDER — BUDESONIDE-FORMOTEROL FUMARATE 160-4.5 MCG/ACT IN AERO
2.0000 | INHALATION_SPRAY | Freq: Two times a day (BID) | RESPIRATORY_TRACT | Status: DC | PRN
  Administered 2014-10-16 – 2014-10-18 (×5): 2 via RESPIRATORY_TRACT
  Filled 2014-10-16: qty 6

## 2014-10-16 NOTE — BHH Group Notes (Signed)
[facility] Group Notes:  (Nursing/MHT/Case Management/Adjunct)  Date:  10/16/2014  Time:  10:33 AM  Type of Therapy:  Psychoeducational Skills  Participation Level:  Active  Participation Quality:  Appropriate  Affect:  Appropriate  Cognitive:  Appropriate  Insight:  Appropriate  Engagement in Group:  Engaged  Modes of Intervention:  Discussion and Education  Summary of Progress/Problems:pt goal to take medications  Malva LimesStrader, Nellie Pester 10/16/2014, 10:33 AM

## 2014-10-16 NOTE — Progress Notes (Signed)
Pt presents with pleasant mood, affect congruent again today. Martin Singh reports he is ''fine. '' stating '' I'm doing all right, got some good breakfast this morning. I do need my symbicort and my sinuses are bothering me can I get some Flonase too? '' Pt denies any withdrawal symptoms, and reports sleeping and eating well again today.  in no acute distress at this time. Relayed above requests to Dr. Elna BreslowEappen. Will continue to monitor q 15 minutes for safety

## 2014-10-16 NOTE — Progress Notes (Signed)
D:Patient cheerful and pleasant. Stated "I am feeling better but will like to go to [facility] to continue with the treatment". Patient denies pain, SI, AH/VH at this time. Patient requested sleeping pill to "help me get some good sleep" patient made no new complain at this time.  A: support and encouragement given to patient., patient encouraged to continue and be compliant with the treatment plan; and to verbalize needs to staff. Due medications given as ordered. Every 15 minutes check for safety maintained. Will continue to monitor patient for safety and stability. R: Patient receptive to nursing intervention.

## 2014-10-16 NOTE — Progress Notes (Signed)
The focus of this group is to help patients review their daily goal of treatment and discuss progress on daily workbooks. Pt attended the evening group session and responded to all discussion prompts from the Writer. Pt shared that today was a good day, the highlight of which was seeing his fiancee and going outside for recreation time. Pt's only additional request from Nursing Staff this evening was for towels, which were given to him following group. Pt's affect was appropriate.

## 2014-10-16 NOTE — Progress Notes (Signed)
Patient ID: Martin Singh, male   DOB: Aug 26, 1966, 49 y.o.   MRN: [id number] Patient ID: Martin Singh, male   DOB: 05-25-1966, 49 y.o.   MRN: [id number] [facility] MD Progress Note  10/16/2014 1:24 PM JARREL MESSMAN  MRN:  [id number] Subjective: Patient states "I am OK."   Objective: Patient seen and chart reviewed.Patient discussed with treatment team. Patient today appears to be improving. Reports anxiety as well as withdrawal sx as improving. DOes report having some cold symptoms and requests Flonase.  Patient with significant hx of "huffing', opioid abuse . Patient with better insight in to his substance abuse ,is motivated to get help.CSW will work on referral process.  Patient denies any SI/HI/AH/VH today.  Per staff ,patient has been attending group activities and is compliant on medications.   Principal Problem: Opioid use disorder, severe, dependence  Diagnosis:   Primary Psychiatric Diagnosis: Opioid use disorder,severe   Secondary Psychiatric Diagnosis: MDD, recurrent moderate  Other substance use disorder ,severe (Huffing) R/O Somatic symptoms disorder with attacks or seizures  Non Psychiatric Diagnosis:  See PMH Patient Active Problem List   Diagnosis Date Noted  . MDD (major depressive disorder), recurrent episode, moderate [F33.1] 10/15/2014  . Opioid use disorder, severe, dependence [F11.20] 10/14/2014  . Substance use disorder [F19.90] 10/14/2014  . 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 Hyperlipidemia [E78.5] 05/21/2008  . GERD [K21.9] 05/21/2008   Total Time spent with patient: 30 minutes   Past Medical History:  Past Medical History  Diagnosis Date  . Hypertension   . Anxiety and depression   . Osteoarthrosis, unspecified whether generalized or localized, unspecified site   . Irritable bowel syndrome   . GERD (gastroesophageal reflux disease)     h/o esophagitis  . Allergic rhinitis   . Insomnia   . Diverticulosis   . Alcohol abuse 2012  .  Polysubstance abuse   . Seizures   . Alcoholic pancreatitis   . Periumbilical hernia 12/13/12  . Fatty liver 10/08/11  . Internal hemorrhoids 03/13/11  . Hiatal hernia 03/13/11  . Asthmatic bronchitis   . Frequent headaches   . 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 Prediabetes 03/01/2014  . Depression   . Anxiety     Past Surgical History  Procedure Laterality Date  . Tibula surgery Right 2002   Family History:  Family History  Problem Relation Age of Onset  . Heart disease Mother   . Gallbladder disease Mother   . Nephrolithiasis Mother   . Arthritis Mother   . Hyperlipidemia Mother   . Stroke Mother   . Irritable bowel syndrome Mother   . Cancer Mother     vulva  . Colon cancer Neg Hx   . Hypertension Father     moth  . Heart disease Father   . Arthritis Father   . Irritable bowel syndrome Brother     x 2   Social History:  History  Alcohol Use No    Comment: no alcohol in three years; drank 35 years alcohol, quit 02/2012     History  Drug Use No    Comment: (denies current use) benzos- ; hx of THC, cocaine, uppers/downers    History   Social History  . Marital Status: Divorced    Spouse Name: N/A  . Number of Children: 1  . Years of Education: N/A   Occupational History  . unemployed-- apllying for disability    Social History Main Topics  . Smoking status: Former Smoker -- 1.50 packs/day  for 18 years    Quit date: 08/28/1991  . Smokeless tobacco: Current User    Types: Snuff     Comment: currently uses snuff.11/17/13  . Alcohol Use: No     Comment: no alcohol in three years; drank 35 years alcohol, quit 02/2012  . Drug Use: No     Comment: (denies current use) benzos- ; hx of THC, cocaine, uppers/downers  . Sexual Activity:    Partners: Female     Comment: no birth control   Other Topics Concern  . None   Social History Narrative   Lives w/ fiancee    Additional History:    Sleep: Fair  Appetite:  Fair    Musculoskeletal: Strength &  Muscle Tone: within normal limits Gait & Station: normal Patient leans: N/A   Psychiatric Specialty Exam: Physical Exam  Vitals reviewed. Skin: Rash noted.    ROS  Blood pressure 121/78, pulse 68, temperature 97.3 F (36.3 C), temperature source Oral, resp. rate 18, height 5' (1.524 m), weight 79.833 kg (176 lb), SpO2 100 %.Body mass index is 34.37 kg/(m^2).  General Appearance: Fairly Groomed  Patent attorney::  Fair  Speech:  Normal Rate  Volume:  Normal  Mood:  Anxious and Depressed improving  Affect:  Congruent  Thought Process:  Coherent  Orientation:  Full (Time, Place, and Person)  Thought Content:  WDL  Suicidal Thoughts:  No  Homicidal Thoughts:  No  Memory:  Immediate;   Fair Recent;   Fair Remote;   Fair  Judgement:  Impaired  Insight:  Shallow  Psychomotor Activity:  Normal  Concentration:  Fair  Recall:  [employer] of Knowledge:Fair  Language: Fair  Akathisia:  No  Handed:  Right  AIMS (if indicated):     Assets:  Communication Skills  ADL's:  Intact  Cognition: WNL  Sleep:  Number of Hours: 6.5     Current Medications: Current Facility-Administered Medications  Medication Dose Route Frequency Provider Last Rate Last Dose  . acetaminophen (TYLENOL) tablet 650 mg  650 mg Oral Q6H PRN Dayna Barker, NP      . albuterol (PROVENTIL HFA;VENTOLIN HFA) 108 (90 BASE) MCG/ACT inhaler 2 puff  2 puff Inhalation Q6H PRN Dayna Barker, NP   2 puff at 10/14/14 2200  . alum & mag hydroxide-simeth (MAALOX/MYLANTA) 200-200-20 MG/5ML suspension 30 mL  30 mL Oral Q4H PRN Dayna Barker, NP      . benztropine (COGENTIN) tablet 0.5 mg  0.5 mg Oral BH-qamhs Dayna Barker, NP   0.5 mg at 10/16/14 0753  . budesonide-formoterol (SYMBICORT) 160-4.5 MCG/ACT inhaler 2 puff  2 puff Inhalation BID PRN Jomarie Longs, MD      . carbamazepine (TEGRETOL XR) 12 hr tablet 400 mg  400 mg Oral BID Dayna Barker, NP   400 mg at 10/16/14 0753  .  carvedilol (COREG) tablet 12.5 mg  12.5 mg Oral BID WC Dayna Barker, NP   12.5 mg at 10/16/14 0753  . cloNIDine (CATAPRES) tablet 0.1 mg  0.1 mg Oral BH-qamhs Donatello Kleve, MD   0.1 mg at 10/16/14 0754   Followed by  . [START ON 10/18/2014] cloNIDine (CATAPRES) tablet 0.1 mg  0.1 mg Oral QAC breakfast Damaris Geers, MD      . clotrimazole (LOTRIMIN) 1 % cream   Topical BID Kenidy Crossland, MD      . dicyclomine (BENTYL) tablet 20 mg  20 mg Oral Q6H PRN Jomarie Longs, MD      .  fesoterodine (TOVIAZ) tablet 4 mg  4 mg Oral Daily Dayna Barker, NP   4 mg at 10/16/14 0753  . fluticasone (FLONASE) 50 MCG/ACT nasal spray 1 spray  1 spray Each Nare Daily Larosa Rhines, MD      . gabapentin (NEURONTIN) capsule 300 mg  300 mg Oral TID WC & HS Dayna Barker, NP   300 mg at 10/16/14 1206  . hydrochlorothiazide (HYDRODIURIL) tablet 25 mg  25 mg Oral Daily Dayna Barker, NP   25 mg at 10/16/14 0753  . hydrOXYzine (ATARAX/VISTARIL) tablet 25 mg  25 mg Oral Q6H PRN Dayna Barker, NP   25 mg at 10/15/14 0805  . ipratropium (ATROVENT) 0.03 % nasal spray 2 spray  2 spray Each Nare BID Dayna Barker, NP   2 spray at 10/16/14 0753  . loperamide (IMODIUM) capsule 2-4 mg  2-4 mg Oral PRN Jomarie Longs, MD      . magnesium hydroxide (MILK OF MAGNESIA) suspension 30 mL  30 mL Oral Daily PRN Dayna Barker, NP   30 mL at 10/15/14 1042  . methocarbamol (ROBAXIN) tablet 500 mg  500 mg Oral Q8H PRN Jomarie Longs, MD      . multivitamin with minerals tablet 1 tablet  1 tablet Oral Daily Dayna Barker, NP   1 tablet at 10/16/14 0754  . naproxen (NAPROSYN) tablet 500 mg  500 mg Oral BID PRN Jomarie Longs, MD   500 mg at 10/16/14 1208  . ondansetron (ZOFRAN-ODT) disintegrating tablet 4 mg  4 mg Oral Q6H PRN Jomarie Longs, MD      . pantoprazole (PROTONIX) EC tablet 40 mg  40 mg Oral Daily Dayna Barker, NP   40 mg at 10/16/14 0753  . risperiDONE  (RISPERDAL M-TABS) disintegrating tablet 1 mg  1 mg Oral BH-qamhs Ladarion Munyon, MD   1 mg at 10/16/14 0753  . sertraline (ZOLOFT) tablet 50 mg  50 mg Oral Daily Dayna Barker, NP   50 mg at 10/16/14 0755  . thiamine (B-1) injection 100 mg  100 mg Intramuscular Once Dayna Barker, NP   100 mg at 10/14/14 0515  . topiramate (TOPAMAX) tablet 25 mg  25 mg Oral Daily Dayna Barker, NP   25 mg at 10/16/14 0754  . traZODone (DESYREL) tablet 50 mg  50 mg Oral QHS PRN Dayna Barker, NP   50 mg at 10/15/14 2123    Lab Results:  No results found for this or any previous visit (from the past 48 hour(s)).  Physical Findings: AIMS: Facial and Oral Movements Muscles of Facial Expression: None, normal Lips and Perioral Area: None, normal Jaw: None, normal Tongue: None, normal,Extremity Movements Upper (arms, wrists, hands, fingers): None, normal Lower (legs, knees, ankles, toes): None, normal, Trunk Movements Neck, shoulders, hips: None, normal, Overall Severity Severity of abnormal movements (highest score from questions above): None, normal Incapacitation due to abnormal movements: None, normal Patient's awareness of abnormal movements (rate only patient's report): No Awareness, Dental Status Current problems with teeth and/or dentures?: No Does patient usually wear dentures?: No  CIWA:  CIWA-Ar Total: 0 COWS:  COWS Total Score: 0     Assessment; Patient with significant hx of dilaudid abuse as well as huffing ,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 Patient currently showing some progress on current medications.   Treatment Plan Summary: Daily contact with patient to assess and evaluate symptoms and progress in treatment, Medication management.  Will continue COWS/clonidine  protocol - patient reports abusing dilaudid on a regular basis - but UDS - negative. Will continue Zoloft 50 mg po daily.  Will continue Gabapentin to 300 mg po tid and HS,  patient reported this was started for pain. Will continue Tegretol as per recommendations from Neurology. Will continue Risperdal 1 mg po bid . Will continue Trazodone 50 mg po qhs for sleep.   CSW will work on referral to substance abuse program.   Medical Decision Making:  Established Problem, Stable/Improving (1), Review of Psycho-Social Stressors (1) and Review of Medication Regimen & Side Effects (2) Problem Points:  Established problem, stable/improving (1), Review of last therapy session (1) and Review of psycho-social stressors (1) Data Points:  Review of medication regiment & side effects (2)  Bond Grieshop MD 10/16/2014, 1:24 PM

## 2014-10-16 NOTE — Progress Notes (Signed)
Patient in day room at the beginning of the shift. He endorsed having a great day; "good food, good day, a little bit of anxiety". He reported that he takes Vistaril 50 mg at home but has not been receiving it here. Writer encouraged and supported patient. He denied SI/HI and denied Hallucinations. Q 15 minute check continues as ordered to maintain safety.

## 2014-10-16 NOTE — [facility] Group Notes (Signed)
[facility] Group Notes:  (Clinical Social Work)  10/16/2014  11:15-12:00PM  Summary of Progress/Problems:   The main focus of today's process group was to discuss patients' feelings about hospitalization, the stigma attached to mental health, and sources of motivation to stay well.  We then worked to identify a specific plan to avoid future hospitalizations when discharged from the hospital for this admission.  The patient expressed that he is feeling great about being in the hospital and getting the help he needs.  However, he did say it is embarrassing to talk about with the people in his life outside of the hospital setting where people understand mental health issues.  Type of Therapy:  Group Therapy - Process  Participation Level:  Active  Participation Quality:  Attentive  Affect:  Anxious and Blunted  Cognitive:  Alert and Appropriate  Insight:  Engaged  Engagement in Therapy:  Engaged  Modes of Intervention:  Exploration, Discussion  Ambrose MantleMareida Grossman-Orr, LCSW 10/16/2014, 12:41 PM

## 2014-10-17 DIAGNOSIS — F1990 Other psychoactive substance use, unspecified, uncomplicated: Secondary | ICD-10-CM

## 2014-10-17 DIAGNOSIS — F1120 Opioid dependence, uncomplicated: Secondary | ICD-10-CM

## 2014-10-17 DIAGNOSIS — F331 Major depressive disorder, recurrent, moderate: Principal | ICD-10-CM

## 2014-10-17 NOTE — Progress Notes (Signed)
Adult Psychoeducational Group Note  Date:  10/17/2014 Time:  8:57 PM  Group Topic/Focus:  Wrap-Up Group:   The focus of this group is to help patients review their daily goal of treatment and discuss progress on daily workbooks.  Participation Level:  Active  Participation Quality:  Appropriate  Affect:  Appropriate  Cognitive:  Appropriate  Insight: Appropriate  Engagement in Group:  Engaged  Modes of Intervention:  Discussion  Additional Comments: The patient expressed that he had a good day.The patient also said that he attend the music therapy group.  Octavio Mannshigpen, Noreen Mackintosh Lee 10/17/2014, 8:57 PM

## 2014-10-17 NOTE — [facility] Group Notes (Signed)
[facility] Group Notes:  (Clinical Social Work)  10/17/2014   11:15am-12:00pm  Summary of Progress/Problems:  The main focus of today's process group was to listen to a variety of genres of music and to identify that different types of music provoke different responses.  The patient then was able to identify personally what was soothing for them, as well as energizing.  Handouts were used to record feelings evoked, as well as how patient can personally use this knowledge in sleep habits, with depression, and with other symptoms.  The patient expressed understanding of concepts, as well as knowledge of how each type of music affected him/her and how this can be used at home as a wellness/recovery tool.  He was able and interested in identifying his feelings in response to each type of music.  Type of Therapy:  Music Therapy   Participation Level:  Active  Participation Quality:  Attentive and Sharing  Affect:  Blunted  Cognitive:  Oriented  Insight:  Engaged  Engagement in Therapy:  Engaged  Modes of Intervention:   Activity, Exploration  Ambrose MantleMareida Grossman-Orr, LCSW 10/17/2014, 12:30pm

## 2014-10-17 NOTE — Progress Notes (Signed)
Patient ID: Martin Singh, male   DOB: [birth year], 49 y.o.   MRN: [id number] Patient ID: Martin Singh, male   DOB: 06/03/1966, 49 y.o.   MRN: [id number] [facility] MD Progress Note  10/17/2014 1:20 PM MORDEKAI WILLE  MRN:  [id number] Subjective: Patient states "I am OK."   Objective: Patient seen and chart reviewed.Patient discussed with treatment team. Patient today appears to be improving.Denies any new symptoms today. Patient with significant hx of "huffing', opioid abuse . Patient with better insight in to his substance abuse ,is motivated to get help.CSW will work on referral process.  Patient denies any SI/HI/AH/VH today.  Per staff ,patient has been attending group activities and is compliant on medications.   Principal Problem: Opioid use disorder, severe, dependence  Diagnosis:   Primary Psychiatric Diagnosis: Opioid use disorder,severe   Secondary Psychiatric Diagnosis: MDD, recurrent moderate  Other substance use disorder ,severe (Huffing) R/O Somatic symptoms disorder with attacks or seizures  Non Psychiatric Diagnosis:  See PMH Patient Active Problem List   Diagnosis Date Noted  . MDD (major depressive disorder), recurrent episode, moderate [F33.1] 10/15/2014  . Opioid use disorder, severe, dependence [F11.20] 10/14/2014  . Substance use disorder [F19.90] 10/14/2014  . 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 Hyperlipidemia [E78.5] 05/21/2008  . GERD [K21.9] 05/21/2008   Total Time spent with patient: 30 minutes   Past Medical History:  Past Medical History  Diagnosis Date  . Hypertension   . Anxiety and depression   . Osteoarthrosis, unspecified whether generalized or localized, unspecified site   . Irritable bowel syndrome   . GERD (gastroesophageal reflux disease)     h/o esophagitis  . Allergic rhinitis   . Insomnia   . Diverticulosis   . Alcohol abuse 2012  . Polysubstance abuse   . Seizures   . Alcoholic pancreatitis   . Periumbilical hernia 12/13/12  .  Fatty liver 10/08/11  . Internal hemorrhoids 03/13/11  . Hiatal hernia 03/13/11  . Asthmatic bronchitis   . Frequent headaches   . 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 Prediabetes 03/01/2014  . Depression   . Anxiety     Past Surgical History  Procedure Laterality Date  . Tibula surgery Right 2002   Family History:  Family History  Problem Relation Age of Onset  . Heart disease Mother   . Gallbladder disease Mother   . Nephrolithiasis Mother   . Arthritis Mother   . Hyperlipidemia Mother   . Stroke Mother   . Irritable bowel syndrome Mother   . Cancer Mother     vulva  . Colon cancer Neg Hx   . Hypertension Father     moth  . Heart disease Father   . Arthritis Father   . Irritable bowel syndrome Brother     x 2   Social History:  History  Alcohol Use No    Comment: no alcohol in three years; drank 35 years alcohol, quit 02/2012     History  Drug Use No    Comment: (denies current use) benzos- ; hx of THC, cocaine, uppers/downers    History   Social History  . Marital Status: Divorced    Spouse Name: N/A  . Number of Children: 1  . Years of Education: N/A   Occupational History  . unemployed-- apllying for disability    Social History Main Topics  . Smoking status: Former Smoker -- 1.50 packs/day for 18 years    Quit date: 08/28/1991  . Smokeless tobacco: Current User  Types: Snuff     Comment: currently uses snuff.11/17/13  . Alcohol Use: No     Comment: no alcohol in three years; drank 35 years alcohol, quit 02/2012  . Drug Use: No     Comment: (denies current use) benzos- ; hx of THC, cocaine, uppers/downers  . Sexual Activity:    Partners: Female     Comment: no birth control   Other Topics Concern  . None   Social History Narrative   Lives w/ fiancee    Additional History:    Sleep: Fair  Appetite:  Fair    Musculoskeletal: Strength & Muscle Tone: within normal limits Gait & Station: normal Patient leans: N/A   Psychiatric Specialty  Exam: Physical Exam  Vitals reviewed. Skin: No rash noted.    Review of Systems  Psychiatric/Behavioral: Positive for substance abuse. The patient is nervous/anxious.     Blood pressure 121/78, pulse 68, temperature 97.3 F (36.3 C), temperature source Oral, resp. rate 18, height 5' (1.524 m), weight 79.833 kg (176 lb), SpO2 100 %.Body mass index is 34.37 kg/(m^2).  General Appearance: Fairly Groomed  Patent attorney::  Fair  Speech:  Normal Rate  Volume:  Normal  Mood:  Anxious and Depressed improving  Affect:  Congruent  Thought Process:  Coherent  Orientation:  Full (Time, Place, and Person)  Thought Content:  WDL  Suicidal Thoughts:  No  Homicidal Thoughts:  No  Memory:  Immediate;   Fair Recent;   Fair Remote;   Fair  Judgement:  Impaired  Insight:  Shallow  Psychomotor Activity:  Normal  Concentration:  Fair  Recall:  [employer] of Knowledge:Fair  Language: Fair  Akathisia:  No  Handed:  Right  AIMS (if indicated):     Assets:  Communication Skills  ADL's:  Intact  Cognition: WNL  Sleep:  Number of Hours: 5     Current Medications: Current Facility-Administered Medications  Medication Dose Route Frequency Provider Last Rate Last Dose  . acetaminophen (TYLENOL) tablet 650 mg  650 mg Oral Q6H PRN Dayna Barker, NP      . albuterol (PROVENTIL HFA;VENTOLIN HFA) 108 (90 BASE) MCG/ACT inhaler 2 puff  2 puff Inhalation Q6H PRN Dayna Barker, NP   2 puff at 10/14/14 2200  . alum & mag hydroxide-simeth (MAALOX/MYLANTA) 200-200-20 MG/5ML suspension 30 mL  30 mL Oral Q4H PRN Dayna Barker, NP   30 mL at 10/16/14 2206  . benztropine (COGENTIN) tablet 0.5 mg  0.5 mg Oral BH-qamhs Dayna Barker, NP   0.5 mg at 10/17/14 0818  . budesonide-formoterol (SYMBICORT) 160-4.5 MCG/ACT inhaler 2 puff  2 puff Inhalation BID PRN Jomarie Longs, MD   2 puff at 10/17/14 0825  . carbamazepine (TEGRETOL XR) 12 hr tablet 400 mg  400 mg Oral BID Dayna Barker, NP   400 mg at 10/17/14 0818  . carvedilol (COREG) tablet 12.5 mg  12.5 mg Oral BID WC Dayna Barker, NP   12.5 mg at 10/17/14 0818  . cloNIDine (CATAPRES) tablet 0.1 mg  0.1 mg Oral BH-qamhs Emon Miggins, MD   0.1 mg at 10/17/14 0819   Followed by  . [START ON 10/18/2014] cloNIDine (CATAPRES) tablet 0.1 mg  0.1 mg Oral QAC breakfast Kohan Azizi, MD      . clotrimazole (LOTRIMIN) 1 % cream   Topical BID Berenice Oehlert, MD      . dicyclomine (BENTYL) tablet 20 mg  20 mg Oral Q6H PRN Luverne Farone  Ashleynicole Mcclees, MD      . fesoterodine (TOVIAZ) tablet 4 mg  4 mg Oral Daily Dayna Barker, NP   4 mg at 10/17/14 0818  . fluticasone (FLONASE) 50 MCG/ACT nasal spray 1 spray  1 spray Each Nare Daily Jomarie Longs, MD   1 spray at 10/17/14 0817  . gabapentin (NEURONTIN) capsule 300 mg  300 mg Oral TID WC & HS Dayna Barker, NP   300 mg at 10/17/14 1214  . hydrochlorothiazide (HYDRODIURIL) tablet 25 mg  25 mg Oral Daily Dayna Barker, NP   25 mg at 10/17/14 0818  . ipratropium (ATROVENT) 0.03 % nasal spray 2 spray  2 spray Each Nare BID Dayna Barker, NP   2 spray at 10/17/14 0817  . loperamide (IMODIUM) capsule 2-4 mg  2-4 mg Oral PRN Jomarie Longs, MD      . magnesium hydroxide (MILK OF MAGNESIA) suspension 30 mL  30 mL Oral Daily PRN Dayna Barker, NP   30 mL at 10/15/14 1042  . methocarbamol (ROBAXIN) tablet 500 mg  500 mg Oral Q8H PRN Jomarie Longs, MD      . multivitamin with minerals tablet 1 tablet  1 tablet Oral Daily Dayna Barker, NP   1 tablet at 10/17/14 0819  . naproxen (NAPROSYN) tablet 500 mg  500 mg Oral BID PRN Jomarie Longs, MD   500 mg at 10/17/14 1257  . ondansetron (ZOFRAN-ODT) disintegrating tablet 4 mg  4 mg Oral Q6H PRN Jomarie Longs, MD      . pantoprazole (PROTONIX) EC tablet 40 mg  40 mg Oral Daily Dayna Barker, NP   40 mg at 10/17/14 0818  . risperiDONE (RISPERDAL M-TABS) disintegrating tablet 1 mg  1 mg Oral  BH-qamhs Linell Meldrum, MD   1 mg at 10/17/14 0819  . sertraline (ZOLOFT) tablet 50 mg  50 mg Oral Daily Dayna Barker, NP   50 mg at 10/17/14 0818  . thiamine (B-1) injection 100 mg  100 mg Intramuscular Once Dayna Barker, NP   100 mg at 10/14/14 0515  . topiramate (TOPAMAX) tablet 25 mg  25 mg Oral Daily Dayna Barker, NP   25 mg at 10/17/14 0818  . traZODone (DESYREL) tablet 50 mg  50 mg Oral QHS PRN Dayna Barker, NP   50 mg at 10/16/14 2130    Lab Results:  No results found for this or any previous visit (from the past 48 hour(s)).  Physical Findings: AIMS: Facial and Oral Movements Muscles of Facial Expression: None, normal Lips and Perioral Area: None, normal Jaw: None, normal Tongue: None, normal,Extremity Movements Upper (arms, wrists, hands, fingers): None, normal Lower (legs, knees, ankles, toes): None, normal, Trunk Movements Neck, shoulders, hips: None, normal, Overall Severity Severity of abnormal movements (highest score from questions above): None, normal Incapacitation due to abnormal movements: None, normal Patient's awareness of abnormal movements (rate only patient's report): No Awareness, Dental Status Current problems with teeth and/or dentures?: No Does patient usually wear dentures?: No  CIWA:  CIWA-Ar Total: 0 COWS:  COWS Total Score: 2     Assessment; Patient with significant hx of dilaudid abuse as well as huffing ,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 Patient currently showing some progress on current medications.   Treatment Plan Summary: Daily contact with patient to assess and evaluate symptoms and progress in treatment, Medication management.  Will continue COWS/clonidine protocol - patient reports abusing dilaudid on a regular basis - but UDS -  negative. Will continue Zoloft 50 mg po daily.  Will continue Gabapentin 300 mg po tid and HS, patient reported this was started for pain. Will continue  Tegretol as per recommendations from Neurology. Will continue Risperdal 1 mg po bid . Will continue Trazodone 50 mg po qhs for sleep.   CSW will work on referral to substance abuse program.   Medical Decision Making:  Established Problem, Stable/Improving (1), Review of Psycho-Social Stressors (1) and Review of Medication Regimen & Side Effects (2) Problem Points:  Established problem, stable/improving (1), Review of last therapy session (1) and Review of psycho-social stressors (1) Data Points:  Review of medication regiment & side effects (2)  Efraim Vanallen MD 10/17/2014, 1:20 PM

## 2014-10-17 NOTE — Progress Notes (Signed)
D: Pt presents anxious this morning. Pt denies suicidal thoughts and homicidal thoughts. Pt denies AVH. Pt rates depression 2/10. Hopeless 4/10.  Anxiety 3/10. Pt reports withdrawal symptoms of "shakes and tremors". Pt verbalized that [facility] plans [facility] once he is discharged for long-term tx. Pt reported that he often get headaches from taking his medications, he believes it's caused by his b/p meds. Pt verbalized that he should rise slowly when standing and drink fluids.  A: Medications administered as ordered per MD. Verbal support given. Pt encouraged to attend groups. 15 minute checks performed for safety.  R: Pt receptive to treatment. Pt stated goal is to manage his anxiety.

## 2014-10-18 ENCOUNTER — Telehealth: Payer: Self-pay | Admitting: Family

## 2014-10-18 MED ORDER — CLOTRIMAZOLE 1 % EX CREA: TOPICAL_CREAM | Freq: Two times a day (BID) | CUTANEOUS | Status: AC

## 2014-10-18 MED ORDER — RISPERIDONE 1 MG PO TBDP: 1.0000 mg | ORAL_TABLET | ORAL | Status: DC

## 2014-10-18 MED ORDER — ADULT MULTIVITAMIN W/MINERALS CH: 1.0000 | ORAL_TABLET | Freq: Every day | ORAL | Status: AC

## 2014-10-18 MED ORDER — FESOTERODINE FUMARATE ER 4 MG PO TB24: 4.0000 mg | ORAL_TABLET | Freq: Every day | ORAL | Status: AC

## 2014-10-18 MED ORDER — TOPIRAMATE 25 MG PO TABS: 25.0000 mg | ORAL_TABLET | Freq: Every day | ORAL | Status: AC

## 2014-10-18 MED ORDER — BUDESONIDE-FORMOTEROL FUMARATE 160-4.5 MCG/ACT IN AERO: 2.0000 | INHALATION_SPRAY | Freq: Two times a day (BID) | RESPIRATORY_TRACT | Status: AC

## 2014-10-18 MED ORDER — CARVEDILOL 12.5 MG PO TABS: 12.5000 mg | ORAL_TABLET | Freq: Two times a day (BID) | ORAL | Status: DC

## 2014-10-18 MED ORDER — SERTRALINE HCL 50 MG PO TABS: 50.0000 mg | ORAL_TABLET | Freq: Every day | ORAL | Status: DC

## 2014-10-18 MED ORDER — BENZTROPINE MESYLATE 0.5 MG PO TABS: 0.5000 mg | ORAL_TABLET | ORAL | Status: AC

## 2014-10-18 MED ORDER — HYDROCHLOROTHIAZIDE 25 MG PO TABS: 25.0000 mg | ORAL_TABLET | Freq: Every day | ORAL | Status: AC

## 2014-10-18 MED ORDER — IPRATROPIUM BROMIDE 0.03 % NA SOLN: NASAL | Status: AC

## 2014-10-18 MED ORDER — CARBAMAZEPINE ER 400 MG PO TB12: 400.0000 mg | ORAL_TABLET | Freq: Two times a day (BID) | ORAL | Status: AC

## 2014-10-18 MED ORDER — TRAZODONE HCL 50 MG PO TABS: 50.0000 mg | ORAL_TABLET | Freq: Every evening | ORAL | Status: DC | PRN

## 2014-10-18 MED ORDER — CLONIDINE HCL 0.1 MG PO TABS: 0.1000 mg | ORAL_TABLET | Freq: Every day | ORAL | Status: DC

## 2014-10-18 MED ORDER — ESOMEPRAZOLE MAGNESIUM 40 MG PO CPDR: 40.0000 mg | DELAYED_RELEASE_CAPSULE | Freq: Every day | ORAL | Status: AC

## 2014-10-18 MED ORDER — ALBUTEROL SULFATE HFA 108 (90 BASE) MCG/ACT IN AERS: 2.0000 | INHALATION_SPRAY | Freq: Four times a day (QID) | RESPIRATORY_TRACT | Status: AC | PRN

## 2014-10-18 MED ORDER — GABAPENTIN 300 MG PO CAPS: 300.0000 mg | ORAL_CAPSULE | Freq: Three times a day (TID) | ORAL | Status: DC

## 2014-10-18 NOTE — Plan of Care (Signed)
Problem: Alteration in mood Goal: LTG-Patient reports reduction in suicidal thoughts (Patient reports reduction in suicidal thoughts and is able to verbalize a safety plan for whenever patient is feeling suicidal)  Outcome: Progressing Pt denied SI this shift and verbally contracted for safety.    Problem: Alteration in mood & ability to function due to Goal: STG-Patient will comply with prescribed medication regimen (Patient will comply with prescribed medication regimen)  Outcome: Progressing Pt has been compliant with all medications this shift.

## 2014-10-18 NOTE — [facility] Group Notes (Signed)
[facility] LCSW Group Therapy  10/18/2014   1:15 PM   Type of Therapy:  Group Therapy  Participation Level:  Active  Participation Quality:  Attentive, Sharing and Supportive  Affect:  Depressed and Flat  Cognitive:  Alert and Oriented  Insight:  Developing/Improving and Engaged  Engagement in Therapy:  Developing/Improving and Engaged  Modes of Intervention:  Clarification, Confrontation, Discussion, Education, Exploration, Limit-setting, Orientation, Problem-solving, Rapport Building, Dance movement psychotherapisteality Testing, Socialization and Support  Summary of Progress/Problems: Pt identified obstacles faced currently and processed barriers involved in overcoming these obstacles. Pt identified steps necessary for overcoming these obstacles and explored motivation (internal and external) for facing these difficulties head on. Pt further identified one area of concern in their lives and chose a goal to focus on for today. Pt shared that he is discharging today and feels good about it. Pt states that he's enjoyed his stay here and the help he received from staff. Pt states that he is going [facility] and looks forward to getting the additional help he needs.  Pt states that he doesn't feel he has any obstacles right now. Pt participated in group discussion about the importance of staying on medication and following up with outpatient services.  Pt actively participated and was engaged in group discussion.   Martin IvanChelsea Horton, LCSW 10/18/2014  2:29 PM

## 2014-10-18 NOTE — Discharge Summary (Signed)
Physician Discharge Summary Note  Patient:  Martin Singh is an 49 y.o., male MRN:  [id number] DOB:  1965-12-09 Patient phone:  [phone] (home)  Patient address:   [address]  [place] [address] [address] [place] [address],  Total Time spent with patient: Greater than 30 minutes  Date of Admission:  10/14/2014  Date of Discharge: 10/18/14  Reason for Admission: Opioid detox  Principal Problem: Opioid use disorder, severe, dependence Discharge Diagnoses: Patient Active Problem List   Diagnosis Date Noted  . MDD (major depressive disorder), recurrent episode, moderate [F33.1] 10/15/2014  . Opioid use disorder, severe, dependence [F11.20] 10/14/2014  . Substance use disorder [F19.90] 10/14/2014  . 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 Hyperlipidemia [E78.5] 05/21/2008  . GERD [K21.9] 05/21/2008   Musculoskeletal: Strength & Muscle Tone: within normal limits Gait & Station: normal Patient leans: N/A  Psychiatric Specialty Exam: Physical Exam  Psychiatric: His speech is normal and behavior is normal. Judgment and thought content normal. His mood appears not anxious. His affect is not angry, not blunt, not labile and not inappropriate. Cognition and memory are normal. He does not exhibit a depressed mood.    Review of Systems  Constitutional: Negative.   HENT: Negative.   Eyes: Negative.   Respiratory: Negative.   Cardiovascular: Negative.   Gastrointestinal: Negative.   Genitourinary: Negative.   Musculoskeletal: Negative.   Skin: Negative.   Neurological: Negative.   Endo/Heme/Allergies: Negative.   Psychiatric/Behavioral: Positive for depression (Stable), hallucinations (Hx of) and substance abuse (Opioid dependence). Negative for suicidal ideas and memory loss. The patient has insomnia (Stable). The patient is not nervous/anxious.     Blood pressure 118/70, pulse 70, temperature 97.9 F (36.6 C), temperature source Oral, resp. rate 18, height 5' (1.524 m), weight 79.833 kg  (176 lb), SpO2 100 %.Body mass index is 34.37 kg/(m^2).  See Md's SRA   Past Medical History:  Past Medical History  Diagnosis Date  . Hypertension   . Anxiety and depression   . Osteoarthrosis, unspecified whether generalized or localized, unspecified site   . Irritable bowel syndrome   . GERD (gastroesophageal reflux disease)     h/o esophagitis  . Allergic rhinitis   . Insomnia   . Diverticulosis   . Alcohol abuse 2012  . Polysubstance abuse   . Seizures   . Alcoholic pancreatitis   . Periumbilical hernia 12/13/12  . Fatty liver 10/08/11  . Internal hemorrhoids 03/13/11  . Hiatal hernia 03/13/11  . Asthmatic bronchitis   . Frequent headaches   . 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 Prediabetes 03/01/2014  . Depression   . Anxiety     Past Surgical History  Procedure Laterality Date  . Tibula surgery Right 2002   Family History:  Family History  Problem Relation Age of Onset  . Heart disease Mother   . Gallbladder disease Mother   . Nephrolithiasis Mother   . Arthritis Mother   . Hyperlipidemia Mother   . Stroke Mother   . Irritable bowel syndrome Mother   . Cancer Mother     vulva  . Colon cancer Neg Hx   . Hypertension Father     moth  . Heart disease Father   . Arthritis Father   . Irritable bowel syndrome Brother     x 2   Social History:  History  Alcohol Use No    Comment: no alcohol in three years; drank 35 years alcohol, quit 02/2012     History  Drug Use No  Comment: (denies current use) benzos- ; hx of THC, cocaine, uppers/downers    History   Social History  . Marital Status: Divorced    Spouse Name: N/A  . Number of Children: 1  . Years of Education: N/A   Occupational History  . unemployed-- apllying for disability    Social History Main Topics  . Smoking status: Former Smoker -- 1.50 packs/day for 18 years    Quit date: 08/28/1991  . Smokeless tobacco: Current User    Types: Snuff     Comment: currently uses snuff.11/17/13  .  Alcohol Use: No     Comment: no alcohol in three years; drank 35 years alcohol, quit 02/2012  . Drug Use: No     Comment: (denies current use) benzos- ; hx of THC, cocaine, uppers/downers  . Sexual Activity:    Partners: Female     Comment: no birth control   Other Topics Concern  . None   Social History Narrative   Lives w/ fiancee    Risk to Self: Is patient at risk for suicide?: Yes Risk to Others:   Prior Inpatient Therapy:   Prior Outpatient Therapy:    Level of Care:  OP  Hospital Course:  Martin Singh is a 49 y.o. CM who voluntarily presented to [facility] with SI/SA and depression. Pt reported SI thoughts x 1 month, since his discharge from [facility] last month. Patient seen this AM. Patient reported that he could not go to [facility] as planned last month , because as soon as he got discharged his fiance fell and he had to take care of her. When he eventually decided to go to [facility] , he realized that he did not have 2 weeks supply of medications to get in there since [facility] did not send him medications on time. Patient also reports that he ran out of his 'voice" medication and started having AH again.  While a patient in this hospital this time around, Martin Singh received substance detoxification treatment. This is achieved using Librium detox protocols. He also received mood stabilization treatments. He was medicated & discharged on; Benztropine 0.5 mg for prevention of EPS, Tegretol XR 400 mg for mood stabilization, Gabapentin 300 mg for agitation/substance withdrawal syndrome, Risperdal disintegrating tablet 1 mg for mood control, Sertraline 50 mg for depression and Trazodone 50 mg for insomnia. He was also resumed on his pertinent home medications for his other pre-existing medical issues that he presented. He tolerated his treatment regimen without any adverse effects reported.  Besides the detoxification/mood stabilization treatments, Martin Singh was also enrolled in the group counseling sessions  being offered & held on this unit. He learned coping skills. Martin Singh has completed detox treatment & his mood is stable. This is evidenced by his reports of improved mood & absence of substance withdrawal symptoms. He is currently being discharged to continue substance abuse treatment at the [facility] starting tomorrow. And for medication management & routine counseling sessions, he will be receiving these services at the [facility] here in [place], [place]. Martin Singh is provided with all the necessary information required to make these appointments in no without problems.  Upon discharge, he adamantly denies any SIHI, AVH, delusional thoughts, paranoia and or substance withdrawal symptoms. He received from the [facility] pharmacy, a 14 days worth, supply samples of his [facility] discharge medications. He left [facility] with all personal belongings in no distress. Transportation per fiance.  Consults:  psychiatry  Significant Diagnostic Studies:  labs: CBC with diff,  CMP, UDS, toxicology tests, U/A, results reviewed, no changes  Discharge Vitals:   Blood pressure 118/70, pulse 70, temperature 97.9 F (36.6 C), temperature source Oral, resp. rate 18, height 5' (1.524 m), weight 79.833 kg (176 lb), SpO2 100 %. Body mass index is 34.37 kg/(m^2). Lab Results:   No results found for this or any previous visit (from the past 72 hour(s)).  Physical Findings: AIMS: Facial and Oral Movements Muscles of Facial Expression: None, normal Lips and Perioral Area: None, normal Jaw: None, normal Tongue: None, normal,Extremity Movements Upper (arms, wrists, hands, fingers): None, normal Lower (legs, knees, ankles, toes): None, normal, Trunk Movements Neck, shoulders, hips: None, normal, Overall Severity Severity of abnormal movements (highest score from questions above): None, normal Incapacitation due to abnormal movements: None, normal Patient's awareness of abnormal movements (rate only patient's report):  No Awareness, Dental Status Current problems with teeth and/or dentures?: No Does patient usually wear dentures?: No  CIWA:  CIWA-Ar Total: 0 COWS:  COWS Total Score: 1  See Psychiatric Specialty Exam and Suicide Risk Assessment completed by Attending Physician prior to discharge.  Discharge destination:  Home  Is patient on multiple antipsychotic therapies at discharge:  No   Has Patient had three or more failed trials of antipsychotic monotherapy by history:  No  Recommended Plan for Multiple Antipsychotic Therapies: NA    Medication List    STOP taking these medications        aspirin 81 MG chewable tablet     atenolol 100 MG tablet  Commonly known as:  TENORMIN     diphenhydrAMINE 25 MG tablet  Commonly known as:  BENADRYL     ibuprofen 200 MG tablet  Commonly known as:  ADVIL,MOTRIN     IRON COMPLEX PO     nystatin 100000 UNIT/GM Powd     omalizumab 150 MG injection  Commonly known as:  XOLAIR     omeprazole 40 MG capsule  Commonly known as:  PRILOSEC     predniSONE 5 MG tablet  Commonly known as:  DELTASONE     tetrahydrozoline 0.05 % ophthalmic solution      TAKE these medications      Indication   albuterol 108 (90 BASE) MCG/ACT inhaler  Commonly known as:  PROVENTIL HFA;VENTOLIN HFA  Inhale 2 puffs into the lungs every 6 (six) hours as needed for wheezing or shortness of breath.   Indication:  Asthma     benztropine 0.5 MG tablet  Commonly known as:  COGENTIN  Take 1 tablet (0.5 mg total) by mouth 2 (two) times daily in the am and at bedtime.. For prevention of drug induced tremors   Indication:  Extrapyramidal Reaction caused by Medications     budesonide-formoterol 160-4.5 MCG/ACT inhaler  Commonly known as:  SYMBICORT  Inhale 2 puffs into the lungs 2 (two) times daily. For asthma   Indication:  Asthma     carbamazepine 400 MG 12 hr tablet  Commonly known as:  TEGRETOL XR  Take 1 tablet (400 mg total) by mouth 2 (two) times daily. For mood  stabilization   Indication:  Mood stability     carvedilol 12.5 MG tablet  Commonly known as:  COREG  Take 1 tablet (12.5 mg total) by mouth 2 (two) times daily with a meal. For high blood pressure   Indication:  HTN     cloNIDine 0.1 MG tablet  Commonly known as:  CATAPRES  Take 1 tablet (0.1 mg total) by mouth daily before  breakfast. For high blood pressure   Indication:  HTN     clotrimazole 1 % cream  Commonly known as:  LOTRIMIN  Apply topically 2 (two) times daily. For fungal rash   Indication:  Fungal rash     esomeprazole 40 MG capsule  Commonly known as:  NEXIUM  Take 1 capsule (40 mg total) by mouth daily at 12 noon. For acid reflux   Indication:  Gastroesophageal Reflux Disease with Current Symptoms     fesoterodine 4 MG Tb24 tablet  Commonly known as:  TOVIAZ  Take 1 tablet (4 mg total) by mouth daily. For urinary problems   Indication:  Overactive Bladder     gabapentin 300 MG capsule  Commonly known as:  NEURONTIN  Take 1 capsule (300 mg total) by mouth 4 (four) times daily -  with meals and at bedtime. For agitation   Indication:  Agitation     hydrochlorothiazide 25 MG tablet  Commonly known as:  HYDRODIURIL  Take 1 tablet (25 mg total) by mouth daily. For high blood pressure   Indication:  High Blood Pressure     ipratropium 0.03 % nasal spray  Commonly known as:  ATROVENT  Two sprays each nostril 2-3 times daily prn: For runny nose   Indication:  Runny Nose     multivitamin with minerals Tabs tablet  Take 1 tablet by mouth daily. For low vitamin   Indication:  Low vitamin     risperiDONE 1 MG disintegrating tablet  Commonly known as:  RISPERDAL M-TABS  Take 1 tablet (1 mg total) by mouth 2 (two) times daily in the am and at bedtime.. For mood control   Indication:  Mood control     sertraline 50 MG tablet  Commonly known as:  ZOLOFT  Take 1 tablet (50 mg total) by mouth daily. For depression   Indication:  Major Depressive Disorder      topiramate 25 MG tablet  Commonly known as:  TOPAMAX  Take 1 tablet (25 mg total) by mouth daily. Migraine headaches   Indication:  Migraine Headache     traZODone 50 MG tablet  Commonly known as:  DESYREL  Take 1 tablet (50 mg total) by mouth at bedtime as needed for sleep.   Indication:  Trouble Sleeping           Follow-up Information    Follow up with [facility].   Why:  8AM on Tuesday, Feb. 23 for a screening for admission appointment   Contact information:   [address]  [employer]  [phone]      Follow up with [facility].   Why:  Walk in between 8am-12pm for follow-up/medication management/to set therapy appt after completing inpatient treatment at [facility].    Contact information:   [address], [place] [address] Phone: [phone] Fax: [phone]     Follow-up recommendations: Activity:  As tolerated Diet: As recommended by your primary care doctor. Keep all scheduled follow-up appointments as recommended.   Comments: Take all your medications as prescribed by your mental healthcare provider. Report any adverse effects and or reactions from your medicines to your outpatient provider promptly. Patient is instructed and cautioned to not engage in alcohol and or illegal drug use while on prescription medicines. In the event of worsening symptoms, patient is instructed to call the crisis hotline, 911 and or go to the nearest ED for appropriate evaluation and treatment of symptoms. Follow-up  with your primary care provider for your other medical issues, concerns and or health care needs.   Total Discharge Time: Greater than 30 minutes  Signed: Sanjuana Kava, PMHNP-BC 10/18/2014, 12:44 PM   Patient seen, Suicide Assessment Completed.  Disposition Plan Reviewed

## 2014-10-18 NOTE — Progress Notes (Signed)
  [facility] :  Will you be returning to the same living situation after discharge:  Yes,  [facility] until [facility]. Pt has screening Tues morning at 8am At discharge, do you have transportation home?: Yes,  fiance Do you have the ability to pay for your medications: Yes,  mental health  Release of information consent forms completed and submitted to medical records by CSW.  Patient to Follow up at: Follow-up Information    Follow [facility] with [facility].   Why:  8AM on Tuesday, Feb. 23 for a screening for admission appointment   Contact information:   [address]  [place]  [phone]      [facility] with [facility].   Why:  Walk in between 8am-12pm for follow-up/medication management/to set therapy appt after [facility] inpatient [facility].    Contact information:   [address]  [place], [place] [address] Phone: [phone] Fax: [phone]      Patient denies SI/HI: Yes,  during self report.     Safety Planning and Suicide Prevention discussed: Yes,  SPE completed with pt's fiance. SPI pamphlet provided to pt and he was encouraged to share information with support network, ask questions, and talk about any concerns relating to SPE.  Have you used any form of tobacco in the last 30 days? (Cigarettes, Smokeless Tobacco, Cigars, and/or Pipes): Yes  Has patient been referred to the Quitline?: Patient refused referral  Smart, Lebron QuamHeather LCSWA  10/18/2014, 10:46 AM

## 2014-10-18 NOTE — Progress Notes (Signed)
Discharge Note: Discharge instructions/prescriptions/medication samples and letter for court given to patient. Patient verbalized understanding of discharge instructions and prescriptions. Returned belongings to patient. Denies SI/HI/AVH. Patient d/c without incident to the lobby and transported home with his girlfriend.

## 2014-10-18 NOTE — Progress Notes (Signed)
D: Pt has appropriate affect and pleasant mood.  Pt reports "it has been a real good day."  Pt reports his goal today was "working on anxieties and meditation.  Pt denies SI/HI, denies hallucinations.  He reports withdrawal symptoms of anxiety and tremor.  He reports his withdrawal symptoms have improved.  Pt interacts appropriately with staff and peers and was visible in the milieu earlier this evening.   A: Medication administered per order.  PRN medications administered for shortness of breath, pain, sleep, see flowsheet.  Safety maintained.  Actively listened to pt.  Encouraged and supported pt.  R: Pt verbally contracts for safety.  He was compliant with medications.  Pt reports he will notify staff of needs and concerns.  Will continue to monitor and assess for safety.

## 2014-10-18 NOTE — Telephone Encounter (Signed)
Martin Singh is stating that       carvedilol (COREG) 12.5 MG tablet [[id number]][[id number]] was never received from [facility]. She also states that the cloNIDine (CATAPRES) 0.1 MG tablet [[id number]][[id number]] should not have gone to [facility]. Should go to [facility].  She states that you had mailed in originals and faxed copies. She is requesting a call.

## 2014-10-18 NOTE — BHH Suicide Risk Assessment (Signed)
[facility] Discharge Suicide Risk Assessment   Demographic Factors:  49 year old man, currently unemployed, lives with fiance   Total Time spent with patient: 30 minutes  Musculoskeletal: Strength & Muscle Tone: within normal limits Gait & Station: normal Patient leans: N/A  Psychiatric Specialty Exam: Physical Exam  ROS  Blood pressure 118/70, pulse 70, temperature 97.9 F (36.6 C), temperature source Oral, resp. rate 18, height 5' (1.524 m), weight 176 lb (79.833 kg), SpO2 100 %.Body mass index is 34.37 kg/(m^2).  General Appearance: Fairly Groomed  Patent attorney::  Good  Speech:  Normal Rate  Volume:  Increased  Mood:  improved, at this time denies depression and presents euthymic  Affect:  Appropriate and reactive   Thought Process:  Goal Directed and Linear  Orientation:  Full (Time, Place, and Person)  Thought Content:  denies hallucinations, no delusions, does not appear internally preoccupied   Suicidal Thoughts:  No  Homicidal Thoughts:  No  Memory:  recent and remote grossly intact   Judgement:  Other:  improved   Insight:  Present  Psychomotor Activity:  Normal  Concentration:  Good  Recall:  Good  Fund of Knowledge:Good  Language: Good  Akathisia:  Negative  Handed:  Right  AIMS (if indicated):     Assets:  Desire for Improvement Resilience  Sleep:  Number of Hours: 6  Cognition: WNL  ADL's: improved    Have you used any form of tobacco in the last 30 days? (Cigarettes, Smokeless Tobacco, Cigars, and/or Pipes): Yes  Has this patient used any form of tobacco in the last 30 days? (Cigarettes, Smokeless Tobacco, Cigars, and/or Pipes) Yes, A prescription for an FDA-approved tobacco cessation medication was offered at discharge and the patient refused  Mental Status Per Nursing Assessment::   On Admission:     Current Mental Status by Physician: At this time patient is fully alert, attentive, well related, calm, denies any current symptoms of withdrawal and does not  appear to be uncomfortable or in any acute distress. Mood is "OK", and presents euthymic, with full range of affect, no thought disorder, no SI or HI, future oriented , looking forward to discharge   Loss Factors: Unemployment, chronic pain issues, mother passed away last year   Historical Factors: history of opiate dependence, history of alcohol abuse, sober x 3 (+) years , history of prior rehabilitation program treatments  Risk Reduction Factors:   Sense of responsibility to family, Living with another person, especially a relative, Positive social support and Positive coping skills or problem solving skills  Continued Clinical Symptoms:  As noted, at this time reports feeling better and denies depression. Mood seems euthymic, affect is full in range, no thought disorder, no SI or HI, no psychotic symptoms. No current withdrawal symptoms noted or reported . Vitals are stable .  Cognitive Features That Contribute To Risk:  No gross cognitive deficits noted upon discharge. Is alert , attentive, and oriented x 3   Suicide Risk:  Mild:  Suicidal ideation of limited frequency, intensity, duration, and specificity.  There are no identifiable plans, no associated intent, mild dysphoria and related symptoms, good self-control (both objective and subjective assessment), few other risk factors, and identifiable protective factors, including available and accessible social support.  Principal Problem: Opioid use disorder, severe, dependence Discharge Diagnoses:  Patient Active Problem List   Diagnosis Date Noted  . MDD (major depressive disorder), recurrent episode, moderate [F33.1] 10/15/2014  . Opioid use disorder, severe, dependence [F11.20] 10/14/2014  .  Substance use disorder [F19.90] 10/14/2014  . 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 Hyperlipidemia [E78.5] 05/21/2008  . GERD [K21.9] 05/21/2008    Follow-up Information    Follow up with [facility].   Why:  8AM on Tuesday, Feb.  23 for a screening for admission appointment   Contact information:   [address]  [place]  [phone]      Follow up with [facility].   Why:  Walk in between 8am-12pm for follow-up/medication management/to set therapy appt after completing inpatient [facility].    Contact information:   [address] [address]  [place], [place] [address] Phone: [phone] Fax: [phone]      Plan Of Care/Follow-up recommendations:  Activity:  As tolerated Diet:  Heart healthy, low cholesterol Tests:  NA Other:  See below  Is patient on multiple antipsychotic therapies at discharge:  No   Has Patient had three or more failed trials of antipsychotic monotherapy by history:  No  Recommended Plan for Multiple Antipsychotic Therapies: NA  Patient is leaving unit in good spirits. States fiance will pick him up this afternoon and also transport [facility] tomorrow in AM. PCP is at [facility] for medical issues as needed. Patient to follow up with Dr. Everlena CooperJaffe, his established outpatient neurologist.   Nehemiah MassedOBOS, Martin 10/18/2014, 2:02 PM

## 2014-10-19 ENCOUNTER — Other Ambulatory Visit: Payer: Self-pay

## 2014-10-19 MED ORDER — GABAPENTIN 300 MG PO CAPS: 300.0000 mg | ORAL_CAPSULE | Freq: Three times a day (TID) | ORAL | Status: AC

## 2014-10-19 MED ORDER — CARVEDILOL 12.5 MG PO TABS: 12.5000 mg | ORAL_TABLET | Freq: Two times a day (BID) | ORAL | Status: AC

## 2014-10-19 MED ORDER — RISPERIDONE 1 MG PO TBDP: 1.0000 mg | ORAL_TABLET | ORAL | Status: DC

## 2014-10-19 MED ORDER — TRAZODONE HCL 50 MG PO TABS: 50.0000 mg | ORAL_TABLET | Freq: Every evening | ORAL | Status: AC | PRN

## 2014-10-19 MED ORDER — SERTRALINE HCL 50 MG PO TABS: 50.0000 mg | ORAL_TABLET | Freq: Every day | ORAL | Status: AC

## 2014-10-19 MED ORDER — CLONIDINE HCL 0.1 MG PO TABS: 0.1000 mg | ORAL_TABLET | Freq: Every day | ORAL | Status: AC

## 2014-10-19 NOTE — Telephone Encounter (Signed)
Spoke with pts care giver. She stated that the pts medications have yet to be received from [facility]. She knows that I tried faxing as well as mailing the scripts to [facility]. She asked that I re fax pts medications and then call [facility] to make sure they got the fax. I told the pt that I would try my best to get things straightened out. Pt has been waiting for meds for 3 weeks. Also pt would like a medical clearance letter to go to [facility]. Please advise.

## 2014-10-19 NOTE — Telephone Encounter (Signed)
Tried returning pts call and left message for her to call back.

## 2014-10-20 ENCOUNTER — Telehealth: Payer: Self-pay | Admitting: Family

## 2014-10-20 NOTE — Telephone Encounter (Signed)
Martin Singh patient's (care giver) wants to know if the Medical Clearance letter for the patient to be admitted into [facility] is ready.  It can be faxed to [phone]. She would like a return call about this request.

## 2014-10-20 NOTE — Progress Notes (Signed)
Patient Discharge Instructions:  After Visit Summary (AVS):   Faxed to:  10/20/14 Discharge Summary Note:   Faxed to:  10/20/14 Psychiatric Admission Assessment Note:   Faxed to:  10/20/14 Suicide Risk Assessment - Discharge Assessment:   Faxed to:  10/20/14 Faxed/Sent to the Next Level Care provider:  10/20/14 Faxed to [facility] @ [phone] [facility] @ [phone]  Jerelene ReddenSheena E Gaston, 10/20/2014, 3:52 PM

## 2014-10-20 NOTE — Telephone Encounter (Signed)
Need a medical clearance letter. Please advise

## 2014-10-21 ENCOUNTER — Encounter: Payer: Self-pay | Admitting: Family

## 2014-10-21 NOTE — Telephone Encounter (Signed)
Left vm inform pt that letter is faxed, she can pick up the original.

## 2014-10-21 NOTE — Telephone Encounter (Signed)
Martin Singh came in to see if the letter is done and fax to [facility]. Please get this done today because they don't do intake on Sat, Sun and Monday. Please call if this is done

## 2014-10-21 NOTE — Telephone Encounter (Signed)
Letter written. Please fax to [facility] at [phone] and inform patient that it is ready to be picked up.

## 2014-10-22 ENCOUNTER — Telehealth: Payer: Self-pay | Admitting: Family

## 2014-10-22 MED ORDER — RISPERIDONE 1 MG PO TBDP: 1.0000 mg | ORAL_TABLET | ORAL | Status: AC

## 2014-10-22 NOTE — Telephone Encounter (Signed)
[name] who advised that they received taylors fax for 5 of the RX sent on 10/18/2014, but not the   risperiDONE (RISPERDAL M-TABS) 1 MG disintegrating tablet [[id number]][[id number]]      She is requesting a stat [facility].

## 2014-10-22 NOTE — Telephone Encounter (Signed)
Print[facility]signed [facility].

## 2014-10-23 ENCOUNTER — Inpatient Hospital Stay (HOSPITAL_COMMUNITY): Payer: Medicaid Other

## 2014-10-23 ENCOUNTER — Inpatient Hospital Stay (HOSPITAL_COMMUNITY)
Admission: EM | Admit: 2014-10-23 | Discharge: 2014-10-26 | DRG: 917 | Disposition: E | Payer: Medicaid Other | Attending: Pulmonary Disease | Admitting: Pulmonary Disease

## 2014-10-23 ENCOUNTER — Encounter (HOSPITAL_COMMUNITY): Payer: Self-pay | Admitting: *Deleted

## 2014-10-23 ENCOUNTER — Emergency Department (HOSPITAL_COMMUNITY): Payer: Medicaid Other

## 2014-10-23 ENCOUNTER — Other Ambulatory Visit (HOSPITAL_COMMUNITY): Payer: Self-pay

## 2014-10-23 DIAGNOSIS — I469 Cardiac arrest, cause unspecified: Secondary | ICD-10-CM | POA: Diagnosis present

## 2014-10-23 DIAGNOSIS — R739 Hyperglycemia, unspecified: Secondary | ICD-10-CM | POA: Diagnosis present

## 2014-10-23 DIAGNOSIS — Z66 Do not resuscitate: Secondary | ICD-10-CM | POA: Diagnosis present

## 2014-10-23 DIAGNOSIS — N179 Acute kidney failure, unspecified: Secondary | ICD-10-CM | POA: Diagnosis present

## 2014-10-23 DIAGNOSIS — R569 Unspecified convulsions: Secondary | ICD-10-CM | POA: Diagnosis present

## 2014-10-23 DIAGNOSIS — T65892A Toxic effect of other specified substances, intentional self-harm, initial encounter: Secondary | ICD-10-CM | POA: Diagnosis present

## 2014-10-23 DIAGNOSIS — K219 Gastro-esophageal reflux disease without esophagitis: Secondary | ICD-10-CM | POA: Diagnosis present

## 2014-10-23 DIAGNOSIS — G9382 Brain death: Secondary | ICD-10-CM | POA: Diagnosis not present

## 2014-10-23 DIAGNOSIS — G931 Anoxic brain damage, not elsewhere classified: Secondary | ICD-10-CM | POA: Diagnosis present

## 2014-10-23 DIAGNOSIS — K7200 Acute and subacute hepatic failure without coma: Secondary | ICD-10-CM | POA: Diagnosis present

## 2014-10-23 DIAGNOSIS — M1990 Unspecified osteoarthritis, unspecified site: Secondary | ICD-10-CM | POA: Diagnosis present

## 2014-10-23 DIAGNOSIS — T1491XA Suicide attempt, initial encounter: Secondary | ICD-10-CM

## 2014-10-23 DIAGNOSIS — R570 Cardiogenic shock: Secondary | ICD-10-CM | POA: Diagnosis present

## 2014-10-23 DIAGNOSIS — R40243 Glasgow coma scale score 3-8: Secondary | ICD-10-CM

## 2014-10-23 DIAGNOSIS — F558 Abuse of other non-psychoactive substances: Secondary | ICD-10-CM | POA: Diagnosis present

## 2014-10-23 DIAGNOSIS — K760 Fatty (change of) liver, not elsewhere classified: Secondary | ICD-10-CM | POA: Diagnosis present

## 2014-10-23 DIAGNOSIS — F419 Anxiety disorder, unspecified: Secondary | ICD-10-CM | POA: Diagnosis present

## 2014-10-23 DIAGNOSIS — E669 Obesity, unspecified: Secondary | ICD-10-CM | POA: Diagnosis present

## 2014-10-23 DIAGNOSIS — I10 Essential (primary) hypertension: Secondary | ICD-10-CM | POA: Diagnosis present

## 2014-10-23 DIAGNOSIS — J690 Pneumonitis due to inhalation of food and vomit: Secondary | ICD-10-CM | POA: Diagnosis not present

## 2014-10-23 DIAGNOSIS — E876 Hypokalemia: Secondary | ICD-10-CM | POA: Diagnosis present

## 2014-10-23 DIAGNOSIS — J9601 Acute respiratory failure with hypoxia: Secondary | ICD-10-CM

## 2014-10-23 DIAGNOSIS — F329 Major depressive disorder, single episode, unspecified: Secondary | ICD-10-CM | POA: Diagnosis present

## 2014-10-23 DIAGNOSIS — Z515 Encounter for palliative care: Secondary | ICD-10-CM | POA: Diagnosis not present

## 2014-10-23 DIAGNOSIS — J9600 Acute respiratory failure, unspecified whether with hypoxia or hypercapnia: Secondary | ICD-10-CM

## 2014-10-23 DIAGNOSIS — R4020 Unspecified coma: Secondary | ICD-10-CM | POA: Diagnosis present

## 2014-10-23 DIAGNOSIS — E872 Acidosis: Secondary | ICD-10-CM | POA: Diagnosis present

## 2014-10-23 DIAGNOSIS — R0902 Hypoxemia: Secondary | ICD-10-CM

## 2014-10-23 DIAGNOSIS — J45909 Unspecified asthma, uncomplicated: Secondary | ICD-10-CM | POA: Diagnosis present

## 2014-10-23 DIAGNOSIS — G936 Cerebral edema: Secondary | ICD-10-CM | POA: Diagnosis not present

## 2014-10-23 DIAGNOSIS — F1810 Inhalant abuse, uncomplicated: Secondary | ICD-10-CM

## 2014-10-23 DIAGNOSIS — E871 Hypo-osmolality and hyponatremia: Secondary | ICD-10-CM | POA: Diagnosis present

## 2014-10-23 DIAGNOSIS — F17220 Nicotine dependence, chewing tobacco, uncomplicated: Secondary | ICD-10-CM | POA: Diagnosis present

## 2014-10-23 LAB — CBC
HCT: 46.8 % (ref 39.0–52.0)
HCT: 42.9 % (ref 39.0–52.0)
HEMOGLOBIN: 13.9 g/dL (ref 13.0–17.0)
Hemoglobin: 15.2 g/dL (ref 13.0–17.0)
MCH: 28.1 pg (ref 26.0–34.0)
MCH: 28.3 pg (ref 26.0–34.0)
MCHC: 32.5 g/dL (ref 30.0–36.0)
MCHC: 32.4 g/dL (ref 30.0–36.0)
MCV: 86.5 fL (ref 78.0–100.0)
MCV: 87.2 fL (ref 78.0–100.0)
PLATELETS: 397 10*3/uL (ref 150–400)
Platelets: 447 10*3/uL — ABNORMAL HIGH (ref 150–400)
RBC: 5.41 MIL/uL (ref 4.22–5.81)
RBC: 4.92 MIL/uL (ref 4.22–5.81)
RDW: 13.8 % (ref 11.5–15.5)
RDW: 13.8 % (ref 11.5–15.5)
WBC: 10.1 10*3/uL (ref 4.0–10.5)
WBC: 12 10*3/uL — AB (ref 4.0–10.5)

## 2014-10-23 LAB — TROPONIN I
Troponin I: <0.03 ng/mL (ref <0.031)
Troponin I: 0.7 ng/mL (ref <0.031)

## 2014-10-23 LAB — I-STAT ARTERIAL BLOOD GAS, ED
Acid-base deficit: 5 mmol/L — ABNORMAL HIGH (ref 0.0–2.0)
Bicarbonate: 22.8 mEq/L (ref 20.0–24.0)
O2 Saturation: 100 %
PCO2 ART: 54.8 mmHg — AB (ref 35.0–45.0)
PH ART: 7.228 — AB (ref 7.350–7.450)
Patient temperature: 98.6
TCO2: 24 mmol/L (ref 0–100)
pO2, Arterial: 456 mmHg — ABNORMAL HIGH (ref 80.0–100.0)

## 2014-10-23 LAB — COMPREHENSIVE METABOLIC PANEL
ALK PHOS: 86 U/L (ref 39–117)
ALT: 95 U/L — AB (ref 0–53)
AST: 93 U/L — ABNORMAL HIGH (ref 0–37)
Albumin: 3.9 g/dL (ref 3.5–5.2)
Anion gap: 14 (ref 5–15)
BILIRUBIN TOTAL: 0.4 mg/dL (ref 0.3–1.2)
BUN: 10 mg/dL (ref 6–23)
CALCIUM: 8.7 mg/dL (ref 8.4–10.5)
CO2: 21 mmol/L (ref 19–32)
Chloride: 97 mmol/L (ref 96–112)
Creatinine, Ser: 1.51 mg/dL — ABNORMAL HIGH (ref 0.50–1.35)
GFR calc Af Amer: 61 mL/min — ABNORMAL LOW (ref >90)
GFR calc non Af Amer: 53 mL/min — ABNORMAL LOW (ref >90)
GLUCOSE: 255 mg/dL — AB (ref 70–99)
Potassium: 4.9 mmol/L (ref 3.5–5.1)
SODIUM: 132 mmol/L — AB (ref 135–145)
Total Protein: 6.6 g/dL (ref 6.0–8.3)

## 2014-10-23 LAB — POCT I-STAT 3, ART BLOOD GAS (G3+)
Acid-base deficit: 11 mmol/L — ABNORMAL HIGH (ref 0.0–2.0)
Acid-base deficit: 6 mmol/L — ABNORMAL HIGH (ref 0.0–2.0)
BICARBONATE: 22.2 meq/L (ref 20.0–24.0)
Bicarbonate: 19.7 mEq/L — ABNORMAL LOW (ref 20.0–24.0)
O2 SAT: 82 %
O2 Saturation: 92 %
PCO2 ART: 54.1 mmHg — AB (ref 35.0–45.0)
PH ART: 7.152 — AB (ref 7.350–7.450)
Patient temperature: 93.6
Patient temperature: 93
TCO2: 22 mmol/L (ref 0–100)
TCO2: 24 mmol/L (ref 0–100)
pCO2 arterial: 48.6 mmHg — ABNORMAL HIGH (ref 35.0–45.0)
pH, Arterial: 7.251 — ABNORMAL LOW (ref 7.350–7.450)
pO2, Arterial: 51 mmHg — ABNORMAL LOW (ref 80.0–100.0)
pO2, Arterial: 64 mmHg — ABNORMAL LOW (ref 80.0–100.0)

## 2014-10-23 LAB — BLOOD GAS, ARTERIAL
Acid-base deficit: 6.1 mmol/L — ABNORMAL HIGH (ref 0.0–2.0)
Bicarbonate: 18.7 mEq/L — ABNORMAL LOW (ref 20.0–24.0)
DRAWN BY: 36496
FIO2: 1 %
LHR: 30 {breaths}/min
MECHVT: 450 mL
O2 SAT: 98.8 %
PATIENT TEMPERATURE: 91.4
PCO2 ART: 29.8 mmHg — AB (ref 35.0–45.0)
PEEP: 5 cmH2O
TCO2: 19.8 mmol/L (ref 0–100)
pH, Arterial: 7.391 (ref 7.350–7.450)
pO2, Arterial: 147 mmHg — ABNORMAL HIGH (ref 80.0–100.0)

## 2014-10-23 LAB — CBG MONITORING, ED: Glucose-Capillary: 179 mg/dL — ABNORMAL HIGH (ref 70–99)

## 2014-10-23 LAB — BASIC METABOLIC PANEL
ANION GAP: 9 (ref 5–15)
ANION GAP: 7 (ref 5–15)
Anion gap: 12 (ref 5–15)
BUN: 13 mg/dL (ref 6–23)
BUN: 14 mg/dL (ref 6–23)
BUN: 17 mg/dL (ref 6–23)
CALCIUM: 7.7 mg/dL — AB (ref 8.4–10.5)
CALCIUM: 7.1 mg/dL — AB (ref 8.4–10.5)
CHLORIDE: 103 mmol/L (ref 96–112)
CHLORIDE: 108 mmol/L (ref 96–112)
CO2: 24 mmol/L (ref 19–32)
CO2: 22 mmol/L (ref 19–32)
CO2: 26 mmol/L (ref 19–32)
CREATININE: 1.42 mg/dL — AB (ref 0.50–1.35)
CREATININE: 1.61 mg/dL — AB (ref 0.50–1.35)
Calcium: 7.6 mg/dL — ABNORMAL LOW (ref 8.4–10.5)
Chloride: 100 mmol/L (ref 96–112)
Creatinine, Ser: 1.42 mg/dL — ABNORMAL HIGH (ref 0.50–1.35)
GFR calc Af Amer: 56 mL/min — ABNORMAL LOW (ref >90)
GFR calc Af Amer: 66 mL/min — ABNORMAL LOW (ref >90)
GFR calc non Af Amer: 57 mL/min — ABNORMAL LOW (ref >90)
GFR calc non Af Amer: 49 mL/min — ABNORMAL LOW (ref >90)
GFR calc non Af Amer: 57 mL/min — ABNORMAL LOW (ref >90)
GFR, EST AFRICAN AMERICAN: 66 mL/min — AB (ref >90)
GLUCOSE: 226 mg/dL — AB (ref 70–99)
Glucose, Bld: 228 mg/dL — ABNORMAL HIGH (ref 70–99)
Glucose, Bld: 166 mg/dL — ABNORMAL HIGH (ref 70–99)
POTASSIUM: 4.6 mmol/L (ref 3.5–5.1)
Potassium: 3.4 mmol/L — ABNORMAL LOW (ref 3.5–5.1)
Potassium: 2.6 mmol/L — CL (ref 3.5–5.1)
SODIUM: 137 mmol/L (ref 135–145)
Sodium: 133 mmol/L — ABNORMAL LOW (ref 135–145)
Sodium: 141 mmol/L (ref 135–145)

## 2014-10-23 LAB — ETHANOL: Alcohol, Ethyl (B): <5 mg/dL (ref 0–9)

## 2014-10-23 LAB — RAPID URINE DRUG SCREEN, HOSP PERFORMED
Amphetamines: NOT DETECTED
Barbiturates: NOT DETECTED
Benzodiazepines: NOT DETECTED
COCAINE: NOT DETECTED
OPIATES: NOT DETECTED
TETRAHYDROCANNABINOL: NOT DETECTED

## 2014-10-23 LAB — URINALYSIS, ROUTINE W REFLEX MICROSCOPIC
BILIRUBIN URINE: NEGATIVE
Glucose, UA: 250 mg/dL — AB
Ketones, ur: NEGATIVE mg/dL
LEUKOCYTES UA: NEGATIVE
NITRITE: NEGATIVE
PH: 7.5 (ref 5.0–8.0)
Protein, ur: >300 mg/dL — AB
SPECIFIC GRAVITY, URINE: 1.009 (ref 1.005–1.030)
Urobilinogen, UA: 0.2 mg/dL (ref 0.0–1.0)

## 2014-10-23 LAB — GLUCOSE, CAPILLARY
GLUCOSE-CAPILLARY: 186 mg/dL — AB (ref 70–99)
GLUCOSE-CAPILLARY: 150 mg/dL — AB (ref 70–99)
Glucose-Capillary: 142 mg/dL — ABNORMAL HIGH (ref 70–99)
Glucose-Capillary: 114 mg/dL — ABNORMAL HIGH (ref 70–99)

## 2014-10-23 LAB — URINE MICROSCOPIC-ADD ON

## 2014-10-23 LAB — PHOSPHORUS: PHOSPHORUS: 5.9 mg/dL — AB (ref 2.3–4.6)

## 2014-10-23 LAB — I-STAT CG4 LACTIC ACID, ED: Lactic Acid, Venous: 8.22 mmol/L (ref 0.5–2.0)

## 2014-10-23 LAB — PROTIME-INR
INR: 1.29 (ref 0.00–1.49)
PROTHROMBIN TIME: 16.2 s — AB (ref 11.6–15.2)

## 2014-10-23 LAB — APTT: APTT: 37 s (ref 24–37)

## 2014-10-23 LAB — MAGNESIUM: Magnesium: 2.1 mg/dL (ref 1.5–2.5)

## 2014-10-23 LAB — LACTIC ACID, PLASMA: Lactic Acid, Venous: 3.5 mmol/L (ref 0.5–2.0)

## 2014-10-23 LAB — MRSA PCR SCREENING: MRSA BY PCR: NEGATIVE

## 2014-10-23 LAB — LIPASE, BLOOD: LIPASE: 34 U/L (ref 11–59)

## 2014-10-23 MED ORDER — POTASSIUM CHLORIDE 10 MEQ/50ML IV SOLN: 10.0000 meq | INTRAVENOUS | Status: DC

## 2014-10-23 MED ORDER — MIDAZOLAM HCL 2 MG/2ML IJ SOLN: 2.0000 mg | INTRAMUSCULAR | Status: DC | PRN

## 2014-10-23 MED ORDER — FENTANYL CITRATE 0.05 MG/ML IJ SOLN: 100.0000 ug | INTRAMUSCULAR | Status: DC | PRN

## 2014-10-23 MED ORDER — ATROPINE SULFATE 0.1 MG/ML IJ SOLN
INTRAMUSCULAR | Status: AC
  Filled 2014-10-23: qty 10

## 2014-10-23 MED ORDER — EPINEPHRINE HCL 1 MG/ML IJ SOLN
0.5000 ug/min | INTRAVENOUS | Status: DC
  Administered 2014-10-23 (×3): 12 ug/min via INTRAVENOUS
  Administered 2014-10-24: 8 ug/min via INTRAVENOUS
  Administered 2014-10-24: 10 ug/min via INTRAVENOUS
  Filled 2014-10-23 (×5): qty 4

## 2014-10-23 MED ORDER — SODIUM CHLORIDE 0.9 % IV SOLN
0.5000 ug/kg/min | Freq: Once | INTRAVENOUS | Status: DC
  Filled 2014-10-23: qty 20

## 2014-10-23 MED ORDER — DOPAMINE-DEXTROSE 3.2-5 MG/ML-% IV SOLN
INTRAVENOUS | Status: AC | PRN
  Administered 2014-10-23: 800 mg via INTRAVENOUS
  Administered 2014-10-23: 5 ug/kg/min via INTRAVENOUS

## 2014-10-23 MED ORDER — POTASSIUM CHLORIDE 10 MEQ/100ML IV SOLN: 10.0000 meq | INTRAVENOUS | Status: DC

## 2014-10-23 MED ORDER — SODIUM CHLORIDE 0.9 % IV SOLN
1.0000 mg/h | Freq: Once | INTRAVENOUS | Status: AC
  Administered 2014-10-23: 1 mg/h via INTRAVENOUS
  Filled 2014-10-23: qty 10

## 2014-10-23 MED ORDER — INSULIN ASPART 100 UNIT/ML ~~LOC~~ SOLN
0.0000 [IU] | Freq: Three times a day (TID) | SUBCUTANEOUS | Status: DC
  Administered 2014-10-23: 4 [IU] via SUBCUTANEOUS

## 2014-10-23 MED ORDER — INSULIN ASPART 100 UNIT/ML ~~LOC~~ SOLN
2.0000 [IU] | SUBCUTANEOUS | Status: DC
  Administered 2014-10-23 (×2): 2 [IU] via SUBCUTANEOUS

## 2014-10-23 MED ORDER — SODIUM CHLORIDE 0.9 % IV SOLN
INTRAVENOUS | Status: DC
  Administered 2014-10-23 – 2014-10-24 (×3): via INTRAVENOUS

## 2014-10-23 MED ORDER — POTASSIUM CHLORIDE 10 MEQ/50ML IV SOLN
10.0000 meq | INTRAVENOUS | Status: AC
  Administered 2014-10-23 (×6): 10 meq via INTRAVENOUS
  Filled 2014-10-23 (×5): qty 50

## 2014-10-23 MED ORDER — DEXTROSE 5 % IV SOLN
500.0000 mg | INTRAVENOUS | Status: DC
  Filled 2014-10-23: qty 500

## 2014-10-23 MED ORDER — ASPIRIN 300 MG RE SUPP: 300.0000 mg | RECTAL | Status: DC

## 2014-10-23 MED ORDER — ALBUTEROL SULFATE (2.5 MG/3ML) 0.083% IN NEBU: 2.5000 mg | INHALATION_SOLUTION | RESPIRATORY_TRACT | Status: DC | PRN

## 2014-10-23 MED ORDER — CETYLPYRIDINIUM CHLORIDE 0.05 % MT LIQD
7.0000 mL | Freq: Four times a day (QID) | OROMUCOSAL | Status: DC
  Administered 2014-10-23 – 2014-10-24 (×5): 7 mL via OROMUCOSAL

## 2014-10-23 MED ORDER — POTASSIUM CHLORIDE 20 MEQ PO PACK: 40.0000 meq | PACK | Freq: Once | ORAL | Status: DC

## 2014-10-23 MED ORDER — DOCUSATE SODIUM 50 MG/5ML PO LIQD
100.0000 mg | Freq: Two times a day (BID) | ORAL | Status: DC | PRN
  Filled 2014-10-23: qty 10

## 2014-10-23 MED ORDER — SODIUM CHLORIDE 0.9 % IV BOLUS (SEPSIS)
1000.0000 mL | Freq: Once | INTRAVENOUS | Status: AC
  Administered 2014-10-23: 1000 mL via INTRAVENOUS

## 2014-10-23 MED ORDER — DOPAMINE-DEXTROSE 3.2-5 MG/ML-% IV SOLN
0.0000 ug/kg/min | INTRAVENOUS | Status: DC
Start: 2014-10-23 — End: 2014-10-24
  Administered 2014-10-23: 8 ug/kg/min via INTRAVENOUS
  Administered 2014-10-23: 10 ug/kg/min via INTRAVENOUS
  Administered 2014-10-24: 12 ug/kg/min via INTRAVENOUS
  Filled 2014-10-23 (×2): qty 250

## 2014-10-23 MED ORDER — CEFTRIAXONE SODIUM IN DEXTROSE 20 MG/ML IV SOLN
1.0000 g | INTRAVENOUS | Status: DC
  Filled 2014-10-23: qty 50

## 2014-10-23 MED ORDER — SODIUM CHLORIDE 0.9 % IV SOLN
2000.0000 mL | Freq: Once | INTRAVENOUS | Status: AC
  Administered 2014-10-23: 2000 mL via INTRAVENOUS

## 2014-10-23 MED ORDER — SODIUM CHLORIDE 0.9 % IV SOLN: 250.0000 mL | INTRAVENOUS | Status: DC | PRN

## 2014-10-23 MED ORDER — POTASSIUM CHLORIDE 20 MEQ/15ML (10%) PO SOLN
40.0000 meq | Freq: Once | ORAL | Status: AC
  Administered 2014-10-23: 40 meq via ORAL
  Filled 2014-10-23 (×2): qty 30

## 2014-10-23 MED ORDER — EPINEPHRINE HCL 1 MG/ML IJ SOLN
0.5000 ug/min | Freq: Once | INTRAVENOUS | Status: DC
  Administered 2014-10-23: 3.8 ug/min via INTRAVENOUS
  Filled 2014-10-23: qty 4

## 2014-10-23 MED ORDER — SODIUM CHLORIDE 0.9 % IV SOLN
3.0000 g | Freq: Three times a day (TID) | INTRAVENOUS | Status: DC
  Administered 2014-10-23 – 2014-10-24 (×4): 3 g via INTRAVENOUS
  Filled 2014-10-23 (×6): qty 3

## 2014-10-23 MED ORDER — SODIUM CHLORIDE 0.9 % IV SOLN
0.0000 ug/h | Freq: Once | INTRAVENOUS | Status: AC
  Administered 2014-10-23: 50 ug/h via INTRAVENOUS
  Filled 2014-10-23: qty 50

## 2014-10-23 MED ORDER — HEPARIN SODIUM (PORCINE) 5000 UNIT/ML IJ SOLN
5000.0000 [IU] | Freq: Three times a day (TID) | INTRAMUSCULAR | Status: DC
  Administered 2014-10-23 – 2014-10-24 (×5): 5000 [IU] via SUBCUTANEOUS
  Filled 2014-10-23 (×6): qty 1

## 2014-10-23 MED ORDER — PANTOPRAZOLE SODIUM 40 MG IV SOLR
40.0000 mg | INTRAVENOUS | Status: DC
  Administered 2014-10-23 – 2014-10-24 (×2): 40 mg via INTRAVENOUS
  Filled 2014-10-23 (×3): qty 40

## 2014-10-23 MED ORDER — CHLORHEXIDINE GLUCONATE 0.12 % MT SOLN
15.0000 mL | Freq: Two times a day (BID) | OROMUCOSAL | Status: DC
  Administered 2014-10-23 – 2014-10-24 (×3): 15 mL via OROMUCOSAL
  Filled 2014-10-23: qty 15

## 2014-10-23 MED ORDER — INSULIN ASPART 100 UNIT/ML ~~LOC~~ SOLN
0.0000 [IU] | Freq: Every day | SUBCUTANEOUS | Status: DC
Start: 2014-10-23 — End: 2014-10-23

## 2014-10-23 MED ORDER — EPINEPHRINE HCL 1 MG/ML IJ SOLN
0.5000 ug/min | INTRAVENOUS | Status: DC
  Filled 2014-10-23: qty 4

## 2014-10-23 NOTE — Code Documentation (Signed)
Ice bags in gtoin under head and under both axillas

## 2014-10-23 NOTE — ED Notes (Signed)
Ice bags removed per md order discarded

## 2014-10-23 NOTE — Procedures (Signed)
Arterial Catheter Insertion Procedure Note Martin Singh [id number] May 26, 1966  Procedure: Insertion of Arterial Catheter  Indications: Blood pressure monitoring and Frequent blood sampling  Procedure Details Consent: Unable to obtain consent because of emergent medical necessity. Time Out: Verified patient identification, verified procedure, site/side was marked, verified correct patient position, special equipment/implants available, medications/allergies/relevent history reviewed, required imaging and test results available.  Maximum sterile technique was used including antiseptics, gloves, gown, hand hygiene, mask and sheet. Skin prep: Chlorhexidine; local anesthetic administered 22 gauge catheter was inserted into right femoral artery using the Seldinger technique.  Evaluation Blood flow good; BP tracing good. Complications: No apparent complications.   Martin Singh, Martin Singh 03-Apr-2015

## 2014-10-23 NOTE — Code Documentation (Signed)
Cooling discontinued  cpor  intermittently

## 2014-10-23 NOTE — Code Documentation (Signed)
Chest compressiions. criuticalk care at the bedside

## 2014-10-23 NOTE — ED Notes (Signed)
Dr Norlene Campbelltter given a copy of lactic acid results 8.22

## 2014-10-23 NOTE — Progress Notes (Addendum)
Martin NamNotified Trimble, MD re: high blood pressures. No further order. Will continue to monitor.  Verbal order to discontinue running infusions of Versed and Fentanyl and allow ventilator dyssynchrony, attempting NOT to use Fentanyl pushes to not cloud underlying neurological status.

## 2014-10-23 NOTE — H&P (Signed)
 PULMONARY / CRITICAL CARE MEDICINE HISTORY AND PHYSICAL EXAMINATION   Name: Martin Singh MRN: [id number] DOB: 1966-01-21    ADMISSION DATE:  03/22/15  PRIMARY SERVICE: PCCM  CHIEF COMPLAINT:  Cardiac Arrest  BRIEF PATIENT DESCRIPTION: 49 y/o M w/ hx of recurrent suicide attempts presenting w/ cardiac arrest after huffing contents of compressed "duster" can.  SIGNIFICANT EVENTS / STUDIES:  CPR x3 in ED  LINES / TUBES: ETT - 2/27 OGT - 2/27 R fem art line - 2/27 R fem CVC - 2/27  CULTURES: None  ANTIBIOTICS: None  HISTORY OF PRESENT ILLNESS:   Mr. Martin Singh is a 49 y/o man with a significant psychiatric history of three suicide attempts in the last two months in addition to the problems listed below who was brought to the ED by EMS after his fiancee found him unresponsive after he apparently inhaled the contents of a compressed dusting canister. He has been known to abuse these products in the past. When EMS arrived, his initial rhythm was asystole. CPR was started, and he developed PEA, and eventually ROSC. He was brought to the ED and again developed PEA twice more with ROSC achieved both times. It is not known how long he was down without a pulse, but his wife estimates it was greater than 5 minutes, and possibly greater than 10 minutes.  PAST MEDICAL HISTORY :  Past Medical History  Diagnosis Date  . Hypertension   . Anxiety and depression   . Osteoarthrosis, unspecified whether generalized or localized, unspecified site   . Irritable bowel syndrome   . GERD (gastroesophageal reflux disease)     h/o esophagitis  . Allergic rhinitis   . Insomnia   . Diverticulosis   . Alcohol abuse 2012  . Polysubstance abuse   . Seizures   . Alcoholic pancreatitis   . Periumbilical hernia 12/13/12  . Fatty liver 10/08/11  . Internal hemorrhoids 03/13/11  . Hiatal hernia 03/13/11  . Asthmatic bronchitis   . Frequent headaches   . 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 Prediabetes  03/01/2014  . Depression   . Anxiety    Past Surgical History  Procedure Laterality Date  . Tibula surgery Right 2002   Prior to Admission medications   Medication Sig Start Date End Date Taking? Authorizing Provider  albuterol (PROVENTIL HFA;VENTOLIN HFA) 108 (90 BASE) MCG/ACT inhaler Inhale 2 puffs into the lungs every 6 (six) hours as needed for wheezing or shortness of breath. 10/18/14   Sanjuana KavaAgnes I Nwoko, NP  benztropine (COGENTIN) 0.5 MG tablet Take 1 tablet (0.5 mg total) by mouth 2 (two) times daily in the am and at bedtime.. For prevention of drug induced tremors 10/18/14   Sanjuana KavaAgnes I Nwoko, NP  budesonide-formoterol [facility](SYMBICORT) 160-4.5 MCG/ACT inhaler Inhale 2 puffs into the lungs 2 (two) times daily. For asthma 10/18/14   Sanjuana KavaAgnes I Nwoko, NP  carbamazepine (TEGRETOL XR) 400 MG 12 hr tablet Take 1 tablet (400 mg total) by mouth 2 (two) times daily. For mood stabilization 10/18/14   Sanjuana KavaAgnes I Nwoko, NP  carvedilol (COREG) 12.5 MG tablet Take 1 tablet (12.5 mg total) by mouth 2 (two) times daily with a meal. For high blood pressure 10/19/14   Jeanine LuzGregory Calone, FNP  cloNIDine (CATAPRES) 0.1 MG tablet Take 1 tablet (0.1 mg total) by mouth daily before breakfast. For high blood pressure 10/19/14   Jeanine LuzGregory Calone, FNP  clotrimazole (LOTRIMIN) 1 % cream Apply topically 2 (two) times daily. For fungal rash 10/18/14   Nelda MarseilleAgnes I  Nwoko, NP  esomeprazole (NEXIUM) 40 MG capsule Take 1 capsule (40 mg total) by mouth daily at 12 noon. For acid reflux 10/18/14   Sanjuana Kava, NP  fesoterodine (TOVIAZ) 4 MG TB24 tablet Take 1 tablet (4 mg total) by mouth daily. For urinary problems 10/18/14   Sanjuana Kava, NP  gabapentin (NEURONTIN) 300 MG capsule Take 1 capsule (300 mg total) by mouth 4 (four) times daily -  with meals and at bedtime. For agitation 10/19/14   Jeanine Luz, FNP  hydrochlorothiazide (HYDRODIURIL) 25 MG tablet Take 1 tablet (25 mg total) by mouth daily. For high blood pressure 10/18/14   Sanjuana Kava, NP   ipratropium (ATROVENT) 0.03 % nasal spray Two sprays each nostril 2-3 times daily prn: For runny nose 10/18/14   Sanjuana Kava, NP  Multiple Vitamin (MULTIVITAMIN WITH MINERALS) TABS tablet Take 1 tablet by mouth daily. For low vitamin 10/18/14   Sanjuana Kava, NP  risperiDONE (RISPERDAL M-TABS) 1 MG disintegrating tablet Take 1 tablet (1 mg total) by mouth 2 (two) times daily in the am and at bedtime.. For mood control 10/22/14   Jeanine Luz, FNP  sertraline (ZOLOFT) 50 MG tablet Take 1 tablet (50 mg total) by mouth daily. For depression 10/19/14   Jeanine Luz, FNP  topiramate (TOPAMAX) 25 MG tablet Take 1 tablet (25 mg total) by mouth daily. Migraine headaches 10/18/14   Sanjuana Kava, NP  traZODone (DESYREL) 50 MG tablet Take 1 tablet (50 mg total) by mouth at bedtime as needed for sleep. 10/19/14   Jeanine Luz, FNP   Allergies  Allergen Reactions  . Codeine Anaphylaxis  . Benazepril Cough  . Ativan [Lorazepam] Other (See Comments)    Becomes violent and hallucinates  . Corticosteroids Other (See Comments)    Shaky and high blood pressure  " I can't have too high of a steroid"    FAMILY HISTORY:  Family History  Problem Relation Age of Onset  . Heart disease Mother   . Gallbladder disease Mother   . Nephrolithiasis Mother   . Arthritis Mother   . Hyperlipidemia Mother   . Stroke Mother   . Irritable bowel syndrome Mother   . Cancer Mother     vulva  . Colon cancer Neg Hx   . Hypertension Father     moth  . Heart disease Father   . Arthritis Father   . Irritable bowel syndrome Brother     x 2   SOCIAL HISTORY:  reports that he quit smoking about 23 years ago. His smokeless tobacco use includes Snuff. He reports that he does not drink alcohol or use illicit drugs.  REVIEW OF SYSTEMS:  Unable to assess  SUBJECTIVE:   VITAL SIGNS: Pulse Rate:  [39-135] 71 (02/27 0316) Resp:  [15-38] 38 (02/27 0315) BP: (50-197)/(34-114) 180/111 mmHg (02/27 0316) SpO2:  [0 %-100 %]  100 % (02/27 0316) FiO2 (%):  [40 %-100 %] 40 % (02/27 0315) HEMODYNAMICS:   VENTILATOR SETTINGS: Vent Mode:  [-] PRVC FiO2 (%):  [40 %-100 %] 40 % Set Rate:  [14 bmp-30 bmp] 30 bmp Vt Set:  [450 mL-500 mL] 450 mL PEEP:  [5 cmH20] 5 cmH20 Plateau Pressure:  [26 cmH20] 26 cmH20 INTAKE / OUTPUT: Intake/Output    None     PHYSICAL EXAMINATION: General:  Cold man, intubated, mottled appearance Neuro:  Pupils are fixed and dilated, no corneal reflexes, no response to noxious stimulus. He does spontaneously move and initiate  breaths on the ventilator. HEENT:  MMM Neck: No JVD Cardiovascular:  Cold, poor peripheral pulses Lungs:  CTAB Abdomen:  Distended Musculoskeletal:  No deformities Skin:  Mottled  LABS:  CBC No results for input(s): WBC, HGB, HCT, PLT in the last 168 hours. Coag's  Recent Labs Lab 10/22/2014 0123  APTT 37  INR 1.29   BMET  Recent Labs Lab 09/29/2014 0123  NA 132*  K 4.9  CL 97  CO2 21  BUN 10  CREATININE 1.51*  GLUCOSE 255*   Electrolytes  Recent Labs Lab 10/19/2014 0123  CALCIUM 8.7   Sepsis Markers  Recent Labs Lab  0135  LATICACIDVEN 8.22*   ABG  Recent Labs Lab 10/02/2014 0254  PHART 7.228*  PCO2ART 54.8*  PO2ART 456.0*   Liver Enzymes  Recent Labs Lab 10/09/2014 0123  AST 93*  ALT 95*  ALKPHOS 86  BILITOT 0.4  ALBUMIN 3.9   Cardiac Enzymes  Recent Labs Lab 10/14/2014 0123  TROPONINI <0.03   Glucose  Recent Labs Lab 10/14/2014 0257  GLUCAP 179*    Imaging Dg Chest Port 1 View  10/13/2014   CLINICAL DATA:  Attempted suicide. Endotracheal tube placement. Currently coding.  EXAM: PORTABLE CHEST - 1 VIEW  COMPARISON:  09/26/2014  FINDINGS: Endotracheal tube tip measures 1.4 cm above the carina. Enteric tube tip is below the left hemidiaphragm with proximal side hole above the left hemidiaphragm. Tip is likely in the upper stomach and proximal side hole in the distal esophagus. Shallow inspiration with  linear atelectasis in the right lung base. Normal heart size and pulmonary vascularity for technique. No pneumothorax.  IMPRESSION: Endotracheal to tip measures 1.4 cm above the carina. Enteric tube tip is in the region of the upper stomach with distal side hole probably in the lower esophagus.   Electronically Signed   By: Burman Nieves M.D.   On: 10/02/2014 02:00    EKG: pending CXR: Intubated, no infiltrate  ASSESSMENT / PLAN:  Active Problems:   Cardiac arrest   PULMONARY A: Need for Mechanical Ventilation P:   Will maintain on lung-protective mode of ventilation.  CARDIOVASCULAR A: S/p Cardiac Arrest Cardiogenic Shock HTN P:   Likely due to arrhythmia and/or hypoxia as a result of huffing volatile organic propellant in dusting canister. Will continue to provide supportive care. We elect not to initiate cooling protocol due to primary rhythm of asystole, recurrent cardiac arrest, and current neurologic exam suggesting total devastation.  RENAL A: AKI Hyponatremia (chonic) P:   Hydrate and follow labs.  GASTROINTESTINAL A: Transaminitis P:   LIkely due to shock liver. Will trend.  HEMATOLOGIC A: No active issues P:     INFECTIOUS A: No active issues P:     ENDOCRINE A: Hyperglycemia P:   Will maintain using ICU protocol.  NEUROLOGIC A: Ischemic/hypoxic brain injury P:   Current neurologic exam does not provide reassurance of functional outcome. This was discussed with the patient's fiancee (could not get a hold of the patient's father, his next of kin), and she begged [place] to "try to do something," so we agreed to keep him full code until this morning when other family members can be researched and determine what is best for him.  BEST PRACTICE / DISPOSITION Level of Care:  ICU Primary Service:  PCCM Consultants:  None Code Status:  Full Diet:  NPO DVT Px:  Hep ppx GI Px:  None Skin Integrity:  Intact Social / Family:  Updated on  admission  TODAY'S SUMMARY: 49 y/o man w/ cardiac arrest following huffing compressed duster, now w/ complete neurologic devastation.  I have personally obtained a history, examined the patient, evaluated laboratory and imaging results, formulated the assessment and plan and placed orders.  CRITICAL CARE: The patient is critically ill with multiple organ systems failure and requires high complexity decision making for assessment and support, frequent evaluation and titration of therapies, application of advanced monitoring technologies and extensive interpretation of multiple databases. Critical Care Time devoted to patient care services described in this note is 126 minutes.   Jamie Kato, MD Pulmonary and Critical Care Medicine [facility] Pager: [phone]   10/16/2014, 3:33 AM

## 2014-10-23 NOTE — ED Notes (Signed)
Dopamine drip and epin drip discontinued per md

## 2014-10-23 NOTE — Code Documentation (Signed)
artic sun  Per dr Norlene Campbellotter

## 2014-10-23 NOTE — Code Documentation (Signed)
junctiinal rhy.  epiu drip started SunGardjames rn

## 2014-10-23 NOTE — Code Documentation (Signed)
cpr continues

## 2014-10-23 NOTE — Progress Notes (Signed)
Critical Labs Lactic Acid 3.5 and Troponin 0.70 reported to Canary BrimBrandi Ollis, NP.  Zenovia JordanBridget Zyonna Vardaman, RN

## 2014-10-23 NOTE — Progress Notes (Signed)
Bedside EEG completed. Results pending.  

## 2014-10-23 NOTE — Code Documentation (Signed)
epine bristojet iv james rn

## 2014-10-23 NOTE — Code Documentation (Signed)
#  20 rt a-c per  Karle BarrJames rn

## 2014-10-23 NOTE — Progress Notes (Signed)
Echocardiogram 2D Echocardiogram has been performed.  Stacey Drain 10/11/2014, 1:37 PM

## 2014-10-23 NOTE — Code Documentation (Signed)
Faint  Femoral pulse

## 2014-10-23 NOTE — Code Documentation (Signed)
  Patient Name: Martin Singh   MRN: [id number]   Date of Birth/ Sex: April 11, 1966 , male      Admission Date: 10/10/2014  Attending Provider: Coralyn HellingVineet Sood, MD  Primary Diagnosis: <principal problem not specified>   Indication: Code blue called. At the time of arrival on scene, ACLS protocol was underway. Femoral pulse was palpated.    Technical Description:  - CPR performance duration:  2 minutes  - Was defibrillation or cardioversion used? No   - Was external pacer placed? Yes  - Was patient intubated pre/post CPR? Yes   Medications Administered: Y = Yes; Blank = No Amiodarone    Atropine    Calcium    Epinephrine  Y  Lidocaine    Magnesium    Norepinephrine    Phenylephrine    Sodium bicarbonate    Vasopressin     Post CPR evaluation:  - Final Status - Was patient successfully resuscitated ? Yes - What is current rhythm? Sinus rhythm - What is current hemodynamic status? guarded  Miscellaneous Information:  - Labs sent, including: yes  - Primary team notified?  Yes  - Family Notified? Yes  - Additional notes/ transfer status: n/a     Gara Kroneriana Truong, MD  10/09/2014, 7:34 AM

## 2014-10-23 NOTE — Procedures (Signed)
Central Venous Catheter Insertion Procedure Note Presley Raddleldon V Toenjes [id number] [birth year]  Procedure: Insertion of Central Venous Catheter Indications: Assessment of intravascular volume, Drug and/or fluid administration and Frequent blood sampling  Procedure Details Consent: Unable to obtain consent because of emergent medical necessity. Time Out: Verified patient identification, verified procedure, site/side was marked, verified correct patient position, special equipment/implants available, medications/allergies/relevent history reviewed, required imaging and test results available.   Maximum sterile technique was used including antiseptics, gloves, gown, hand hygiene, mask and sheet. Skin prep: Chlorhexidine; local anesthetic administered A antimicrobial bonded/coated triple lumen catheter was placed in the right femoral vein due to emergent situation using the Seldinger technique.  Evaluation Blood flow good Complications: No apparent complications Patient did tolerate procedure well.  Lessie Funderburke 01/04/2015, 4:13 AM

## 2014-10-23 NOTE — Code Documentation (Signed)
The pts has a femoral lpulse again p 97

## 2014-10-23 NOTE — Code Documentation (Signed)
Cc doctor attempting a femoral central line

## 2014-10-23 NOTE — Procedures (Signed)
ELECTROENCEPHALOGRAM REPORT   Patient: Martin Singh       Room #: [id number] EEG No. ID: [id number] Age: 49 y.o.        Sex: male Referring Physician: Craige CottaSood Report Date:  10/15/2014        Interpreting Physician: Thana FarrEYNOLDS, Zackry Deines  History: Martin Singh is an 49 y.o. male unresponsive s/p cardiac arrest  Medications:  Scheduled: . ampicillin-sulbactam (UNASYN) IV  3 g Intravenous 3 times per day  . antiseptic oral rinse  7 mL Mouth Rinse QID  . aspirin  300 mg Rectal NOW  . atropine      . chlorhexidine  15 mL Mouth Rinse BID  . heparin  5,000 Units Subcutaneous 3 times per day  . insulin aspart  2-6 Units Subcutaneous 6 times per day  . pantoprazole (PROTONIX) IV  40 mg Intravenous Q24H  . potassium chloride  10 mEq Intravenous Q1 Hr x 6    Conditions of Recording:  This is a 16 channel EEG carried out with the patient in the intubated and unresponsive state.  Description:  The background activity is markedly attenuated.  No background activity can be appreciated and only EKG artifact is noted.  Despite increase in sensitivity to 5uV/mm there is no change in the background activity.   No epileptiform activity is noted.   Painful stimulation was applied with no change in the background activity noted.   Hyperventilation and intermittent photic stimulation were not performed.   IMPRESSION: This is a markedly abnormal electroencephalogram.  No cerebral activity was noted.  No epileptiform activity was noted.  COMMENT: Formal brain death protocol was not used for this recording   Thana FarrLeslie Sakinah Rosamond, MD [facility] [phone] 10/18/2014, 4:43 PM  .Baldo Daublre

## 2014-10-23 NOTE — Progress Notes (Signed)
eLink Physician-Brief Progress Note Patient Name: Martin Singh DOB: June 07, 1966 MRN: [id number]   Date of Service  10/05/2014  HPI/Events of Note   Hypokalemia   eICU Interventions   Repleting      Intervention Category Intermediate Interventions: Electrolyte abnormality - evaluation and management  Jasim Harari R. 10/06/2014, 3:10 PM

## 2014-10-23 NOTE — Code Documentation (Signed)
Sod bicard iv james rn. cpr started agfain

## 2014-10-23 NOTE — Code Documentation (Signed)
Epi bristojet james rn

## 2014-10-23 NOTE — Progress Notes (Signed)
eLink Physician-Brief Progress Note Patient Name: Martin Singh DOB: 1966-07-19 MRN: [id number]   Date of Service  10/13/2014  HPI/Events of Note  Best Practice  eICU Interventions  Protonix for stress ulcer propy while intubated     Intervention Category Intermediate Interventions: Best-practice therapies (e.g. DVT, beta blocker, etc.)  DETERDING,ELIZABETH 10/20/2014, 5:53 AM

## 2014-10-23 NOTE — Progress Notes (Signed)
ANTIBIOTIC CONSULT NOTE - INITIAL  Pharmacy Consult for Unasyn Indication: asp pna, CAP  Allergies  Allergen Reactions  . Codeine Anaphylaxis  . Benazepril Cough  . Ativan [Lorazepam] Other (See Comments)    Becomes violent and hallucinates  . Corticosteroids Other (See Comments)    Shaky and high blood pressure  " I can't have too high of a steroid"    Patient Measurements: Weight: 188 lb 7.9 oz (85.5 kg)  Vital Signs: Temp: 91.7 F (33.2 C) (02/27 0500) Temp Source: Core (Comment) (02/27 0500) BP: 193/131 mmHg (02/27 0500) Pulse Rate: 77 (02/27 0755) Intake/Output from previous day: 02/26 0701 - 02/27 0700 In: 2000 [I.V.:2000] Out: 625 [Urine:625] Intake/Output from this shift: Total I/O In: -  Out: 325 [Urine:325]  Labs:  Recent Labs  Jun 28, 2015 0123 Jun 28, 2015 0500 Jun 28, 2015 0641  WBC  --  10.1 12.0*  HGB  --  15.2 13.9  PLT  --  447* 397  CREATININE 1.51* 1.42*  --    Estimated Creatinine Clearance: 57.1 mL/min (by C-G formula based on Cr of 1.42). No results for input(s): VANCOTROUGH, VANCOPEAK, VANCORANDOM, GENTTROUGH, GENTPEAK, GENTRANDOM, TOBRATROUGH, TOBRAPEAK, TOBRARND, AMIKACINPEAK, AMIKACINTROU, AMIKACIN in the last 72 hours.   Microbiology: Recent Results (from the past 720 hour(s))  MRSA PCR Screening     Status: None   Collection Time: Jun 28, 2015  5:57 AM  Result Value Ref Range Status   MRSA by PCR NEGATIVE NEGATIVE Final    Comment:        The GeneXpert MRSA Assay (FDA approved for NASAL specimens only), is one component of a comprehensive MRSA colonization surveillance program. It is not intended to diagnose MRSA infection nor to guide or monitor treatment for MRSA infections.     Medical History: Past Medical History  Diagnosis Date  . Hypertension   . Anxiety and depression   . Osteoarthrosis, unspecified whether generalized or localized, unspecified site   . Irritable bowel syndrome   . GERD (gastroesophageal reflux disease)      h/o esophagitis  . Allergic rhinitis   . Insomnia   . Diverticulosis   . Alcohol abuse 2012  . Polysubstance abuse   . Seizures   . Alcoholic pancreatitis   . Periumbilical hernia 12/13/12  . Fatty liver 10/08/11  . Internal hemorrhoids 03/13/11  . Hiatal hernia 03/13/11  . Asthmatic bronchitis   . Frequent headaches   . 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 Prediabetes 03/01/2014  . Depression   . Anxiety     Medications:  Awaiting home med rec  Assessment: 49 y.o. male presents s/p cardiac arrest after huffing content of compressed "duster" can. Pt with h/o multiple recurrent suicide attempts (3 in past 2 months).  2/27 Unasyn>>  2/27 Urine>> MRSA PCR neg  Goal of Therapy:  Resolution of infection  Plan:  1. Unasyn 3gm IV q8h 2. Will f/u renal function, micro data, pt's clinical condition  Christoper Fabianaron Jaeshawn Silvio, PharmD, BCPS Clinical pharmacist, pager [phone] 03/29/15,9:05 AM

## 2014-10-23 NOTE — Progress Notes (Signed)
Chaplain was informed of patient's arrival to ED with CPR in progress.  There was no contact with the patient at his time of arrival due to medical interventions being done by medical staff.  There was no family present at the time at the time of Chaplain's visit. Will follow-up at a later time to check medical status.  He was transferred to [id number].  Chaplain received a call from Nursing Unit to provide spiritual support for wife who is very concerned with his husband's medical status.  He has been coded several times and his medical status is critical at this time.  Chaplain will refer follow-up to assigned Unit Chaplain for follow-up. On-Call Chaplain Janell QuietAudrey Thornton [phone]

## 2014-10-23 NOTE — Progress Notes (Deleted)
eLink Physician-Brief Progress Note Patient Name: Martin Singh DOB: 1966-02-10 MRN: [id number]   Date of Service  10/08/2014  HPI/Events of Note  Hypokalemia  eICU Interventions  Potassium replaced     Intervention Category Intermediate Interventions: Best-practice therapies (e.g. DVT, beta blocker, etc.)  DETERDING,ELIZABETH 10/19/2014, 5:52 AM

## 2014-10-23 NOTE — Progress Notes (Addendum)
INITIAL NUTRITION ASSESSMENT  DOCUMENTATION CODES Per approved criteria  -Obesity Unspecified   INTERVENTION: -When medically able, recommend VITAL High Protein via OGT @ 25 ml/h; on day 2, increase to goal rate of 80ml/h (1200 ml per day) to provide 1200 kcals, 105 gm protein, 1003 ml free water daily.  NUTRITION DIAGNOSIS: Inadequate oral intake related to inability to eat as evidenced by NPO status   Goal: Enteral nutrition to provide 60-70% of estimated calorie needs (22-25 kcals/kg ideal body weight) and 100% of estimated protein needs, based on ASPEN guidelines for hypocaloric, high protein feeding in critically ill obese individuals  Monitor:  Initiation of TF, TF tolerance, labs, weight, Vent settings  Reason for Assessment: Assessment and Recommendations for TF  49 y.o. male  Admitting Dx: <principal problem not specified>  ASSESSMENT: 49 y/o man with a significant psychiatric history of three suicide attempts in the last two months in addition to the problems listed below who was brought to the ED by EMS after his fiancee found him unresponsive after he apparently inhaled the contents of a compressed dusting canister.   Spoke to patient friend at bedside. He did not believe patient had nay decrease in intake/weight loss prior to admit  Patient is currently intubated on ventilator support MV: 15.3 L/min Temp (24hrs), Avg:91.6 F (33.1 C), Min:90.9 F (32.7 C), Max:92.3 F (33.5 C)   Height: Ht Readings from Last 1 Encounters:  10/05/14 5' (1.524 m)    Weight: Wt Readings from Last 1 Encounters:  2014/11/12 188 lb 7.9 oz (85.5 kg)    Ideal Body Weight: 106 lbs (48.2 kg)  % Ideal Body Weight: 177%  Wt Readings from Last 10 Encounters:  November 12, 2014 188 lb 7.9 oz (85.5 kg)  10/05/14 180 lb (81.647 kg)  09/29/14 181 lb 3.2 oz (82.192 kg)  09/15/14 176 lb (79.833 kg)  09/07/14 179 lb 9.6 oz (81.466 kg)  09/07/14 180 lb 1.9 oz (81.702 kg)  09/02/14 181 lb 4.8  oz (82.237 kg)  08/17/14 178 lb 12.8 oz (81.103 kg)  08/10/14 182 lb (82.555 kg)  07/28/14 180 lb (81.647 kg)    Usual Body Weight: ~180  % Usual Body Weight: 104%  BMI:  Body mass index is 36.81 kg/(m^2).  Estimated Nutritional Needs: Kcal: 1949 (8119-1478 + 60-70%) Protein: >96g Pro (2g/kg ibw) Fluid: per md  Skin: WDL  Diet Order: Diet NPO time specified  EDUCATION NEEDS: -Education not appropriate at this time   Intake/Output Summary (Last 24 hours) at 11/12/2014 0840 Last data filed at Nov 12, 2014 0800  Gross per 24 hour  Intake   2000 ml  Output    950 ml  Net   1050 ml    Last BM: N/A  Labs:   Recent Labs Lab 11/12/14 0123 11/12/2014 0500  NA 132* 133*  K 4.9 4.6  CL 97 100  CO2 21 24  BUN 10 13  CREATININE 1.51* 1.42*  CALCIUM 8.7 7.7*  MG  --  2.1  PHOS  --  5.9*  GLUCOSE 255* 228*    CBG (last 3)   Recent Labs  11-12-14 0257 11-12-2014 0708  GLUCAP 179* 186*    Scheduled Meds: . aspirin  300 mg Rectal NOW  . heparin  5,000 Units Subcutaneous 3 times per day  . insulin aspart  0-20 Units Subcutaneous TID WC  . insulin aspart  0-5 Units Subcutaneous QHS  . pantoprazole (PROTONIX) IV  40 mg Intravenous Q24H    Continuous Infusions: . DOPamine  8 mcg/kg/min (2014-10-10 0730)  . epinephrine 12 mcg/min (2014-10-10 0715)    Past Medical History  Diagnosis Date  . Hypertension   . Anxiety and depression   . Osteoarthrosis, unspecified whether generalized or localized, unspecified site   . Irritable bowel syndrome   . GERD (gastroesophageal reflux disease)     h/o esophagitis  . Allergic rhinitis   . Insomnia   . Diverticulosis   . Alcohol abuse 2012  . Polysubstance abuse   . Seizures   . Alcoholic pancreatitis   . Periumbilical hernia 12/13/12  . Fatty liver 10/08/11  . Internal hemorrhoids 03/13/11  . Hiatal hernia 03/13/11  . Asthmatic bronchitis   . Frequent headaches   . 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 Prediabetes 03/01/2014  .  Depression   . Anxiety     Past Surgical History  Procedure Laterality Date  . Tibula surgery Right 2002    Christophe Louisathan Franks RD, LDN Nutrition Pager: [phone] Mar 04, 2015 8:41 AM

## 2014-10-23 NOTE — Code Documentation (Signed)
epine bristojet iv poer janes rn

## 2014-10-23 NOTE — Code Documentation (Signed)
Epi bristojet iv  Gena FrayChris rn

## 2014-10-23 NOTE — Progress Notes (Signed)
50 mL versed wasted and 200 mL fentanyl wasted in sink with Othella BoyerBridget Wiliford RN

## 2014-10-23 NOTE — ED Notes (Signed)
Io removed  From the lt tibia

## 2014-10-23 NOTE — ED Provider Notes (Signed)
 mass. There is no tenderness. There is no rebound and no guarding.  Musculoskeletal: Normal range of motion. He exhibits no edema or tenderness.  Lymphadenopathy:    He has no cervical adenopathy.  Skin: Skin is warm and dry. No rash noted. No erythema. No pallor.  Nursing note and vitals reviewed.   ED Course  INTUBATION Date/Time: 10/18/2014 8:28 AM Performed by: Olivia MackieTTER, Shanelle Clontz M Authorized by: Olivia MackieTTER, Masen Salvas M Consent: The procedure was performed in an emergent situation. Indications: respiratory failure,  airway protection and  hypoxemia Intubation method: video-assisted Patient status: unconscious Tube size: 8.0 mm Tube type: cuffed Number of attempts: 1 Cricoid pressure: yes Cords visualized: yes Post-procedure assessment: chest rise and CO2 detector Breath sounds: equal Cuff inflated: yes ETT to lip: 23 cm Tube secured with: ETT holder Chest x-ray findings: endotracheal tube in appropriate position  CRITICAL CARE Performed by: Olivia MackieTTER, Vondell Sowell M Authorized by: Olivia MackieTTER, Tc Kapusta M Total critical care time: 90 minutes Critical care time was exclusive of separately billable procedures and treating other patients. Critical care was necessary to treat or prevent imminent or life-threatening deterioration of the following conditions: cardiac failure, respiratory failure and toxidrome. Critical care was time spent personally by me on the following activities:  blood draw for specimens, development of treatment plan with patient or surrogate, discussions with consultants, gastric intubation, evaluation of patient's response to treatment, examination of patient, obtaining history from patient or surrogate, ordering and performing treatments and interventions, ordering and review of laboratory studies, ordering and review of radiographic studies, pulse oximetry, re-evaluation of patient's condition and review of old charts.   (including critical care time)  Cardiopulmonary Resuscitation (CPR) Procedure Note Directed/Performed by: Olivia MackieTTER,Navdeep Halt M I personally directed ancillary staff and/or performed CPR in an effort to regain return of spontaneous circulation and to maintain cardiac, neuro and systemic perfusion.   Labs Review Labs Reviewed  COMPREHENSIVE METABOLIC PANEL - Abnormal; Notable for the following:    Sodium 132 (*)    Glucose, Bld 255 (*)    Creatinine, Ser 1.51 (*)    AST 93 (*)    ALT 95 (*)    GFR calc non Af Amer 53 (*)    GFR calc Af Amer 61 (*)    All other components within normal limits  URINALYSIS, ROUTINE W REFLEX MICROSCOPIC - Abnormal; Notable for the following:    APPearance CLOUDY (*)    Glucose, UA 250 (*)    Hgb urine dipstick MODERATE (*)    Protein, ur >300 (*)    All other components within normal limits  PROTIME-INR - Abnormal; Notable for the following:    Prothrombin Time 16.2 (*)    All other components within normal limits  CBC - Abnormal; Notable for the following:    Platelets 447 (*)    All other components within normal limits  BASIC METABOLIC PANEL - Abnormal; Notable for the following:    Sodium 133 (*)    Glucose, Bld 228 (*)    Creatinine, Ser 1.42 (*)    Calcium 7.7 (*)    GFR calc non Af Amer 57 (*)    GFR calc Af Amer 66 (*)    All other components within normal limits  PHOSPHORUS - Abnormal; Notable for the following:    Phosphorus 5.9 (*)    All other components within normal limits   BLOOD GAS, ARTERIAL - Abnormal; Notable for the following:    pCO2 arterial 29.8 (*)    pO2, Arterial 147.0 (*)  mass. There is no tenderness. There is no rebound and no guarding.  Musculoskeletal: Normal range of motion. He exhibits no edema or tenderness.  Lymphadenopathy:    He has no cervical adenopathy.  Skin: Skin is warm and dry. No rash noted. No erythema. No pallor.  Nursing note and vitals reviewed.   ED Course  INTUBATION Date/Time: 10/18/2014 8:28 AM Performed by: Olivia MackieTTER, Shanelle Clontz M Authorized by: Olivia MackieTTER, Masen Salvas M Consent: The procedure was performed in an emergent situation. Indications: respiratory failure,  airway protection and  hypoxemia Intubation method: video-assisted Patient status: unconscious Tube size: 8.0 mm Tube type: cuffed Number of attempts: 1 Cricoid pressure: yes Cords visualized: yes Post-procedure assessment: chest rise and CO2 detector Breath sounds: equal Cuff inflated: yes ETT to lip: 23 cm Tube secured with: ETT holder Chest x-ray findings: endotracheal tube in appropriate position  CRITICAL CARE Performed by: Olivia MackieTTER, Vondell Sowell M Authorized by: Olivia MackieTTER, Tc Kapusta M Total critical care time: 90 minutes Critical care time was exclusive of separately billable procedures and treating other patients. Critical care was necessary to treat or prevent imminent or life-threatening deterioration of the following conditions: cardiac failure, respiratory failure and toxidrome. Critical care was time spent personally by me on the following activities:  blood draw for specimens, development of treatment plan with patient or surrogate, discussions with consultants, gastric intubation, evaluation of patient's response to treatment, examination of patient, obtaining history from patient or surrogate, ordering and performing treatments and interventions, ordering and review of laboratory studies, ordering and review of radiographic studies, pulse oximetry, re-evaluation of patient's condition and review of old charts.   (including critical care time)  Cardiopulmonary Resuscitation (CPR) Procedure Note Directed/Performed by: Olivia MackieTTER,Navdeep Halt M I personally directed ancillary staff and/or performed CPR in an effort to regain return of spontaneous circulation and to maintain cardiac, neuro and systemic perfusion.   Labs Review Labs Reviewed  COMPREHENSIVE METABOLIC PANEL - Abnormal; Notable for the following:    Sodium 132 (*)    Glucose, Bld 255 (*)    Creatinine, Ser 1.51 (*)    AST 93 (*)    ALT 95 (*)    GFR calc non Af Amer 53 (*)    GFR calc Af Amer 61 (*)    All other components within normal limits  URINALYSIS, ROUTINE W REFLEX MICROSCOPIC - Abnormal; Notable for the following:    APPearance CLOUDY (*)    Glucose, UA 250 (*)    Hgb urine dipstick MODERATE (*)    Protein, ur >300 (*)    All other components within normal limits  PROTIME-INR - Abnormal; Notable for the following:    Prothrombin Time 16.2 (*)    All other components within normal limits  CBC - Abnormal; Notable for the following:    Platelets 447 (*)    All other components within normal limits  BASIC METABOLIC PANEL - Abnormal; Notable for the following:    Sodium 133 (*)    Glucose, Bld 228 (*)    Creatinine, Ser 1.42 (*)    Calcium 7.7 (*)    GFR calc non Af Amer 57 (*)    GFR calc Af Amer 66 (*)    All other components within normal limits  PHOSPHORUS - Abnormal; Notable for the following:    Phosphorus 5.9 (*)    All other components within normal limits   BLOOD GAS, ARTERIAL - Abnormal; Notable for the following:    pCO2 arterial 29.8 (*)    pO2, Arterial 147.0 (*)  mass. There is no tenderness. There is no rebound and no guarding.  Musculoskeletal: Normal range of motion. He exhibits no edema or tenderness.  Lymphadenopathy:    He has no cervical adenopathy.  Skin: Skin is warm and dry. No rash noted. No erythema. No pallor.  Nursing note and vitals reviewed.   ED Course  INTUBATION Date/Time: 10/18/2014 8:28 AM Performed by: Olivia MackieTTER, Shanelle Clontz M Authorized by: Olivia MackieTTER, Masen Salvas M Consent: The procedure was performed in an emergent situation. Indications: respiratory failure,  airway protection and  hypoxemia Intubation method: video-assisted Patient status: unconscious Tube size: 8.0 mm Tube type: cuffed Number of attempts: 1 Cricoid pressure: yes Cords visualized: yes Post-procedure assessment: chest rise and CO2 detector Breath sounds: equal Cuff inflated: yes ETT to lip: 23 cm Tube secured with: ETT holder Chest x-ray findings: endotracheal tube in appropriate position  CRITICAL CARE Performed by: Olivia MackieTTER, Vondell Sowell M Authorized by: Olivia MackieTTER, Tc Kapusta M Total critical care time: 90 minutes Critical care time was exclusive of separately billable procedures and treating other patients. Critical care was necessary to treat or prevent imminent or life-threatening deterioration of the following conditions: cardiac failure, respiratory failure and toxidrome. Critical care was time spent personally by me on the following activities:  blood draw for specimens, development of treatment plan with patient or surrogate, discussions with consultants, gastric intubation, evaluation of patient's response to treatment, examination of patient, obtaining history from patient or surrogate, ordering and performing treatments and interventions, ordering and review of laboratory studies, ordering and review of radiographic studies, pulse oximetry, re-evaluation of patient's condition and review of old charts.   (including critical care time)  Cardiopulmonary Resuscitation (CPR) Procedure Note Directed/Performed by: Olivia MackieTTER,Navdeep Halt M I personally directed ancillary staff and/or performed CPR in an effort to regain return of spontaneous circulation and to maintain cardiac, neuro and systemic perfusion.   Labs Review Labs Reviewed  COMPREHENSIVE METABOLIC PANEL - Abnormal; Notable for the following:    Sodium 132 (*)    Glucose, Bld 255 (*)    Creatinine, Ser 1.51 (*)    AST 93 (*)    ALT 95 (*)    GFR calc non Af Amer 53 (*)    GFR calc Af Amer 61 (*)    All other components within normal limits  URINALYSIS, ROUTINE W REFLEX MICROSCOPIC - Abnormal; Notable for the following:    APPearance CLOUDY (*)    Glucose, UA 250 (*)    Hgb urine dipstick MODERATE (*)    Protein, ur >300 (*)    All other components within normal limits  PROTIME-INR - Abnormal; Notable for the following:    Prothrombin Time 16.2 (*)    All other components within normal limits  CBC - Abnormal; Notable for the following:    Platelets 447 (*)    All other components within normal limits  BASIC METABOLIC PANEL - Abnormal; Notable for the following:    Sodium 133 (*)    Glucose, Bld 228 (*)    Creatinine, Ser 1.42 (*)    Calcium 7.7 (*)    GFR calc non Af Amer 57 (*)    GFR calc Af Amer 66 (*)    All other components within normal limits  PHOSPHORUS - Abnormal; Notable for the following:    Phosphorus 5.9 (*)    All other components within normal limits   BLOOD GAS, ARTERIAL - Abnormal; Notable for the following:    pCO2 arterial 29.8 (*)    pO2, Arterial 147.0 (*)  Bicarbonate 18.7 (*)    Acid-base deficit 6.1 (*)    All other components within normal limits  URINE MICROSCOPIC-ADD ON - Abnormal; Notable for the following:    Bacteria, UA FEW (*)    Casts HYALINE CASTS (*)    All other components within normal limits  CBC - Abnormal; Notable for the following:    WBC 12.0 (*)    All other components within normal limits  GLUCOSE, CAPILLARY - Abnormal; Notable for the following:    Glucose-Capillary 186 (*)    All other components within normal limits  I-STAT CG4 LACTIC ACID, ED - Abnormal; Notable for the following:    Lactic Acid, Venous 8.22 (*)    All other components within normal limits  I-STAT ARTERIAL BLOOD GAS, ED - Abnormal; Notable for the following:    pH, Arterial 7.228 (*)    pCO2 arterial 54.8 (*)    pO2, Arterial 456.0 (*)    Acid-base deficit 5.0 (*)    All other components within normal limits  CBG MONITORING, ED - Abnormal; Notable for the following:    Glucose-Capillary 179 (*)    All other components within normal limits  POCT I-STAT 3, ART BLOOD GAS (G3+) - Abnormal; Notable for the following:    pH, Arterial 7.152 (*)    pCO2 arterial 54.1 (*)    pO2, Arterial 51.0 (*)    Bicarbonate 19.7 (*)    Acid-base deficit 11.0 (*)    All other components within normal limits  MRSA PCR SCREENING  URINE CULTURE  ETHANOL  LIPASE, BLOOD  URINE RAPID DRUG SCREEN (HOSP PERFORMED)  TROPONIN I  APTT  MAGNESIUM  URINALYSIS, ROUTINE W REFLEX MICROSCOPIC  BASIC METABOLIC PANEL  TROPONIN I  LACTIC ACID, PLASMA  LACTIC ACID, PLASMA    Imaging Review Dg Chest Port 1 View  10/17/2014   CLINICAL DATA:  Hypoxia.  EXAM: PORTABLE CHEST - 1 VIEW  COMPARISON:  Same day.  FINDINGS: Endotracheal tube is in grossly good position with distal tip 3.6 cm above the carina. Nasogastric tube is seen with tip in proximal stomach.  Stable cardiomediastinal silhouette. Increased multifocal airspace opacities are noted throughout both lungs concerning for pneumonia or edema. No pneumothorax or significant pleural effusion is noted. Bony thorax is intact.  IMPRESSION: Endotracheal tube is in grossly good position. Distal tip of nasogastric tube is seen in proximal stomach. Interval development of multifocal airspace opacities throughout both lungs is noted consistent with pneumonia or edema.   Electronically Signed   By: Lupita Raider, M.D.   On: 10/11/2014 08:12   Dg Chest Port 1 View     CLINICAL DATA:  Attempted suicide. Endotracheal tube placement. Currently coding.  EXAM: PORTABLE CHEST - 1 VIEW  COMPARISON:  09/26/2014  FINDINGS: Endotracheal tube tip measures 1.4 cm above the carina. Enteric tube tip is below the left hemidiaphragm with proximal side hole above the left hemidiaphragm. Tip is likely in the upper stomach and proximal side hole in the distal esophagus. Shallow inspiration with linear atelectasis in the right lung base. Normal heart size and pulmonary vascularity for technique. No pneumothorax.  IMPRESSION: Endotracheal to tip measures 1.4 cm above the carina. Enteric tube tip is in the region of the upper stomach with distal side hole probably in the lower esophagus.   Electronically Signed   By: Burman Nieves M.D.   On: 10/19/2014 02:00     EKG Interpretation None        Date:  mass. There is no tenderness. There is no rebound and no guarding.  Musculoskeletal: Normal range of motion. He exhibits no edema or tenderness.  Lymphadenopathy:    He has no cervical adenopathy.  Skin: Skin is warm and dry. No rash noted. No erythema. No pallor.  Nursing note and vitals reviewed.   ED Course  INTUBATION Date/Time: 10/18/2014 8:28 AM Performed by: Olivia MackieTTER, Shanelle Clontz M Authorized by: Olivia MackieTTER, Masen Salvas M Consent: The procedure was performed in an emergent situation. Indications: respiratory failure,  airway protection and  hypoxemia Intubation method: video-assisted Patient status: unconscious Tube size: 8.0 mm Tube type: cuffed Number of attempts: 1 Cricoid pressure: yes Cords visualized: yes Post-procedure assessment: chest rise and CO2 detector Breath sounds: equal Cuff inflated: yes ETT to lip: 23 cm Tube secured with: ETT holder Chest x-ray findings: endotracheal tube in appropriate position  CRITICAL CARE Performed by: Olivia MackieTTER, Vondell Sowell M Authorized by: Olivia MackieTTER, Tc Kapusta M Total critical care time: 90 minutes Critical care time was exclusive of separately billable procedures and treating other patients. Critical care was necessary to treat or prevent imminent or life-threatening deterioration of the following conditions: cardiac failure, respiratory failure and toxidrome. Critical care was time spent personally by me on the following activities:  blood draw for specimens, development of treatment plan with patient or surrogate, discussions with consultants, gastric intubation, evaluation of patient's response to treatment, examination of patient, obtaining history from patient or surrogate, ordering and performing treatments and interventions, ordering and review of laboratory studies, ordering and review of radiographic studies, pulse oximetry, re-evaluation of patient's condition and review of old charts.   (including critical care time)  Cardiopulmonary Resuscitation (CPR) Procedure Note Directed/Performed by: Olivia MackieTTER,Navdeep Halt M I personally directed ancillary staff and/or performed CPR in an effort to regain return of spontaneous circulation and to maintain cardiac, neuro and systemic perfusion.   Labs Review Labs Reviewed  COMPREHENSIVE METABOLIC PANEL - Abnormal; Notable for the following:    Sodium 132 (*)    Glucose, Bld 255 (*)    Creatinine, Ser 1.51 (*)    AST 93 (*)    ALT 95 (*)    GFR calc non Af Amer 53 (*)    GFR calc Af Amer 61 (*)    All other components within normal limits  URINALYSIS, ROUTINE W REFLEX MICROSCOPIC - Abnormal; Notable for the following:    APPearance CLOUDY (*)    Glucose, UA 250 (*)    Hgb urine dipstick MODERATE (*)    Protein, ur >300 (*)    All other components within normal limits  PROTIME-INR - Abnormal; Notable for the following:    Prothrombin Time 16.2 (*)    All other components within normal limits  CBC - Abnormal; Notable for the following:    Platelets 447 (*)    All other components within normal limits  BASIC METABOLIC PANEL - Abnormal; Notable for the following:    Sodium 133 (*)    Glucose, Bld 228 (*)    Creatinine, Ser 1.42 (*)    Calcium 7.7 (*)    GFR calc non Af Amer 57 (*)    GFR calc Af Amer 66 (*)    All other components within normal limits  PHOSPHORUS - Abnormal; Notable for the following:    Phosphorus 5.9 (*)    All other components within normal limits   BLOOD GAS, ARTERIAL - Abnormal; Notable for the following:    pCO2 arterial 29.8 (*)    pO2, Arterial 147.0 (*)

## 2014-10-23 NOTE — ED Notes (Signed)
Ivs infusing in the rt a-c and the central line

## 2014-10-23 NOTE — ED Notes (Signed)
The pt was found in asystole after huffing comopressed air he just  Got out of behavoiriual.  2 attempots already this month to kill hiumself.  Last seen at 2330.  He has had 7 epin narcan 2mg .  2 more eopine on the way here.   No cpr when he arrived.  King airway.    Io lt tibia.

## 2014-10-23 NOTE — Code Documentation (Signed)
#  16 angioicath lt a-c  oper james rn

## 2014-10-23 NOTE — Code Documentation (Signed)
og placed by SunGardjames rn.  The opt had vomited prioir to his arrival here

## 2014-10-23 NOTE — Code Documentation (Signed)
Atropine bristojet iv  Karle BarrJames rn

## 2014-10-23 NOTE — ED Notes (Signed)
The pt is now making ujrine

## 2014-10-23 NOTE — Progress Notes (Signed)
PULMONARY / CRITICAL CARE MEDICINE HISTORY AND PHYSICAL EXAMINATION   Name: Martin Singh MRN: [id number] DOB: Mar 02, 1966    ADMISSION DATE:  10/28/14  PRIMARY SERVICE: PCCM  CHIEF COMPLAINT:  Cardiac Arrest  BRIEF PATIENT DESCRIPTION: 49 y/o M w/ hx of recurrent suicide attempts presenting w/ cardiac arrest after huffing contents of compressed "duster" can.     SIGNIFICANT EVENTS / STUDIES:  2/27  Admit after being found down for unknown time, CPR in field, CPR x2 in ED.   2/27  4th cardiac arrest ~ 0700, PEA   SUBJECTIVE: RN reports 4th cardiac arrest this am - PEA.  ROSC with restart of epi gtt.    VITAL SIGNS: Temp:  [90.9 F (32.7 C)-92.3 F (33.5 C)] 91.7 F (33.2 C) (02/27 0500) Pulse Rate:  [39-135] 80 (02/27 0500) Resp:  [11-38] 11 (02/27 0500) BP: (50-199)/(34-131) 193/131 mmHg (02/27 0500) SpO2:  [0 %-100 %] 91 % (02/27 0500) Arterial Line BP: (202)/(120) 202/120 mmHg (02/27 0400) FiO2 (%):  [40 %-100 %] 60 % (02/27 0458) Weight:  [188 lb 7.9 oz (85.5 kg)] 188 lb 7.9 oz (85.5 kg) (02/27 0400)   HEMODYNAMICS:     VENTILATOR SETTINGS: Vent Mode:  [-] PRVC FiO2 (%):  [40 %-100 %] 60 % Set Rate:  [14 bmp-30 bmp] 30 bmp Vt Set:  [450 mL-500 mL] 450 mL PEEP:  [5 cmH20] 5 cmH20 Plateau Pressure:  [26 cmH20] 26 cmH20   INTAKE / OUTPUT: Intake/Output      02/26 0701 - 02/27 0700 02/27 0701 - 02/28 0700   I.V. (mL/kg) 2000 (23.4)    Total Intake(mL/kg) 2000 (23.4)    Urine (mL/kg/hr) 625    Total Output 625     Net +1375            PHYSICAL EXAMINATION: General:  Critically ill, intubated, mottled appearance Neuro:  Pupils are fixed and dilated, no corneal reflexes, no response to noxious stimulus. No spontaneous movement noted.  Not breathing over set rate HEENT:  OETT, mm moist Neck: No JVD Cardiovascular:  Cold, poor peripheral pulses, s1s2 rrr, no m/r/g Lungs:  Even/non-labored, lungs bilaterally coarse rhonchi Abdomen:  Distended, decreased bowel  sounds Musculoskeletal:  No acute deformities Skin:  Mottled, cool  LABS:  CBC  Recent Labs Lab Oct 28, 2014 0500  WBC 10.1  HGB 15.2  HCT 46.8  PLT 447*   Coag's  Recent Labs Lab October 28, 2014 0123  APTT 37  INR 1.29   BMET  Recent Labs Lab 2014-10-28 0123 10/28/2014 0500  NA 132* 133*  K 4.9 4.6  CL 97 100  CO2 21 24  BUN 10 13  CREATININE 1.51* 1.42*  GLUCOSE 255* 228*   Electrolytes  Recent Labs Lab October 28, 2014 0123 10/28/2014 0500  CALCIUM 8.7 7.7*  MG  --  2.1  PHOS  --  5.9*   Sepsis Markers  Recent Labs Lab October 28, 2014 0135  LATICACIDVEN 8.22*   ABG  Recent Labs Lab 10-28-2014 0254 10/28/2014 0500 10-28-2014 0726  PHART 7.228* 7.391 7.152*  PCO2ART 54.8* 29.8* 54.1*  PO2ART 456.0* 147.0* 51.0*   Liver Enzymes  Recent Labs Lab 10/28/2014 0123  AST 93*  ALT 95*  ALKPHOS 86  BILITOT 0.4  ALBUMIN 3.9   Cardiac Enzymes  Recent Labs Lab 2014-10-28 0123  TROPONINI <0.03   Glucose  Recent Labs Lab Oct 28, 2014 0257 10/28/14 0708  GLUCAP 179* 186*    Imaging Dg Chest Port 1 View  October 28, 2014   CLINICAL DATA:  Attempted  suicide. Endotracheal tube placement. Currently coding.  EXAM: PORTABLE CHEST - 1 VIEW  COMPARISON:  09/26/2014  FINDINGS: Endotracheal tube tip measures 1.4 cm above the carina. Enteric tube tip is below the left hemidiaphragm with proximal side hole above the left hemidiaphragm. Tip is likely in the upper stomach and proximal side hole in the distal esophagus. Shallow inspiration with linear atelectasis in the right lung base. Normal heart size and pulmonary vascularity for technique. No pneumothorax.  IMPRESSION: Endotracheal to tip measures 1.4 cm above the carina. Enteric tube tip is in the region of the upper stomach with distal side hole probably in the lower esophagus.   Electronically Signed   By: Burman NievesWilliam  Stevens M.D.   On: 06/07/15 02:00    ASSESSMENT / PLAN:  Active Problems:   Cardiac arrest   PULMONARY OETT 2/27 >>   A: Acute Respiratory Failure - in setting of cardiac arrest with huffing episode  Presumed Aspiration  Severe ARDS  Respiratory Acidosis  P:   PRVC, lung protective ventilation ARDS protocol  Trend ABG, CXR PRN albuterol    CARDIOVASCULAR R Fem CVC 2/27 >>  R Fem Art Line 2/27 >>  A: S/p Cardiac Arrest - arrest x 4 (including admit) with prior unknown downtime.  Likely due to arrhythmia/hypoxia after huffing volatile organic propellant.   Cardiogenic Shock HTN P:   Not a candidate for cooling with primary rhythm of asystole, recurrent cardiac arrest, and current neurologic exam suggesting total devastation. Continue dopamine, epi gtt for MAP >65 Assess ECHO STAT methemoglobin, lactate, repeat code labs  RENAL A: AKI Metabolic Acidosis  Hyponatremia (chonic) P:   NS at 125 ml/hr Trend BMP  Replace electrolytes as indicated   GASTROINTESTINAL A: Transaminitis - likely in setting of shock Fatty Liver Obesity P:   SUP: Protonix  NPO OGT to LIS  Monitor lab trend  HEMATOLOGIC A: No active issues P:   Monitor CBC DVT: Heparin SQ  INFECTIOUS A: Aspiration / ARDS P:   Unasyn, start date 2/27, D1/x  ENDOCRINE A: Hyperglycemia P:   ICU hyperglycemia protocol   NEUROLOGIC UDS 2/27 >> neg  ETOH 2/27 >> neg  A: Acute Encephalopathy - likely ischemic/hypoxic brain injury in the setting of huffing with cardiac arrest  Depression / Anxiety  Prior Suicide Attempts  P:   Serial neuro exams No sedation for now  Neuro consult for prognostication, likely very poor prognosis CT head    GOC:  Extensive discussion with fiancee (Amy) and Father Greggory Stallion(George) regarding patients current state.  His clinical exam is concerning that he will not recover from the events of the last 24 hours.  He has had multiple cardiac arrests and an unknown down time prior to presentation.  Despite aggressive support, he has continued to have clinical decline.  I recommended that no  further CPR be performed in the event of cardiac arrest.  The father is agreeable but wants to talk to the patients brothers and sisters as well before a decision is made.  Continue full support for now.  Concerned with family's response to discussion - not sure they understand the gravity of his situation.    Canary BrimBrandi Ollis, NP-C [place] [facility] Pgr: [phone] or [phone]    2015/01/04, 7:50 AM  Reviewed above, and examined.  He likely has devastating neuro injury in setting of cardiac arrest after huffing.    Pupils fixed/dilated, no corneal reflex, comatose.  B/l rales.    Continue vent and hemodynamic support  for now.  Get CT head and consult neuro >> depending on findings will likely then need to d/w family about goals of care.  CC time by me independent of APP time is 35 minutes.  Coralyn Helling, MD [facility] Pulmonary/Critical Care 10/04/2014, 11:54 AM Pager:  [phone] After 3pm call: [phone]

## 2014-10-23 NOTE — Code Documentation (Signed)
Dopamine drip increased to 7810mcg/kg/min.  15ml james rn

## 2014-10-23 NOTE — Code Documentation (Signed)
Intubated with 8.0 tube by dr Norlene Campbellotter.

## 2014-10-23 NOTE — ED Notes (Signed)
Epi drip disc/d per md

## 2014-10-23 NOTE — ED Notes (Signed)
Report given to rn on 2100 

## 2014-10-23 NOTE — Progress Notes (Signed)
Spoke with pt's son and grandfather.  [place]  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  Explained that he will not be able to recovery neurologic status.  They understand this, and have agreed to DNR status in the event of cardiac arrest.  They would like to maintain ventilator/pressors until his father can arrive to [place] on 2/28, and then discuss withdrawal of ventilator support.    Coralyn HellingVineet Kue Fox, MD [facility] Feb 19, 2015, 6:26 PM Pager:  [phone] After 3pm call: [phone]

## 2014-10-23 NOTE — Consult Note (Signed)
 Reason for Consult:Unresponsive Referring Physician: Craige Cotta  CC: Unresponsive  HPI: Martin Singh is an 49 y.o. male s/p multiple suicide attempts who presented after being found down by his fiance.  He apparently had inhaled the contents of a compressed dusting canister.  Has a history of huffing in the past.  Unclear amount of time down.  On EMS arrival was in asystole.  CPR was initiated and the patient developed PEA and eventually ROSC.  He was brought to the ED.  Since arrival at [facility] he has developed PEA three additional times with ROSC being regained each time.   Despite regaining ROSC he has remained unresponsive.     Past Medical History  Diagnosis Date  . Hypertension   . Anxiety and depression   . Osteoarthrosis, unspecified whether generalized or localized, unspecified site   . Irritable bowel syndrome   . GERD (gastroesophageal reflux disease)     h/o esophagitis  . Allergic rhinitis   . Insomnia   . Diverticulosis   . Alcohol abuse 2012  . Polysubstance abuse   . Seizures   . Alcoholic pancreatitis   . Periumbilical hernia 12/13/12  . Fatty liver 10/08/11  . Internal hemorrhoids 03/13/11  . Hiatal hernia 03/13/11  . Asthmatic bronchitis   . Frequent headaches   . 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 Prediabetes 03/01/2014  . Depression   . Anxiety     Past Surgical History  Procedure Laterality Date  . Tibula surgery Right 2002    Family History  Problem Relation Age of Onset  . Heart disease Mother   . Gallbladder disease Mother   . Nephrolithiasis Mother   . Arthritis Mother   . Hyperlipidemia Mother   . Stroke Mother   . Irritable bowel syndrome Mother   . Cancer Mother     vulva  . Colon cancer Neg Hx   . Hypertension Father     moth  . Heart disease Father   . Arthritis Father   . Irritable bowel syndrome Brother     x 2    Social History:  reports that he quit smoking about 23 years ago. His smokeless tobacco use includes Snuff. He reports that  he does not drink alcohol or use illicit drugs.  Allergies  Allergen Reactions  . Codeine Anaphylaxis  . Benazepril Cough  . Ativan [Lorazepam] Other (See Comments)    Becomes violent and hallucinates  . Corticosteroids Other (See Comments)    Shaky and high blood pressure  " I can't have too high of a steroid"    Medications:  I have reviewed the patient's current medications. Prior to Admission:  Prescriptions prior to admission  Medication Sig Dispense Refill Last Dose  . albuterol (PROVENTIL HFA;VENTOLIN HFA) 108 (90 BASE) MCG/ACT inhaler Inhale 2 puffs into the lungs every 6 (six) hours as needed for wheezing or shortness of breath. 3 Inhaler 1   . benztropine (COGENTIN) 0.5 MG tablet Take 1 tablet (0.5 mg total) by mouth 2 (two) times daily in the am and at bedtime.. For prevention of drug induced tremors 60 tablet 0   . budesonide-formoterol (SYMBICORT) 160-4.5 MCG/ACT inhaler Inhale 2 puffs into the lungs 2 (two) times daily. For asthma 3 Inhaler 1   . carbamazepine (TEGRETOL XR) 400 MG 12 hr tablet Take 1 tablet (400 mg total) by mouth 2 (two) times daily. For mood stabilization 60 tablet 0   . carvedilol (COREG) 12.5 MG tablet Take 1 tablet (12.5 mg total) by  mouth 2 (two) times daily with a meal. For high blood pressure 60 tablet 0   . cloNIDine (CATAPRES) 0.1 MG tablet Take 1 tablet (0.1 mg total) by mouth daily before breakfast. For high blood pressure 30 tablet 0   . clotrimazole (LOTRIMIN) 1 % cream Apply topically 2 (two) times daily. For fungal rash 30 g 0   . esomeprazole (NEXIUM) 40 MG capsule Take 1 capsule (40 mg total) by mouth daily at 12 noon. For acid reflux 60 capsule 3   . fesoterodine (TOVIAZ) 4 MG TB24 tablet Take 1 tablet (4 mg total) by mouth daily. For urinary problems 30 tablet    . gabapentin (NEURONTIN) 300 MG capsule Take 1 capsule (300 mg total) by mouth 4 (four) times daily -  with meals and at bedtime. For agitation 120 capsule 0   .  hydrochlorothiazide (HYDRODIURIL) 25 MG tablet Take 1 tablet (25 mg total) by mouth daily. For high blood pressure 30 tablet 3   . ipratropium (ATROVENT) 0.03 % nasal spray Two sprays each nostril 2-3 times daily prn: For runny nose 30 mL 5   . Multiple Vitamin (MULTIVITAMIN WITH MINERALS) TABS tablet Take 1 tablet by mouth daily. For low vitamin     . risperiDONE (RISPERDAL M-TABS) 1 MG disintegrating tablet Take 1 tablet (1 mg total) by mouth 2 (two) times daily in the am and at bedtime.. For mood control 60 tablet 0   . sertraline (ZOLOFT) 50 MG tablet Take 1 tablet (50 mg total) by mouth daily. For depression 30 tablet 0   . topiramate (TOPAMAX) 25 MG tablet Take 1 tablet (25 mg total) by mouth daily. Migraine headaches 120 tablet 3   . traZODone (DESYREL) 50 MG tablet Take 1 tablet (50 mg total) by mouth at bedtime as needed for sleep. 30 tablet 0    Scheduled: . ampicillin-sulbactam (UNASYN) IV  3 g Intravenous 3 times per day  . antiseptic oral rinse  7 mL Mouth Rinse QID  . aspirin  300 mg Rectal NOW  . chlorhexidine  15 mL Mouth Rinse BID  . heparin  5,000 Units Subcutaneous 3 times per day  . insulin aspart  2-6 Units Subcutaneous 6 times per day  . pantoprazole (PROTONIX) IV  40 mg Intravenous Q24H    ROS: Unable to obtain  Physical Examination: Blood pressure 101/66, pulse 74, temperature 93 F (33.9 C), temperature source Core (Comment), resp. rate 30, weight 85.5 kg (188 lb 7.9 oz), SpO2 95 %.  HEENT-  Normocephalic, no lesions, without obvious abnormality.  Normal external eye and conjunctiva.  Normal TM's bilaterally.  Normal auditory canals and external ears. Normal external nose, mucus membranes and septum.  Normal pharynx. Cardiovascular- S1, S2 normal, pulses palpable throughout   Lungs- chest clear, no wheezing, rales, normal symmetric air entry Abdomen- soft, non-tender; bowel sounds normal; no masses,  no organomegaly, distended Extremities- no edema Lymph-no  adenopathy palpable Musculoskeletal-no joint tenderness, deformity or swelling Skin-brusing at left knee, bug bites on shins bilaterally  Neurological Examination Mental Status: Patient does not respond to verbal stimuli.  Does not respond to deep sternal rub.  Does not follow commands.  No verbalizations noted.  Cranial Nerves: II: patient does not respond confrontation bilaterally, pupils right 5 mm, left 5 mm,and unreactive bilaterally III,IV,VI: doll's response absent bilaterally.  V,VII: corneal reflex absent bilaterally  VIII: patient does not respond to verbal stimuli IX,X: gag reflex absent, XI: trapezius strength unable to test bilaterally XII: tongue strength  unable to test Motor: Extremities flaccid throughout.  No spontaneous movement noted.  No purposeful movements noted. Sensory: Does not respond to noxious stimuli in any extremity. Deep Tendon Reflexes:  Absent throughout. Plantars: mute bilaterally Cerebellar: Unable to perform   Laboratory Studies:   Basic Metabolic Panel:  Recent Labs Lab 10/14/2014 0123 10/11/2014 0500 09/28/2014 0641  NA 132* 133* 137  K 4.9 4.6 3.4*  CL 97 100 103  CO2 21 24 22   GLUCOSE 255* 228* 226*  BUN 10 13 14   CREATININE 1.51* 1.42* 1.61*  CALCIUM 8.7 7.7* 7.1*  MG  --  2.1  --   PHOS  --  5.9*  --     Liver Function Tests:  Recent Labs Lab 09/30/2014 0123  AST 93*  ALT 95*  ALKPHOS 86  BILITOT 0.4  PROT 6.6  ALBUMIN 3.9    Recent Labs Lab 10/12/2014 0123  LIPASE 34   No results for input(s): AMMONIA in the last 168 hours.  CBC:  Recent Labs Lab 10/16/2014 0500  0641  WBC 10.1 12.0*  HGB 15.2 13.9  HCT 46.8 42.9  MCV 86.5 87.2  PLT 447* 397    Cardiac Enzymes:  Recent Labs Lab 10/01/2014 0123 10/03/2014 0641  TROPONINI <0.03 0.70*    BNP: Invalid input(s): POCBNP  CBG:  Recent Labs Lab 10/11/2014 0257 10/15/2014 0708  GLUCAP 179* 186*    Microbiology: Results for orders placed or  performed during the hospital encounter of   MRSA PCR Screening     Status: None   Collection Time: 10/04/2014  5:57 AM  Result Value Ref Range Status   MRSA by PCR NEGATIVE NEGATIVE Final    Comment:        The GeneXpert MRSA Assay (FDA approved for NASAL specimens only), is one component of a comprehensive MRSA colonization surveillance program. It is not intended to diagnose MRSA infection nor to guide or monitor treatment for MRSA infections.     Coagulation Studies:  Recent Labs  10/20/2014 0123  LABPROT 16.2*  INR 1.29    Urinalysis:  Recent Labs Lab 09/27/2014 0339  COLORURINE YELLOW  LABSPEC 1.009  PHURINE 7.5  GLUCOSEU 250*  HGBUR MODERATE*  BILIRUBINUR NEGATIVE  KETONESUR NEGATIVE  PROTEINUR >300*  UROBILINOGEN 0.2  NITRITE NEGATIVE  LEUKOCYTESUR NEGATIVE    Lipid Panel:     Component Value Date/Time   CHOL 256* 04/02/2014 1056   TRIG 186.0* 04/02/2014 1056   HDL 60.50 04/02/2014 1056   CHOLHDL 4 04/02/2014 1056   VLDL 37.2 04/02/2014 1056   LDLCALC 158* 04/02/2014 1056    HgbA1C:  Lab Results  Component Value Date   HGBA1C 5.9 04/02/2014    Urine Drug Screen:     Component Value Date/Time   LABOPIA NONE DETECTED 09/30/2014 0339   LABOPIA NEG 04/02/2013 0959   COCAINSCRNUR NONE DETECTED 10/16/2014 0339   COCAINSCRNUR NEG 04/02/2013 0959   LABBENZ NONE DETECTED 10/01/2014 0339   LABBENZ NEG 04/02/2013 0959   AMPHETMU NONE DETECTED 10/08/2014 0339   AMPHETMU NEG 04/02/2013 0959   THCU NONE DETECTED 09/30/2014 0339   LABBARB NONE DETECTED 10/04/2014 0339    Alcohol Level:  Recent Labs Lab 10/16/2014 0123  ETH <5    Other results: EKG: sinus rhythm at 93 bpm.  Imaging: Dg Chest Port 1 View  10/01/2014   CLINICAL DATA:  Hypoxia.  EXAM: PORTABLE CHEST - 1 VIEW  COMPARISON:  Same day.  FINDINGS: Endotracheal tube is in grossly good position  with distal tip 3.6 cm above the carina. Nasogastric tube is seen with tip in proximal  stomach. Stable cardiomediastinal silhouette. Increased multifocal airspace opacities are noted throughout both lungs concerning for pneumonia or edema. No pneumothorax or significant pleural effusion is noted. Bony thorax is intact.  IMPRESSION: Endotracheal tube is in grossly good position. Distal tip of nasogastric tube is seen in proximal stomach. Interval development of multifocal airspace opacities throughout both lungs is noted consistent with pneumonia or edema.   Electronically Signed   By: Lupita Raider, M.D.   On: 09/27/2014 08:12   Dg Chest Port 1 View  10/03/2014   CLINICAL DATA:  Attempted suicide. Endotracheal tube placement. Currently coding.  EXAM: PORTABLE CHEST - 1 VIEW  COMPARISON:  09/26/2014  FINDINGS: Endotracheal tube tip measures 1.4 cm above the carina. Enteric tube tip is below the left hemidiaphragm with proximal side hole above the left hemidiaphragm. Tip is likely in the upper stomach and proximal side hole in the distal esophagus. Shallow inspiration with linear atelectasis in the right lung base. Normal heart size and pulmonary vascularity for technique. No pneumothorax.  IMPRESSION: Endotracheal to tip measures 1.4 cm above the carina. Enteric tube tip is in the region of the upper stomach with distal side hole probably in the lower esophagus.   Electronically Signed   By: Burman Nieves M.D.   On: 10/02/2014 02:00     Assessment/Plan: 49 year old male found down, s/p multiple arrests since initial resuscitation.  Now remains unresponsive.  Neurological examination consistent with brain death.  Pupils fixed and dilated.  No hypothermia initiated.  Patient on no sedation.  Suspect significant anoxic brain injury.  Prognosis is poor for functional recovery.    Recommendations: 1.  EEG 2.  Head CT without contrast  Case discussed with Dr. Suzzanne Cloud, MD [facility] [phone] 10/02/2014, 11:29 AM

## 2014-10-23 NOTE — Progress Notes (Signed)
Critical Lab  Potassium 2.6  Dr. Curt BearsBelanger notified in black box.  Zenovia JordanBridget Laterrica Libman, RN

## 2014-10-23 NOTE — Progress Notes (Signed)
Father - Eligah EastGeorge Leatham.  Home number [phone].  Cell [phone].    Father & son are next of kin - apparent difficult relationship / family dynamics.  Patient does have a fiancee as well (Amy).     Canary BrimBrandi Dhamar Gregory, NP-C [place] [facility] Pgr: [phone] or [phone]

## 2014-10-23 NOTE — Progress Notes (Signed)
1 amp bicarb given 0730  1 amp bicarb given 0800  Verbal orders per Joanell Risingrimbal, MD

## 2014-10-24 ENCOUNTER — Inpatient Hospital Stay (HOSPITAL_COMMUNITY): Payer: Medicaid Other

## 2014-10-24 LAB — URINE CULTURE
Colony Count: NO GROWTH
Culture: NO GROWTH

## 2014-10-24 MED ORDER — SODIUM CHLORIDE 0.9 % IV SOLN
25.0000 ug/h | INTRAVENOUS | Status: DC
  Administered 2014-10-24: 100 ug/h via INTRAVENOUS
  Filled 2014-10-24: qty 50

## 2014-10-24 MED ORDER — POTASSIUM CHLORIDE 10 MEQ/50ML IV SOLN: 10.0000 meq | INTRAVENOUS | Status: DC

## 2014-10-24 MED ORDER — LORAZEPAM 2 MG/ML IJ SOLN
1.0000 mg/h | INTRAMUSCULAR | Status: DC
  Filled 2014-10-24: qty 25

## 2014-10-24 MED ORDER — ATROPINE SULFATE 1 % OP SOLN
4.0000 [drp] | OPHTHALMIC | Status: DC | PRN
  Filled 2014-10-24: qty 2

## 2014-10-24 MED ORDER — ACETAMINOPHEN 650 MG RE SUPP: 650.0000 mg | RECTAL | Status: DC | PRN

## 2014-10-24 MED ORDER — POTASSIUM CHLORIDE 20 MEQ/15ML (10%) PO SOLN
40.0000 meq | Freq: Once | ORAL | Status: DC
  Filled 2014-10-24: qty 30

## 2014-10-24 MED ORDER — FENTANYL BOLUS VIA INFUSION
50.0000 ug | INTRAVENOUS | Status: DC | PRN
  Administered 2014-10-24: 100 ug via INTRAVENOUS
  Filled 2014-10-24: qty 200

## 2014-10-24 MED ORDER — LORAZEPAM BOLUS VIA INFUSION
2.0000 mg | INTRAVENOUS | Status: DC | PRN
  Filled 2014-10-24: qty 5

## 2014-10-24 MED FILL — Medication: Qty: 1 | Status: AC

## 2014-10-24 NOTE — Progress Notes (Signed)
Ativan drip discontinued. Medication returned to main pharmacy by Stefani DamaKristina Brodric Schauer, RN.

## 2014-10-24 NOTE — Progress Notes (Signed)
UR Completed.  [phone]  

## 2014-10-24 NOTE — Procedures (Signed)
 Extubation Procedure Note  Patient Details:   Name: Martin Singh DOB: Aug 23, 1966 MRN: [id number]   Airway Documentation:   Patient terminally extubated following family arrival.   Evaluation  O2 sats: transiently fell during during procedure Complications: No apparent complications Patient did tolerate procedure well. Bilateral Breath Sounds: Clear, Diminished Suctioning: Oral, Airway No  [name], [name] , 11:31 PM

## 2014-10-24 NOTE — Progress Notes (Signed)
Patient has been assessed. No breath sounds or heart sounds auscultated. Absent pulses. Pupils are fixed and dilated. Family at bedside. Time of death 2211. Dr. Sung AmabileSimonds notified at 2225. [employer] chosen by Eligah EastGeorge Brune, the patient's father. ME Perkins notified. Patient will be a ME case with no autopsy. Leaving remaining lines in.

## 2014-10-24 NOTE — Progress Notes (Deleted)
 [facility] RE[facility]OTOCOL FOR AM LAB REPLACEMENT ONLY  The patient does apply for the [facility] Electrolyte Re[facility] Protocol based on the criteria listed below:   1. Is GFR >/= 40 ml/min? Yes.    Patient's GFR today is 57 2. Is urine output >/= 0.5 ml/kg/hr for the last 6 hours? Yes.   Patient's UOP is 3.8 ml/kg/hr 3. Is BUN < 60 mg/dL? Yes.    Patient's BUN today is 17 4. Abnormal electrolyte(s): K 2.6 5. Ordered repletion with: per protocol 6. If a panic level lab has been reported, has the CCM MD in charge been notified? Yes.  .   Physician:  Dr Darrick Pennadeterding   Barnetta ChapelWHELAN, Tomi Grandpre A  5:56 AM

## 2014-10-24 NOTE — Progress Notes (Signed)
 Met with pt's family Paediatric nurse, father, son).  Explained to them pt's current status and that he has irreversible neurologic injury.  They are in agreement that he would not want to be sustained on life support w/o hope for improvement.  DNR status confirmed.  Will proceed with withdrawal of ventilatory support and transition to comfort measures.  Process for removing ventilator d/w them.  Also discussed that time course for dying process is difficulty to predict.  Chesley Mires, MD [place] , 8:15 PM Pager:  [phone] After 3pm call: [phone]

## 2014-10-24 NOTE — Progress Notes (Signed)
 As Mr. Leonides SakeCross's outpatient neurologist, I was asked by his fiance, Dewayne Hatchnn to perform my own examination of [name] as a second opinion.  History reviewed via review of chart and speaking with his covering nurse.  EEG reportedly without any background activity, even at sensitivity of 5uV/mm, and unchanged with noxious stimulation.  Filed Vitals:    1224  BP: 108/52  Pulse: 83  Temp:   Resp: 30  Patient is intubated, unresponsive.  He is not on sedation.  Pupils are fixed and dilated.  Oculocephalic reflex absent.  Corneal reflex absent.  Gag reflex absent.  He is not overbreathing the vent.  Patient postures to noxious stimuli but exhibits no purposeful movement.  Impression: Hypoxic brain injury status post cardiac arrest.  EEG findings and exam consisting of absent brainstem reflexes suggests poor prognosis.  I called and left a message for Dewayne Hatchnn to call back regarding my assessment.  Adam R. Everlena CooperJaffe, DO

## 2014-10-24 NOTE — Progress Notes (Signed)
 PULMONARY / CRITICAL CARE MEDICINE HISTORY AND PHYSICAL EXAMINATION   Name: CARLOSALBERTO YACKO MRN: [id number] DOB: 1966-06-25    ADMISSION DATE:    PRIMARY SERVICE: PCCM  CHIEF COMPLAINT:  Cardiac Arrest  BRIEF PATIENT DESCRIPTION: 49 y/o M w/ hx of recurrent suicide attempts presenting w/ cardiac arrest after huffing contents of compressed "duster" can.    SIGNIFICANT EVENTS / STUDIES:  2/27  Admit after being found down for unknown time, CPR in field, CPR x2 in ED.   2/27  4th cardiac arrest ~ 0700, PEA 2/27  Diffuse cerebral & cerebellar edema, diffuse effacement of cisterns/sulci 2/27  DNR established after CT   SUBJECTIVE: RN reports pt made DNR last PM.  No significant change in epi/dopa gtt's.  No change in exam.     VITAL SIGNS: Temp:  [93 F (33.9 C)-95.9 F (35.5 C)] 95.9 F (35.5 C) (02/28 0900) Pulse Rate:  [64-74] 74 (02/28 0900) Resp:  [20-30] 30 (02/28 0900) BP: (96-157)/(53-101) 96/64 mmHg (02/28 0900) SpO2:  [98 %-100 %] 100 % (02/28 0900) Arterial Line BP: (108-161)/(51-85) 119/55 mmHg (02/28 0900) FiO2 (%):  [70 %-90 %] 70 % (02/28 0758)   HEMODYNAMICS:     VENTILATOR SETTINGS: Vent Mode:  [-] PRVC FiO2 (%):  [70 %-90 %] 70 % Set Rate:  [30 bmp] 30 bmp Vt Set:  [400 mL] 400 mL PEEP:  [10 cmH20-12 cmH20] 10 cmH20 Plateau Pressure:  [25 cmH20-27 cmH20] 26 cmH20   INTAKE / OUTPUT: Intake/Output      02/27 0701 - 02/28 0700 02/28 0701 - 02/29 0700   I.V. (mL/kg) 4147.6 (48.5) 332.3 (3.9)   IV Piggyback 600    Total Intake(mL/kg) 4747.6 (55.5) 332.3 (3.9)   Urine (mL/kg/hr) 4325 (2.1) 700 (2.9)   Emesis/NG output 900 (0.4)    Total Output 5225 700   Net -477.4 -367.7          PHYSICAL EXAMINATION: General:  Critically ill, intubated Neuro:  Pupils are fixed and dilated, no corneal reflexes, no response to noxious stimulus. No spontaneous movement noted.  Not breathing over set rate HEENT:  OETT, mm moist Neck: No JVD Cardiovascular:   Cold, poor peripheral pulses, s1s2 rrr, no m/r/g Lungs:  Even/non-labored, lungs bilaterally coarse rhonchi, few crackles lower lateral  Abdomen:  Distended, decreased bowel sounds Musculoskeletal:  No acute deformities Skin:  Mottled, cool  LABS:  CBC  Recent Labs Lab 10/04/2014 0500 10/13/2014 0641  WBC 10.1 12.0*  HGB 15.2 13.9  HCT 46.8 42.9  PLT 447* 397   Coag's  Recent Labs Lab 09/28/2014 0123  APTT 37  INR 1.29   BMET  Recent Labs Lab 10/11/2014 0500 10/07/2014 0641 09/29/2014 1300  NA 133* 137 141  K 4.6 3.4* 2.6*  CL 100 103 108  CO2 24 22 26   BUN 13 14 17   CREATININE 1.42* 1.61* 1.42*  GLUCOSE 228* 226* 166*   Electrolytes  Recent Labs Lab 10/06/2014 0500 10/05/2014 0641 09/27/2014 1300  CALCIUM 7.7* 7.1* 7.6*  MG 2.1  --   --   PHOS 5.9*  --   --    Sepsis Markers  Recent Labs Lab 10/15/2014 0113 09/27/2014 0135  LATICACIDVEN 3.5* 8.22*   ABG  Recent Labs Lab 10/18/2014 0500 10/03/2014 0726 09/28/2014 0949  PHART 7.391 7.152* 7.251*  PCO2ART 29.8* 54.1* 48.6*  PO2ART 147.0* 51.0* 64.0*   Liver Enzymes  Recent Labs Lab 10/11/2014 0123  AST 93*  ALT 95*  ALKPHOS 86  BILITOT  0.4  ALBUMIN 3.9   Cardiac Enzymes  Recent Labs Lab 10/05/2014 0123 09/29/2014 0641  TROPONINI <0.03 0.70*   Glucose  Recent Labs Lab 10/20/2014 0257 10/08/2014 0708 10/21/2014 1059 10/16/2014 1523 10/15/2014 1954  GLUCAP 179* 186* 142* 150* 114*    Imaging Dg Chest Port 1 View  10/06/2014   CLINICAL DATA:  Hypoxia.  EXAM: PORTABLE CHEST - 1 VIEW  COMPARISON:  Same day.  FINDINGS: Endotracheal tube is in grossly good position with distal tip 3.6 cm above the carina. Nasogastric tube is seen with tip in proximal stomach. Stable cardiomediastinal silhouette. Increased multifocal airspace opacities are noted throughout both lungs concerning for pneumonia or edema. No pneumothorax or significant pleural effusion is noted. Bony thorax is intact.  IMPRESSION: Endotracheal tube is in  grossly good position. Distal tip of nasogastric tube is seen in proximal stomach. Interval development of multifocal airspace opacities throughout both lungs is noted consistent with pneumonia or edema.   Electronically Signed   By: Lupita Raider, M.D.   On: 10/07/2014 08:12   Dg Chest Port 1 View  09/28/2014   CLINICAL DATA:  Attempted suicide. Endotracheal tube placement. Currently coding.  EXAM: PORTABLE CHEST - 1 VIEW  COMPARISON:  09/26/2014  FINDINGS: Endotracheal tube tip measures 1.4 cm above the carina. Enteric tube tip is below the left hemidiaphragm with proximal side hole above the left hemidiaphragm. Tip is likely in the upper stomach and proximal side hole in the distal esophagus. Shallow inspiration with linear atelectasis in the right lung base. Normal heart size and pulmonary vascularity for technique. No pneumothorax.  IMPRESSION: Endotracheal to tip measures 1.4 cm above the carina. Enteric tube tip is in the region of the upper stomach with distal side hole probably in the lower esophagus.   Electronically Signed   By: Burman Nieves M.D.   On: 10/18/2014 02:00   Ct Portable Head W/o Cm  10/19/2014   CLINICAL DATA:  Status post cardiac arrest.  Anoxic encephalopathy.  EXAM: CT HEAD WITHOUT CONTRAST  TECHNIQUE: Contiguous axial images were obtained from the base of the skull through the vertex without intravenous contrast.  COMPARISON:  CT of the had on 08/23/2014  FINDINGS: There is diffuse cerebral and cerebellar edema. There is effacement of the cisterns and sulci. Lateral, third, and fourth ventricles are also effaced. No evidence for hemorrhage. Appearance of "Pseudo subarachnoid hemorrhage" likely artifactual due to marked edema.  Bone windows show air-fluid levels within the paranasal sinuses. Left mastoid effusion. No calvarial fracture.  Because of patient body habitus and ventilation tubing, patient's skull base could not be imaged.  IMPRESSION: 1. Marked cerebral and  cerebellar edema. 2. Diffuse effacement of cisterns and sulci. 3. Critical Value/emergent results were called by telephone at the time of interpretation on 10/08/2014 at 2:49 pm to Dr. Coralyn Helling , who verbally acknowledged these results.   Electronically Signed   By: Norva Pavlov M.D.   On: 10/15/2014 14:58    ASSESSMENT / PLAN:  Active Problems:   Cardiac arrest   Anoxic encephalopathy   PULMONARY OETT 2/27 >>  A: Acute Respiratory Failure - in setting of cardiac arrest with huffing episode  Presumed Aspiration  Severe ARDS  Respiratory Acidosis  P:   PRVC, lung protective ventilation ARDS protocol  Trend ABG, CXR PRN albuterol  Family likely to withdrawal care this evening once father arrives  CARDIOVASCULAR R Fem CVC 2/27 >>  R Fem Art Line 2/27 >>  A: S/p Cardiac  Arrest - arrest x 4 (including admit) with prior unknown downtime.  Likely due to arrhythmia/hypoxia after huffing volatile organic propellant.   Cardiogenic Shock HTN P:   Not a candidate for cooling with primary rhythm of asystole, recurrent cardiac arrest, and neurologic exam suggesting total devastation. DNR Continue dopamine, epi gtt for MAP >65  RENAL A: AKI Metabolic Acidosis  Hyponatremia (chonic) Hypokalemia  P:   NS at 75 ml/hr Hold further labs  GASTROINTESTINAL A: Transaminitis - likely in setting of shock Fatty Liver Obesity P:   SUP: Protonix  NPO OGT to LIS  Monitor lab trend  HEMATOLOGIC A: No active issues P:   Hold further labs DVT: Heparin SQ  INFECTIOUS A: Aspiration / ARDS P:   Unasyn, start date 2/27, D2/x  ENDOCRINE A: Hyperglycemia P:   ICU hyperglycemia protocol   NEUROLOGIC UDS 2/27 >> neg  ETOH 2/27 >> neg  A: Acute Encephalopathy - likely ischemic/hypoxic brain injury in the setting of huffing with cardiac arrest.  Diffuse cerebral / cerebellar edema on CT  Depression / Anxiety  Prior Suicide Attempts  P:   Serial neuro exams No sedation  for now  Neuro consult, appreciate input    GOC:  Note discussion from 2/27 regarding goals of care.  Pt DNR.  Await fathers arrival to [facility], then consider withdrawal of care after discussion with family.     Canary Brim, NP-C [facility] Pgr: [phone] or [phone]   , 9:52 AM    Reviewed above, examined.  He is DNR and comatose.  Remainder of family to arrive this PM, and then discuss removal from ventilator.  Coralyn Helling, MD [facility] , 1:47 PM Pager:  [phone] After 3pm call: [phone]

## 2014-10-25 ENCOUNTER — Telehealth: Payer: Self-pay | Admitting: Emergency Medicine

## 2014-10-26 NOTE — Telephone Encounter (Signed)
RB is aware that pt has passed away.

## 2014-10-26 NOTE — Progress Notes (Signed)
Attempted to call Father, Martin Singh, as requested. Contact number did not work. I was able to contact the son, Martin Singh, and he was notified of his father's passing.

## 2014-10-26 NOTE — Progress Notes (Signed)
[facility] notified of time of death. Spoke with Jonetta OsgoodJamie Lane.

## 2014-10-26 NOTE — Progress Notes (Signed)
200 ml of Fentanyl gtt wasted in sink. Witnessed by Elisha HeadlandBrian Mack, RN.

## 2014-10-26 NOTE — Telephone Encounter (Signed)
Will let RB know of this when he comes in to the office today. Routing message to myself.

## 2014-10-26 DEATH — deceased

## 2014-10-28 NOTE — Discharge Summary (Signed)
 Martin Singh was a 49 y.o. admitted on 06-04-2015 altered mental status and respiratory failure after cardiac arrest.  Concern was for inhalation injury after huffing compressed duster can.  He had prolonged time of CPR before ROSC.  He required intubation and was started on pressors agents.  His neurologic exam was concern for profound anoxic brain injury.  He had CT head which showed changes consistent with anoxic encephalopathy, and EEG showed no cerebral activity.  He was made DNR.  Once remainder of his family arrived, decision was made to transition to comfort care.  He was removed from the ventilator and expired on .  Final Diagnoses: Acute respiratory failure Cardiac arrest Cardiogenic shock Anoxic encephalopathy Aspiration pneumonitis ARDS Respiratory acidosis Hx of HTN Hyponatremia Metabolic acidosis Hypokalemia Shock liver Hepatic steatosis Hyperglycemia Hx of Depression and anxiety  Coralyn HellingVineet Cosimo Schertzer, MD [facility] 10/28/2014, 9:07 AM

## 2014-11-01 ENCOUNTER — Ambulatory Visit: Payer: No Typology Code available for payment source | Admitting: Audiology

## 2014-11-02 ENCOUNTER — Telehealth: Payer: Self-pay | Admitting: Emergency Medicine

## 2014-11-02 NOTE — Telephone Encounter (Signed)
Spoke with [name]. Advised her that we could not take samples back once they have left the office. States that she will destroy medications herself. Nothing further was needed.

## 2014-11-02 NOTE — Telephone Encounter (Signed)
lmtcb x1 for [name].

## 2014-11-02 NOTE — Telephone Encounter (Signed)
Lillia AbedLindsay spoke to WintersAnn. Will route to her.

## 2014-11-03 ENCOUNTER — Ambulatory Visit: Payer: Self-pay

## 2014-11-04 ENCOUNTER — Ambulatory Visit: Payer: Self-pay

## 2014-11-04 ENCOUNTER — Ambulatory Visit: Payer: Self-pay | Admitting: Adult Health

## 2014-12-07 ENCOUNTER — Ambulatory Visit: Payer: Self-pay | Admitting: Neurology

## 2014-12-07 ENCOUNTER — Ambulatory Visit: Payer: Self-pay | Admitting: Family

## 2014-12-10 ENCOUNTER — Ambulatory Visit: Payer: Self-pay | Admitting: Neurology

## 2014-12-21 ENCOUNTER — Encounter: Payer: Self-pay | Admitting: Internal Medicine

## 2015-08-10 IMAGING — CT CT HEAD W/O CM
1 of 2 series · 13 of 30 positions shown, 17 images · non-contrast
Comparison: CT of the had on 08/23/2014

CLINICAL DATA: Status post cardiac arrest.  Anoxic encephalopathy.

EXAM:
CT HEAD WITHOUT CONTRAST
TECHNIQUE: Contiguous axial images were obtained from the base of the skull
through the vertex without intravenous contrast.

[Series 1: — · axial · 0.49mm/px · z∈[-567,-457]mm · 13 of 26 slices shown, 17 images]
[im 2/26  brain]
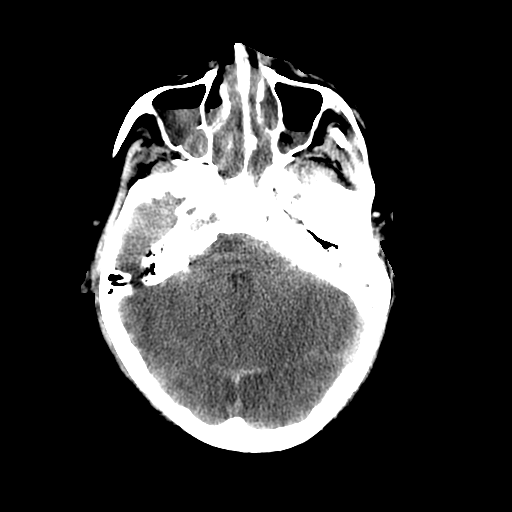
[im 2/26  bone]
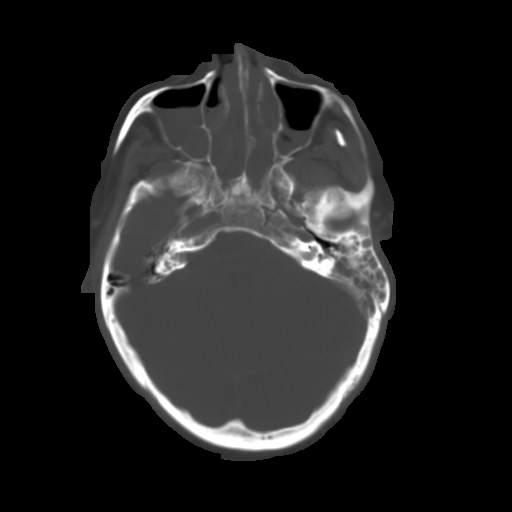
[im 4/26  brain]
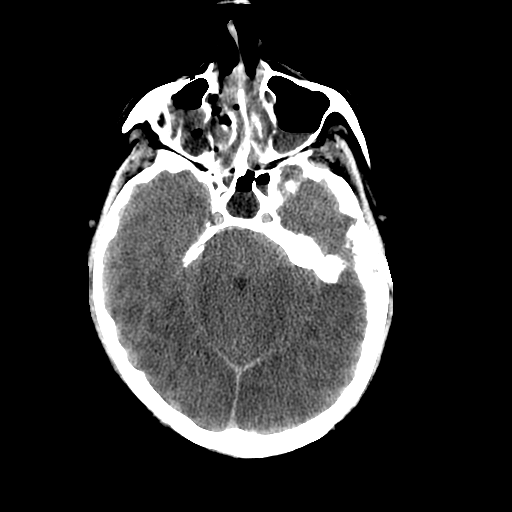
[im 6/26  brain]
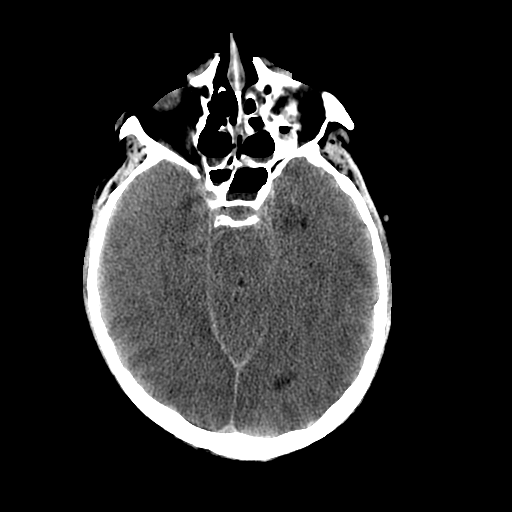
[im 8/26  brain]
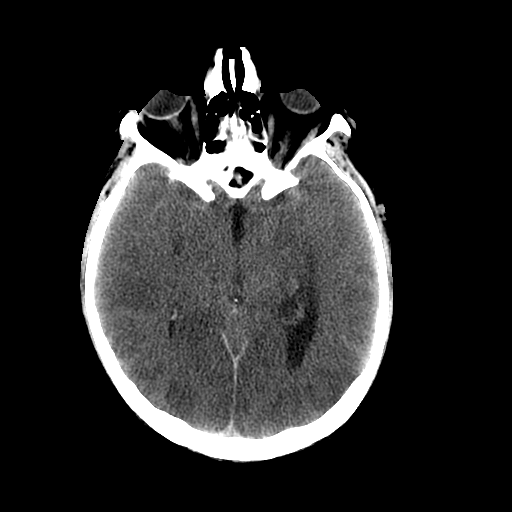
[im 9/26  brain]
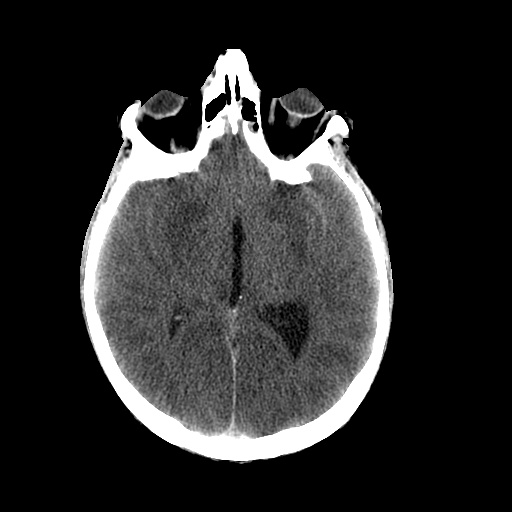
[im 9/26  bone]
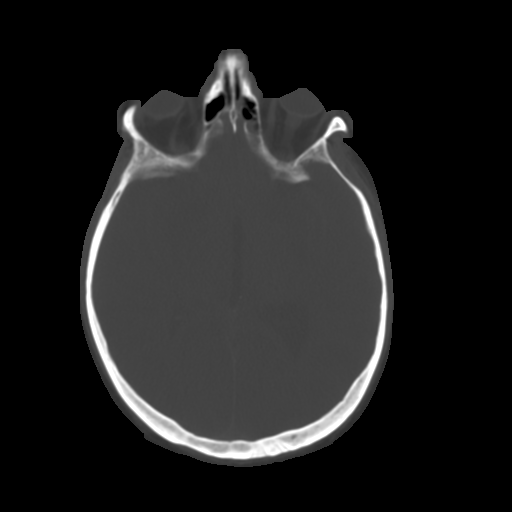
[im 11/26  brain]
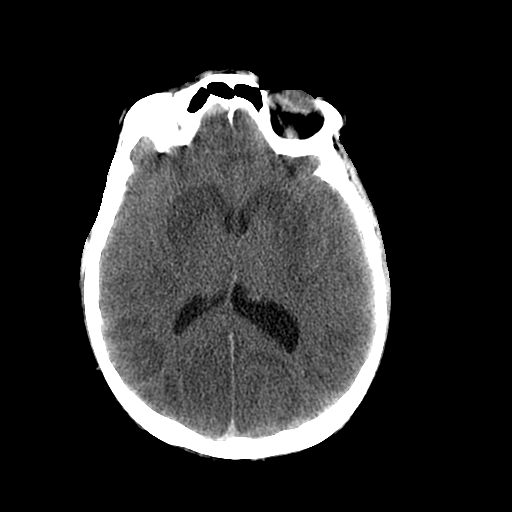
[im 13/26  brain]
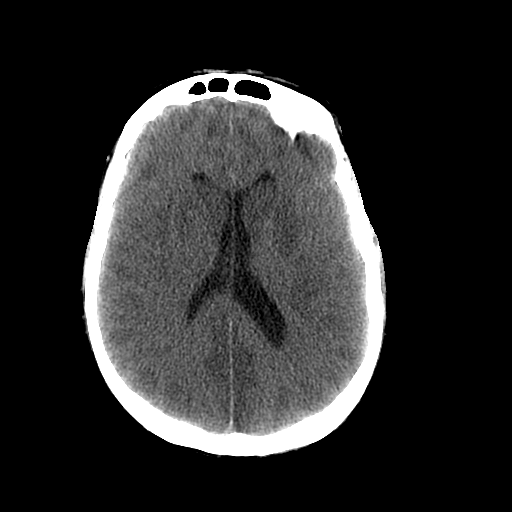
[im 15/26  brain]
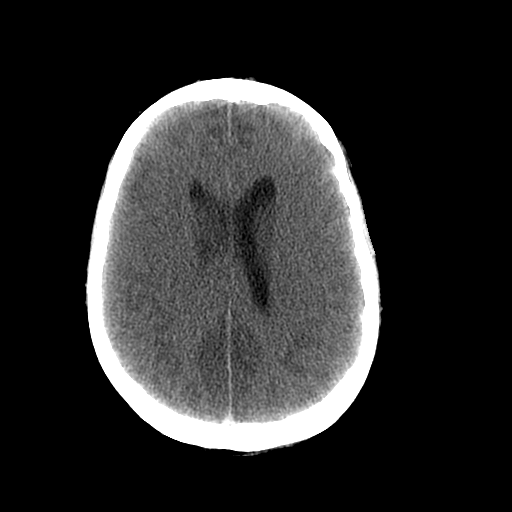
[im 17/26  brain]
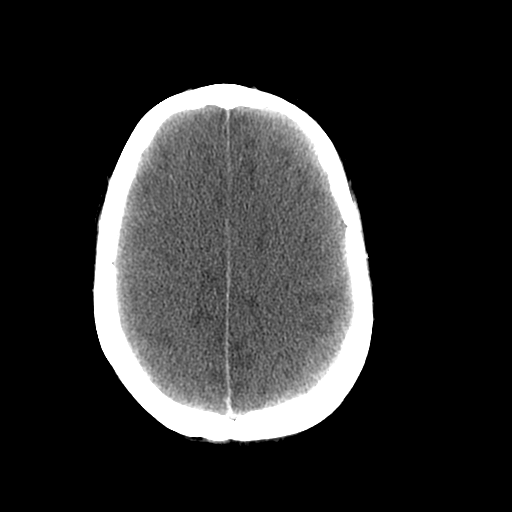
[im 17/26  bone]
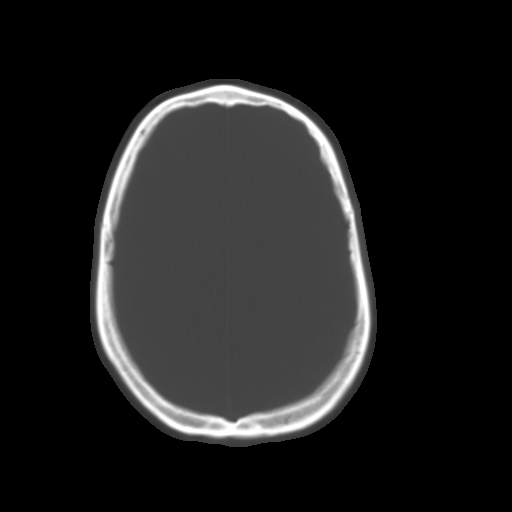
[im 18/26  brain]
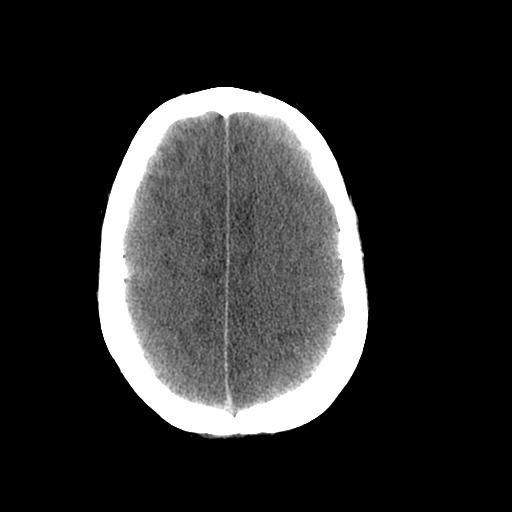
[im 20/26  brain]
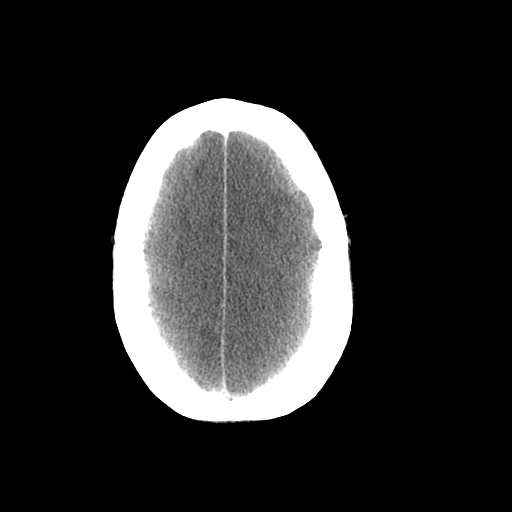
[im 22/26  brain]
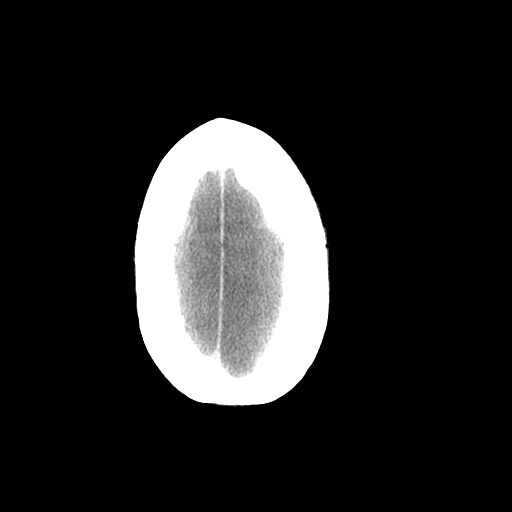
[im 24/26  brain]
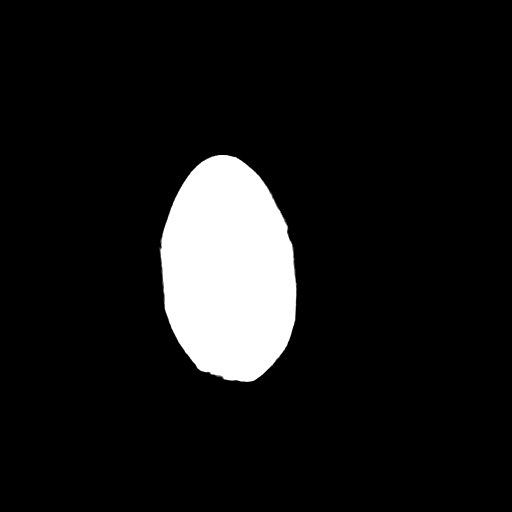
[im 24/26  bone]
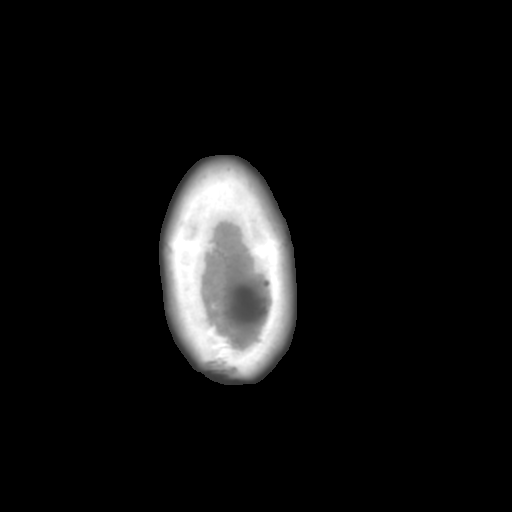

[13 of 30 positions shown; findings below may reference images not displayed]

FINDINGS: There is diffuse cerebral and cerebellar edema. There is effacement
of the cisterns and sulci. Lateral, third, and fourth ventricles are
also effaced. No evidence for hemorrhage. Appearance of "Pseudo
subarachnoid hemorrhage" likely artifactual due to marked edema.

Bone windows show air-fluid levels within the paranasal sinuses.
Left mastoid effusion. No calvarial fracture.

Because of patient body habitus and ventilation tubing, patient's
skull base could not be imaged.
IMPRESSION: 1. Marked cerebral and cerebellar edema.
2. Diffuse effacement of cisterns and sulci.
3. Critical Value/emergent results were called by telephone at the
time of interpretation on 10/23/2014 at [DATE] to Dr. LUIDE MILY ,
who verbally acknowledged these results.

## 2016-03-20 ENCOUNTER — Other Ambulatory Visit: Payer: Self-pay | Admitting: Internal Medicine

## 2019-04-17 ENCOUNTER — Other Ambulatory Visit: Payer: Self-pay
